# Patient Record
Sex: Male | Born: 1960 | Race: White | Hispanic: No | Marital: Married | Smoking: Never smoker
Health system: Southern US, Community
[De-identification: ages and names within clinical notes are randomized; demographics above are authoritative.]

## PROBLEM LIST (undated history)

## (undated) DIAGNOSIS — Z5189 Encounter for other specified aftercare: Secondary | ICD-10-CM

## (undated) DIAGNOSIS — Q231 Congenital insufficiency of aortic valve: Secondary | ICD-10-CM

## (undated) DIAGNOSIS — I10 Essential (primary) hypertension: Secondary | ICD-10-CM

## (undated) DIAGNOSIS — G4730 Sleep apnea, unspecified: Secondary | ICD-10-CM

## (undated) DIAGNOSIS — Z87442 Personal history of urinary calculi: Secondary | ICD-10-CM

## (undated) DIAGNOSIS — N183 Chronic kidney disease, stage 3 unspecified: Secondary | ICD-10-CM

## (undated) DIAGNOSIS — I480 Paroxysmal atrial fibrillation: Secondary | ICD-10-CM

## (undated) DIAGNOSIS — I491 Atrial premature depolarization: Secondary | ICD-10-CM

## (undated) DIAGNOSIS — R519 Headache, unspecified: Secondary | ICD-10-CM

## (undated) DIAGNOSIS — D649 Anemia, unspecified: Secondary | ICD-10-CM

## (undated) DIAGNOSIS — I499 Cardiac arrhythmia, unspecified: Secondary | ICD-10-CM

## (undated) DIAGNOSIS — I7789 Other specified disorders of arteries and arterioles: Secondary | ICD-10-CM

## (undated) DIAGNOSIS — K219 Gastro-esophageal reflux disease without esophagitis: Secondary | ICD-10-CM

## (undated) DIAGNOSIS — Z992 Dependence on renal dialysis: Secondary | ICD-10-CM

## (undated) DIAGNOSIS — E7800 Pure hypercholesterolemia, unspecified: Secondary | ICD-10-CM

## (undated) DIAGNOSIS — I350 Nonrheumatic aortic (valve) stenosis: Secondary | ICD-10-CM

## (undated) DIAGNOSIS — B019 Varicella without complication: Secondary | ICD-10-CM

## (undated) DIAGNOSIS — N186 End stage renal disease: Secondary | ICD-10-CM

## (undated) DIAGNOSIS — Q2381 Bicuspid aortic valve: Secondary | ICD-10-CM

## (undated) DIAGNOSIS — N200 Calculus of kidney: Secondary | ICD-10-CM

## (undated) DIAGNOSIS — I2510 Atherosclerotic heart disease of native coronary artery without angina pectoris: Secondary | ICD-10-CM

## (undated) HISTORY — DX: Nonrheumatic aortic (valve) stenosis: I35.0

## (undated) HISTORY — DX: Congenital insufficiency of aortic valve: Q23.1

## (undated) HISTORY — DX: Atrial premature depolarization: I49.1

## (undated) HISTORY — DX: Bicuspid aortic valve: Q23.81

## (undated) HISTORY — DX: Chronic kidney disease, stage 3 unspecified: N18.30

## (undated) HISTORY — DX: End stage renal disease: N18.6

## (undated) HISTORY — DX: Calculus of kidney: N20.0

## (undated) HISTORY — DX: Other specified disorders of arteries and arterioles: I77.89

## (undated) HISTORY — DX: Chronic kidney disease, stage 3 (moderate): N18.3

## (undated) HISTORY — DX: Anemia, unspecified: D64.9

## (undated) HISTORY — DX: Varicella without complication: B01.9

## (undated) HISTORY — DX: Encounter for other specified aftercare: Z51.89

## (undated) HISTORY — DX: Paroxysmal atrial fibrillation: I48.0

## (undated) HISTORY — DX: Dependence on renal dialysis: Z99.2

## (undated) HISTORY — DX: Atherosclerotic heart disease of native coronary artery without angina pectoris: I25.10

## (undated) HISTORY — DX: Pure hypercholesterolemia, unspecified: E78.00

## (undated) HISTORY — DX: Sleep apnea, unspecified: G47.30

---

## 2010-02-28 HISTORY — PX: CORONARY STENT PLACEMENT: SHX1402

## 2010-11-15 ENCOUNTER — Inpatient Hospital Stay (HOSPITAL_COMMUNITY)
Admission: EM | Admit: 2010-11-15 | Discharge: 2010-11-18 | DRG: 247 | Disposition: A | Discharge status: Home / Self Care | Payer: Managed Care, Other (non HMO) | Source: Ambulatory Visit | Attending: Cardiology | Admitting: Cardiology

## 2010-11-15 DIAGNOSIS — I2510 Atherosclerotic heart disease of native coronary artery without angina pectoris: Principal | ICD-10-CM | POA: Diagnosis present

## 2010-11-15 DIAGNOSIS — G4733 Obstructive sleep apnea (adult) (pediatric): Secondary | ICD-10-CM | POA: Diagnosis present

## 2010-11-15 DIAGNOSIS — E7800 Pure hypercholesterolemia, unspecified: Secondary | ICD-10-CM | POA: Diagnosis present

## 2010-11-15 DIAGNOSIS — I10 Essential (primary) hypertension: Secondary | ICD-10-CM | POA: Diagnosis present

## 2010-11-15 DIAGNOSIS — H409 Unspecified glaucoma: Secondary | ICD-10-CM | POA: Diagnosis present

## 2010-11-15 DIAGNOSIS — E876 Hypokalemia: Secondary | ICD-10-CM | POA: Diagnosis present

## 2010-11-15 DIAGNOSIS — M109 Gout, unspecified: Secondary | ICD-10-CM | POA: Diagnosis present

## 2010-11-15 HISTORY — DX: Essential (primary) hypertension: I10

## 2010-11-15 LAB — CBC
HCT: 45.7 % (ref 39.0–52.0)
Hemoglobin: 16.1 g/dL (ref 13.0–17.0)
MCHC: 35.2 g/dL (ref 30.0–36.0)
MCV: 93.1 fL (ref 78.0–100.0)
Platelets: 157 10*3/uL (ref 150–400)

## 2010-11-15 LAB — COMPREHENSIVE METABOLIC PANEL
Albumin: 3.8 g/dL (ref 3.5–5.2)
Alkaline Phosphatase: 82 U/L (ref 39–117)
BUN: 20 mg/dL (ref 6–23)
Calcium: 9.4 mg/dL (ref 8.4–10.5)
GFR calc Af Amer: >60 mL/min (ref >60)
Glucose, Bld: 119 mg/dL — ABNORMAL HIGH (ref 70–99)
Potassium: 3.9 mEq/L (ref 3.5–5.1)
Total Protein: 7.2 g/dL (ref 6.0–8.3)

## 2010-11-15 LAB — POCT I-STAT TROPONIN I: Troponin i, poc: 0.03 ng/mL (ref 0.00–0.08)

## 2010-11-16 ENCOUNTER — Observation Stay (HOSPITAL_COMMUNITY): Payer: Managed Care, Other (non HMO)

## 2010-11-16 ENCOUNTER — Encounter (HOSPITAL_COMMUNITY): Payer: Self-pay | Admitting: Radiology

## 2010-11-16 DIAGNOSIS — R079 Chest pain, unspecified: Secondary | ICD-10-CM

## 2010-11-16 LAB — TROPONIN I: Troponin I: <0.3 ng/mL (ref <0.30)

## 2010-11-16 LAB — POCT I-STAT TROPONIN I
Troponin i, poc: 0.02 ng/mL (ref 0.00–0.08)
Troponin i, poc: 0.03 ng/mL (ref 0.00–0.08)

## 2010-11-16 LAB — TSH: TSH: 2.277 u[IU]/mL (ref 0.350–4.500)

## 2010-11-16 LAB — PROTIME-INR: Prothrombin Time: 12.7 seconds (ref 11.6–15.2)

## 2010-11-16 MED ORDER — IOHEXOL 350 MG/ML SOLN
80.0000 mL | Freq: Once | INTRAVENOUS | Status: AC | PRN
  Administered 2010-11-16: 80 mL via INTRAVENOUS

## 2010-11-17 ENCOUNTER — Other Ambulatory Visit (HOSPITAL_COMMUNITY): Payer: Managed Care, Other (non HMO)

## 2010-11-17 DIAGNOSIS — I2510 Atherosclerotic heart disease of native coronary artery without angina pectoris: Secondary | ICD-10-CM

## 2010-11-17 DIAGNOSIS — I059 Rheumatic mitral valve disease, unspecified: Secondary | ICD-10-CM

## 2010-11-17 LAB — COMPREHENSIVE METABOLIC PANEL
AST: 22 U/L (ref 0–37)
Albumin: 3.5 g/dL (ref 3.5–5.2)
BUN: 21 mg/dL (ref 6–23)
Calcium: 8.9 mg/dL (ref 8.4–10.5)
Chloride: 104 mEq/L (ref 96–112)
Creatinine, Ser: 1.41 mg/dL — ABNORMAL HIGH (ref 0.50–1.35)
GFR calc non Af Amer: 53 mL/min — ABNORMAL LOW (ref >60)
Total Bilirubin: 0.3 mg/dL (ref 0.3–1.2)

## 2010-11-17 LAB — CARDIAC PANEL(CRET KIN+CKTOT+MB+TROPI)
Relative Index: INVALID (ref 0.0–2.5)
Total CK: 64 U/L (ref 7–232)
Troponin I: <0.3 ng/mL (ref <0.30)

## 2010-11-17 LAB — CBC
MCV: 93 fL (ref 78.0–100.0)
Platelets: 168 10*3/uL (ref 150–400)
RBC: 4.87 MIL/uL (ref 4.22–5.81)
RDW: 12.9 % (ref 11.5–15.5)
WBC: 6.4 10*3/uL (ref 4.0–10.5)

## 2010-11-17 LAB — LIPID PANEL
Cholesterol: 180 mg/dL (ref 0–200)
HDL: 30 mg/dL — ABNORMAL LOW (ref >39)
Triglycerides: 661 mg/dL — ABNORMAL HIGH (ref <150)
VLDL: UNDETERMINED mg/dL (ref 0–40)

## 2010-11-17 LAB — HEMOGLOBIN A1C
Hgb A1c MFr Bld: 5.7 % — ABNORMAL HIGH (ref <5.7)
Mean Plasma Glucose: 117 mg/dL — ABNORMAL HIGH (ref <117)

## 2010-11-18 DIAGNOSIS — R0989 Other specified symptoms and signs involving the circulatory and respiratory systems: Secondary | ICD-10-CM

## 2010-11-18 DIAGNOSIS — I200 Unstable angina: Secondary | ICD-10-CM

## 2010-11-18 LAB — BASIC METABOLIC PANEL
BUN: 17 mg/dL (ref 6–23)
CO2: 25 mEq/L (ref 19–32)
Calcium: 9.2 mg/dL (ref 8.4–10.5)
Chloride: 105 mEq/L (ref 96–112)
Creatinine, Ser: 1.23 mg/dL (ref 0.50–1.35)

## 2010-11-18 LAB — CBC
HCT: 46.3 % (ref 39.0–52.0)
MCH: 31.9 pg (ref 26.0–34.0)
MCV: 91.1 fL (ref 78.0–100.0)
RBC: 5.08 MIL/uL (ref 4.22–5.81)
WBC: 8.3 10*3/uL (ref 4.0–10.5)

## 2010-11-25 ENCOUNTER — Ambulatory Visit (INDEPENDENT_AMBULATORY_CARE_PROVIDER_SITE_OTHER): Payer: Managed Care, Other (non HMO) | Admitting: *Deleted

## 2010-11-25 DIAGNOSIS — I10 Essential (primary) hypertension: Secondary | ICD-10-CM

## 2010-11-26 ENCOUNTER — Telehealth: Payer: Self-pay | Admitting: *Deleted

## 2010-11-26 DIAGNOSIS — I10 Essential (primary) hypertension: Secondary | ICD-10-CM

## 2010-11-26 DIAGNOSIS — I2510 Atherosclerotic heart disease of native coronary artery without angina pectoris: Secondary | ICD-10-CM

## 2010-11-26 LAB — BASIC METABOLIC PANEL
BUN: 27 mg/dL — ABNORMAL HIGH (ref 6–23)
CO2: 25 mEq/L (ref 19–32)
Calcium: 8.8 mg/dL (ref 8.4–10.5)
Creatinine, Ser: 1.6 mg/dL — ABNORMAL HIGH (ref 0.4–1.5)
GFR: 49.64 mL/min — ABNORMAL LOW (ref >60.00)
Glucose, Bld: 92 mg/dL (ref 70–99)

## 2010-11-26 NOTE — Telephone Encounter (Signed)
Med change. See lab note.

## 2010-12-03 ENCOUNTER — Encounter: Payer: Self-pay | Admitting: Physician Assistant

## 2010-12-06 ENCOUNTER — Ambulatory Visit (INDEPENDENT_AMBULATORY_CARE_PROVIDER_SITE_OTHER): Payer: Managed Care, Other (non HMO) | Admitting: Pulmonary Disease

## 2010-12-06 ENCOUNTER — Encounter: Payer: Managed Care, Other (non HMO) | Admitting: Physician Assistant

## 2010-12-06 ENCOUNTER — Encounter: Payer: Self-pay | Admitting: Physician Assistant

## 2010-12-06 ENCOUNTER — Ambulatory Visit (INDEPENDENT_AMBULATORY_CARE_PROVIDER_SITE_OTHER): Payer: Managed Care, Other (non HMO) | Admitting: Physician Assistant

## 2010-12-06 ENCOUNTER — Encounter: Payer: Self-pay | Admitting: Pulmonary Disease

## 2010-12-06 VITALS — BP 128/78 | HR 46 | Temp 98.0°F | Ht 68.0 in | Wt 249.0 lb

## 2010-12-06 DIAGNOSIS — H409 Unspecified glaucoma: Secondary | ICD-10-CM | POA: Insufficient documentation

## 2010-12-06 DIAGNOSIS — I498 Other specified cardiac arrhythmias: Secondary | ICD-10-CM

## 2010-12-06 DIAGNOSIS — M109 Gout, unspecified: Secondary | ICD-10-CM | POA: Insufficient documentation

## 2010-12-06 DIAGNOSIS — R001 Bradycardia, unspecified: Secondary | ICD-10-CM | POA: Insufficient documentation

## 2010-12-06 DIAGNOSIS — N289 Disorder of kidney and ureter, unspecified: Secondary | ICD-10-CM

## 2010-12-06 DIAGNOSIS — E782 Mixed hyperlipidemia: Secondary | ICD-10-CM | POA: Insufficient documentation

## 2010-12-06 DIAGNOSIS — I10 Essential (primary) hypertension: Secondary | ICD-10-CM

## 2010-12-06 DIAGNOSIS — I2510 Atherosclerotic heart disease of native coronary artery without angina pectoris: Secondary | ICD-10-CM | POA: Insufficient documentation

## 2010-12-06 DIAGNOSIS — G4733 Obstructive sleep apnea (adult) (pediatric): Secondary | ICD-10-CM

## 2010-12-06 HISTORY — DX: Essential (primary) hypertension: I10

## 2010-12-06 NOTE — Progress Notes (Signed)
  Subjective:    Patient ID: Daniel Solomon, male    DOB: 11/25/1960, 50 y.o.   MRN: [id number]  HPI The patient is a 50 year old male who I've been asked to see for possible sleep apnea.  He was recently in the hospital for coronary disease, and is status post stenting.  While there, he had issues with witnessed apnea, and tells me that he "set off the alarms".  The patient has been noted to have loud snoring by his bed partner, and has had rare gasping arousals as well.  He has frequent awakenings at night, and does not feel rested in the mornings upon arising.  He has some sleep pressure with intactivity during the day, and feels that his overall alertness at work is not normal.  He has no issues with sleepiness in the evenings while watching television, but does have sleepiness issues with driving the [id number] hours to work each way.  The patient states that his weight is neutral the last 2 years, and his Epworth sleepiness score today is 11  Sleep Questionnaire: What time do you typically go to bed?( Between what hours) 9-11pm How long does it take you to fall asleep? 15 minutes How many times during the night do you wake up? 4 What time do you get out of bed to start your day? 0630 Do you drive or operate heavy machinery in your occupation? No How much has your weight changed (up or down) over the past two years? (In pounds) 0 oz (0 kg) Have you ever had a sleep study before? No Do you currently use CPAP? No Do you wear oxygen at any time? No    Review of Systems  Constitutional: Negative for fever and unexpected weight change.  HENT: Positive for sneezing. Negative for ear pain, nosebleeds, congestion, sore throat, rhinorrhea, trouble swallowing, dental problem, postnasal drip and sinus pressure.   Eyes: Negative for redness and itching.  Respiratory: Positive for cough. Negative for chest tightness, shortness of breath and wheezing.   Cardiovascular: Negative for palpitations and leg swelling.    Gastrointestinal: Negative for nausea and vomiting.  Genitourinary: Negative for dysuria.  Musculoskeletal: Negative for joint swelling.  Skin: Negative for rash.  Neurological: Negative for headaches.  Hematological: Does not bruise/bleed easily.  Psychiatric/Behavioral: Negative for dysphoric mood. The patient is not nervous/anxious.        Objective:   Physical Exam Constitutional:  Overweight male, no acute distress  HENT:  Nares patent without discharge  Oropharynx without exudate, palate and uvula are thick and elongated.  Small posterior   pharyngeal space  Eyes:  Perrla, eomi, no scleral icterus  Neck:  No JVD, no TMG  Cardiovascular:  Normal rate, regular rhythm, no rubs or gallops.  No murmurs        Intact distal pulses  Pulmonary :  Normal breath sounds, no stridor or respiratory distress   No rales, rhonchi, or wheezing  Abdominal:  Soft, nondistended, bowel sounds present.  No tenderness noted.   Musculoskeletal:  No lower extremity edema noted.  Lymph Nodes:  No cervical lymphadenopathy noted  Skin:  No cyanosis noted  Neurologic:  Awake, appears sleepy but appropriate, moves all 4 extremities without obvious deficit.         Assessment & Plan:

## 2010-12-06 NOTE — Assessment & Plan Note (Signed)
Repeat basic metabolic panel today.  He may need Lisinopril discontinued if his creatinine continues to rise.

## 2010-12-06 NOTE — Assessment & Plan Note (Signed)
He was evaluated by pulmonology today.

## 2010-12-06 NOTE — Assessment & Plan Note (Signed)
Controlled.  

## 2010-12-06 NOTE — Assessment & Plan Note (Signed)
Currently asymptomatic.  Decrease bisoprolol to 2.5 mg daily.

## 2010-12-06 NOTE — Progress Notes (Addendum)
History of Present Illness: Primary Cardiologist:  Dr. Jenell Milliner   Daniel Solomon is a 50 y.o. male who presents for post hospital follow up.  He has a history of hypertension and hyperlipidemia.  He was admitted 9/17-9/20.  He presented with chest and arm pain consistent with unstable angina.  Myocardial infarction was ruled out.  Cardiac CT angiogram in the emergency room demonstrated a very high calcium score with possible significant LAD and RCA stenoses.  He was referred for cardiac catheterization.  This was performed 9/19 and demonstrated proximal LAD 50%, mid LAD 50-60%, mid to distal LAD 99%; mid circumflex 20%; proximal RCA 20%, distal RCA 50%; PDA 60-70%, proximal posterior AV groove 50%, proximal posterior lateral 50%.  There was a question of possible LVOT gradient.  Decision was made to proceed with PCI of the mid to distal LAD with a Resolute DES.  Followup echocardiogram 11/17/10: EF 55-60%, mild LVH, grade 1 diastolic dysfunction, normal aortic valve, mild MR, PASP 32.  He was noted to have nocturnal bradycardia and was set up for outpatient sleep medicine evaluation for possible sleep apnea.  His triglycerides were over 600.  Lipitor was added to his medications which included Lopid.  He had some renal insufficiency with creatinine of 1.43 upon presentation.  Repeat basic metabolic panel 0000000 post discharge demonstrated a potassium of 4.9 and a creatinine of 1.6.  Of note, hemoglobin A1c was 5.7 in the hospital and TSH was 2.277 and hemoglobin was 16.2.  The patient denies chest pain, shortness of breath, syncope, orthopnea, PND or significant pedal edema.  He saw Dr. Gwenette Greet today and has a sleep test scheduled soon.  He starts cardiac rehab next week.   Past Medical History  Diagnosis Date  . Hypertension   . Hypercholesterolemia     With hypertriglyceridemia  . Glaucoma   . Kidney stones   . Gout   . CAD (coronary artery disease)     proximal LAD 50%, mid LAD 50-60%, mid to distal  LAD 99%; mid circumflex 20%; proximal RCA 20%, distal RCA 50%; PDA 60-70%, proximal posterior AV groove 50%, proximal posterior lateral 50%.  There was a question of possible LVOT gradient;  s/p Resolute DES to mid-distal LAD 10/2010;  echocardiogram 11/17/10: EF 55-60%, mild LVH, grade 1 diastolic dysfunction, normal aortic valve, mild MR, PASP 32   . Renal insufficiency     Current Outpatient Prescriptions  Medication Sig Dispense Refill  . allopurinol (ZYLOPRIM) 300 MG tablet Take 300 mg by mouth daily.        [name] aspirin 81 MG tablet Take 81 mg by mouth daily.        [name] atorvastatin (LIPITOR) 40 MG tablet Take 40 mg by mouth daily.        . bimatoprost (LUMIGAN) 0.03 % ophthalmic solution Place 1 drop into both eyes at bedtime.        . bisoprolol (ZEBETA) 5 MG tablet Take 1/2 tablet daily      . gemfibrozil (LOPID) 600 MG tablet Take 600 mg by mouth 2 (two) times daily before a meal.        . lisinopril (PRINIVIL,ZESTRIL) 10 MG tablet Take 1/2 tablet by mouth once daily.      . prasugrel (EFFIENT) 10 MG TABS Take 10 mg by mouth daily.        . timolol (TIMOPTIC-XR) 0.25 % ophthalmic gel-forming 1 drop daily.        [name] DISCONTD: bisoprolol (ZEBETA) 5 MG tablet Take  5 mg by mouth daily.          Allergies: No Known Allergies  Social history:  Nonsmoker  Vital Signs: BP 122/80  Pulse 48  Ht 5\' 8"  (1.727 m)  Wt 248 lb 8 oz (112.719 kg)  BMI 37.78 kg/m2  PHYSICAL EXAM: Well nourished, well developed, in no acute distress HEENT: normal Neck: no JVD Vascular: No carotid bruits Cardiac:  normal S1, S2; RRR; 1/6 systolic murmur Along left sternal border Lungs:  clear to auscultation bilaterally, no wheezing, rhonchi or rales Abd: soft, nontender, no hepatomegaly Ext: no edema; Right radial site without hematoma or bruit Skin: warm and dry Neuro:  CNs 2-12 intact, no focal abnormalities noted Psych: Normal affect  EKG:  Sinus bradycardia, rate 44, normal axis, no ischemic  changes  ASSESSMENT AND PLAN:

## 2010-12-06 NOTE — Patient Instructions (Signed)
Decrease bisoprolol to 2.5mg  daily--this will be one-half of a 5mg  tablet daily.  Your physician recommends that you have lab work today--BMP 414.01  593.9  401.1  272.4  Your physician recommends that you return for a FASTING lipid profile/liver profile in 6 weeks---414.01  593.9  401.1  272.4  Your physician wants you to follow-up in: 3 months with Dr Verl Blalock. (January 2013). You will receive a reminder letter in the mail two months in advance. If you don't receive a letter, please call our office to schedule the follow-up appointment.

## 2010-12-06 NOTE — Assessment & Plan Note (Signed)
The patient's history is very suggestive of obstructive sleep apnea.  He has loud snoring at night, witnessed abnormal breathing pattern during sleep, frequent awakenings at night with nonrestorative sleep, and also daytime sleep pressure with periods of inactivity.  I have had a long discussion with the pt about sleep apnea, including its impact on QOL and CV health.  I have recommended proceeding with a sleep study, and the patient is agreeable.

## 2010-12-06 NOTE — Patient Instructions (Signed)
Will schedule for sleep study Will arrange followup to discuss results once available.  Work on weight reduction.

## 2010-12-06 NOTE — Assessment & Plan Note (Signed)
Triglycerides extremely high in the hospital.  He is now on Lipitor and gemfibrozil.  Followup lipids and LFTs in 6 weeks.

## 2010-12-06 NOTE — Assessment & Plan Note (Signed)
Overall stable post PCI.  Continue aspirin and Effient and statin.  He starts cardiac rehabilitation next week.  Followup with Dr. Verl Blalock in 3 months.

## 2010-12-07 LAB — BASIC METABOLIC PANEL
BUN: 24 mg/dL — ABNORMAL HIGH (ref 6–23)
Chloride: 110 mEq/L (ref 96–112)
Creatinine, Ser: 1.5 mg/dL (ref 0.4–1.5)

## 2010-12-13 ENCOUNTER — Encounter (HOSPITAL_COMMUNITY): Payer: Managed Care, Other (non HMO) | Attending: Cardiology

## 2010-12-13 ENCOUNTER — Encounter (HOSPITAL_COMMUNITY): Payer: Managed Care, Other (non HMO)

## 2010-12-13 DIAGNOSIS — Z5189 Encounter for other specified aftercare: Secondary | ICD-10-CM | POA: Insufficient documentation

## 2010-12-13 DIAGNOSIS — M109 Gout, unspecified: Secondary | ICD-10-CM | POA: Insufficient documentation

## 2010-12-13 DIAGNOSIS — I2510 Atherosclerotic heart disease of native coronary artery without angina pectoris: Secondary | ICD-10-CM | POA: Insufficient documentation

## 2010-12-13 DIAGNOSIS — Z9861 Coronary angioplasty status: Secondary | ICD-10-CM | POA: Insufficient documentation

## 2010-12-13 DIAGNOSIS — G4733 Obstructive sleep apnea (adult) (pediatric): Secondary | ICD-10-CM | POA: Insufficient documentation

## 2010-12-13 DIAGNOSIS — E7800 Pure hypercholesterolemia, unspecified: Secondary | ICD-10-CM | POA: Insufficient documentation

## 2010-12-13 DIAGNOSIS — H409 Unspecified glaucoma: Secondary | ICD-10-CM | POA: Insufficient documentation

## 2010-12-13 DIAGNOSIS — I10 Essential (primary) hypertension: Secondary | ICD-10-CM | POA: Insufficient documentation

## 2010-12-14 NOTE — Cardiovascular Report (Signed)
NAME:  Daniel Solomon, Daniel Solomon NO.:  [id number]  MEDICAL RECORD NO.:  [id number]  LOCATION:  2507                         FACILITY:  [facility]  PHYSICIAN:  Kathlyn Sacramento, MD     DATE OF BIRTH:  1960-11-25  DATE OF PROCEDURE: DATE OF DISCHARGE:                           CARDIAC CATHETERIZATION   REFERRING PHYSICIAN:  Marcello Moores C. Wall, MD, [facility]  PROCEDURES PERFORMED: 1. Left heart catheterization 2. Coronary angiography. 3. Left anterior descending artery angioplasty and drug-eluting stent     placement to a 99% stenosis in the mid to distal location resulting     in 0% residual stenosis.  INDICATIONS AND CLINICAL HISTORY:  This is a 50 year old gentleman with past medical history of hyperlipidemia and family history of premature coronary artery disease, who presented with substernal chest tightness with activities.  He had a CTA of the chest which showed evidence of LAD and RCA disease, and thus was referred for cardiac catheterization. Risks, benefits and alternatives were discussed with the patient.  ACCESS:  Right radial artery with 5-French sheath.  STUDY DETAILS:  A standard informed consent was obtained.  The right radial area was prepped in a sterile fashion.  It was anesthetized with 1% lidocaine.  A 5-French sheath was placed in the right radial artery after anterior puncture.  Verapamil 3 mg was given through the sheath. Unfractionated heparin 5000 units was given intravenously.  Coronary angiography was performed with a Tig catheter which could not engage the right coronary artery.  Right Judkins catheter was used for that purpose.  The catheter also was used to cross the aortic valve and record pressure.  No left ventricular angiography was performed.  INTERVENTIONAL PROCEDURE NOTE:  The patient was noted to have significant spasm in the radial artery resulting in difficulty in torquing the catheters.  Thus, he was given more verapamil and nitroglycerin  through the sheath as well as more sedation.  Even with that, there was still significant difficulty manipulating the guiding catheter.  I decided to stay with a 5-French sheath system.  I used an EBU 3.5 guiding catheter.  The lesion in the left anterior descending artery was crossed with an intuition wire.  Before that, the patient was given 60 mg of Effient and was started an IV bivalirudin with therapeutic ACT.  The lesion was predilated with a 2.0 x 15 mm Sprinter balloon to 8 atmospheres and then 10 atmospheres proximally.  I then placed a 2.25 x 18 mm resolute integrity stent which was deployed to 14 atmospheres.  This was postdilated with an [place] sprinter 2.5 x 12 mm to 12 atmospheres x2 inflations.  Angiography showed excellent results.  The wire was removed.  The guiding catheter was removed over the wire.  The sheath was removed and a TR band was applied.  There was no immediate complications.  STUDY FINDINGS:  Hemodynamic findings:  Left ventricular pressure is 151/9 with a left ventricular end-diastolic pressure of 12 mmHg.  Aortic pressure of 142/80 with a mean pressure of 106 mmHg.  There was a mild gradient noted across the aortic valve which could not be determined whether it was due to an LVOT gradient.  Left ventricle angiography was not performed due to slightly elevated creatinine.  Coronary angiography: 1. Left main coronary artery:  The vessel is normal in size and free     of significant disease. 2. Left anterior descending artery:  The vessel is normal in size with     disease noted throughout its course.  Proximally, just before the     first septal branch, there is 50% stenosis.  In the mid segment     right after second diagonal, there is another 50%-60% eccentric     lesion.  In the mid-to-distal LAD before the third diagonal, there     is a 99% tubular stenosis.  First diagonal is overall small in size     and free of significant disease.  Second diagonal  is normal in size     and free of significant disease.  Third diagonal is very small in     size. 3. Left circumflex artery:  The vessel is normal in size but     nondominant.  It has minor irregularities without obstructive     disease.  There is a 20% lesion in the mid segment right after OM-     1. 4. Right coronary artery:  The vessel is very large in size and     dominant.  There is a 20% proximal stenosis.  There is minor     irregularities in the mid segment.  Distally, right at PDA and the     AV groove bifurcation, there is a 50% stenosis.  The PDA itself is     medium in size and diffusely diseased throughout its course with     60%-70% disease noted with plaque formation.  The distal area is     too small.  The posterior AV groove has a 50% proximal lesion and     it gives a large posterolateral branch which has a 50% proximal     stenosis.  The second posterolateral branch is small in size and     free of significant disease.  STUDY CONCLUSION: 1. Significant two-vessel coronary artery disease. 2. Normal LV end-diastolic pressure with mild gradient noted across     the aortic valve. 3. Successful angioplasty and drug-eluting stent placement to the mid-     to-distal LAD with placement of 2.25 x 18 mm resolute integrity     drug-eluting stent.  RECOMMENDATIONS: 1. I recommend an echocardiogram to evaluate LV systolic function and     to see if there is true gradient across the aortic valve or the     LVOT. 2. I recommend dual antiplatelet therapy with aspirin and Effient for     at least 1 year. 3. Aggressive treatment of risk factors with high-dose statin is     recommended to treat his residual coronary artery disease.  No     further revascularization is required at this time, but obviously     the patient has diffuse coronary artery disease.     Kathlyn Sacramento, MD     MA/MEDQ  D:  11/17/2010  T:  11/17/2010  Job:  [id number]  Electronically Signed by Kathlyn Sacramento MD on 12/14/2010 12:25:51 PM

## 2010-12-15 ENCOUNTER — Encounter (HOSPITAL_COMMUNITY): Payer: Managed Care, Other (non HMO)

## 2010-12-16 ENCOUNTER — Ambulatory Visit (INDEPENDENT_AMBULATORY_CARE_PROVIDER_SITE_OTHER): Payer: Managed Care, Other (non HMO) | Admitting: Pulmonary Disease

## 2010-12-16 DIAGNOSIS — G4733 Obstructive sleep apnea (adult) (pediatric): Secondary | ICD-10-CM

## 2010-12-17 ENCOUNTER — Encounter (HOSPITAL_COMMUNITY): Payer: Managed Care, Other (non HMO)

## 2010-12-19 NOTE — H&P (Signed)
NAME:  Daniel Solomon, Daniel Solomon NO.:  [id number]  MEDICAL RECORD NO.:  [id number]  LOCATION:  2507                         FACILITY:  [facility]  PHYSICIAN:  Marijo Conception. Sahvannah Rieser, MD, FACCDATE OF BIRTH:  12/08/1960  DATE OF ADMISSION:  11/15/2010 DATE OF DISCHARGE:                             HISTORY & PHYSICAL   PRIMARY CARE PHYSICIAN:  Domenick Gong, MD  PRIMARY CARDIOLOGIST:  None.  CHIEF COMPLAINT:  Chest pain.  HISTORY OF PRESENT ILLNESS:  Daniel Solomon is a 50 year old male with no previous history of coronary artery disease.  He has multiple cardiac risk factors.  On the day of admission, he had 3 episodes of substernal chest pain.  Each one started with exertion.  The pain radiated down both arms.  It was described as a pressure.  He denies shortness of breath, nausea, vomiting or diaphoresis.  His symptoms were relieved by rest each time in less than 15 minutes.  He came to the emergency room because of the multiple episodes.  His cardiac enzymes were negative for MI.  Initially, a stress echocardiogram was planned but his blood pressure was significantly elevated and he had been given a beta- blocker, so it was not felt the most appropriate test.  The cardiac CT was performed which showed possible significant coronary artery disease, so Cardiology was asked to evaluate him.  Daniel Solomon has had no episodes of chest pain that started at rest.  Currently, he is resting comfortably.  PAST MEDICAL HISTORY: 1. Hypertension. 2. Hyperlipidemia. 3. History of glaucoma. 4. History of nephrolithiasis. 5. Gout.  SURGICAL HISTORY:  None known.  ALLERGIES:  No known drug allergies.  CURRENT MEDICATIONS: 1. Allopurinol 300 mg daily. 2. Gemfibrozil 600 mg b.i.d. 3. Zocor 40 mg a day. 4. Timolol eye drops daily. 5. Ziac 5/6.25 daily.  SOCIAL HISTORY:  He lives in [place] with his wife.  He works as a Scientist, research (physical sciences) at [employer].  He has no history of alcohol, tobacco or  drug abuse.  He walks for exercise at least  for about 15-20 minutes and eats a reasonably heart-healthy diet.  FAMILY HISTORY:  His mother is alive at 53 with a history of valvular disease.  His father is alive at 92 with a history of bypass surgery and no siblings have cardiac issues.  REVIEW OF SYSTEMS:  He has not had any recent illnesses, fevers, or chills.  The chest pain is described above.  He has had a nonproductive cough for a couple of weeks that is not completely gone but denies wheezing.  He denies orthopnea, PND, or edema.  He has not had palpitations.  He has occasional arthralgias.  He denies reflux symptoms or melena.  Full 14-point review of systems is otherwise negative except as stated in the HPI.  PHYSICAL EXAMINATION:  VITAL SIGNS:  Temperature is 98.4, initial blood pressure 197/103, now 147/75, heart rate 44, respiratory rate 20, O2 saturation 95% on room air. GENERAL:  He is a well-developed, well-nourished white male in no acute distress. HEENT:  Normal. NECK:  There is no lymphadenopathy, thyromegaly or JVD noted, but he has a right carotid bruit noted. CV:  His heart  is regular in rate and rhythm with an S1-S2 and no significant murmur, rub or gallop is noted.  Distal pulses are intact in all four extremities. LUNGS:  Clear to auscultation bilaterally. SKIN:  No rashes or lesions are noted. ABDOMEN:  Soft and nontender with active bowel sounds. EXTREMITIES:  There is no cyanosis, clubbing or edema noted. MUSCULOSKELETAL:  There is no joint deformity or effusions and no spine or CVA tenderness. NEURO:  He is alert and oriented.  Cranial nerves II-XII are grossly intact.  IMAGING:  Cardiac CT shows potentially obstructive coronary artery disease with a calcium score of 142 and potentially hemodynamically significant stenosis within the LAD and distal RCA.  EKG is sinus bradycardia rate 44 with no acute ischemic changes.  LABORATORY VALUES:   Hemoglobin 16.1, hematocrit 45.7, WBCs 5.9, platelets 157.  Sodium 143, potassium 3.9, chloride 105, CO2 30, BUN 20, creatinine 1.43, glucose 119.  Point-of-care troponin is negative x3.  IMPRESSION:  Daniel Solomon was seen today by Dr. Verl Blalock, the patient evaluated and the data reviewed.  Daniel Solomon has exertional chest pain consistent with angina.  Cardiac CT showed significant coronary artery disease in the left anterior descending and distal right coronary artery.  PLAN:  Hydrate with normal saline overnight and recheck a BUN and creatinine in a.m. because of the contrast load from the cardiac CT.  If his BUN and creatinine are stable, we will do a heart catheterization with cors only and check a 2-D echocardiogram for left ventricular function.  Dr. Verl Blalock discussed the plan with the patient and his wife including the risks and benefits of the procedure and they agreed to proceed.     Rosaria Ferries, PA-C   ______________________________ Marijo Conception Verl Blalock, MD, [facility]    RB/MEDQ  D:  11/16/2010  T:  11/17/2010  Job:  [id number]  Electronically Signed by Rosaria Ferries PA-C on 12/09/2010 06:38:09 AM Electronically Signed by Jenell Milliner MD [facility] on 12/19/2010 01:46:39 PM

## 2010-12-19 NOTE — Discharge Summary (Signed)
NAME:  Daniel Solomon, Daniel Solomon NO.:  [id number]  MEDICAL RECORD NO.:  [id number]  LOCATION:  2507                         FACILITY:  [facility]  PHYSICIAN:  Marijo Conception. Hamed Debella, MD, FACCDATE OF BIRTH:  1960/12/16  DATE OF ADMISSION:  11/15/2010 DATE OF DISCHARGE:  11/18/2010                              DISCHARGE SUMMARY   PRIMARY CARDIOLOGIST:  Marcello Moores C. Stephenson Cichy, MD, [facility].  PRIMARY CARE PROVIDER:  Franky Macho  DISCHARGE DIAGNOSIS:  Unstable angina.  SECONDARY DIAGNOSES: 1. Coronary artery disease status post drug-eluting stent placement of     the mid-to-distal left anterior descending artery this admission. 2. Hypertension. 3. Hyperlipidemia. 4. Hypertriglyceridemia. 5. Glaucoma. 6. Gout. 7. History of nephrolithiasis. 8. Hypokalemia on presentation. 9. Probable sleep apnea with outpatient workup pending.  ALLERGIES:  No known drug allergies.  PROCEDURES: 1. Left heart cardiac catheterization performed on November 16, 2010,     revealing a 99% stenosis in the mid-to-distal LAD with otherwise     nonobstructive disease.  The LAD was successfully stented using a     2.25 x 18-mm Resolute Integrity drug-eluting stent. 2. A 2-D echocardiogram on November 17, 2010; EF 55%-60% with mild     LVH, normal Meli Faley motion, and grade 1 diastolic dysfunction.  Mild     mitral regurgitation.  PASP was 32 mmHg.  HISTORY OF PRESENT ILLNESS:  This is a 50 year old male with the above problem list, who was in his usual state of health until the day of admission when he had 3 episodes of exertional chest discomfort with bilateral arm radiation with each episode lasting approximately 15 minutes resolved with rest.  He presented to the [facility] ED where his cardiac enzymes were negative.  He was felt to be a low risk by ER provider standpoint and was enrolled in a chest pain protocol, which called for cardiac CT angio of his chest showing significant, potentially obstructive coronary  artery disease involving the LAD and distal right coronary artery.  Total calcium score is 142.  Over this finding, [facility] was called for evaluation and admission.  HOSPITAL COURSE:  The patient ruled out for MI.  No further chest discomfort.  Decision was made to pursue diagnostic catheterization which took place on November 17, 2010, revealing a 99% stenosis in the distal LAD, otherwise moderate nonobstructive CAD including multiple 50%- 60% stenoses throughout the LAD and distal right coronary artery as well as diffuse 60%-70% stenosis in the right PDA.  The LAD was felt to be the culprit vessel.  This was successfully stented with a 2.25 x 18-mm Resolute Integrity drug-eluting stent.  The patient tolerated this procedure well.  Postprocedure, she was noted to be hypertensive, requiring p.r.n. IV hydralazine.  With lowering of blood pressure to the 130 range (from 170s-180s), the patient did experience some nausea and diaphoresis, which subsequently resolved with IV Zofran.  We have added moderate-dose lisinopril to his home regimen of bisoprolol, and we will plan to continue to titrate his medications as needed for hypertension in the outpatient setting.  It was noted during this admission that the patient exhibited nocturnal bradycardia.  Upon questioning, his wife reported a history of  snoring and the patient also noted frequent spells of waking up suddenly feeling as though he cannot catch his breath.  We felt that based on these symptoms along with the patient's body habitus, it was likely that the patient has sleep apnea.  We have arranged for followup with Dr. Gwenette Greet in Pulmonology/Sleep Clinic on December 06, 2010.  DISCHARGE LABORATORY DATA:  Hemoglobin 16.2, hematocrit 46.3, WBC 8.3, platelets 158.  Sodium 140, potassium 3.4 (replaced prior to discharge), chloride 105, CO2 of 25, BUN 17, creatinine 1.23, glucose 109.  Total bilirubin 0.3, alkaline phosphatase  82, AST 22, ALT 30, total protein 6.5, albumin 3.5, calcium 9.2.  Hemoglobin A1c 5.7.  CK 64, MB 2.2, troponin I less than 0.30, total cholesterol 180, triglycerides 661, HDL 30, LDL not calculated.  TSH 2.27.  DISPOSITION:  The patient will be discharged home today in good condition.  FOLLOWUP PLANS AND APPOINTMENTS:  The patient will have a basic metabolic panel checked on November 25, 2010, at [facility] secondary to initiation of ACE inhibitor therapy.  He has a followup with Richardson Dopp, PA on December 06, 2010, at 11 a.m. and then followup with Dr. Gwenette Greet on the same day at 1:30 p.m.  He will follow up with his primary care provider as previously scheduled.  DISCHARGE MEDICATIONS: 1. Aspirin 81 mg daily. 2. Bisoprolol 5 mg daily. 3. Lipitor 40 mg at bedtime. 4. Lisinopril 10 mg daily. 5. Nitroglycerin 0.4 mg subcu p.r.n. chest pain. 6. Prilosec 10 mg daily. 7. Allopurinol 300 mg q.p.m. 8. Gemfibrozil 600 mg b.i.d. 9. Lumigan 1 drop both eyes at bedtime. 10.Timolol 0.5% 1 drop both eyes daily.  OUTSTANDING LABS AND STUDIES:  A followup BMET in 1 week and lipids and LFTs in 6-8 weeks.  Followup sleep study at the discretion of Dr. Gwenette Greet.  DURATION OF DISCHARGE ENCOUNTER:  Sixty minutes including physician time.     Murray Hodgkins, ANP   ______________________________ Marijo Conception. Verl Blalock, MD, [facility]    CB/MEDQ  D:  11/18/2010  T:  11/18/2010  Job:  [id number]  cc:   Linward Headland, MD,FCCP  Electronically Signed by Murray Hodgkins ANP on 11/23/2010 04:20:20 PM Electronically Signed by Jenell Milliner MD [facility] on 12/19/2010 01:46:37 PM

## 2010-12-20 ENCOUNTER — Encounter (HOSPITAL_COMMUNITY): Payer: Managed Care, Other (non HMO)

## 2010-12-22 ENCOUNTER — Encounter (HOSPITAL_COMMUNITY): Payer: Managed Care, Other (non HMO)

## 2010-12-24 ENCOUNTER — Telehealth: Payer: Self-pay | Admitting: *Deleted

## 2010-12-24 ENCOUNTER — Encounter (HOSPITAL_COMMUNITY): Payer: Managed Care, Other (non HMO)

## 2010-12-24 NOTE — Telephone Encounter (Signed)
Pt needs ov with KC to discuss sleep study results LMOM for pt TCB

## 2010-12-27 ENCOUNTER — Encounter (HOSPITAL_COMMUNITY): Payer: Managed Care, Other (non HMO)

## 2010-12-27 NOTE — Telephone Encounter (Signed)
ATC pt x 2.  Phone rang several times and then hung up.  WCB

## 2010-12-28 NOTE — Telephone Encounter (Signed)
LMOMTCB

## 2010-12-28 NOTE — Telephone Encounter (Signed)
Pt appt w/ [facility] all ready set for 11/19.  Pt happy with date.  Satira Anis

## 2010-12-29 ENCOUNTER — Encounter (HOSPITAL_COMMUNITY): Payer: Managed Care, Other (non HMO)

## 2010-12-31 ENCOUNTER — Encounter (HOSPITAL_COMMUNITY): Payer: Managed Care, Other (non HMO)

## 2010-12-31 DIAGNOSIS — G4733 Obstructive sleep apnea (adult) (pediatric): Secondary | ICD-10-CM | POA: Insufficient documentation

## 2010-12-31 DIAGNOSIS — Z5189 Encounter for other specified aftercare: Secondary | ICD-10-CM | POA: Insufficient documentation

## 2010-12-31 DIAGNOSIS — I2510 Atherosclerotic heart disease of native coronary artery without angina pectoris: Secondary | ICD-10-CM | POA: Insufficient documentation

## 2010-12-31 DIAGNOSIS — E7800 Pure hypercholesterolemia, unspecified: Secondary | ICD-10-CM | POA: Insufficient documentation

## 2010-12-31 DIAGNOSIS — H409 Unspecified glaucoma: Secondary | ICD-10-CM | POA: Insufficient documentation

## 2010-12-31 DIAGNOSIS — Z9861 Coronary angioplasty status: Secondary | ICD-10-CM | POA: Insufficient documentation

## 2010-12-31 DIAGNOSIS — I10 Essential (primary) hypertension: Secondary | ICD-10-CM | POA: Insufficient documentation

## 2010-12-31 DIAGNOSIS — M109 Gout, unspecified: Secondary | ICD-10-CM | POA: Insufficient documentation

## 2011-01-03 ENCOUNTER — Encounter (HOSPITAL_COMMUNITY): Payer: Managed Care, Other (non HMO)

## 2011-01-05 ENCOUNTER — Encounter (HOSPITAL_COMMUNITY): Payer: Managed Care, Other (non HMO)

## 2011-01-07 ENCOUNTER — Encounter (HOSPITAL_COMMUNITY): Payer: Managed Care, Other (non HMO)

## 2011-01-10 ENCOUNTER — Encounter (HOSPITAL_COMMUNITY): Payer: Managed Care, Other (non HMO)

## 2011-01-12 ENCOUNTER — Encounter (HOSPITAL_COMMUNITY): Payer: Managed Care, Other (non HMO)

## 2011-01-14 ENCOUNTER — Encounter (HOSPITAL_COMMUNITY)
Admission: RE | Admit: 2011-01-14 | Discharge: 2011-01-14 | Disposition: A | Discharge status: Home / Self Care | Payer: Managed Care, Other (non HMO) | Source: Ambulatory Visit | Attending: Cardiology | Admitting: Cardiology

## 2011-01-17 ENCOUNTER — Encounter: Payer: Self-pay | Admitting: Pulmonary Disease

## 2011-01-17 ENCOUNTER — Ambulatory Visit (INDEPENDENT_AMBULATORY_CARE_PROVIDER_SITE_OTHER): Payer: Managed Care, Other (non HMO) | Admitting: Pulmonary Disease

## 2011-01-17 ENCOUNTER — Encounter (HOSPITAL_COMMUNITY)
Admission: RE | Admit: 2011-01-17 | Discharge: 2011-01-17 | Disposition: A | Discharge status: Home / Self Care | Payer: Managed Care, Other (non HMO) | Source: Ambulatory Visit | Attending: Cardiology | Admitting: Cardiology

## 2011-01-17 ENCOUNTER — Other Ambulatory Visit (INDEPENDENT_AMBULATORY_CARE_PROVIDER_SITE_OTHER): Payer: Managed Care, Other (non HMO) | Admitting: *Deleted

## 2011-01-17 VITALS — BP 132/82 | HR 51 | Temp 98.7°F | Ht 68.0 in | Wt 244.2 lb

## 2011-01-17 DIAGNOSIS — I2510 Atherosclerotic heart disease of native coronary artery without angina pectoris: Secondary | ICD-10-CM

## 2011-01-17 DIAGNOSIS — N289 Disorder of kidney and ureter, unspecified: Secondary | ICD-10-CM

## 2011-01-17 DIAGNOSIS — G4733 Obstructive sleep apnea (adult) (pediatric): Secondary | ICD-10-CM

## 2011-01-17 DIAGNOSIS — E782 Mixed hyperlipidemia: Secondary | ICD-10-CM

## 2011-01-17 DIAGNOSIS — I498 Other specified cardiac arrhythmias: Secondary | ICD-10-CM

## 2011-01-17 DIAGNOSIS — I10 Essential (primary) hypertension: Secondary | ICD-10-CM

## 2011-01-17 DIAGNOSIS — R001 Bradycardia, unspecified: Secondary | ICD-10-CM

## 2011-01-17 LAB — HEPATIC FUNCTION PANEL
ALT: 27 U/L (ref 0–53)
Albumin: 3.5 g/dL (ref 3.5–5.2)
Alkaline Phosphatase: 80 U/L (ref 39–117)
Bilirubin, Direct: 0.1 mg/dL (ref 0.0–0.3)

## 2011-01-17 LAB — LIPID PANEL
Cholesterol: 123 mg/dL (ref 0–200)
LDL Cholesterol: 63 mg/dL (ref 0–99)
Total CHOL/HDL Ratio: 3

## 2011-01-17 NOTE — Patient Instructions (Signed)
Will start on cpap.  Please call if having tolerance issues. Work on weight loss followup with me in 5 weeks.  

## 2011-01-17 NOTE — Assessment & Plan Note (Signed)
The patient has severe obstructive sleep apnea by his home sleep test, with definite symptoms which are impacting his quality of life.  Given the severity of his sleep apnea, I would recommend treatment with CPAP while he is working aggressively on weight loss. He is agreeable to this approach. I will set the patient up on cpap at a moderate pressure level to allow for desensitization, and will troubleshoot the device over the next 4-6weeks if needed.  The pt is to call me if having issues with tolerance.  Will then optimize the pressure once patient is able to wear cpap on a consistent basis.

## 2011-01-17 NOTE — Progress Notes (Signed)
  Subjective:    Patient ID: Daniel Solomon, male    DOB: 05-12-1960, 50 y.o.   MRN: [id number]  HPI The patient comes in today for followup after his recent home sleep testing.  He was found to have severe obstructive sleep apnea, with an AHI of 45 events per hour and O2 desaturation below 80%.  I have reviewed the study with him in detail, and answered all of his questions.   Review of Systems  Constitutional: Negative for fever and unexpected weight change.  HENT: Negative for ear pain, nosebleeds, congestion, sore throat, rhinorrhea, sneezing, trouble swallowing, dental problem, postnasal drip and sinus pressure.   Eyes: Negative for redness and itching.  Respiratory: Negative for cough, chest tightness, shortness of breath and wheezing.   Cardiovascular: Negative for palpitations and leg swelling.  Gastrointestinal: Negative for nausea and vomiting.  Genitourinary: Negative for dysuria.  Musculoskeletal: Negative for joint swelling.  Skin: Negative for rash.  Neurological: Negative for headaches.  Hematological: Bruises/bleeds easily.  Psychiatric/Behavioral: Negative for dysphoric mood. The patient is not nervous/anxious.        Objective:   Physical Exam Overweight male in no acute distress Nose without purulence or discharge noted Lower extremities without edema, no cyanosis Alert and oriented, moves all 4 extremities.        Assessment & Plan:

## 2011-01-19 ENCOUNTER — Encounter (HOSPITAL_COMMUNITY)
Admission: RE | Admit: 2011-01-19 | Discharge: 2011-01-19 | Disposition: A | Discharge status: Home / Self Care | Payer: Managed Care, Other (non HMO) | Source: Ambulatory Visit | Attending: Cardiology | Admitting: Cardiology

## 2011-01-19 ENCOUNTER — Encounter: Payer: Self-pay | Admitting: *Deleted

## 2011-01-24 ENCOUNTER — Encounter (HOSPITAL_COMMUNITY)
Admission: RE | Admit: 2011-01-24 | Discharge: 2011-01-24 | Disposition: A | Discharge status: Home / Self Care | Payer: Managed Care, Other (non HMO) | Source: Ambulatory Visit | Attending: Cardiology | Admitting: Cardiology

## 2011-01-26 ENCOUNTER — Encounter (HOSPITAL_COMMUNITY)
Admission: RE | Admit: 2011-01-26 | Discharge: 2011-01-26 | Disposition: A | Discharge status: Home / Self Care | Payer: Managed Care, Other (non HMO) | Source: Ambulatory Visit | Attending: Cardiology | Admitting: Cardiology

## 2011-01-28 ENCOUNTER — Encounter (HOSPITAL_COMMUNITY)
Admission: RE | Admit: 2011-01-28 | Discharge: 2011-01-28 | Disposition: A | Discharge status: Home / Self Care | Payer: Managed Care, Other (non HMO) | Source: Ambulatory Visit | Attending: Cardiology | Admitting: Cardiology

## 2011-01-31 ENCOUNTER — Encounter (HOSPITAL_COMMUNITY)
Admission: RE | Admit: 2011-01-31 | Discharge: 2011-01-31 | Disposition: A | Discharge status: Home / Self Care | Payer: Managed Care, Other (non HMO) | Source: Ambulatory Visit | Attending: Cardiology | Admitting: Cardiology

## 2011-01-31 DIAGNOSIS — I2510 Atherosclerotic heart disease of native coronary artery without angina pectoris: Secondary | ICD-10-CM | POA: Insufficient documentation

## 2011-01-31 DIAGNOSIS — M109 Gout, unspecified: Secondary | ICD-10-CM | POA: Insufficient documentation

## 2011-01-31 DIAGNOSIS — H409 Unspecified glaucoma: Secondary | ICD-10-CM | POA: Insufficient documentation

## 2011-01-31 DIAGNOSIS — I10 Essential (primary) hypertension: Secondary | ICD-10-CM | POA: Insufficient documentation

## 2011-01-31 DIAGNOSIS — G4733 Obstructive sleep apnea (adult) (pediatric): Secondary | ICD-10-CM | POA: Insufficient documentation

## 2011-01-31 DIAGNOSIS — Z9861 Coronary angioplasty status: Secondary | ICD-10-CM | POA: Insufficient documentation

## 2011-01-31 DIAGNOSIS — E7800 Pure hypercholesterolemia, unspecified: Secondary | ICD-10-CM | POA: Insufficient documentation

## 2011-01-31 DIAGNOSIS — Z5189 Encounter for other specified aftercare: Secondary | ICD-10-CM | POA: Insufficient documentation

## 2011-02-02 ENCOUNTER — Encounter (HOSPITAL_COMMUNITY)
Admission: RE | Admit: 2011-02-02 | Discharge: 2011-02-02 | Disposition: A | Discharge status: Home / Self Care | Payer: Managed Care, Other (non HMO) | Source: Ambulatory Visit | Attending: Cardiology | Admitting: Cardiology

## 2011-02-04 ENCOUNTER — Encounter (HOSPITAL_COMMUNITY)
Admission: RE | Admit: 2011-02-04 | Discharge: 2011-02-04 | Disposition: A | Discharge status: Home / Self Care | Payer: Managed Care, Other (non HMO) | Source: Ambulatory Visit | Attending: Cardiology | Admitting: Cardiology

## 2011-02-07 ENCOUNTER — Encounter (HOSPITAL_COMMUNITY)
Admission: RE | Admit: 2011-02-07 | Discharge: 2011-02-07 | Disposition: A | Discharge status: Home / Self Care | Payer: Managed Care, Other (non HMO) | Source: Ambulatory Visit | Attending: Cardiology | Admitting: Cardiology

## 2011-02-09 ENCOUNTER — Encounter (HOSPITAL_COMMUNITY)
Admission: RE | Admit: 2011-02-09 | Discharge: 2011-02-09 | Disposition: A | Discharge status: Home / Self Care | Payer: Managed Care, Other (non HMO) | Source: Ambulatory Visit | Attending: Cardiology | Admitting: Cardiology

## 2011-02-11 ENCOUNTER — Encounter (HOSPITAL_COMMUNITY)
Admission: RE | Admit: 2011-02-11 | Discharge: 2011-02-11 | Disposition: A | Discharge status: Home / Self Care | Payer: Managed Care, Other (non HMO) | Source: Ambulatory Visit | Attending: Cardiology | Admitting: Cardiology

## 2011-02-14 ENCOUNTER — Encounter (HOSPITAL_COMMUNITY)
Admission: RE | Admit: 2011-02-14 | Discharge: 2011-02-14 | Disposition: A | Discharge status: Home / Self Care | Payer: Managed Care, Other (non HMO) | Source: Ambulatory Visit | Attending: Cardiology | Admitting: Cardiology

## 2011-02-16 ENCOUNTER — Encounter (HOSPITAL_COMMUNITY)
Admission: RE | Admit: 2011-02-16 | Discharge: 2011-02-16 | Disposition: A | Discharge status: Home / Self Care | Payer: Managed Care, Other (non HMO) | Source: Ambulatory Visit | Attending: Cardiology | Admitting: Cardiology

## 2011-02-18 ENCOUNTER — Encounter (HOSPITAL_COMMUNITY)
Admission: RE | Admit: 2011-02-18 | Discharge: 2011-02-18 | Disposition: A | Discharge status: Home / Self Care | Payer: Managed Care, Other (non HMO) | Source: Ambulatory Visit | Attending: Cardiology | Admitting: Cardiology

## 2011-02-21 ENCOUNTER — Encounter: Payer: Self-pay | Admitting: Pulmonary Disease

## 2011-02-21 ENCOUNTER — Ambulatory Visit (INDEPENDENT_AMBULATORY_CARE_PROVIDER_SITE_OTHER): Payer: Managed Care, Other (non HMO) | Admitting: Pulmonary Disease

## 2011-02-21 VITALS — BP 120/84 | HR 49 | Temp 98.1°F | Ht 68.0 in | Wt 246.4 lb

## 2011-02-21 DIAGNOSIS — G4733 Obstructive sleep apnea (adult) (pediatric): Secondary | ICD-10-CM

## 2011-02-21 NOTE — Progress Notes (Signed)
  Subjective:    Patient ID: Daniel Solomon, male    DOB: January 03, 1961, 50 y.o.   MRN: [id number]  HPI The patient comes in today for followup after starting on CPAP for his obstructive sleep apnea.  He's been wearing the device compliantly, and reports no significant issues with mask fit or pressure.  He is using nasal pillows, but denies any issues with mouth opening.  He has seen some improvement in his sleep, but has not seen any difference in how he feels next day or daytime alertness.  I have reminded him that we have yet to optimize his pressure.   Review of Systems  Constitutional: Negative for fever and unexpected weight change.  HENT: Negative for ear pain, nosebleeds, congestion, sore throat, rhinorrhea, sneezing, trouble swallowing, dental problem, postnasal drip and sinus pressure.   Eyes: Negative for redness and itching.  Respiratory: Negative for cough, chest tightness, shortness of breath and wheezing.   Cardiovascular: Negative for palpitations and leg swelling.  Gastrointestinal: Negative for nausea and vomiting.  Genitourinary: Negative for dysuria.  Musculoskeletal: Negative for joint swelling.  Skin: Negative for rash.  Neurological: Negative for headaches.  Hematological: Does not bruise/bleed easily.  Psychiatric/Behavioral: Negative for dysphoric mood. The patient is not nervous/anxious.        Objective:   Physical Exam Overweight male in no acute distress No skin breakdown or pressure necrosis from the CPAP mask Lower extremities without edema, no cyanosis noted Alert and oriented, moves all 4 extremities.       Assessment & Plan:

## 2011-02-21 NOTE — Assessment & Plan Note (Signed)
The patient has been wearing CPAP compliantly, and denies any significant issues with the mask or machine.  Although he feels that he is sleeping little better, he has not seen a big difference yet in his daytime alertness.  I have reminded him that we have to optimize his pressure.  I have also encouraged him to work aggressively on weight loss while wearing the CPAP device. Care Plan:  At this point, will arrange for the patient's machine to be changed over to auto mode for 2 weeks to optimize their pressure.  I will review the downloaded data once sent by dme, and also evaluate for compliance, leaks, and residual osa.  I will call the patient and dme to discuss the results, and have the patient's machine set appropriately.  This will serve as the pt's cpap pressure titration.

## 2011-02-21 NOTE — Patient Instructions (Signed)
Will optimize your cpap for you over the next 2-3 weeks.  Will call with optimal pressure level Work on weight loss Have wife keep an eye out for possible mouth opening. followup with me in 6 mos if doing well, but call if you are not seeing improvement in your symptoms.

## 2011-02-23 ENCOUNTER — Encounter (HOSPITAL_COMMUNITY): Payer: Managed Care, Other (non HMO)

## 2011-02-25 ENCOUNTER — Encounter (HOSPITAL_COMMUNITY): Payer: Managed Care, Other (non HMO)

## 2011-02-28 ENCOUNTER — Encounter (HOSPITAL_COMMUNITY): Payer: Managed Care, Other (non HMO)

## 2011-03-02 ENCOUNTER — Encounter (HOSPITAL_COMMUNITY): Payer: 59

## 2011-03-02 DIAGNOSIS — Z9861 Coronary angioplasty status: Secondary | ICD-10-CM | POA: Insufficient documentation

## 2011-03-02 DIAGNOSIS — M109 Gout, unspecified: Secondary | ICD-10-CM | POA: Insufficient documentation

## 2011-03-02 DIAGNOSIS — E7800 Pure hypercholesterolemia, unspecified: Secondary | ICD-10-CM | POA: Insufficient documentation

## 2011-03-02 DIAGNOSIS — Z5189 Encounter for other specified aftercare: Secondary | ICD-10-CM | POA: Insufficient documentation

## 2011-03-02 DIAGNOSIS — I2510 Atherosclerotic heart disease of native coronary artery without angina pectoris: Secondary | ICD-10-CM | POA: Insufficient documentation

## 2011-03-02 DIAGNOSIS — H409 Unspecified glaucoma: Secondary | ICD-10-CM | POA: Insufficient documentation

## 2011-03-02 DIAGNOSIS — G4733 Obstructive sleep apnea (adult) (pediatric): Secondary | ICD-10-CM | POA: Insufficient documentation

## 2011-03-02 DIAGNOSIS — I10 Essential (primary) hypertension: Secondary | ICD-10-CM | POA: Insufficient documentation

## 2011-03-04 ENCOUNTER — Encounter (HOSPITAL_COMMUNITY)
Admission: RE | Admit: 2011-03-04 | Discharge: 2011-03-04 | Disposition: A | Discharge status: Home / Self Care | Payer: 59 | Source: Ambulatory Visit | Attending: Cardiology | Admitting: Cardiology

## 2011-03-07 ENCOUNTER — Encounter (HOSPITAL_COMMUNITY)
Admission: RE | Admit: 2011-03-07 | Discharge: 2011-03-07 | Disposition: A | Discharge status: Home / Self Care | Payer: 59 | Source: Ambulatory Visit | Attending: Cardiology | Admitting: Cardiology

## 2011-03-09 ENCOUNTER — Encounter (HOSPITAL_COMMUNITY)
Admission: RE | Admit: 2011-03-09 | Discharge: 2011-03-09 | Disposition: A | Discharge status: Home / Self Care | Payer: 59 | Source: Ambulatory Visit | Attending: Cardiology | Admitting: Cardiology

## 2011-03-11 ENCOUNTER — Encounter (HOSPITAL_COMMUNITY)
Admission: RE | Admit: 2011-03-11 | Discharge: 2011-03-11 | Disposition: A | Discharge status: Home / Self Care | Payer: 59 | Source: Ambulatory Visit | Attending: Cardiology | Admitting: Cardiology

## 2011-03-14 ENCOUNTER — Encounter (HOSPITAL_COMMUNITY)
Admission: RE | Admit: 2011-03-14 | Discharge: 2011-03-14 | Disposition: A | Discharge status: Home / Self Care | Payer: 59 | Source: Ambulatory Visit | Attending: Cardiology | Admitting: Cardiology

## 2011-03-16 ENCOUNTER — Encounter (HOSPITAL_COMMUNITY)
Admission: RE | Admit: 2011-03-16 | Discharge: 2011-03-16 | Disposition: A | Discharge status: Home / Self Care | Payer: 59 | Source: Ambulatory Visit | Attending: Cardiology | Admitting: Cardiology

## 2011-03-18 ENCOUNTER — Encounter (HOSPITAL_COMMUNITY): Payer: 59

## 2011-03-21 ENCOUNTER — Encounter: Payer: Self-pay | Admitting: Cardiology

## 2011-03-23 ENCOUNTER — Encounter (HOSPITAL_COMMUNITY)
Admission: RE | Admit: 2011-03-23 | Discharge: 2011-03-23 | Disposition: A | Discharge status: Home / Self Care | Payer: 59 | Source: Ambulatory Visit | Attending: Cardiology | Admitting: Cardiology

## 2011-03-24 NOTE — Progress Notes (Addendum)
Cardiac Rehabilitation Program Progress Report   Orientation:  12/09/2010 Graduate Date:  03/23/2011 Discharge Date:   # of sessions completed: 36  Cardiologist: Verl Blalock Family MD:  Domenica Fail Time:  0645  A.  Exercise Program:  Tolerates exercise @ 4.6 METS for 39 minutes, Improved functional capacity  34.69 %, Improved  muscular strength  25.76 %, No Change  flexibility 0 % and Discharged to home exercise program.  Anticipated compliance:  fair Error: Tolerates exercise at 4.6 METS for 30 minutes, JC 1/23  B.  Mental Health:    C.  Education/Instruction/Skills  Accurately checks own pulse, Knows THR for exercise, Uses Perceived Exertion Scale and Attended 10 education classes   D.  Nutrition/Weight Control/Body Composition:  Pt following a step 2 TLC diet, % Body Fat 38.7%, Pt has gained 1.3 kg, BMI 37.0  *This section completed by Derek Mound, Reed Pandy, RD, LDN, CDE  E.  Blood Lipids Lab Results  Component Value Date   CHOL 123 01/17/2011   HDL 35.40* 01/17/2011   LDLCALC 63 01/17/2011   TRIG 124.0 01/17/2011   CHOLHDL 3 01/17/2011  *This section completed by Derek Mound, Reed Pandy, RD, LDN, CDE  F.  Lifestyle Changes:    G.  Symptoms noted with exercise:  Resting and Exertional Hypertension  Report Completed By:  Dorann Ou   Comments:  Signed by Thea Silversmith MS on Thursday January 24th 2013 at 0902

## 2011-03-27 ENCOUNTER — Other Ambulatory Visit: Payer: Self-pay | Admitting: Pulmonary Disease

## 2011-03-27 DIAGNOSIS — G4733 Obstructive sleep apnea (adult) (pediatric): Secondary | ICD-10-CM

## 2011-04-14 ENCOUNTER — Encounter: Payer: Self-pay | Admitting: Cardiology

## 2011-04-14 ENCOUNTER — Ambulatory Visit (INDEPENDENT_AMBULATORY_CARE_PROVIDER_SITE_OTHER): Payer: 59 | Admitting: Cardiology

## 2011-04-14 VITALS — BP 148/90 | HR 52 | Ht 68.0 in | Wt 247.0 lb

## 2011-04-14 DIAGNOSIS — I498 Other specified cardiac arrhythmias: Secondary | ICD-10-CM

## 2011-04-14 DIAGNOSIS — I2510 Atherosclerotic heart disease of native coronary artery without angina pectoris: Secondary | ICD-10-CM

## 2011-04-14 DIAGNOSIS — N289 Disorder of kidney and ureter, unspecified: Secondary | ICD-10-CM

## 2011-04-14 DIAGNOSIS — R001 Bradycardia, unspecified: Secondary | ICD-10-CM

## 2011-04-14 DIAGNOSIS — E782 Mixed hyperlipidemia: Secondary | ICD-10-CM

## 2011-04-14 DIAGNOSIS — G4733 Obstructive sleep apnea (adult) (pediatric): Secondary | ICD-10-CM

## 2011-04-14 DIAGNOSIS — I10 Essential (primary) hypertension: Secondary | ICD-10-CM

## 2011-04-14 MED ORDER — ATORVASTATIN CALCIUM 40 MG PO TABS: 40.0000 mg | ORAL_TABLET | Freq: Every day | ORAL | Status: DC

## 2011-04-14 MED ORDER — PRASUGREL HCL 10 MG PO TABS: 10.0000 mg | ORAL_TABLET | Freq: Every day | ORAL | Status: DC

## 2011-04-14 MED ORDER — GEMFIBROZIL 600 MG PO TABS: 600.0000 mg | ORAL_TABLET | Freq: Two times a day (BID) | ORAL | Status: DC

## 2011-04-14 MED ORDER — LISINOPRIL 10 MG PO TABS: 10.0000 mg | ORAL_TABLET | Freq: Every day | ORAL | Status: DC

## 2011-04-14 NOTE — Assessment & Plan Note (Signed)
Stable without symptoms of angina or ischemia. He is still at significant risk of having another acute coronary syndrome. I've increased as a separate old for better blood pressure control to 10 mg per day, encouraged 10 pounds or more weight loss, signed a form so he can exercise at work at least 3 hours a week, and encouraged dietary compliance particular salt and fat and carbohydrates. I will

## 2011-04-14 NOTE — Progress Notes (Signed)
HPI [name] comes in today for a six-month followup of having a drug-eluting stent to his LAD. His has diffuse coronary disease otherwise.  He lost 10 pounds in cardiac rehabilitation. He has gained it back since rehabilitation. He spends a lot of time in the car driving to and from work daily. He said is sedentary. He brings papers fot me to sign so he can work out at the fitness center there at work.  Denies any angina or ischemic symptoms. He is compliant with his medications. His lipids were goal except for a low HDL. His blood pressure is borderline today.  Past Medical History  Diagnosis Date  . Hypertension   . Hypercholesterolemia     With hypertriglyceridemia  . Glaucoma   . Kidney stones   . Gout   . CAD (coronary artery disease)     proximal LAD 50%, mid LAD 50-60%, mid to distal LAD 99%; mid circumflex 20%; proximal RCA 20%, distal RCA 50%; PDA 60-70%, proximal posterior AV groove 50%, proximal posterior lateral 50%.  There was a question of possible LVOT gradient;  s/p Resolute DES to mid-distal LAD 10/2010;  echocardiogram 11/17/10: EF 55-60%, mild LVH, grade 1 diastolic dysfunction, normal aortic valve, mild MR, PASP 32   . Renal insufficiency     Current Outpatient Prescriptions  Medication Sig Dispense Refill  . allopurinol (ZYLOPRIM) 300 MG tablet Take 300 mg by mouth daily.        [name] aspirin 81 MG tablet Take 81 mg by mouth daily.        [name] atorvastatin (LIPITOR) 40 MG tablet Take 40 mg by mouth daily.        . bimatoprost (LUMIGAN) 0.03 % ophthalmic solution Place 1 drop into both eyes at bedtime.        . bisoprolol (ZEBETA) 5 MG tablet Take 1/2 tablet daily      . gemfibrozil (LOPID) 600 MG tablet Take 600 mg by mouth 2 (two) times daily before a meal.        . lisinopril (PRINIVIL,ZESTRIL) 10 MG tablet Take 1/2 tablet by mouth once daily.      . prasugrel (EFFIENT) 10 MG TABS Take 10 mg by mouth daily.        . timolol (TIMOPTIC-XR) 0.25 % ophthalmic gel-forming  Place 1 drop into both eyes daily.         No Known Allergies  Family History  Problem Relation Age of Onset  . Hypertension Father   . Heart disease Father   . Valvular heart disease Mother     History   Social History  . Marital Status: Married    Spouse Name: N/A    Number of Children: 4  . Years of Education: N/A   Occupational History  . Financial planner    Social History Main Topics  . Smoking status: Never Smoker   . Smokeless tobacco: Never Used  . Alcohol Use: No  . Drug Use: No  . Sexually Active: Not on file   Other Topics Concern  . Not on file   Social History Narrative  . No narrative on file    ROS ALL NEGATIVE EXCEPT THOSE NOTED IN HPI  PE  General Appearance: well developed, well nourished in no acute distressObese HEENT: symmetrical face, PERRLA, good dentition  Neck: no JVD, thyromegaly, or adenopathy, trachea midline Chest: symmetric without deformity Cardiac: PMI non-displaced, RRR, normal S1, S2, no gallop or murmur Lung: clear to ausculation and percussion Vascular: all  pulses full without bruits  Abdominal: nondistended, nontender, good bowel sounds, no HSM, no bruits Extremities: no cyanosis, clubbing or edema, no sign of DVT, no varicosities  Skin: normal color, no rashes Neuro: alert and oriented x 3, non-focal Pysch: normal affect  EKG About 80 today. BMET    Component Value Date/Time   NA 143 12/06/2010 1618   K 4.3 12/06/2010 1618   CL 110 12/06/2010 1618   CO2 26 12/06/2010 1618   GLUCOSE 84 12/06/2010 1618   BUN 24* 12/06/2010 1618   CREATININE 1.5 12/06/2010 1618   CALCIUM 8.9 12/06/2010 1618   GFRNONAA >60 11/18/2010 0505   GFRAA >60 11/18/2010 0505    Lipid Panel     Component Value Date/Time   CHOL 123 01/17/2011 0852   TRIG 124.0 01/17/2011 0852   HDL 35.40* 01/17/2011 0852   CHOLHDL 3 01/17/2011 0852   VLDL 24.8 01/17/2011 0852   LDLCALC 63 01/17/2011 0852    CBC    Component Value Date/Time   WBC 8.3  11/18/2010 0505   RBC 5.08 11/18/2010 0505   HGB 16.2 11/18/2010 0505   HCT 46.3 11/18/2010 0505   PLT 158 11/18/2010 0505   MCV 91.1 11/18/2010 0505   MCH 31.9 11/18/2010 0505   MCHC 35.0 11/18/2010 0505   RDW 12.8 11/18/2010 0505

## 2011-04-14 NOTE — Patient Instructions (Signed)
Increase Lisinopril to 10mg  daily.  Cut out/down carbohydrate intake.  Walk 3 hours per week.  Lose 10 pounds.  Enter fitness program at work.  Your physician wants you to follow-up in: 6 months.  You will receive a reminder letter in the mail two months in advance. If you don't receive a letter, please call our office to schedule the follow-up appointment.

## 2011-04-14 NOTE — Progress Notes (Signed)
Cardiac Rehab program progress Report continued.  Pt completed 36 exercise session from 12/13/10 to 03/23/11.  Pt with excellent attendance with limited absences except for department closure due to renovation and observed holidays.  Pt  made excellent progress with his short and long term goals met.  Pt had zero hospitalizations.  I anticipate he will continue to make heart healthy lifestyle changes and will adhere to his home exercise program.

## 2011-04-14 NOTE — Assessment & Plan Note (Signed)
Not well controlled. Increase lisinopril, weight loss, exercise, dietary restriction in size.

## 2011-08-22 ENCOUNTER — Encounter: Payer: Self-pay | Admitting: Pulmonary Disease

## 2011-08-22 ENCOUNTER — Ambulatory Visit (INDEPENDENT_AMBULATORY_CARE_PROVIDER_SITE_OTHER): Payer: 59 | Admitting: Pulmonary Disease

## 2011-08-22 VITALS — BP 122/78 | HR 52 | Temp 97.9°F | Ht 68.0 in | Wt 245.2 lb

## 2011-08-22 DIAGNOSIS — G4733 Obstructive sleep apnea (adult) (pediatric): Secondary | ICD-10-CM

## 2011-08-22 NOTE — Patient Instructions (Addendum)
Continue with cpap, and keep up with supplies and mask changes. Work on weight loss followup with me in one year if doing well.

## 2011-08-22 NOTE — Assessment & Plan Note (Signed)
The pt reports that he is wearing cpap compliantly, and feels it is helping his sleep and daytime alertness.  I have asked him to keep up with the mask changes and supplies, and to work aggressively on weight loss.  If he is doing well, I will see him back in one year.

## 2011-08-22 NOTE — Progress Notes (Signed)
  Subjective:    Patient ID: Daniel Solomon, male    DOB: 1961/01/26, 51 y.o.   MRN: [id number]  HPI Patient comes in today for followup of his known severe struck of sleep apnea.  He is wearing CPAP compliantly, and denies any issues with his mask fit.  He feels that he is sleeping better, with improved daytime alertness.  He is satisfied with his response to CPAP.   Review of Systems  Constitutional: Negative for fever and unexpected weight change.  HENT: Negative for ear pain, nosebleeds, congestion, sore throat, rhinorrhea, sneezing, trouble swallowing, dental problem, postnasal drip and sinus pressure.   Eyes: Negative for redness and itching.  Respiratory: Negative for cough, chest tightness, shortness of breath and wheezing.   Cardiovascular: Negative for palpitations and leg swelling.  Gastrointestinal: Negative for nausea and vomiting.  Genitourinary: Negative for dysuria.  Musculoskeletal: Negative for joint swelling.  Skin: Negative for rash.  Neurological: Negative for headaches.  Hematological: Does not bruise/bleed easily.  Psychiatric/Behavioral: Negative for dysphoric mood. The patient is not nervous/anxious.   All other systems reviewed and are negative.       Objective:   Physical Exam Overweight male in no acute distress Nose without purulence or discharge noted No skin breakdown or pressure necrosis from the CPAP mask Lower extremities without edema, no cyanosis Alert and oriented, does not appear to be sleepy, moves all 4 extremities.       Assessment & Plan:

## 2011-12-01 ENCOUNTER — Other Ambulatory Visit: Payer: Self-pay | Admitting: Cardiology

## 2011-12-01 MED ORDER — BISOPROLOL FUMARATE 5 MG PO TABS: ORAL_TABLET | ORAL | Status: DC

## 2011-12-01 NOTE — Telephone Encounter (Signed)
Pt needs refill I have confirmed Pharm

## 2012-02-09 ENCOUNTER — Encounter: Payer: Self-pay | Admitting: Physician Assistant

## 2012-02-09 ENCOUNTER — Ambulatory Visit: Payer: 59 | Admitting: Cardiology

## 2012-02-09 ENCOUNTER — Ambulatory Visit (INDEPENDENT_AMBULATORY_CARE_PROVIDER_SITE_OTHER): Payer: 59 | Admitting: Physician Assistant

## 2012-02-09 VITALS — BP 152/82 | HR 47 | Ht 68.0 in | Wt 248.6 lb

## 2012-02-09 DIAGNOSIS — I2510 Atherosclerotic heart disease of native coronary artery without angina pectoris: Secondary | ICD-10-CM

## 2012-02-09 DIAGNOSIS — I25810 Atherosclerosis of coronary artery bypass graft(s) without angina pectoris: Secondary | ICD-10-CM

## 2012-02-09 DIAGNOSIS — R0989 Other specified symptoms and signs involving the circulatory and respiratory systems: Secondary | ICD-10-CM | POA: Insufficient documentation

## 2012-02-09 DIAGNOSIS — E785 Hyperlipidemia, unspecified: Secondary | ICD-10-CM

## 2012-02-09 DIAGNOSIS — E782 Mixed hyperlipidemia: Secondary | ICD-10-CM

## 2012-02-09 DIAGNOSIS — I10 Essential (primary) hypertension: Secondary | ICD-10-CM

## 2012-02-09 MED ORDER — CLOPIDOGREL BISULFATE 75 MG PO TABS: 75.0000 mg | ORAL_TABLET | Freq: Every day | ORAL | Status: AC

## 2012-02-09 MED ORDER — BISOPROLOL FUMARATE 5 MG PO TABS: ORAL_TABLET | ORAL | Status: DC

## 2012-02-09 MED ORDER — GEMFIBROZIL 600 MG PO TABS: 600.0000 mg | ORAL_TABLET | Freq: Two times a day (BID) | ORAL | Status: DC

## 2012-02-09 MED ORDER — ATORVASTATIN CALCIUM 40 MG PO TABS: 40.0000 mg | ORAL_TABLET | Freq: Every day | ORAL | Status: DC

## 2012-02-09 MED ORDER — LISINOPRIL 10 MG PO TABS: 10.0000 mg | ORAL_TABLET | Freq: Every day | ORAL | Status: DC

## 2012-02-09 NOTE — Assessment & Plan Note (Signed)
Check fasting lipid panel and LFTs 

## 2012-02-09 NOTE — Patient Instructions (Addendum)
Your physician wants you to follow-up in: 6 months with Dr Verl Blalock. You will receive a reminder letter in the mail two months in advance. If you don't receive a letter, please call our office to schedule the follow-up appointment.  Your physician recommends that you return for lab work drawn tomorrow (lipid and liver profile)  Your physician has requested that you have a carotid duplex. This test is an ultrasound of the carotid arteries in your neck. It looks at blood flow through these arteries that supply the brain with blood. Allow one hour for this exam. There are no restrictions or special instructions.  Your physician has recommended you make the following change in your medication: STOP Effient when you finish your supply and then START Plavix 75 mg daily

## 2012-02-09 NOTE — Progress Notes (Signed)
HPI:   This is a 51 year old white male patient of Dr. Gershon Mussel wall who has a history of a drug-eluting stent to his LAD in September 2012.He has residual 50% proximal and mid LAD, 20% circumflex, 20% RCA, 50% distal RCA, 60-70% PDA, 50% proximal posterior AV groove, and 50% proximal posterolateral. 2-D echo at that time ejection fraction was 55-60% with mild LVH and grade 1 diastolic dysfunction with normal aortic valve, mild MR.  The patient comes in today for a six-month followup. He denies any chest pain, palpitations, dyspnea, dyspnea exertion, dizziness, or presyncope. He is hoping to come off the Effient. He walks 30 minutes a day on the treadmill at work and goes about 1-1/2 miles.  No Known Allergies  Current Outpatient Prescriptions on File Prior to Visit: allopurinol (ZYLOPRIM) 300 MG tablet, Take 300 mg by mouth daily.  , Disp: , Rfl:  aspirin 81 MG tablet, Take 81 mg by mouth daily.  , Disp: , Rfl:  atorvastatin (LIPITOR) 40 MG tablet, Take 1 tablet (40 mg total) by mouth daily., Disp: 90 tablet, Rfl: 3 bisoprolol (ZEBETA) 5 MG tablet, Take 1/2 tablet daily, Disp: 45 tablet, Rfl: 3 gemfibrozil (LOPID) 600 MG tablet, Take 1 tablet (600 mg total) by mouth 2 (two) times daily before a meal., Disp: 120 tablet, Rfl: 3 lisinopril (PRINIVIL,ZESTRIL) 10 MG tablet, Take 1 tablet (10 mg total) by mouth daily., Disp: 90 tablet, Rfl: 3 prasugrel (EFFIENT) 10 MG TABS, Take 1 tablet (10 mg total) by mouth daily., Disp: 90 tablet, Rfl: 3 timolol (TIMOPTIC-XR) 0.25 % ophthalmic gel-forming, Place 1 drop into both eyes daily. , Disp: , Rfl:     Past Medical History:   Hypertension                                                 Hypercholesterolemia                                           Comment:With hypertriglyceridemia   Glaucoma(365)                                                Kidney stones                                                Gout                                                          CAD (coronary artery disease)                                  Comment:proximal LAD 50%, mid LAD 50-60%, mid to distal  LAD 99%; mid circumflex 20%; proximal RCA 20%,               distal RCA 50%; PDA 60-70%, proximal posterior               AV groove 50%, proximal posterior lateral 50%.               There was a question of possible LVOT gradient;              s/p Resolute DES to mid-distal LAD 10/2010;                echocardiogram 11/17/10: EF 55-60%, mild LVH,               grade 1 diastolic dysfunction, normal aortic               valve, mild MR, PASP 32    Renal insufficiency                                         Past Surgical History:   CORONARY STENT PLACEMENT                        2012        Review of patient's family history indicates:   Hypertension                   Father                   Heart disease                  Father                   Valvular heart disease         Mother                   Social History   Marital Status: Married             Spouse Name:                      Years of Education:                 Number of children: 4           Occupational History Occupation          Education officer, community                         Social History Main Topics   Smoking Status: Never Smoker                     Smokeless Status: Never Used                       Alcohol Use: No             Drug Use: No             Sexual Activity: Not on file        Other Topics  Concern   None on file  Social History Narrative   None on file    ROS:see history of present illness otherwise negative   PHYSICAL EXAM: Well-nournished, in no acute distress. Neck:bilateral carotid bruits, No JVD, HJR,  or thyroid enlargement  Lungs: No tachypnea, clear without wheezing, rales, or rhonchi  Cardiovascular: RRR, PMI not displaced, positive S4 and 2/6 systolic murmur at the left sternal border, no bruit,  thrill, or heave.  Abdomen: BS normal. Soft without organomegaly, masses, lesions or tenderness.  Extremities: without cyanosis, clubbing or edema. Good distal pulses bilateral  SKin: Warm, no lesions or rashes   Musculoskeletal: No deformities  Neuro: no focal signs  BP 152/82  Pulse 47  Ht 5\' 8"  (1.727 m)  Wt 248 lb 9.6 oz (112.764 kg)  BMI 37.80 kg/m2   [id number] bradycardia at 46 beats per minute no acute change.

## 2012-02-09 NOTE — Assessment & Plan Note (Signed)
Stable without cardiac complaints. Dr. Verl Blalock who agrees that the patient can stop the Effient  after his prescription runs out in 3 months and then will start Plavix 75mg  daily along with Aspirin.

## 2012-02-10 ENCOUNTER — Other Ambulatory Visit (INDEPENDENT_AMBULATORY_CARE_PROVIDER_SITE_OTHER): Payer: 59

## 2012-02-10 ENCOUNTER — Encounter (INDEPENDENT_AMBULATORY_CARE_PROVIDER_SITE_OTHER): Payer: 59

## 2012-02-10 ENCOUNTER — Other Ambulatory Visit: Payer: 59

## 2012-02-10 DIAGNOSIS — E785 Hyperlipidemia, unspecified: Secondary | ICD-10-CM

## 2012-02-10 DIAGNOSIS — I6529 Occlusion and stenosis of unspecified carotid artery: Secondary | ICD-10-CM

## 2012-02-10 DIAGNOSIS — R0989 Other specified symptoms and signs involving the circulatory and respiratory systems: Secondary | ICD-10-CM

## 2012-02-10 LAB — HEPATIC FUNCTION PANEL
AST: 18 U/L (ref 0–37)
Albumin: 3.8 g/dL (ref 3.5–5.2)
Alkaline Phosphatase: 79 U/L (ref 39–117)
Bilirubin, Direct: 0.1 mg/dL (ref 0.0–0.3)
Total Protein: 6.9 g/dL (ref 6.0–8.3)

## 2012-02-10 LAB — LIPID PANEL: Cholesterol: 126 mg/dL (ref 0–200)

## 2012-03-01 ENCOUNTER — Ambulatory Visit: Payer: Self-pay | Admitting: Internal Medicine

## 2012-03-05 ENCOUNTER — Ambulatory Visit: Payer: Self-pay | Admitting: Nurse Practitioner

## 2012-03-13 ENCOUNTER — Other Ambulatory Visit: Payer: Self-pay | Admitting: Urology

## 2012-03-21 ENCOUNTER — Encounter (HOSPITAL_BASED_OUTPATIENT_CLINIC_OR_DEPARTMENT_OTHER): Payer: Self-pay | Admitting: *Deleted

## 2012-03-21 NOTE — Progress Notes (Addendum)
Pt instructed npo p mn 1/26 .  To [facility] 1/28 @ 0945.  Needs istat, kub on arrival.  Pt states he was told not to stop asa . Pt to bring CPAP .

## 2012-03-23 NOTE — H&P (Signed)
eason For Visit     Mr. Coachman is a 52 year old male patient seen for gross hematuria with a history of nephrolithiasis   History of Present Illness        He experienced gross painless hematuria beginning 02/21/12. This was associated with some frequency and he reported some transient dysuria.  Imaging studies: 01/05/12 renal ultrasound - bilateral renal cysts but no mass. Increased echogenicity consistent with medical renal disease 03/01/12 CT scan without contrast - bilateral nephrolithiasis, bilateral renal cysts and calcified left cortical lesion. 03/05/12 renal ultrasound - bilateral renal cysts, mild left renal atrophy and increased echogenicity.  He reports that beginning on 02/20/12 he began experiencing gross, painless hematuria. He said he wasn't feeling well but was not having any flank pain. He has passed multiple stones over the years it never saw a stone pass. His gross hematuria persisted for one week. This prompted his CT scan as well as followup renal ultrasound as above. He does take Effient and aspirin. He has not been a smoker. He also has no other risk factors for urothelial malignancy.  He has bilateral nephrolithiasis and reports that he has passed 5-6 stones over the years. He began passing stones in 1998. In addition he has been found to have a mildly elevated creatinine of 1.5.   Past Medical History Problems  1. History of  Adult Sleep Apnea 780.57 2. History of  Coronary Artery Disease V12.59 3. History of  Glaucoma 365.9 4. History of  Gout 274.9 5. History of  Hypercholesterolemia 272.0 6. History of  Hypertension 401.9 7. History of  Renal Insufficiency 593.9  Surgical History Problems  1. History of  Cath Stent Placement  Current Meds 1. Allopurinol 300 MG Oral Tablet; Therapy: [id number] to 2. AmLODIPine Besylate 5 MG Oral Tablet; Therapy: 20Dec2013 to 3. Aspirin Low Dose 81 MG Oral Tablet; Therapy: (Recorded:14Jan2014) to 4. Atorvastatin Calcium 40 MG Oral  Tablet; Therapy: [id number] to 5. Bisoprolol Fumarate 5 MG Oral Tablet; Therapy: 03Oct2013 to 6. Clopidogrel Bisulfate 75 MG Oral Tablet; Therapy: 12Dec2013 to 7. Effient 10 MG Oral Tablet; Therapy: [id number] to 8. Gemfibrozil 600 MG Oral Tablet; Therapy: [id number] to 9. Lisinopril 10 MG Oral Tablet; Therapy: [id number] to 10. Lumigan 0.01 % Ophthalmic Solution; Therapy: [id number] to 11. Timolol Maleate 0.5 % Ophthalmic Solution; Therapy: (Recorded:14Jan2014) to 12. Zioptan 0.0015 % Ophthalmic Solution; Therapy: [id number] to  Allergies Medication  1. No Known Drug Allergies  Family History Problems  1. Family history of  No Significant Family History  Social History Problems    Marital History - Currently Married   Never A Smoker  Review of Systems Genitourinary, constitutional, skin, eye, otolaryngeal, hematologic/lymphatic, cardiovascular, pulmonary, endocrine, musculoskeletal, gastrointestinal, neurological and psychiatric system(s) were reviewed and pertinent findings if present are noted.  Genitourinary: urinary stream starts and stops and hematuria.  Gastrointestinal: nausea and heartburn.  Integumentary: pruritus.  Respiratory: cough.    Vitals Vital Signs  BMI Calculated: 37.89 BSA Calculated: 2.25 Height: 5 ft 8 in Weight: 250 lb  Blood Pressure: 134 / 71 Heart Rate: 46  Physical Exam Constitutional: Well nourished and well developed . No acute distress.  ENT:. The ears and nose are normal in appearance.  Neck: The appearance of the neck is normal and no neck mass is present.  Pulmonary: No respiratory distress and normal respiratory rhythm and effort.  Cardiovascular: Heart rate and rhythm are normal . No peripheral edema.  Abdomen: The abdomen is soft and nontender. No masses are palpated. No  CVA tenderness. No hernias are palpable. No hepatosplenomegaly noted.  Lymphatics: The femoral and inguinal nodes are not enlarged or tender.  Skin: Normal skin turgor,  no visible rash and no visible skin lesions.  Neuro/Psych:. Mood and affect are appropriate.    Results/Data Urine  COLOR YELLOW   APPEARANCE CLEAR   SPECIFIC GRAVITY 1.025   pH 6.0   GLUCOSE NEG mg/dL  BILIRUBIN NEG   KETONE NEG mg/dL  BLOOD TRACE   PROTEIN 100 mg/dL  UROBILINOGEN 0.2 mg/dL  NITRITE NEG   LEUKOCYTE ESTERASE NEG   SQUAMOUS EPITHELIAL/HPF RARE   WBC 0-2 WBC/hpf  RBC 0-2 RBC/hpf  BACTERIA RARE   CRYSTALS NONE SEEN   CASTS NONE SEEN    Old records or history reviewed: Notes from [employer] [facility] office as above.  The following clinical lab reports were reviewed:  UA: Clear.  The following radiology reports were reviewed: Renal ultrasound and CT scan results as above.    Assessment Assessed  1. Nephrolithiasis Of Both Kidneys 592.0 2. Gross Hematuria 599.71 3. Multiple Renal Cysts In Both Kidneys 593.2      I have discussed with him the fact that he most likely passed a stone. Because he's taking Effient this increases the risk of gross hematuria associated with a stone however he had very little pain to suggest a stone and I told him that there was a very small possibility of urothelial malignancy as a cause of his gross hematuria that has not been evaluated thoroughly enough with a noncontrasted CT scan. This would also require cystoscopy and therefore will I have recommended he undergo cystoscopy and bilateral retrograde pyelograms under anesthesia. He is on Effient and aspirin do to a coated cardiac stent and therefore cannot come off the Effient so he is at an increased risk of bleeding after the procedure. He understands this is the case but it is my feeling that the risk of stopping his Effient is greater than the risk of bleeding from the procedure. I have discussed the procedure with him in detail including its risks and complications and the alternatives. He understands and has elected to proceed. I  will   Plan    He will be scheduled for cystoscopy to  evaluate his bladder and bilateral retrograde pyelograms with ureteroscopy as indicated. I have discussed with him the fact that it is very possible that he may have passed a stone and with him on Effient

## 2012-03-26 ENCOUNTER — Encounter (HOSPITAL_BASED_OUTPATIENT_CLINIC_OR_DEPARTMENT_OTHER): Payer: Self-pay | Admitting: Anesthesiology

## 2012-03-26 ENCOUNTER — Ambulatory Visit (HOSPITAL_BASED_OUTPATIENT_CLINIC_OR_DEPARTMENT_OTHER): Payer: 59 | Admitting: Anesthesiology

## 2012-03-26 ENCOUNTER — Ambulatory Visit (HOSPITAL_BASED_OUTPATIENT_CLINIC_OR_DEPARTMENT_OTHER)
Admission: RE | Admit: 2012-03-26 | Discharge: 2012-03-26 | Disposition: A | Discharge status: Home / Self Care | Payer: 59 | Source: Ambulatory Visit | Attending: Urology | Admitting: Urology

## 2012-03-26 ENCOUNTER — Encounter (HOSPITAL_BASED_OUTPATIENT_CLINIC_OR_DEPARTMENT_OTHER)
Admission: RE | Disposition: A | Discharge status: Home / Self Care | Payer: Self-pay | Source: Ambulatory Visit | Attending: Urology

## 2012-03-26 ENCOUNTER — Encounter (HOSPITAL_BASED_OUTPATIENT_CLINIC_OR_DEPARTMENT_OTHER): Payer: Self-pay | Admitting: *Deleted

## 2012-03-26 ENCOUNTER — Ambulatory Visit (HOSPITAL_COMMUNITY): Payer: 59

## 2012-03-26 DIAGNOSIS — N281 Cyst of kidney, acquired: Secondary | ICD-10-CM | POA: Insufficient documentation

## 2012-03-26 DIAGNOSIS — G4730 Sleep apnea, unspecified: Secondary | ICD-10-CM | POA: Insufficient documentation

## 2012-03-26 DIAGNOSIS — R310 Gross hematuria: Secondary | ICD-10-CM

## 2012-03-26 DIAGNOSIS — E7800 Pure hypercholesterolemia, unspecified: Secondary | ICD-10-CM | POA: Insufficient documentation

## 2012-03-26 DIAGNOSIS — Z87442 Personal history of urinary calculi: Secondary | ICD-10-CM | POA: Insufficient documentation

## 2012-03-26 DIAGNOSIS — Z7901 Long term (current) use of anticoagulants: Secondary | ICD-10-CM | POA: Insufficient documentation

## 2012-03-26 DIAGNOSIS — I10 Essential (primary) hypertension: Secondary | ICD-10-CM | POA: Insufficient documentation

## 2012-03-26 DIAGNOSIS — Z79899 Other long term (current) drug therapy: Secondary | ICD-10-CM | POA: Insufficient documentation

## 2012-03-26 HISTORY — PX: CYSTOSCOPY/RETROGRADE/URETEROSCOPY: SHX5316

## 2012-03-26 LAB — POCT I-STAT 4, (NA,K, GLUC, HGB,HCT)
Glucose, Bld: 98 mg/dL (ref 70–99)
HCT: 49 % (ref 39.0–52.0)
Hemoglobin: 16.7 g/dL (ref 13.0–17.0)
Potassium: 4 mEq/L (ref 3.5–5.1)
Sodium: 145 mEq/L (ref 135–145)

## 2012-03-26 SURGERY — CYSTOSCOPY/RETROGRADE/URETEROSCOPY
Anesthesia: General | Site: Bladder | Laterality: Bilateral | Wound class: Clean Contaminated

## 2012-03-26 MED ORDER — LIDOCAINE HCL (CARDIAC) 20 MG/ML IV SOLN
INTRAVENOUS | Status: DC | PRN
  Administered 2012-03-26: 80 mg via INTRAVENOUS

## 2012-03-26 MED ORDER — DEXAMETHASONE SODIUM PHOSPHATE 4 MG/ML IJ SOLN
INTRAMUSCULAR | Status: DC | PRN
  Administered 2012-03-26: 10 mg via INTRAVENOUS

## 2012-03-26 MED ORDER — ONDANSETRON HCL 4 MG/2ML IJ SOLN
INTRAMUSCULAR | Status: DC | PRN
  Administered 2012-03-26: 4 mg via INTRAVENOUS

## 2012-03-26 MED ORDER — IOHEXOL 350 MG/ML SOLN
INTRAVENOUS | Status: DC | PRN
  Administered 2012-03-26: 7 mL

## 2012-03-26 MED ORDER — MIDAZOLAM HCL 5 MG/5ML IJ SOLN
INTRAMUSCULAR | Status: DC | PRN
  Administered 2012-03-26: 2 mg via INTRAVENOUS

## 2012-03-26 MED ORDER — PHENAZOPYRIDINE HCL 200 MG PO TABS: 200.0000 mg | ORAL_TABLET | Freq: Three times a day (TID) | ORAL | Status: DC | PRN

## 2012-03-26 MED ORDER — PROPOFOL 10 MG/ML IV BOLUS
INTRAVENOUS | Status: DC | PRN
  Administered 2012-03-26: 280 mg via INTRAVENOUS
  Administered 2012-03-26: 70 mg via INTRAVENOUS
  Administered 2012-03-26: 50 mg via INTRAVENOUS

## 2012-03-26 MED ORDER — LIDOCAINE HCL 2 % EX GEL
CUTANEOUS | Status: DC | PRN
  Administered 2012-03-26: 1 via URETHRAL

## 2012-03-26 MED ORDER — LACTATED RINGERS IV SOLN
INTRAVENOUS | Status: DC
  Administered 2012-03-26: 11:00:00 via INTRAVENOUS
  Filled 2012-03-26: qty 1000

## 2012-03-26 MED ORDER — FENTANYL CITRATE 0.05 MG/ML IJ SOLN
INTRAMUSCULAR | Status: DC | PRN
  Administered 2012-03-26 (×2): 50 ug via INTRAVENOUS

## 2012-03-26 MED ORDER — ACETAMINOPHEN 10 MG/ML IV SOLN
INTRAVENOUS | Status: DC | PRN
  Administered 2012-03-26: 1000 mg via INTRAVENOUS

## 2012-03-26 MED ORDER — CIPROFLOXACIN IN D5W 200 MG/100ML IV SOLN
200.0000 mg | INTRAVENOUS | Status: AC
  Administered 2012-03-26: 200 mg via INTRAVENOUS
  Filled 2012-03-26: qty 100

## 2012-03-26 MED ORDER — SODIUM CHLORIDE 0.9 % IR SOLN
Status: DC | PRN
  Administered 2012-03-26: 3000 mL via INTRAVESICAL

## 2012-03-26 MED ORDER — PHENAZOPYRIDINE HCL 200 MG PO TABS
200.0000 mg | ORAL_TABLET | Freq: Once | ORAL | Status: AC
  Administered 2012-03-26: 200 mg via ORAL
  Filled 2012-03-26: qty 1

## 2012-03-26 SURGICAL SUPPLY — 35 items
ADAPTER CATH URET PLST 4-6FR (CATHETERS) IMPLANT
BAG DRAIN URO-CYSTO SKYTR STRL (DRAIN) ×2 IMPLANT
BASKET LASER NITINOL 1.9FR (BASKET) IMPLANT
BASKET SEGURA 3FR (UROLOGICAL SUPPLIES) IMPLANT
BASKET STNLS GEMINI 4WIRE 3FR (BASKET) IMPLANT
BASKET ZERO TIP NITINOL 2.4FR (BASKET) IMPLANT
BRUSH URET BIOPSY 3F (UROLOGICAL SUPPLIES) IMPLANT
CANISTER SUCT LVC 12 LTR MEDI- (MISCELLANEOUS) ×2 IMPLANT
CATH INTERMIT  6FR 70CM (CATHETERS) ×2 IMPLANT
CATH URET 5FR 28IN CONE TIP (BALLOONS)
CATH URET 5FR 70CM CONE TIP (BALLOONS) IMPLANT
CLOTH BEACON ORANGE TIMEOUT ST (SAFETY) ×2 IMPLANT
DRAPE CAMERA CLOSED 9X96 (DRAPES) ×2 IMPLANT
ELECT REM PT RETURN 9FT ADLT (ELECTROSURGICAL) ×2
ELECTRODE REM PT RTRN 9FT ADLT (ELECTROSURGICAL) ×1 IMPLANT
GLOVE BIO SURGEON STRL SZ8 (GLOVE) ×2 IMPLANT
GLOVE INDICATOR 7.0 STRL GRN (GLOVE) ×4 IMPLANT
GOWN STRL REIN XL XLG (GOWN DISPOSABLE) ×2 IMPLANT
GOWN SURGICAL LARGE (GOWNS) ×2 IMPLANT
GOWN XL W/COTTON TOWEL STD (GOWNS) ×2 IMPLANT
GUIDEWIRE 0.038 PTFE COATED (WIRE) ×2 IMPLANT
GUIDEWIRE ANG ZIPWIRE 038X150 (WIRE) IMPLANT
GUIDEWIRE STR DUAL SENSOR (WIRE) ×2 IMPLANT
IV NS IRRIG 3000ML ARTHROMATIC (IV SOLUTION) ×4 IMPLANT
KIT BALLIN UROMAX 15FX10 (LABEL) IMPLANT
KIT BALLN UROMAX 15FX4 (MISCELLANEOUS) IMPLANT
KIT BALLN UROMAX 26 75X4 (MISCELLANEOUS)
LASER FIBER DISP (UROLOGICAL SUPPLIES) IMPLANT
LASER FIBER DISP 1000U (UROLOGICAL SUPPLIES) IMPLANT
PACK CYSTOSCOPY (CUSTOM PROCEDURE TRAY) ×2 IMPLANT
SET HIGH PRES BAL DIL (LABEL)
SHEATH ACCESS URETERAL 38CM (SHEATH) IMPLANT
SHEATH ACCESS URETERAL 54CM (SHEATH) IMPLANT
SYRINGE IRR TOOMEY STRL 70CC (SYRINGE) IMPLANT
WATER STERILE IRR 3000ML UROMA (IV SOLUTION) IMPLANT

## 2012-03-26 NOTE — Anesthesia Preprocedure Evaluation (Addendum)
Anesthesia Evaluation  Patient identified by MRN, date of birth, ID band Patient awake    Reviewed: Allergy & Precautions, H&P , NPO status , Patient's Chart, lab work & pertinent test results  Airway Mallampati: II TM Distance: >3 FB Neck ROM: Full    Dental No notable dental hx.    Pulmonary sleep apnea ,  breath sounds clear to auscultation  Pulmonary exam normal       Cardiovascular Exercise Tolerance: Good hypertension, Pt. on medications and Pt. on home beta blockers + CAD and + Cardiac Stents Rhythm:Regular Rate:Normal  One stent. No recent chest pain.   Neuro/Psych negative neurological ROS  negative psych ROS   GI/Hepatic negative GI ROS, Neg liver ROS,   Endo/Other  negative endocrine ROS  Renal/GU Renal InsufficiencyRenal disease  negative genitourinary   Musculoskeletal negative musculoskeletal ROS (+)   Abdominal (+) + obese,   Peds negative pediatric ROS (+)  Hematology negative hematology ROS (+)   Anesthesia Other Findings   Reproductive/Obstetrics negative OB ROS                          Anesthesia Physical Anesthesia Plan  ASA: III  Anesthesia Plan: General   Post-op Pain Management:    Induction: Intravenous  Airway Management Planned: LMA  Additional Equipment:   Intra-op Plan:   Post-operative Plan: Extubation in OR  Informed Consent: I have reviewed the patients History and Physical, chart, labs and discussed the procedure including the risks, benefits and alternatives for the proposed anesthesia with the patient or authorized representative who has indicated his/her understanding and acceptance.   Dental advisory given  Plan Discussed with: CRNA  Anesthesia Plan Comments:         Anesthesia Quick Evaluation

## 2012-03-26 NOTE — Op Note (Signed)
PATIENT:  Daniel Solomon  PRE-OPERATIVE DIAGNOSIS: 1. Gross hematuria 2. History of nephrolithiasis  POST-OPERATIVE DIAGNOSIS: Same  PROCEDURE:  1. Cystoscopy with bilateral retrograde pyelogram including interpretation  SURGEON: [name], MD  INDICATION: [name] is a 52 year old male patient on anticoagulant therapy who developed gross, painless hematuria. He has a history of calculus disease but did not see any stones past nor did he have significant flank pain. Since I saw him last he has not seen any further gross hematuria and he remains on his full anticoagulation. A preoperative KUB reveals a density overlying the midportion of the left kidney approximately 4 mm in size and has the appearance of a possible left renal calculus.  ANESTHESIA:  General  EBL:  Minimal  DRAINS: None  SPECIMEN:  None  DISPOSITION OF SPECIMEN:  N/A  DESCRIPTION OF PROCEDURE: The patient was taken to the major OR and placed on the table. General anesthesia was administered and then the patient was moved to the dorsal lithotomy position. The genitalia was sterilely prepped and draped. An official timeout was performed.  Initially the 28 French cystoscope with 12 lens was passed under direct vision down the urethra which is noted be entirely normal. The prostatic urethra and no lesions. He had a relatively high bladder neck. The bladder was then entered and fully inspected with both the 12 and 70 lenses. It was noted be free of any tumors stones or inflammatory lesions. Ureteral orifices were of normal configuration and position. A 6 French open-ended ureteral catheter was then passed through the cystoscope into the ureteral orifice in order to perform a right retrograde pyelogram.  A retrograde pyelogram was performed by injecting full-strength contrast up the right ureter under direct fluoroscopic control. It revealed a normal ureter without mass effect, filling defect or dilatation throughout the  length. The intrarenal collecting system including the renal pelvis and a all of the calyces appeared entirely normal as well. I then directed my attention to the left ureteral orifice and a left retrograde pyelogram was performed in an identical fashion. The ureter on the left side was noted be entirely normal throughout its length as well. He had no mass effect or filling defects. On the left-hand side the intrarenal collecting system was also noted be entirely normal. I could not identify the calcification seen on the preoperative KUB as a filling defect on the study. I watched the contrast passed down both of the ureters under direct fluoroscopy and noted no abnormality throughout the length of each ureter.   The bladder was drained and the cystoscope was then removed. 2% lidocaine jelly was then instilled in the urethra and a penile clamp was applied. The patient tolerated the procedure well no intraoperative complications.  PLAN OF CARE: Discharge to home after PACU  PATIENT DISPOSITION:  PACU - hemodynamically stable.

## 2012-03-26 NOTE — Transfer of Care (Signed)
Immediate Anesthesia Transfer of Care Note  Patient: Daniel Solomon  Procedure(s) Performed: Procedure(s) (LRB) with comments: CYSTOSCOPY/RETROGRADE/URETEROSCOPY (Bilateral) - CYSTOSCOPY BILATERAL RETROGRADE PYELOGRAM AND POSSIBLE URETEROSCOPY  Patient Location: PACU  Anesthesia Type:General  Level of Consciousness: sedated and responds to stimulation  Airway & Oxygen Therapy: Patient Spontanous Breathing and Patient connected to nasal cannula oxygen  Post-op Assessment: Report given to PACU RN  Post vital signs: Reviewed and stable  Complications: No apparent anesthesia complications

## 2012-03-26 NOTE — Anesthesia Postprocedure Evaluation (Signed)
  Anesthesia Post-op Note  Patient: Daniel Solomon  Procedure(s) Performed: Procedure(s) (LRB): CYSTOSCOPY/RETROGRADE/URETEROSCOPY (Bilateral)  Patient Location: PACU  Anesthesia Type: General  Level of Consciousness: awake and alert   Airway and Oxygen Therapy: Patient Spontanous Breathing  Post-op Pain: mild  Post-op Assessment: Post-op Vital signs reviewed, Patient's Cardiovascular Status Stable, Respiratory Function Stable, Patent Airway and No signs of Nausea or vomiting  Last Vitals:  Filed Vitals:   03/26/12 1235  BP:   Pulse: 62  Temp: 36.1 C  Resp: 13    Post-op Vital Signs: stable   Complications: No apparent anesthesia complications

## 2012-03-26 NOTE — Anesthesia Procedure Notes (Signed)
Procedure Name: LMA Insertion Date/Time: 03/26/2012 12:08 PM Performed by: Bethena Roys T Pre-anesthesia Checklist: Patient identified, Emergency Drugs available, Suction available and Patient being monitored Patient Re-evaluated:Patient Re-evaluated prior to inductionOxygen Delivery Method: Circle System Utilized Preoxygenation: Pre-oxygenation with 100% oxygen Intubation Type: IV induction Ventilation: Mask ventilation without difficulty LMA: LMA inserted LMA Size: 5.0 Number of attempts: 1 Airway Equipment and Method: bite block Placement Confirmation: positive ETCO2 Dental Injury: Teeth and Oropharynx as per pre-operative assessment

## 2012-03-26 NOTE — Progress Notes (Signed)
Attempt to pass foley catheter unsuccessful - pt had a lot of pain, and no ur ine return.  Balloon not inflated.  Dr Karsten Ro informed. Will come to see pt.

## 2012-03-26 NOTE — Interval H&P Note (Signed)
History and Physical Interval Note:  03/26/2012 10:43 AM  Daniel Solomon  has presented today for surgery, with the diagnosis of GROSS HEMATURIA  The various methods of treatment have been discussed with the patient and family. After consideration of risks, benefits and other options for treatment, the patient has consented to  Procedure(s) (LRB) with comments: CYSTOSCOPY/RETROGRADE/URETEROSCOPY (Bilateral) - CYSTOSCOPY BILATERAL RETROGRADE PYELOGRAM AND POSSIBLE URETEROSCOPY as a surgical intervention .  The patient's history has been reviewed, patient examined, no change in status, stable for surgery.  I have reviewed the patient's chart and labs.  Questions were answered to the patient's satisfaction.     [name]

## 2012-03-27 ENCOUNTER — Encounter (HOSPITAL_BASED_OUTPATIENT_CLINIC_OR_DEPARTMENT_OTHER): Payer: Self-pay | Admitting: Urology

## 2012-09-06 ENCOUNTER — Ambulatory Visit (INDEPENDENT_AMBULATORY_CARE_PROVIDER_SITE_OTHER): Payer: 59 | Admitting: Cardiology

## 2012-09-06 ENCOUNTER — Encounter: Payer: Self-pay | Admitting: Cardiology

## 2012-09-06 VITALS — BP 118/70 | HR 47 | Ht 68.0 in | Wt 231.0 lb

## 2012-09-06 DIAGNOSIS — I498 Other specified cardiac arrhythmias: Secondary | ICD-10-CM

## 2012-09-06 DIAGNOSIS — G4733 Obstructive sleep apnea (adult) (pediatric): Secondary | ICD-10-CM

## 2012-09-06 DIAGNOSIS — R0989 Other specified symptoms and signs involving the circulatory and respiratory systems: Secondary | ICD-10-CM

## 2012-09-06 DIAGNOSIS — N289 Disorder of kidney and ureter, unspecified: Secondary | ICD-10-CM

## 2012-09-06 DIAGNOSIS — I10 Essential (primary) hypertension: Secondary | ICD-10-CM

## 2012-09-06 DIAGNOSIS — R001 Bradycardia, unspecified: Secondary | ICD-10-CM

## 2012-09-06 DIAGNOSIS — I2510 Atherosclerotic heart disease of native coronary artery without angina pectoris: Secondary | ICD-10-CM

## 2012-09-06 NOTE — Progress Notes (Signed)
HPI Daniel Solomon comes in today for further evaluation and management of his history of coronary artery disease. He is doing well with no angina or ischemic symptoms. He is on aggressive secondary preventative program. Labs are followed Dr. Gaylan Gerold of  primary care.  Past Medical History  Diagnosis Date  . Hypertension   . Hypercholesterolemia     With hypertriglyceridemia  . Glaucoma   . Kidney stones   . Gout   . CAD (coronary artery disease)     proximal LAD 50%, mid LAD 50-60%, mid to distal LAD 99%; mid circumflex 20%; proximal RCA 20%, distal RCA 50%; PDA 60-70%, proximal posterior AV groove 50%, proximal posterior lateral 50%.  There was a question of possible LVOT gradient;  s/p Resolute DES to mid-distal LAD 10/2010;  echocardiogram 11/17/10: EF 55-60%, mild LVH, grade 1 diastolic dysfunction, normal aortic valve, mild MR, PASP 32   . Renal insufficiency   . Hx of hematuria     Current Outpatient Prescriptions  Medication Sig Dispense Refill  . allopurinol (ZYLOPRIM) 300 MG tablet Take 300 mg by mouth daily.        [name] amLODipine (NORVASC) 5 MG tablet Take 5 mg by mouth daily.      [name] aspirin 81 MG tablet Take 81 mg by mouth daily.        [name] atorvastatin (LIPITOR) 40 MG tablet Take 1 tablet (40 mg total) by mouth daily.  90 tablet  3  . bisoprolol (ZEBETA) 5 MG tablet Take 1/2 tablet daily  45 tablet  3  . clopidogrel (PLAVIX) 75 MG tablet Take 1 tablet (75 mg total) by mouth daily.  90 tablet  3  . gemfibrozil (LOPID) 600 MG tablet Take 1 tablet (600 mg total) by mouth 2 (two) times daily before a meal.  180 tablet  3  . lisinopril (PRINIVIL,ZESTRIL) 10 MG tablet Take 1 tablet (10 mg total) by mouth daily.  90 tablet  3  . Tafluprost (ZIOPTAN) 0.0015 % SOLN Apply to eye. One drop in both eyes at night      . TIMOLOL MALEATE OP Apply 6.8 mg/mL to eye every morning.       No current facility-administered medications for this visit.    No Known Allergies  Family History  Problem  Relation Age of Onset  . Hypertension Father   . Heart disease Father   . Valvular heart disease Mother     History   Social History  . Marital Status: Married    Spouse Name: N/A    Number of Children: 4  . Years of Education: N/A   Occupational History  . Financial planner    Social History Main Topics  . Smoking status: Never Smoker   . Smokeless tobacco: Never Used  . Alcohol Use: No  . Drug Use: No  . Sexually Active: Not on file   Other Topics Concern  . Not on file   Social History Narrative  . No narrative on file    ROS ALL NEGATIVE EXCEPT THOSE NOTED IN HPI  PE  General Appearance: well developed, well nourished in no acute distress, obese HEENT: symmetrical face, PERRLA, good dentition  Neck: no JVD, thyromegaly, or adenopathy, trachea midline Chest: symmetric without deformity Cardiac: PMI non-displaced, RRR, normal S1, S2, no gallop or murmur Lung: clear to ausculation and percussion Vascular: all pulses full without bruits  Abdominal: nondistended, nontender, good bowel sounds, no HSM, no bruits Extremities: no cyanosis, clubbing or edema, no sign  of DVT, no varicosities  Skin: normal color, no rashes Neuro: alert and oriented x 3, non-focal Pysch: normal affect  EKG BMET    Component Value Date/Time   NA 145 03/26/2012 1054   K 4.0 03/26/2012 1054   CL 110 12/06/2010 1618   CO2 26 12/06/2010 1618   GLUCOSE 98 03/26/2012 1054   BUN 24* 12/06/2010 1618   CREATININE 1.5 12/06/2010 1618   CALCIUM 8.9 12/06/2010 1618   GFRNONAA >60 11/18/2010 0505   GFRAA >60 11/18/2010 0505    Lipid Panel     Component Value Date/Time   CHOL 126 02/10/2012 0737   TRIG 250.0* 02/10/2012 0737   HDL 32.20* 02/10/2012 0737   CHOLHDL 4 02/10/2012 0737   VLDL 50.0* 02/10/2012 0737   LDLCALC 63 01/17/2011 0852    CBC    Component Value Date/Time   WBC 8.3 11/18/2010 0505   RBC 5.08 11/18/2010 0505   HGB 16.7 03/26/2012 1054   HCT 49.0 03/26/2012 1054   PLT 158  11/18/2010 0505   MCV 91.1 11/18/2010 0505   MCH 31.9 11/18/2010 0505   MCHC 35.0 11/18/2010 0505   RDW 12.8 11/18/2010 0505

## 2012-09-06 NOTE — Assessment & Plan Note (Signed)
Stable. Continue secondary preventative therapy. Followup with Dr. Marius Ditch in one year

## 2012-09-06 NOTE — Assessment & Plan Note (Signed)
Nonobstructive plaque by carotid Dopplers. Continue secondary preventative therapy. Repeat carotid Dopplers in one year.

## 2012-09-06 NOTE — Assessment & Plan Note (Signed)
Sinus bradycardia is asymptomatic. Patient wishes not to stop bisoprolol because of his blood pressure. He will let us know if he becomes symptomatic.

## 2012-09-06 NOTE — Patient Instructions (Addendum)
Your physician wants you to follow-up in:  1 year with Dr. Darlina Guys.  You will receive a reminder letter in the mail two months in advance. If you don't receive a letter, please call our office to schedule the follow-up appointment.  Your physician recommends that you continue on your current medications as directed. Please refer to the Current Medication list given to you today.

## 2013-02-19 ENCOUNTER — Telehealth: Payer: Self-pay | Admitting: *Deleted

## 2013-02-19 NOTE — Telephone Encounter (Signed)
Pt. wants Clopidogrel refill, not in med list anymore. They saw Dr. Verl Blalock in July with no changes, sending to you because med expired this month and I can't refill this. Thanks

## 2013-02-20 ENCOUNTER — Other Ambulatory Visit: Payer: Self-pay

## 2013-02-20 MED ORDER — CLOPIDOGREL BISULFATE 75 MG PO TABS: 75.0000 mg | ORAL_TABLET | Freq: Every day | ORAL | Status: DC

## 2013-02-22 ENCOUNTER — Other Ambulatory Visit: Payer: Self-pay

## 2013-02-22 DIAGNOSIS — I10 Essential (primary) hypertension: Secondary | ICD-10-CM

## 2013-02-22 DIAGNOSIS — I2510 Atherosclerotic heart disease of native coronary artery without angina pectoris: Secondary | ICD-10-CM

## 2013-02-22 MED ORDER — LISINOPRIL 10 MG PO TABS: 10.0000 mg | ORAL_TABLET | Freq: Every day | ORAL | Status: DC

## 2013-02-22 MED ORDER — BISOPROLOL FUMARATE 5 MG PO TABS: ORAL_TABLET | ORAL | Status: DC

## 2013-09-05 NOTE — Progress Notes (Signed)
History of Present Illness: 53 yo male with history of CAD, HTN, HLD here today for cardiac follow up. He has been followed by Dr. Verl Blalock. He was admitted with unstable angina in September 2012 and was found to have severe stenosis in the mid to distal LAD treated with a 2.25 x 18 mm Resolute DES. He was also found to have moderate mid LAD and distal RCA disease.   He has been feeling well. No chest pain, SOB, palpitations, dizziness, LE edema.   Primary Care Physician: Domenick Gong  Last Lipid Profile: Followed in primary care.   Past Medical History  Diagnosis Date  . Hypertension   . Hypercholesterolemia     With hypertriglyceridemia  . Glaucoma   . Kidney stones   . Gout   . CAD (coronary artery disease)     proximal LAD 50%, mid LAD 50-60%, mid to distal LAD 99%; mid circumflex 20%; proximal RCA 20%, distal RCA 50%; PDA 60-70%, proximal posterior AV groove 50%, proximal posterior lateral 50%.  There was a question of possible LVOT gradient;  s/p Resolute DES to mid-distal LAD 10/2010;  echocardiogram 11/17/10: EF 55-60%, mild LVH, grade 1 diastolic dysfunction, normal aortic valve, mild MR, PASP 32   . Renal insufficiency   . Hx of hematuria     Past Surgical History  Procedure Laterality Date  . Coronary stent placement  2012  . Coronary angioplasty    . Cystoscopy/retrograde/ureteroscopy  03/26/2012    Procedure: CYSTOSCOPY/RETROGRADE/URETEROSCOPY;  Surgeon: [name], MD;  Location: [facility];  Service: Urology;  Laterality: Bilateral;  CYSTOSCOPY BILATERAL RETROGRADE PYELOGRAM AND POSSIBLE URETEROSCOPY    Current Outpatient Prescriptions  Medication Sig Dispense Refill  . allopurinol (ZYLOPRIM) 300 MG tablet Take 300 mg by mouth daily.        [name] amLODipine (NORVASC) 5 MG tablet Take 5 mg by mouth daily.      [name] aspirin 81 MG tablet Take 81 mg by mouth daily.        [name] atorvastatin (LIPITOR) 40 MG tablet Take 1 tablet (40 mg total) by mouth daily.  90  tablet  3  . bisoprolol (ZEBETA) 5 MG tablet Take 1/2 tablet daily  45 tablet  1  . clopidogrel (PLAVIX) 75 MG tablet Take 1 tablet (75 mg total) by mouth daily.  90 tablet  3  . gemfibrozil (LOPID) 600 MG tablet Take 1 tablet (600 mg total) by mouth 2 (two) times daily before a meal.  180 tablet  3  . lisinopril (PRINIVIL,ZESTRIL) 10 MG tablet Take 1 tablet (10 mg total) by mouth daily.  90 tablet  2  . Tafluprost (ZIOPTAN) 0.0015 % SOLN Apply to eye. One drop in both eyes at night      . TIMOLOL MALEATE OP Apply 6.8 mg/mL to eye every morning.       No current facility-administered medications for this visit.    No Known Allergies  History   Social History  . Marital Status: Married    Spouse Name: N/A    Number of Children: 4  . Years of Education: N/A   Occupational History  . Financial planner    Social History Main Topics  . Smoking status: Never Smoker   . Smokeless tobacco: Never Used  . Alcohol Use: No  . Drug Use: No  . Sexual Activity: Not on file   Other Topics Concern  . Not on file   Social History Narrative  .  No narrative on file    Family History  Problem Relation Age of Onset  . Hypertension Father   . CAD Father     CABG  . Valvular heart disease Mother     AVR July 2015    Review of Systems:  As stated in the HPI and otherwise negative.   BP 120/70  Pulse 44  Ht 5' 8.75" (1.746 m)  Wt 207 lb (93.895 kg)  BMI 30.80 kg/m2  Physical Examination: General: Well developed, well nourished, NAD HEENT: OP clear, mucus membranes moist SKIN: warm, dry. No rashes. Neuro: No focal deficits Musculoskeletal: Muscle strength 5/5 all ext Psychiatric: Mood and affect normal Neck: No JVD, no carotid bruits, no thyromegaly, no lymphadenopathy. Lungs:Clear bilaterally, no wheezes, rhonci, crackles Cardiovascular: Regular rate and rhythm. Systolic  Murmur. No gallops or rubs. Abdomen:Soft. Bowel sounds present. Non-tender.  Extremities: No lower  extremity edema. Pulses are 2 + in the bilateral DP/PT.  Cardiac cath 11/17/10: 1. Left main coronary artery: The vessel is normal in size and free  of significant disease.  2. Left anterior descending artery: The vessel is normal in size with  disease noted throughout its course. Proximally, just before the  first septal branch, there is 50% stenosis. In the mid segment  right after second diagonal, there is another 50%-60% eccentric  lesion. In the mid-to-distal LAD before the third diagonal, there  is a 99% tubular stenosis. First diagonal is overall small in size  and free of significant disease. Second diagonal is normal in size  and free of significant disease. Third diagonal is very small in  size.  3. Left circumflex artery: The vessel is normal in size but  nondominant. It has minor irregularities without obstructive  disease. There is a 20% lesion in the mid segment right after OM- 1.  4. Right coronary artery: The vessel is very large in size and  dominant. There is a 20% proximal stenosis. There is minor  irregularities in the mid segment. Distally, right at PDA and the  AV groove bifurcation, there is a 50% stenosis. The PDA itself is  medium in size and diffusely diseased throughout its course with  60%-70% disease noted with plaque formation. The distal area is  too small. The posterior AV groove has a 50% proximal lesion and  it gives a large posterolateral branch which has a 50% proximal  stenosis. The second posterolateral branch is small in size and  free of significant disease.  EKG: sinus brady, rate 44 bpm.   Assessment and Plan:   1. CAD: Stable. He is on good medical therapy. Continue ASA, Plavix, beta blocker, statin.   2. HTN: BP controlled. NO changes.   3. HLD: Continue statin. Lipids followed in primary care.   4. Cardiac murmur: Mild MR by echo 2012. Will repeat echo now to assess.

## 2013-09-06 ENCOUNTER — Ambulatory Visit (INDEPENDENT_AMBULATORY_CARE_PROVIDER_SITE_OTHER): Payer: 59 | Admitting: Cardiovascular Disease

## 2013-09-06 ENCOUNTER — Encounter: Payer: Self-pay | Admitting: Cardiovascular Disease

## 2013-09-06 VITALS — BP 120/70 | HR 44 | Ht 68.75 in | Wt 207.0 lb

## 2013-09-06 DIAGNOSIS — E785 Hyperlipidemia, unspecified: Secondary | ICD-10-CM

## 2013-09-06 DIAGNOSIS — I10 Essential (primary) hypertension: Secondary | ICD-10-CM

## 2013-09-06 DIAGNOSIS — I2510 Atherosclerotic heart disease of native coronary artery without angina pectoris: Secondary | ICD-10-CM

## 2013-09-06 DIAGNOSIS — R011 Cardiac murmur, unspecified: Secondary | ICD-10-CM

## 2013-09-06 NOTE — Patient Instructions (Signed)

## 2013-10-15 ENCOUNTER — Ambulatory Visit (HOSPITAL_COMMUNITY): Payer: 59 | Attending: Cardiology | Admitting: Cardiology

## 2013-10-15 ENCOUNTER — Other Ambulatory Visit (HOSPITAL_COMMUNITY): Payer: 59

## 2013-10-15 DIAGNOSIS — I2510 Atherosclerotic heart disease of native coronary artery without angina pectoris: Secondary | ICD-10-CM | POA: Diagnosis present

## 2013-10-15 DIAGNOSIS — I359 Nonrheumatic aortic valve disorder, unspecified: Secondary | ICD-10-CM | POA: Diagnosis not present

## 2013-10-15 DIAGNOSIS — R011 Cardiac murmur, unspecified: Secondary | ICD-10-CM

## 2013-10-15 NOTE — Progress Notes (Signed)
Echo performed. 

## 2014-03-17 DIAGNOSIS — E7800 Pure hypercholesterolemia, unspecified: Secondary | ICD-10-CM | POA: Insufficient documentation

## 2014-03-17 LAB — BASIC METABOLIC PANEL
BUN: 21 mg/dL (ref 4–21)
CREATININE: 1.6 mg/dL — AB (ref 0.6–1.3)
Glucose: 85 mg/dL
POTASSIUM: 4.8 mmol/L (ref 3.4–5.3)
SODIUM: 141 mmol/L (ref 137–147)

## 2014-03-17 LAB — LIPID PANEL
CHOLESTEROL: 246 mg/dL — AB (ref 0–200)
HDL: 45 mg/dL (ref 35–70)
LDL Cholesterol: 167 mg/dL
TRIGLYCERIDES: 147 mg/dL (ref 40–160)

## 2014-03-17 LAB — CBC AND DIFFERENTIAL
HCT: 51 % (ref 41–53)
Hemoglobin: 17 g/dL (ref 13.5–17.5)
PLATELETS: 156 10*3/uL (ref 150–399)
WBC: 5.3 10*3/mL

## 2014-08-11 ENCOUNTER — Ambulatory Visit (INDEPENDENT_AMBULATORY_CARE_PROVIDER_SITE_OTHER): Payer: 59 | Admitting: Family Medicine

## 2014-08-11 ENCOUNTER — Encounter: Payer: Self-pay | Admitting: Family Medicine

## 2014-08-11 VITALS — BP 110/74 | HR 50 | Temp 98.5°F | Ht 68.0 in | Wt 220.0 lb

## 2014-08-11 DIAGNOSIS — M109 Gout, unspecified: Secondary | ICD-10-CM | POA: Insufficient documentation

## 2014-08-11 DIAGNOSIS — I2510 Atherosclerotic heart disease of native coronary artery without angina pectoris: Secondary | ICD-10-CM

## 2014-08-11 DIAGNOSIS — E782 Mixed hyperlipidemia: Secondary | ICD-10-CM | POA: Diagnosis not present

## 2014-08-11 DIAGNOSIS — Z1211 Encounter for screening for malignant neoplasm of colon: Secondary | ICD-10-CM | POA: Diagnosis not present

## 2014-08-11 DIAGNOSIS — I350 Nonrheumatic aortic (valve) stenosis: Secondary | ICD-10-CM | POA: Insufficient documentation

## 2014-08-11 DIAGNOSIS — I10 Essential (primary) hypertension: Secondary | ICD-10-CM

## 2014-08-11 DIAGNOSIS — N289 Disorder of kidney and ureter, unspecified: Secondary | ICD-10-CM

## 2014-08-11 MED ORDER — BISOPROLOL FUMARATE 5 MG PO TABS: ORAL_TABLET | ORAL | Status: DC

## 2014-08-11 MED ORDER — CLOPIDOGREL BISULFATE 75 MG PO TABS: 75.0000 mg | ORAL_TABLET | Freq: Every day | ORAL | Status: DC

## 2014-08-11 MED ORDER — AMLODIPINE BESYLATE 5 MG PO TABS: 5.0000 mg | ORAL_TABLET | Freq: Every day | ORAL | Status: DC

## 2014-08-11 MED ORDER — ALLOPURINOL 300 MG PO TABS: 300.0000 mg | ORAL_TABLET | Freq: Every day | ORAL | Status: DC

## 2014-08-11 NOTE — Patient Instructions (Addendum)
[facility] will call to schedule colonoscopy  See handout about potential benefits of statins. I would love for you to restart statin.   See if bisoprolol can be stopped- ask cardiology  Sign release of information at the front desk for records for labs and office notes for 3 years, any immunizations. Also sign release of information for Dr. Justin Mend for office notes for last 3 years.   Follow up around 7 months for full physical. Labs week before. Labs cannot be before 03/18/15

## 2014-08-11 NOTE — Assessment & Plan Note (Signed)
S: controlled on Lisinopril 10mg , bisoporolol 5mg  tablet (takes 1/4), amlodipine 5mg  A/P: bradycardic to 50 on 1.25 mg bisoprolol. Would strongly consider stopping. Cardiology started medication though and patient wants to check with them to make sure ok to stop.

## 2014-08-11 NOTE — Assessment & Plan Note (Signed)
S: Stent 2012. Followed by Dr. Verl Blalock, now Dr. Angelena Form. Compliant with plavis, asa, statin. Also on bisoprolol and taking 1/4 of a pill at this point.  A/P: I refilled his plavix. He is not impressed with secondary prevention data on statins- I gave him the uptodate medical side info on secondary prevention. He declines statin for now despite extensive counseling.  He fortunately is asymptomatic. Exercising 3x a week- encouraged to continue.

## 2014-08-11 NOTE — Progress Notes (Signed)
Daniel Reddish, MD Phone: [phone]  Subjective:  Patient presents today to establish care. Formerly patient of Dr. Ilene Qua of [facility] who retired. Chief complaint-noted.   See problem oriented charting pertinent negative ROS- no chest pain, shortness of breath, changes in urination pattern, hot, swollen, red joints.   The following were reviewed and entered/updated in epic: Past Medical History  Diagnosis Date  . Hypertension   . Hypercholesterolemia     With hypertriglyceridemia  . Glaucoma   . Kidney stones     hx hematuria as result. cystosopy normal  . Gout     allopurinol 300mg , colchicine prn in past, has not had flares  . CAD (coronary artery disease)     proximal LAD 50%, mid LAD 50-60%, mid to distal LAD 99%; mid circumflex 20%; proximal RCA 20%, distal RCA 50%; PDA 60-70%, proximal posterior AV groove 50%, proximal posterior lateral 50%.  There was a question of possible LVOT gradient;  s/p Resolute DES to mid-distal LAD 10/2010;  echocardiogram 11/17/10: EF 55-60%, mild LVH, grade 1 diastolic dysfunction, normal aortic valve, mild MR, PASP 32.   . Renal insufficiency   . Chicken pox    Patient Active Problem List   Diagnosis Date Noted  . CAD (coronary artery disease) 12/06/2010    Priority: High  . Gout     Priority: Medium  . OSA (obstructive sleep apnea) 12/06/2010    Priority: Medium  . Renal insufficiency 12/06/2010    Priority: Medium  . Mixed hyperlipidemia 12/06/2010    Priority: Medium  . HTN (hypertension) 12/06/2010    Priority: Medium  . Carotid bruit 02/09/2012    Priority: Low  . Glaucoma 12/06/2010    Priority: Low  . Mild aortic stenosis 08/11/2014  . Bradycardia 12/06/2010   Past Surgical History  Procedure Laterality Date  . Coronary stent placement  2012  . Cystoscopy/retrograde/ureteroscopy  03/26/2012    Procedure: CYSTOSCOPY/RETROGRADE/URETEROSCOPY;  Surgeon: [name], MD;  Location: [facility];   Service: Urology;  Laterality: Bilateral;  CYSTOSCOPY BILATERAL RETROGRADE PYELOGRAM AND POSSIBLE URETEROSCOPY    Family History  Problem Relation Age of Onset  . Hypertension Father   . CAD Father     CABG 31s  . Valvular heart disease Mother     AVR July 2015  . Kidney disease Father     Stage IV  . Diabetes Paternal Grandmother     Medications- reviewed and updated Current Outpatient Prescriptions  Medication Sig Dispense Refill  . allopurinol (ZYLOPRIM) 300 MG tablet Take 300 mg by mouth daily.      [name] amLODipine (NORVASC) 5 MG tablet Take 5 mg by mouth daily.    [name] aspirin 81 MG tablet Take 81 mg by mouth daily.      . bisoprolol (ZEBETA) 5 MG tablet Take 1/2 tablet daily 45 tablet 1  . clopidogrel (PLAVIX) 75 MG tablet Take 1 tablet (75 mg total) by mouth daily. 90 tablet 3  . gemfibrozil (LOPID) 600 MG tablet Take 1 tablet (600 mg total) by mouth 2 (two) times daily before a meal. 180 tablet 3  . Tafluprost (ZIOPTAN) 0.0015 % SOLN Apply to eye. One drop in both eyes at night    . TIMOLOL MALEATE OP Apply 6.8 mg/mL to eye every morning.     No current facility-administered medications for this visit.    Allergies-reviewed and updated No Known Allergies  History   Social History  . Marital Status: Married    Spouse Name:  N/A  . Number of Children: 4  . Years of Education: N/A   Occupational History  . Financial planner    Social History Main Topics  . Smoking status: Never Smoker   . Smokeless tobacco: Never Used  . Alcohol Use: No  . Drug Use: No  . Sexual Activity: Not on file   Other Topics Concern  . 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 Oropharynx normal. TM normal. Eyes: sclera and lids normal, PERRLA Neck: no thyromegaly, no cervical lymphadenopathy CV: 3/6 SEM no murmurs rubs or gallops Lungs: CTAB no crackles, wheeze, rhonchi Abdomen: soft/nontender/nondistended/normal bowel sounds. No rebound or guarding.  Ext: no edema Skin: warm, dry Neuro: 5/5 strength in upper and lower extremities, normal gait, normal reflexes  Assessment/Plan:  CAD (coronary artery disease) S: Stent 2012. Followed by Dr. Verl Blalock, now Dr. Angelena Form. Compliant with plavis, asa, statin. Also on bisoprolol and taking 1/4 of a pill at this point.  A/P: I refilled his plavix. He is not impressed with secondary prevention data on statins- I gave him the uptodate medical side info on secondary prevention. He declines statin for now despite extensive counseling.  He fortunately is asymptomatic. Exercising 3x a week- encouraged to continue.    Renal insufficiency S: states had creatinine elevation and went to see Dr. Justin Mend of nephrology. His hctz was stopped and #s improved overall. No recent labs available. Compliant with BP meds but not statin A/P: we will obtain records from Dr. Justin Mend. Suspicious this is likely CKD stage III based on prior labs. This is yet another reason he needs a statin. BP at goal  Mixed hyperlipidemia S:Off statin- does not believe in benefit.  A/P:Handout given. Advised to take- patient states he will continue to remain off for now, but is willing to discuss with cardiology at next visit. I am hopeful they have better luck at convincing him than I did of statin benefit for secondary prevention given already has a stent.    HTN (hypertension) S: controlled on Lisinopril 10mg , bisoporolol 5mg  tablet (takes 1/4), amlodipine 5mg  A/P: bradycardic to 50 on 1.25 mg bisoprolol.  Would strongly consider stopping. Cardiology started medication though and patient wants to check with them to make sure ok to stop.    Gout S:allopurinol 300mg , colchicine prn in past, has not had flare in years A/P: obtain records to see if recent uric acid, if not check in follow up. Fortunately controlled- refilled allopurinol   6 months. See avs records request.   Needs 1st colonscopy Orders Placed This Encounter  Procedures  . Ambulatory referral to Gastroenterology    Referral Priority:  Routine    Referral Type:  Consultation    Referral Reason:  Specialty Services Required    Number of Visits Requested:  1    Meds ordered this encounter  Medications  . allopurinol (ZYLOPRIM) 300 MG tablet    Sig: Take 1 tablet (  300 mg total) by mouth daily.    Dispense:  90 tablet    Refill:  3  . bisoprolol (ZEBETA) 5 MG tablet    Sig: Take 1/4 tablet daily    Dispense:  30 tablet    Refill:  3  . clopidogrel (PLAVIX) 75 MG tablet    Sig: Take 1 tablet (75 mg total) by mouth daily.    Dispense:  90 tablet    Refill:  3  . amLODipine (NORVASC) 5 MG tablet    Sig: Take 1 tablet (5 mg total) by mouth daily.    Dispense:  90 tablet    Refill:  3

## 2014-08-11 NOTE — Assessment & Plan Note (Signed)
S:allopurinol 300mg , colchicine prn in past, has not had flare in years A/P: obtain records to see if recent uric acid, if not check in follow up. Fortunately controlled- refilled allopurinol

## 2014-08-11 NOTE — Assessment & Plan Note (Signed)
S: states had creatinine elevation and went to see Dr. Justin Mend of nephrology. His hctz was stopped and #s improved overall. No recent labs available. Compliant with BP meds but not statin A/P: we will obtain records from Dr. Justin Mend. Suspicious this is likely CKD stage III based on prior labs. This is yet another reason he needs a statin. BP at goal

## 2014-08-11 NOTE — Assessment & Plan Note (Signed)
S:Off statin- does not believe in benefit.  A/P:Handout given. Advised to take- patient states he will continue to remain off for now, but is willing to discuss with cardiology at next visit. I am hopeful they have better luck at convincing him than I did of statin benefit for secondary prevention given already has a stent.

## 2014-08-12 ENCOUNTER — Encounter: Payer: Self-pay | Admitting: Internal Medicine

## 2014-09-02 ENCOUNTER — Telehealth: Payer: Self-pay | Admitting: Family Medicine

## 2014-09-02 MED ORDER — LISINOPRIL 10 MG PO TABS: 10.0000 mg | ORAL_TABLET | Freq: Every day | ORAL | Status: DC

## 2014-09-02 NOTE — Telephone Encounter (Signed)
Pt call to say that he takes LISINOPRIL and this was not called in to the pharmacy when he was here to see Dr Yong Channel last month   Daniel Solomon

## 2014-09-02 NOTE — Telephone Encounter (Signed)
Medication called in 

## 2014-09-03 ENCOUNTER — Encounter: Payer: Self-pay | Admitting: Family Medicine

## 2014-10-07 ENCOUNTER — Ambulatory Visit (INDEPENDENT_AMBULATORY_CARE_PROVIDER_SITE_OTHER): Payer: 59 | Admitting: Nurse Practitioner

## 2014-10-07 ENCOUNTER — Encounter: Payer: Self-pay | Admitting: Nurse Practitioner

## 2014-10-07 VITALS — BP 160/100 | HR 46 | Ht 68.0 in | Wt 222.4 lb

## 2014-10-07 DIAGNOSIS — Q231 Congenital insufficiency of aortic valve: Secondary | ICD-10-CM

## 2014-10-07 DIAGNOSIS — I10 Essential (primary) hypertension: Secondary | ICD-10-CM

## 2014-10-07 DIAGNOSIS — I2510 Atherosclerotic heart disease of native coronary artery without angina pectoris: Secondary | ICD-10-CM

## 2014-10-07 NOTE — Progress Notes (Signed)
CARDIOLOGY OFFICE NOTE  Date:  10/07/2014    Daniel Solomon Date of Birth: 01/17/1961 Medical Record [id number]  PCP:  Garret Reddish, MD  Cardiologist:  [facility]    Chief Complaint  Patient presents with  . Coronary Artery Disease    1 year check - seen for Dr. Angelena Form    History of Present Illness: Daniel Solomon is a 54 y.o. male who presents today for a one year check. Seen for Dr. Angelena Form. He has a history of CAD, HTN, & HLD. Other issues as noted below.  He has been followed in the past by Dr. Verl Blalock.   He was admitted with unstable angina in September 2012 and was found to have severe stenosis in the mid to distal LAD treated with a 2.25 x 18 mm Resolute DES. He was also found to have moderate mid LAD and distal RCA disease.   Last seen in July of 2015 - murmur noted - echo was obtained. Has bicuspid aortic valve with mild AS. Also with dilated aorta (41 mm)  Comes in today. Here alone. Says he is doing well. No chest pain. Not short of breath. BP running high - he has attributed to having some dental work. Takes all of his medicines at night. Does not check BP at home. He is on just 1/4 of a tablet of Zebeta. Very hard for him to cut. He has stopped his statin and fenofibrate -asking "is it really necessary". Due for follow up labs today but he is not fasting.    Past Medical History  Diagnosis Date  . Hypertension   . Hypercholesterolemia     With hypertriglyceridemia  . Glaucoma   . Kidney stones     hx hematuria as result. cystosopy normal  . Gout     allopurinol 300mg , colchicine prn in past, has not had flares  . CAD (coronary artery disease)     proximal LAD 50%, mid LAD 50-60%, mid to distal LAD 99%; mid circumflex 20%; proximal RCA 20%, distal RCA 50%; PDA 60-70%, proximal posterior AV groove 50%, proximal posterior lateral 50%.  There was a question of possible LVOT gradient;  s/p Resolute DES to mid-distal LAD 10/2010;  echocardiogram 11/17/10: EF 55-60%, mild  LVH, grade 1 diastolic dysfunction, normal aortic valve, mild MR, PASP 32.   . Renal insufficiency   . Chicken pox     Past Surgical History  Procedure Laterality Date  . Coronary stent placement  2012  . Cystoscopy/retrograde/ureteroscopy  03/26/2012    Procedure: CYSTOSCOPY/RETROGRADE/URETEROSCOPY;  Surgeon: [name], MD;  Location: [facility];  Service: Urology;  Laterality: Bilateral;  CYSTOSCOPY BILATERAL RETROGRADE PYELOGRAM AND POSSIBLE URETEROSCOPY     Medications: Current Outpatient Prescriptions  Medication Sig Dispense Refill  . allopurinol (ZYLOPRIM) 300 MG tablet Take 1 tablet (300 mg total) by mouth daily. 90 tablet 3  . amLODipine (NORVASC) 5 MG tablet Take 1 tablet (5 mg total) by mouth daily. 90 tablet 3  . aspirin 81 MG tablet Take 81 mg by mouth daily.      . clopidogrel (PLAVIX) 75 MG tablet Take 1 tablet (75 mg total) by mouth daily. 90 tablet 3  . HYDROcodone-acetaminophen (NORCO) 7.5-325 MG per tablet Take 1 tablet by mouth every 6 (six) hours as needed for severe pain (for tooth extraction).   0  . lisinopril (PRINIVIL,ZESTRIL) 10 MG tablet Take 1 tablet (10 mg total) by mouth daily. 30 tablet 6  . Tafluprost (ZIOPTAN) 0.0015 %  SOLN Apply to eye. One drop in both eyes at night    . TIMOLOL MALEATE OP Apply 6.8 mg/mL to eye every morning.     No current facility-administered medications for this visit.    Allergies: No Known Allergies  Social History: The patient  reports that he has never smoked. He has never used smokeless tobacco. He reports that he does not drink alcohol or use illicit drugs.   Family History: The patient's family history includes CAD in his father; Diabetes in his paternal grandmother; Hypertension in his father; Kidney disease in his father; Valvular heart disease in his mother.   Review of Systems: Please see the history of present illness.   Otherwise, the review of systems is positive for none.   All other  systems are reviewed and negative.   Physical Exam: VS:  BP 160/100 mmHg  Pulse 46  Ht 5\' 8"  (1.727 m)  Wt 222 lb 6.4 oz (100.88 kg)  BMI 33.82 kg/m2 .  BMI Body mass index is 33.82 kg/(m^2).  Wt Readings from Last 3 Encounters:  10/07/14 222 lb 6.4 oz (100.88 kg)  08/11/14 220 lb (99.791 kg)  09/06/13 207 lb (93.895 kg)    General: Pleasant. Well developed, well nourished and in no acute distress. He has gained 15 pounds of weight. Short and stocky. HEENT: Normal. Neck: Supple, no JVD, carotid bruits, or masses noted.  Cardiac: Regular rate and rhythm. Outflow murmur noted.  No edema.  Respiratory:  Lungs are clear to auscultation bilaterally with normal work of breathing.  GI: Soft and nontender.  MS: No deformity or atrophy. Gait and ROM intact. Skin: Warm and dry. Color is normal.  Neuro:  Strength and sensation are intact and no gross focal deficits noted.  Psych: Alert, appropriate and with normal affect.   LABORATORY DATA:  EKG:  EKG is ordered today. This demonstrates marked sinus bradycardia - rate of 46.  Lab Results  Component Value Date   WBC 5.3 03/17/2014   HGB 17.0 03/17/2014   HCT 51 03/17/2014   PLT 156 03/17/2014   GLUCOSE 98 03/26/2012   CHOL 246* 03/17/2014   TRIG 147 03/17/2014   HDL 45 03/17/2014   LDLDIRECT 70.9 02/10/2012   LDLCALC 167 03/17/2014   ALT 24 02/10/2012   AST 18 02/10/2012   NA 141 03/17/2014   K 4.8 03/17/2014   CL 110 12/06/2010   CREATININE 1.6* 03/17/2014   BUN 21 03/17/2014   CO2 26 12/06/2010   TSH 2.277 11/16/2010   INR 0.93 11/16/2010   HGBA1C 5.7* 11/16/2010    BNP (last 3 results) No results for input(s): BNP in the last 8760 hours.  ProBNP (last 3 results) No results for input(s): PROBNP in the last 8760 hours.   Other Studies Reviewed Today:  Echo Study Conclusions from 09/2013  - Left ventricle: The cavity size was normal. Wall thickness was increased in a pattern of mild LVH. Systolic function was  normal. The estimated ejection fraction was in the range of 55% to 60%. Wall motion was normal; there were no regional wall motion abnormalities. Left ventricular diastolic function parameters were normal. - Aortic valve: Bicuspid; mildly calcified leaflets. There was mild stenosis. There was trivial regurgitation. Mean gradient (S): 12 mm Hg. Valve area (VTI): 1.7 cm^2. - Aorta: Ascending aorta was mildly dilated. Aortic root dimension: 31 mm (ED). Ascending aortic diameter: 41 mm (S). - Mitral valve: There was trivial regurgitation. - Left atrium: The atrium was mildly dilated. -  Right ventricle: The cavity size was normal. Systolic function was normal. - Tricuspid valve: Peak RV-RA gradient (S): 30 mm Hg. - Pulmonary arteries: PA peak pressure: 33 mm Hg (S). - Inferior vena cava: The vessel was normal in size. The respirophasic diameter changes were in the normal range (>= 50%), consistent with normal central venous pressure.  Impressions:  - Normal LV size with mild LV hypertrophy. EF 55-60%. Normal diastolic function. Normal RV size and systolic function. Bicuspid aortic valve with mild AS and trivial AI. Mildly dilated ascending aorta (41 mm).   Cardiac cath 11/17/10: 1. Left main coronary artery: The vessel is normal in size and free  of significant disease.  2. Left anterior descending artery: The vessel is normal in size with  disease noted throughout its course. Proximally, just before the  first septal branch, there is 50% stenosis. In the mid segment  right after second diagonal, there is another 50%-60% eccentric  lesion. In the mid-to-distal LAD before the third diagonal, there  is a 99% tubular stenosis. First diagonal is overall small in size  and free of significant disease. Second diagonal is normal in size  and free of significant disease. Third diagonal is very small in  size.  3. Left circumflex artery: The vessel is  normal in size but  nondominant. It has minor irregularities without obstructive  disease. There is a 20% lesion in the mid segment right after OM- 1.  4. Right coronary artery: The vessel is very large in size and  dominant. There is a 20% proximal stenosis. There is minor  irregularities in the mid segment. Distally, right at PDA and the  AV groove bifurcation, there is a 50% stenosis. The PDA itself is  medium in size and diffusely diseased throughout its course with  60%-70% disease noted with plaque formation. The distal area is  too small. The posterior AV groove has a 50% proximal lesion and  it gives a large posterolateral branch which has a 50% proximal  stenosis. The second posterolateral branch is small in size and  free of significant disease.    Assessment and Plan:   1. CAD: Stable. He is on good medical therapy. Continue ASA, Plavix, beta blocker, statin.   2. HTN: BP not controlled. I have increased his Lisinopril to 20 mg a day. Nurse visit for BP check in 4 weeks  3. HLD: Needs labs but not fasting. He has stopped his lipid lowering therapy - not really clear why. He wants to see what his labs look like on no therapy and then decide. I would favor him restarting his statin therapy.   4. Bicuspid aortic valve with mild AS noted by echo from 2015 - no cardinal symptoms. Will get updated.   5. Dilated ascending aorta - get echo updated. Explained how we need good BP control. If no significant change on echo then consider checking about every 2 years.   Current medicines are reviewed with the patient today.  The patient does not have concerns regarding medicines other than what has been noted above.  The following changes have been made:  See above.  Labs/ tests ordered today include:    Orders Placed This Encounter  Procedures  . Basic metabolic panel  . Hepatic function panel  . Lipid panel  . EKG 12-Lead  . ECHOCARDIOGRAM COMPLETE      Disposition:   FU with Dr. Angelena Form in one year.    Patient is agreeable to this plan and  will call if any problems develop in the interim.   Signed: [name], RN, ANP-C 10/07/2014 11:31 AM  [facility] [address] [place], [place]  [address] Phone: [phone] Fax: [phone]

## 2014-10-07 NOTE — Patient Instructions (Addendum)
We will be checking the following labs today - NONE  Fasting labs on day of echo   Medication Instructions:    Continue with your current medicines but  I am stopping the Zebeta  I am increasing the Lisinopril to 20 mg a day - you may take 2 of your 10 mg tablets to use up - the 20 mg RX is at the drug store.     Testing/Procedures To Be Arranged:  Echocardiogram  Follow-Up:   See Dr. Angelena Form in one year.  Nurse visit for BP check in one month    Other Special Instructions:   Try to monitor your blood pressure at home  Exercise daily - goal is 45 to 60 minutes a day  Get back on track with losing weight  Call the [place] office at [phone] if you have any questions, problems or concerns.

## 2014-10-14 ENCOUNTER — Ambulatory Visit (HOSPITAL_COMMUNITY): Payer: 59 | Attending: Cardiovascular Disease

## 2014-10-14 ENCOUNTER — Other Ambulatory Visit (INDEPENDENT_AMBULATORY_CARE_PROVIDER_SITE_OTHER): Payer: 59 | Admitting: *Deleted

## 2014-10-14 ENCOUNTER — Other Ambulatory Visit: Payer: Self-pay

## 2014-10-14 DIAGNOSIS — I10 Essential (primary) hypertension: Secondary | ICD-10-CM | POA: Insufficient documentation

## 2014-10-14 DIAGNOSIS — I350 Nonrheumatic aortic (valve) stenosis: Secondary | ICD-10-CM | POA: Insufficient documentation

## 2014-10-14 DIAGNOSIS — E785 Hyperlipidemia, unspecified: Secondary | ICD-10-CM | POA: Diagnosis not present

## 2014-10-14 DIAGNOSIS — Q231 Congenital insufficiency of aortic valve: Secondary | ICD-10-CM

## 2014-10-14 DIAGNOSIS — I2510 Atherosclerotic heart disease of native coronary artery without angina pectoris: Secondary | ICD-10-CM | POA: Diagnosis present

## 2014-10-14 DIAGNOSIS — I517 Cardiomegaly: Secondary | ICD-10-CM | POA: Insufficient documentation

## 2014-10-14 DIAGNOSIS — I340 Nonrheumatic mitral (valve) insufficiency: Secondary | ICD-10-CM | POA: Insufficient documentation

## 2014-10-14 DIAGNOSIS — I77810 Thoracic aortic ectasia: Secondary | ICD-10-CM | POA: Insufficient documentation

## 2014-10-14 DIAGNOSIS — Z8249 Family history of ischemic heart disease and other diseases of the circulatory system: Secondary | ICD-10-CM | POA: Insufficient documentation

## 2014-10-14 LAB — LIPID PANEL
Cholesterol: 216 mg/dL — ABNORMAL HIGH (ref 0–200)
HDL: 34.7 mg/dL — ABNORMAL LOW (ref >39.00)
LDL Cholesterol: 148 mg/dL — ABNORMAL HIGH (ref 0–99)
NonHDL: 181.27
Total CHOL/HDL Ratio: 6
Triglycerides: 167 mg/dL — ABNORMAL HIGH (ref 0.0–149.0)
VLDL: 33.4 mg/dL (ref 0.0–40.0)

## 2014-10-14 LAB — HEPATIC FUNCTION PANEL
ALT: 28 U/L (ref 0–53)
AST: 20 U/L (ref 0–37)
Albumin: 3.8 g/dL (ref 3.5–5.2)
Alkaline Phosphatase: 71 U/L (ref 39–117)
Bilirubin, Direct: 0.1 mg/dL (ref 0.0–0.3)
Total Bilirubin: 0.6 mg/dL (ref 0.2–1.2)
Total Protein: 6.6 g/dL (ref 6.0–8.3)

## 2014-10-14 LAB — BASIC METABOLIC PANEL
BUN: 23 mg/dL (ref 6–23)
CO2: 30 mEq/L (ref 19–32)
Calcium: 8.7 mg/dL (ref 8.4–10.5)
Chloride: 106 mEq/L (ref 96–112)
Creatinine, Ser: 1.47 mg/dL (ref 0.40–1.50)
GFR: 53.13 mL/min — ABNORMAL LOW (ref >60.00)
Glucose, Bld: 99 mg/dL (ref 70–99)
Potassium: 3.9 mEq/L (ref 3.5–5.1)
Sodium: 140 mEq/L (ref 135–145)

## 2014-10-15 ENCOUNTER — Telehealth: Payer: Self-pay | Admitting: *Deleted

## 2014-10-15 ENCOUNTER — Encounter: Payer: Self-pay | Admitting: Internal Medicine

## 2014-10-15 ENCOUNTER — Ambulatory Visit (INDEPENDENT_AMBULATORY_CARE_PROVIDER_SITE_OTHER): Payer: 59 | Admitting: Internal Medicine

## 2014-10-15 VITALS — BP 130/82 | HR 60 | Ht 68.75 in | Wt 220.1 lb

## 2014-10-15 DIAGNOSIS — Z7902 Long term (current) use of antithrombotics/antiplatelets: Secondary | ICD-10-CM

## 2014-10-15 DIAGNOSIS — Z1211 Encounter for screening for malignant neoplasm of colon: Secondary | ICD-10-CM | POA: Diagnosis not present

## 2014-10-15 MED ORDER — NA SULFATE-K SULFATE-MG SULF 17.5-3.13-1.6 GM/177ML PO SOLN: ORAL | Status: DC

## 2014-10-15 NOTE — Telephone Encounter (Signed)
No recent coronary stent placements. OK to hold Plavix for 5-7 days before his planned colonoscopy.   Darlina Guys, MD

## 2014-10-15 NOTE — Telephone Encounter (Signed)
10/15/2014  RE: Ripken Monterrosa DOB: [birth year] MRN: [id number]  Dear Dr Angelena Form,   We have scheduled the above patient for a colonoscopy procedure. Our records show that he is on anticoagulation therapy.  Please advise as to whether the patient may come off his therapy of Plavix 5 days prior to the procedure, which is scheduled for 12/09/14. Please route your response to Dixon Boos, CMA.  Sincerely,  Dixon Boos

## 2014-10-15 NOTE — Telephone Encounter (Signed)
-----   Message from [name], NP sent at 10/15/2014  7:57 AM EDT ----- I did not send this echo result to you to be called -   His echo shows normal pumping function.  Some mild blockage on the aortic valve with mild aortic stenosis  Some mild dilatation of his aorta.  He will need follow up studies going forward - nothing to do differently now - just good BP control.  Echo in one year.   Cecille Rubin

## 2014-10-15 NOTE — Telephone Encounter (Signed)
I have spoken to patient to advise that per Dr Angelena Form, he may hold his Plavix 5 days prior to his procedure. Patient verbalizes understanding.

## 2014-10-15 NOTE — Patient Instructions (Signed)
You have been scheduled for a colonoscopy. Please follow written instructions given to you at your visit today.  Please pick up your prep supplies at the pharmacy within the next 1-3 days. If you use inhalers (even only as needed), please bring them with you on the day of your procedure. Your physician has requested that you go to www.startemmi.com and enter the access code given to you at your visit today. This web site gives a general overview about your procedure. However, you should still follow specific instructions given to you by our office regarding your preparation for the procedure.  You will be contacted by our office prior to your procedure for directions on holding your Plavix.  If you do not hear from our office 1 week prior to your scheduled procedure, please call [phone] to discuss.  

## 2014-10-15 NOTE — Telephone Encounter (Signed)
Pt aware of echo results, recall in system for echo in one year

## 2014-10-15 NOTE — Progress Notes (Signed)
Patient ID: Daniel Solomon, male   DOB: 1960/06/20, 54 y.o.   MRN: [id number] HPI: Daniel Solomon is a 54 yo male with PMH of CAD on plavix, HTN, HL, gout who seen in consultation at the request of Dr. Yong Channel to discuss screening colonoscopy. He has never had colonoscopy for screening before. He denies specific GI complaint today including no abdominal pain. No change in bowel habit. No diarrhea or constipation. No rectal bleeding or melena. Good appetite. No nausea or vomiting. Heartburn. No trouble swallowing. No family history of colorectal cancer. He does take Plavix 75 mg daily for the last 4 years. He had PCI performed by Dr. Verl Blalock for angina. No subsequent angina. He denies dyspnea. He works in [employer], [employer].  Past Medical History  Diagnosis Date  . Hypertension   . Hypercholesterolemia     With hypertriglyceridemia  . Glaucoma   . Kidney stones     hx hematuria as result. cystosopy normal  . Gout     allopurinol 300mg , colchicine prn in past, has not had flares  . CAD (coronary artery disease)     proximal LAD 50%, mid LAD 50-60%, mid to distal LAD 99%; mid circumflex 20%; proximal RCA 20%, distal RCA 50%; PDA 60-70%, proximal posterior AV groove 50%, proximal posterior lateral 50%.  There was a question of possible LVOT gradient;  s/p Resolute DES to mid-distal LAD 10/2010;  echocardiogram 11/17/10: EF 55-60%, mild LVH, grade 1 diastolic dysfunction, normal aortic valve, mild MR, PASP 32.   . Renal insufficiency   . Chicken pox     Past Surgical History  Procedure Laterality Date  . Coronary stent placement  2012  . Cystoscopy/retrograde/ureteroscopy  03/26/2012    Procedure: CYSTOSCOPY/RETROGRADE/URETEROSCOPY;  Surgeon: [name], MD;  [facility];  Service: Urology;  Laterality: Bilateral;  CYSTOSCOPY BILATERAL RETROGRADE PYELOGRAM AND POSSIBLE URETEROSCOPY    Outpatient Prescriptions Prior to Visit  Medication Sig Dispense Refill  . allopurinol  (ZYLOPRIM) 300 MG tablet Take 1 tablet (300 mg total) by mouth daily. 90 tablet 3  . amLODipine (NORVASC) 5 MG tablet Take 1 tablet (5 mg total) by mouth daily. 90 tablet 3  . aspirin 81 MG tablet Take 81 mg by mouth daily.      . clopidogrel (PLAVIX) 75 MG tablet Take 1 tablet (75 mg total) by mouth daily. 90 tablet 3  . HYDROcodone-acetaminophen (NORCO) 7.5-325 MG per tablet Take 1 tablet by mouth every 6 (six) hours as needed for severe pain (for tooth extraction).   0  . lisinopril (PRINIVIL,ZESTRIL) 10 MG tablet Take 1 tablet (10 mg total) by mouth daily. 30 tablet 6  . Tafluprost (ZIOPTAN) 0.0015 % SOLN Apply to eye. One drop in both eyes at night    . TIMOLOL MALEATE OP Apply 6.8 mg/mL to eye every morning.     No facility-administered medications prior to visit.    No Known Allergies  Family History  Problem Relation Age of Onset  . Hypertension Father   . CAD Father     CABG 5s  . Valvular heart disease Mother     AVR July 2015  . Kidney disease Father     Stage IV  . Diabetes Paternal Grandmother     Social History  Substance Use Topics  . Smoking status: Never Smoker   . Smokeless tobacco: Never Used  . Alcohol Use: No    ROS: As per history of present illness, otherwise negative  BP 130/82 mmHg  Pulse 60  Ht 5' 8.75" (1.746 m)  Wt 220 lb 2 oz (99.848 kg)  BMI 32.75 kg/m2 Constitutional: Well-developed and well-nourished. No distress. HEENT: Normocephalic and atraumatic. Oropharynx is clear and moist. No oropharyngeal exudate. Conjunctivae are normal.  No scleral icterus. Neck: Neck supple. Trachea midline. Cardiovascular: Normal rate, regular rhythm and intact distal pulses. No M/R/G Pulmonary/chest: Effort normal and breath sounds normal. No wheezing, rales or rhonchi. Abdominal: Soft, nontender, nondistended. Bowel sounds active throughout. There are no masses palpable. No hepatosplenomegaly. Extremities: no clubbing, cyanosis, or edema Lymphadenopathy:  No cervical adenopathy noted. Neurological: Alert and oriented to person place and time. Skin: Skin is warm and dry. No rashes noted. Psychiatric: Normal mood and affect. Behavior is normal.  RELEVANT LABS AND IMAGING: CBC    Component Value Date/Time   WBC 5.3 03/17/2014   WBC 8.3 11/18/2010 0505   RBC 5.08 11/18/2010 0505   HGB 17.0 03/17/2014   HCT 51 03/17/2014   PLT 156 03/17/2014   MCV 91.1 11/18/2010 0505   MCH 31.9 11/18/2010 0505   MCHC 35.0 11/18/2010 0505   RDW 12.8 11/18/2010 0505    CMP     Component Value Date/Time   NA 140 10/14/2014 0823   NA 141 03/17/2014   K 3.9 10/14/2014 0823   CL 106 10/14/2014 0823   CO2 30 10/14/2014 0823   GLUCOSE 99 10/14/2014 0823   BUN 23 10/14/2014 0823   BUN 21 03/17/2014   CREATININE 1.47 10/14/2014 0823   CREATININE 1.6* 03/17/2014   CALCIUM 8.7 10/14/2014 0823   PROT 6.6 10/14/2014 0823   ALBUMIN 3.8 10/14/2014 0823   AST 20 10/14/2014 0823   ALT 28 10/14/2014 0823   ALKPHOS 71 10/14/2014 0823   BILITOT 0.6 10/14/2014 0823   GFRNONAA >60 11/18/2010 0505   GFRAA >60 11/18/2010 0505    ASSESSMENT/PLAN: 54 yo male with PMH of CAD on plavix, HTN, HL, gout who seen in consultation at the request of Dr. Yong Channel to discuss screening colonoscopy.   1. CRC screening/chronic anti-plt use -- patient is appropriate for average risk or rectal cancer screening based on age. We discussed the test including the risk benefits and alternatives and he is agreeable to proceed.  Hold Plavix 5 days before procedure - will instruct when and how to resume after procedure. Risks and benefits of procedure including bleeding, perforation, infection, missed lesions, medication reactions and possible hospitalization or surgery if complications occur explained. Additional rare but real risk of cardiovascular event such as heart attack or ischemia/infarct of other organs off Plavix explained and need to seek urgent help if this occurs. Will  communicate by phone or EMR with patient's prescribing provider that to confirm holding Plavix is reasonable in this case.      [id number], [place], [place] [address]

## 2014-10-24 ENCOUNTER — Telehealth: Payer: Self-pay | Admitting: *Deleted

## 2014-10-24 NOTE — Telephone Encounter (Signed)
S/w pt aware of echo results.  Recall in system for 1 year f/u on echo

## 2014-11-13 ENCOUNTER — Ambulatory Visit (INDEPENDENT_AMBULATORY_CARE_PROVIDER_SITE_OTHER): Payer: 59 | Admitting: Pharmacist

## 2014-11-13 VITALS — BP 138/84 | HR 60

## 2014-11-13 DIAGNOSIS — E782 Mixed hyperlipidemia: Secondary | ICD-10-CM

## 2014-11-13 DIAGNOSIS — I10 Essential (primary) hypertension: Secondary | ICD-10-CM | POA: Diagnosis not present

## 2014-11-13 MED ORDER — ATORVASTATIN CALCIUM 40 MG PO TABS: 40.0000 mg | ORAL_TABLET | Freq: Every day | ORAL | Status: DC

## 2014-11-13 MED ORDER — LISINOPRIL 20 MG PO TABS: 20.0000 mg | ORAL_TABLET | Freq: Every day | ORAL | Status: DC

## 2014-11-13 NOTE — Patient Instructions (Signed)
A new prescription has been sent over for your lisinopril 20mg  tablets to pick up when needed. Continue taking this and your amlodipine for your blood pressure Please start taking Lipitor (atorvastatin) 40mg  once daily for your cholesterol. You do not need to restart your fenofibrate We will follow up with a fasting lipid panel in 3 months (Tuesday, December 13). Lab opens at 7:30, come in any time after

## 2014-11-13 NOTE — Progress Notes (Signed)
Patient ID: Daniel Solomon                DOB: 06/21/2060, 54 yo                       MRN: [id number]     HPI: Daniel Solomon is a 54 y.o. male patient of Dr. Angelena Form referred to lipid clinic by Dr. Servando Snare for BP follow up. PMH is significant for CAD s/p PCI in 2012, HTN, HLD, and gout. He was admitted with unstable angina in September 2012 and was found to have severe stenosis in the mid to distal LAD treated with a 2.25 x 18 mm Resolute DES. He was also found to have moderate mid LAD and distal RCA disease. He saw Dr. Servando Snare a month ago and his lisinopril was increased from 10mg  to 20mg  daily d/t elevated BP of 160/100 at that time. His bisoprolol was also discontinued at that visit (pulse 46).   He reports no issues with the higher dose of lisinopril and denies dizziness. He checks his BP at home and has readings on his phone today. 1 week average = 134/84. Patient also stopped taking his atorvastatin and gemfibrozil because he did not think they were benefiting him at all. He states that he has been off lipid-lowering therapy for about 6 months.  Current BP medications: amlodipine 5mg  daily, lisinopril 20mg  daily Current lipid medications: none. Previously took atorvastatin 40mg  and gemfibrozil Cardiac risk factors: CAD s/p PCI, sex, family history LDL goal: 70mg /dL BP goal: < 140/37mmHg  Diet: protein shake for breakfast. Lunch/dinner vary - black bean soup, chicken tortillas, sweet potatoes and salad.  Exercise: Walks 30 minutes a day  Family History: Father with HTN and CAD s/p CABG, mother with valvular heart disease  Social History: No alcohol, tobacco, or drug use.  Labs: 09/2014: TC 216, TG 167, HDL 34.7, LDL 148 (on no therapy) 01/2012: TC 126, TG 250, HDL 32.3, LDL 70 (atorvastatin 40)   Past Medical History  Diagnosis Date  . Hypertension   . Hypercholesterolemia     With hypertriglyceridemia  . Glaucoma   . Kidney stones     hx hematuria as result. cystosopy normal    . Gout     allopurinol 300mg , colchicine prn in past, has not had flares  . CAD (coronary artery disease)     proximal LAD 50%, mid LAD 50-60%, mid to distal LAD 99%; mid circumflex 20%; proximal RCA 20%, distal RCA 50%; PDA 60-70%, proximal posterior AV groove 50%, proximal posterior lateral 50%.  There was a question of possible LVOT gradient;  s/p Resolute DES to mid-distal LAD 10/2010;  echocardiogram 11/17/10: EF 55-60%, mild LVH, grade 1 diastolic dysfunction, normal aortic valve, mild MR, PASP 32.   . Renal insufficiency   . Chicken pox     Current Outpatient Prescriptions on File Prior to Visit  Medication Sig Dispense Refill  . allopurinol (ZYLOPRIM) 300 MG tablet Take 1 tablet (300 mg total) by mouth daily. 90 tablet 3  . amLODipine (NORVASC) 5 MG tablet Take 1 tablet (5 mg total) by mouth daily. 90 tablet 3  . aspirin 81 MG tablet Take 81 mg by mouth daily.      . clopidogrel (PLAVIX) 75 MG tablet Take 1 tablet (75 mg total) by mouth daily. 90 tablet 3  . HYDROcodone-acetaminophen (NORCO) 7.5-325 MG per tablet Take 1 tablet by mouth every 6 (six) hours as needed for severe pain (for tooth extraction).  0  . Na Sulfate-K Sulfate-Mg Sulf SOLN Use as directed 354 mL 0  . Tafluprost (ZIOPTAN) 0.0015 % SOLN Apply to eye. One drop in both eyes at night    . TIMOLOL MALEATE OP Apply 6.8 mg/mL to eye every morning.     No current facility-administered medications on file prior to visit.    Wt Readings from Last 3 Encounters:  10/15/14 220 lb 2 oz (99.848 kg)  10/07/14 222 lb 6.4 oz (100.88 kg)  08/11/14 220 lb (99.791 kg)   BP Readings from Last 3 Encounters:  11/13/14 138/84  10/15/14 130/82  10/07/14 160/100   Pulse Readings from Last 3 Encounters:  11/13/14 60  10/15/14 60  10/07/14 46    No known allergies  Assessment/Plan:  1. Hypertension - Patient's BP currently at goal <140/31mmHg on lisinopril 20mg  and amlodipine 5mg . Sent over new rx for lisinopril per patient  request. Discussed lifestyle modifications with patient. No change in therapy at this time. If BP starts trending up in the future, would max out amlodipine before adding a new agent.  2. Hyperlipidemia - Patient has history of CAD s/p PCI in 2012. He came off statin therapy 6 months ago because he doesn't think that statins help him. Printed out trial data showing mortality benefit with statin therapy as well as reduction in MI and stroke. Patient has an LDL goal <70, he was at goal when he took his atorvastatin. Since discontinuing, his LDL has doubled as expected. Had a lengthy discussion with patient about the benefits of statin therapy and his increased risk for future events. He is agreeable to restarting atorvastatin 40mg  daily, rx sent. Did not restart TG lowering therapy (pt with hx of TG 300-600) since TG have trended down to 160 and patient is not on therapy. Will follow up with lipid panel in 3 months.   Megan E. Supple, PharmD [facility] [id number] [place], [place] [address] Phone: [phone]; Fax: [phone] 11/13/2014 9:45 AM

## 2014-12-09 ENCOUNTER — Encounter: Payer: Self-pay | Admitting: Internal Medicine

## 2014-12-09 ENCOUNTER — Ambulatory Visit (AMBULATORY_SURGERY_CENTER): Payer: 59 | Admitting: Internal Medicine

## 2014-12-09 VITALS — BP 141/78 | HR 56 | Temp 97.9°F | Resp 13 | Ht 68.0 in | Wt 220.0 lb

## 2014-12-09 DIAGNOSIS — Z1211 Encounter for screening for malignant neoplasm of colon: Secondary | ICD-10-CM | POA: Diagnosis present

## 2014-12-09 DIAGNOSIS — D120 Benign neoplasm of cecum: Secondary | ICD-10-CM

## 2014-12-09 DIAGNOSIS — K635 Polyp of colon: Secondary | ICD-10-CM

## 2014-12-09 MED ORDER — SODIUM CHLORIDE 0.9 % IV SOLN: 500.0000 mL | INTRAVENOUS | Status: DC

## 2014-12-09 NOTE — Progress Notes (Signed)
Called to room to assist during endoscopic procedure.  Patient ID and intended procedure confirmed with present staff. Received instructions for my participation in the procedure from the performing physician.  

## 2014-12-09 NOTE — Patient Instructions (Signed)
Discharge instructions given. Handout on polyps. Resume Plavix tomorrow. Resume previous medications. YOU HAD AN ENDOSCOPIC PROCEDURE TODAY AT [facility]:   Refer to the procedure report that was given to you for any specific questions about what was found during the examination.  If the procedure report does not answer your questions, please call your gastroenterologist to clarify.  If you requested that your care partner not be given the details of your procedure findings, then the procedure report has been included in a sealed envelope for you to review at your convenience later.  YOU SHOULD EXPECT: Some feelings of bloating in the abdomen. Passage of more gas than usual.  Walking can help get rid of the air that was put into your GI tract during the procedure and reduce the bloating. If you had a lower endoscopy (such as a colonoscopy or flexible sigmoidoscopy) you may notice spotting of blood in your stool or on the toilet paper. If you underwent a bowel prep for your procedure, you may not have a normal bowel movement for a few days.  Please Note:  You might notice some irritation and congestion in your nose or some drainage.  This is from the oxygen used during your procedure.  There is no need for concern and it should clear up in a day or so.  SYMPTOMS TO REPORT IMMEDIATELY:   Following lower endoscopy (colonoscopy or flexible sigmoidoscopy):  Excessive amounts of blood in the stool  Significant tenderness or worsening of abdominal pains  Swelling of the abdomen that is new, acute  Fever of 100F or higher  For urgent or emergent issues, a gastroenterologist can be reached at any hour by calling [phone].   DIET: Your first meal following the procedure should be a small meal and then it is ok to progress to your normal diet. Heavy or fried foods are harder to digest and may make you feel nauseous or bloated.  Likewise, meals heavy in dairy and vegetables can  increase bloating.  Drink plenty of fluids but you should avoid alcoholic beverages for 24 hours.  ACTIVITY:  You should plan to take it easy for the rest of today and you should NOT DRIVE or use heavy machinery until tomorrow (because of the sedation medicines used during the test).    FOLLOW UP: Our staff will call the number listed on your records the next business day following your procedure to check on you and address any questions or concerns that you may have regarding the information given to you following your procedure. If we do not reach you, we will leave a message.  However, if you are feeling well and you are not experiencing any problems, there is no need to return our call.  We will assume that you have returned to your regular daily activities without incident.  If any biopsies were taken you will be contacted by phone or by letter within the next 1-3 weeks.  Please call us at [phone] if you have not heard about the biopsies in 3 weeks.    SIGNATURES/CONFIDENTIALITY: You and/or your care partner have signed paperwork which will be entered into your electronic medical record.  These signatures attest to the fact that that the information above on your After Visit Summary has been reviewed and is understood.  Full responsibility of the confidentiality of this discharge information lies with you and/or your care-partner.

## 2014-12-09 NOTE — Progress Notes (Signed)
A/ox3 pleased with MAC, report to RN 

## 2014-12-09 NOTE — Op Note (Signed)
[facility]  [address] [place], [id number]   COLONOSCOPY PROCEDURE REPORT  PATIENT: Daniel Solomon, Daniel Solomon  MR#: [id number] BIRTHDATE: 1960/07/15 , 68  yrs. old GENDER: male ENDOSCOPIST: Jerene Bears, MD REFERRED [id number] Kristian Covey, M.D. PROCEDURE DATE:  12/09/2014 PROCEDURE:   Colonoscopy, screening and Colonoscopy with cold biopsy polypectomy First Screening Colonoscopy - Avg.  risk and is 50 yrs.  old or older Yes.  Prior Negative Screening - Now for repeat screening. N/A  History of Adenoma - Now for follow-up colonoscopy & has been > or = to 3 yrs.  N/A  Polyps removed today? Yes Polyps Removed Today ASA CLASS:   Class II INDICATIONS:Screening for colonic neoplasia and Colorectal Neoplasm Risk Assessment for this procedure is average risk. MEDICATIONS: Monitored anesthesia care and Propofol 200 mg IV  DESCRIPTION OF PROCEDURE:   After the risks benefits and alternatives of the procedure were thoroughly explained, informed consent was obtained.  The digital rectal exam revealed no rectal mass and revealed several skin tags.   The [id number] endoscope was introduced through the anus and advanced to the cecum, which was identified by both the appendix and ileocecal valve. No adverse events experienced.   The quality of the prep was good.  (Suprep was used)  The instrument was then slowly withdrawn as the colon was fully examined. Estimated blood loss is zero unless otherwise noted in this procedure report.  COLON FINDINGS: A sessile polyp measuring 2 mm in size was found at the cecum.  A polypectomy was performed with cold forceps.  The resection was complete, the polyp tissue was completely retrieved and sent to histology.   The examination was otherwise normal. Retroflexed views revealed internal hemorrhoids. The time to cecum = 2.0 Withdrawal time = 8.3   The scope was withdrawn and the procedure completed. COMPLICATIONS: There were no immediate  complications.  ENDOSCOPIC IMPRESSION: 1.   Sessile polyp was found at the cecum; polypectomy was performed with cold forceps 2.   The examination was otherwise normal  RECOMMENDATIONS: 1.  Await pathology results 2.  If the polyp removed today is proven to be an adenomatous (pre-cancerous) polyp, you will need a repeat colonoscopy in 5 years.  Otherwise you should continue to follow colorectal cancer screening guidelines for "routine risk" patients with colonoscopy in 10 years.  You will receive a letter within 1-2 weeks with the results of your biopsy as well as final recommendations.  Please call my office if you have not received a letter after 3 weeks. 3.  Resume Plavix tomorrow at usual dose  eSigned:  Jerene Bears, MD 12/09/2014 3:43 PM   cc: Marin Olp and The Patient

## 2014-12-10 ENCOUNTER — Telehealth: Payer: Self-pay | Admitting: *Deleted

## 2014-12-10 NOTE — Telephone Encounter (Signed)
Left message that we called for f/u 

## 2014-12-16 ENCOUNTER — Encounter: Payer: Self-pay | Admitting: Internal Medicine

## 2015-02-10 ENCOUNTER — Other Ambulatory Visit (INDEPENDENT_AMBULATORY_CARE_PROVIDER_SITE_OTHER): Payer: 59 | Admitting: *Deleted

## 2015-02-10 DIAGNOSIS — E782 Mixed hyperlipidemia: Secondary | ICD-10-CM

## 2015-02-10 LAB — HEPATIC FUNCTION PANEL
ALT: 29 U/L (ref 9–46)
AST: 22 U/L (ref 10–35)
Albumin: 3.5 g/dL — ABNORMAL LOW (ref 3.6–5.1)
Alkaline Phosphatase: 82 U/L (ref 40–115)
BILIRUBIN DIRECT: 0.2 mg/dL (ref <=0.2)
BILIRUBIN INDIRECT: 0.4 mg/dL (ref 0.2–1.2)
Total Bilirubin: 0.6 mg/dL (ref 0.2–1.2)
Total Protein: 6.4 g/dL (ref 6.1–8.1)

## 2015-02-10 LAB — LIPID PANEL
CHOL/HDL RATIO: 2.8 ratio (ref <=5.0)
CHOLESTEROL: 106 mg/dL — AB (ref 125–200)
HDL: 38 mg/dL — ABNORMAL LOW (ref >=40)
LDL Cholesterol: 45 mg/dL (ref <130)
Triglycerides: 115 mg/dL (ref <150)
VLDL: 23 mg/dL (ref <30)

## 2015-03-19 ENCOUNTER — Other Ambulatory Visit (INDEPENDENT_AMBULATORY_CARE_PROVIDER_SITE_OTHER): Payer: BLUE CROSS/BLUE SHIELD

## 2015-03-19 DIAGNOSIS — Z0000 Encounter for general adult medical examination without abnormal findings: Secondary | ICD-10-CM

## 2015-03-19 LAB — CBC WITH DIFFERENTIAL/PLATELET
BASOS ABS: 0 10*3/uL (ref 0.0–0.1)
Basophils Relative: 0.4 % (ref 0.0–3.0)
Eosinophils Absolute: 0.2 10*3/uL (ref 0.0–0.7)
Eosinophils Relative: 3.2 % (ref 0.0–5.0)
HCT: 47.4 % (ref 39.0–52.0)
HEMOGLOBIN: 15.4 g/dL (ref 13.0–17.0)
LYMPHS ABS: 1.6 10*3/uL (ref 0.7–4.0)
Lymphocytes Relative: 29.6 % (ref 12.0–46.0)
MCHC: 32.4 g/dL (ref 30.0–36.0)
MCV: 97.6 fl (ref 78.0–100.0)
MONOS PCT: 8.6 % (ref 3.0–12.0)
Monocytes Absolute: 0.5 10*3/uL (ref 0.1–1.0)
NEUTROS PCT: 58.2 % (ref 43.0–77.0)
Neutro Abs: 3.1 10*3/uL (ref 1.4–7.7)
Platelets: 145 10*3/uL — ABNORMAL LOW (ref 150.0–400.0)
RBC: 4.86 Mil/uL (ref 4.22–5.81)
RDW: 13.7 % (ref 11.5–15.5)
WBC: 5.4 10*3/uL (ref 4.0–10.5)

## 2015-03-19 LAB — HEPATIC FUNCTION PANEL
ALBUMIN: 3.8 g/dL (ref 3.5–5.2)
ALK PHOS: 93 U/L (ref 39–117)
ALT: 44 U/L (ref 0–53)
AST: 23 U/L (ref 0–37)
BILIRUBIN TOTAL: 0.9 mg/dL (ref 0.2–1.2)
Bilirubin, Direct: 0.2 mg/dL (ref 0.0–0.3)
Total Protein: 6.4 g/dL (ref 6.0–8.3)

## 2015-03-19 LAB — POCT URINALYSIS DIPSTICK
BILIRUBIN UA: NEGATIVE
Glucose, UA: NEGATIVE
KETONES UA: NEGATIVE
Leukocytes, UA: NEGATIVE
Nitrite, UA: NEGATIVE
PH UA: 6.5
RBC UA: NEGATIVE
Spec Grav, UA: 1.02
Urobilinogen, UA: 0.2

## 2015-03-19 LAB — LIPID PANEL
Cholesterol: 126 mg/dL (ref 0–200)
HDL: 34.6 mg/dL — ABNORMAL LOW (ref >39.00)
LDL CALC: 55 mg/dL (ref 0–99)
NONHDL: 91.13
Total CHOL/HDL Ratio: 4
Triglycerides: 181 mg/dL — ABNORMAL HIGH (ref 0.0–149.0)
VLDL: 36.2 mg/dL (ref 0.0–40.0)

## 2015-03-19 LAB — BASIC METABOLIC PANEL
BUN: 21 mg/dL (ref 6–23)
CALCIUM: 8.8 mg/dL (ref 8.4–10.5)
CO2: 29 mEq/L (ref 19–32)
Chloride: 106 mEq/L (ref 96–112)
Creatinine, Ser: 1.55 mg/dL — ABNORMAL HIGH (ref 0.40–1.50)
GFR: 49.9 mL/min — AB (ref >60.00)
GLUCOSE: 94 mg/dL (ref 70–99)
Potassium: 4.1 mEq/L (ref 3.5–5.1)
SODIUM: 142 meq/L (ref 135–145)

## 2015-03-19 LAB — PSA: PSA: 0.97 ng/mL (ref 0.10–4.00)

## 2015-03-19 LAB — TSH: TSH: 1.69 u[IU]/mL (ref 0.35–4.50)

## 2015-03-24 ENCOUNTER — Ambulatory Visit (INDEPENDENT_AMBULATORY_CARE_PROVIDER_SITE_OTHER): Payer: BLUE CROSS/BLUE SHIELD | Admitting: Family Medicine

## 2015-03-24 ENCOUNTER — Encounter: Payer: Self-pay | Admitting: Family Medicine

## 2015-03-24 VITALS — BP 112/80 | HR 64 | Temp 97.9°F | Ht 68.0 in | Wt 228.0 lb

## 2015-03-24 DIAGNOSIS — Z0000 Encounter for general adult medical examination without abnormal findings: Secondary | ICD-10-CM

## 2015-03-24 NOTE — Progress Notes (Signed)
Daniel Reddish, MD Phone: [phone]  Subjective:  Patient presents today for their annual physical. Chief complaint-noted.   See problem oriented charting- ROS- full  review of systems was completed and negative except for: No chest pain or shortness of breath. No headache or blurry vision.    The following were reviewed and entered/updated in epic: Past Medical History  Diagnosis Date  . Hypertension   . Hypercholesterolemia     With hypertriglyceridemia  . Glaucoma   . Kidney stones     hx hematuria as result. cystosopy normal  . Gout     allopurinol 300mg , colchicine prn in past, has not had flares  . CAD (coronary artery disease)     proximal LAD 50%, mid LAD 50-60%, mid to distal LAD 99%; mid circumflex 20%; proximal RCA 20%, distal RCA 50%; PDA 60-70%, proximal posterior AV groove 50%, proximal posterior lateral 50%.  There was a question of possible LVOT gradient;  s/p Resolute DES to mid-distal LAD 10/2010;  echocardiogram 11/17/10: EF 55-60%, mild LVH, grade 1 diastolic dysfunction, normal aortic valve, mild MR, PASP 32.   . Renal insufficiency   . Chicken pox   . Sleep apnea     cpap nightly  . Anemia    Patient Active Problem List   Diagnosis Date Noted  . CAD (coronary artery disease) 12/06/2010    Priority: High  . Mild aortic stenosis 08/11/2014    Priority: Medium  . Gout     Priority: Medium  . OSA (obstructive sleep apnea) 12/06/2010    Priority: Medium  . Renal insufficiency 12/06/2010    Priority: Medium  . Mixed hyperlipidemia 12/06/2010    Priority: Medium  . HTN (hypertension) 12/06/2010    Priority: Medium  . Carotid bruit 02/09/2012    Priority: Low  . Glaucoma 12/06/2010    Priority: Low  . Bradycardia 12/06/2010   Past Surgical History  Procedure Laterality Date  . Coronary stent placement  2012  . Cystoscopy/retrograde/ureteroscopy  03/26/2012    Procedure: CYSTOSCOPY/RETROGRADE/URETEROSCOPY;  Surgeon: [name], MD;  Location[facility]  [facility];  Service: Urology;  Laterality: Bilateral;  CYSTOSCOPY BILATERAL RETROGRADE PYELOGRAM AND POSSIBLE URETEROSCOPY    Family History  Problem Relation Age of Onset  . Hypertension Father   . CAD Father     CABG 72s  . Kidney disease Father     Stage IV  . Valvular heart disease Mother     AVR July 2015  . Diabetes Paternal Grandmother   . Colon cancer Neg Hx     Medications- reviewed and updated Current Outpatient Prescriptions  Medication Sig Dispense Refill  . allopurinol (ZYLOPRIM) 300 MG tablet Take 1 tablet (300 mg total) by mouth daily. 90 tablet 3  . amLODipine (NORVASC) 5 MG tablet Take 1 tablet (5 mg total) by mouth daily. 90 tablet 3  . aspirin 81 MG tablet Take 81 mg by mouth daily.      [name] atorvastatin (LIPITOR) 40 MG tablet Take 1 tablet (40 mg total) by mouth daily. 90 tablet 3  . clopidogrel (PLAVIX) 75 MG tablet Take 1 tablet (75 mg total) by mouth daily. 90 tablet 3  . lisinopril (PRINIVIL,ZESTRIL) 20 MG tablet Take 1 tablet (20 mg total) by mouth daily. 90 tablet 3  . Tafluprost (ZIOPTAN) 0.0015 % SOLN Apply to eye. One drop in both eyes at night    . TIMOLOL MALEATE OP Apply 6.8 mg/mL to eye every morning.     No current  facility-administered medications for this visit.    Allergies-reviewed and updated No Known Allergies  Social History   Social History  . Marital Status: Married    Spouse Name: N/A  . Number of Children: 4  . Years of Education: N/A   Occupational History  . Financial planner    Social History Main Topics  . Smoking status: Never Smoker   . Smokeless tobacco: Never Used  . Alcohol Use: No  . Drug Use: No  . Sexual Activity: Not Asked   Other Topics Concern  . 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 Oropharynx normal Neck: no thyromegaly CV: RRR 3/6 SEM unchanged, no rubs or gallops Lungs: CTAB no crackles, wheeze, rhonchi Abdomen: soft/nontender/nondistended/normal bowel sounds. No rebound or guarding.  Rectal: normal tone, normal size prostate, no masses or tenderness Ext: no edema Skin: warm, dry Neuro: grossly normal, moves all extremities, PERRLA  Assessment/Plan:  55 y.o. male presenting for annual physical.  Health Maintenance counseling: 1. Anticipatory guidance: Patient counseled regarding regular dental exams, eye exams, wearing seatbelts.  2. Risk factor reduction:  Advised patient of need for regular exercise (walking 30 minutes 4x a week) and diet rich and fruits and vegetables to reduce risk of heart attack and stroke. Long term goal 175 but over next 6-12 months to get under 200.  3. Immunizations/screenings/ancillary studies Health Maintenance Due  Topic Date Due  . Hepatitis C Screening - next labs 03-Jul-1960  . HIV Screening - declined 02/23/1976  . INFLUENZA VACCINE - offered, declined 09/29/2014   4. Prostate cancer screening- low risk based off PSA and rectal  Lab Results  Component Value Date   PSA 0.97 03/19/2015   5. Colon cancer screening - 12/09/2014 with 10 year follow up 6. Skin cancer screening- low risk above the waist exam today.   Coronary Artery Disease- compliant with statin and aspirin. Last saw card s8/9/16 with planned follow up 1 year with Dr. Angelena Form. Previously noncompliant with statin  Renal insufficiency- followed by Dr. Justin Mend CKD stage III with proteinuria on ace-i  OSA- wearing CPAP  Hypertension- controlled on lisinopril, amlodipine  Gout- allopurinol withouot flares  Return in about 6 months (around 09/21/2015)  for follow up- or sooner if needed. 1 year for physical. Return precautions advised.

## 2015-03-24 NOTE — Patient Instructions (Addendum)
Recommendations: 1. Over next year let's work to get your weight back down to around 200 with long range goal of 175.  2. Consider Hepatitis C with next physical labs- mention to lab at time of physical labs  Great job taking cholesterol medicine again- numbers look great

## 2015-08-06 DIAGNOSIS — H04123 Dry eye syndrome of bilateral lacrimal glands: Secondary | ICD-10-CM | POA: Diagnosis not present

## 2015-08-06 DIAGNOSIS — H401131 Primary open-angle glaucoma, bilateral, mild stage: Secondary | ICD-10-CM | POA: Diagnosis not present

## 2015-08-06 DIAGNOSIS — H524 Presbyopia: Secondary | ICD-10-CM | POA: Diagnosis not present

## 2015-10-01 ENCOUNTER — Other Ambulatory Visit: Payer: Self-pay

## 2015-10-01 ENCOUNTER — Telehealth: Payer: Self-pay | Admitting: Family Medicine

## 2015-10-01 MED ORDER — AMLODIPINE BESYLATE 5 MG PO TABS: 5.0000 mg | ORAL_TABLET | Freq: Every day | ORAL | 3 refills | Status: DC

## 2015-10-01 NOTE — Telephone Encounter (Signed)
`  Pt needs refill on amlodipine 5 mg #30 w/refills  [facility]. Pt last appt in jan 2017 next appt 2018

## 2015-10-01 NOTE — Telephone Encounter (Signed)
Prescription refill sent as requested.

## 2015-10-22 ENCOUNTER — Other Ambulatory Visit: Payer: Self-pay

## 2015-10-22 ENCOUNTER — Telehealth: Payer: Self-pay | Admitting: Family Medicine

## 2015-10-22 MED ORDER — ALLOPURINOL 300 MG PO TABS: 300.0000 mg | ORAL_TABLET | Freq: Every day | ORAL | 3 refills | Status: DC

## 2015-10-22 MED ORDER — CLOPIDOGREL BISULFATE 75 MG PO TABS: 75.0000 mg | ORAL_TABLET | Freq: Every day | ORAL | 3 refills | Status: DC

## 2015-10-22 MED ORDER — LISINOPRIL 20 MG PO TABS: 20.0000 mg | ORAL_TABLET | Freq: Every day | ORAL | 3 refills | Status: DC

## 2015-10-22 MED ORDER — ATORVASTATIN CALCIUM 40 MG PO TABS: 40.0000 mg | ORAL_TABLET | Freq: Every day | ORAL | 3 refills | Status: DC

## 2015-10-22 NOTE — Telephone Encounter (Signed)
Pt request refill   atorvastatin (LIPITOR) 40 MG tablet clopidogrel (PLAVIX) 75 MG tablet lisinopril (PRINIVIL,ZESTRIL) 20 MG tablet allopurinol (ZYLOPRIM) 300 MG tablet  All 90 day  [facility]

## 2015-10-22 NOTE — Telephone Encounter (Signed)
Prescription refill requests sent to pharmacy as requested.

## 2015-12-03 DIAGNOSIS — H524 Presbyopia: Secondary | ICD-10-CM | POA: Diagnosis not present

## 2015-12-03 DIAGNOSIS — H401131 Primary open-angle glaucoma, bilateral, mild stage: Secondary | ICD-10-CM | POA: Diagnosis not present

## 2015-12-03 DIAGNOSIS — H04123 Dry eye syndrome of bilateral lacrimal glands: Secondary | ICD-10-CM | POA: Diagnosis not present

## 2015-12-03 DIAGNOSIS — H5213 Myopia, bilateral: Secondary | ICD-10-CM | POA: Diagnosis not present

## 2015-12-07 DIAGNOSIS — H04123 Dry eye syndrome of bilateral lacrimal glands: Secondary | ICD-10-CM | POA: Diagnosis not present

## 2015-12-07 DIAGNOSIS — H401131 Primary open-angle glaucoma, bilateral, mild stage: Secondary | ICD-10-CM | POA: Diagnosis not present

## 2015-12-07 DIAGNOSIS — H34231 Retinal artery branch occlusion, right eye: Secondary | ICD-10-CM | POA: Diagnosis not present

## 2015-12-07 DIAGNOSIS — H30041 Focal chorioretinal inflammation, macular or paramacular, right eye: Secondary | ICD-10-CM | POA: Diagnosis not present

## 2015-12-08 DIAGNOSIS — N189 Chronic kidney disease, unspecified: Secondary | ICD-10-CM | POA: Diagnosis not present

## 2015-12-08 DIAGNOSIS — E669 Obesity, unspecified: Secondary | ICD-10-CM | POA: Diagnosis not present

## 2015-12-08 DIAGNOSIS — N183 Chronic kidney disease, stage 3 (moderate): Secondary | ICD-10-CM | POA: Diagnosis not present

## 2015-12-08 DIAGNOSIS — D631 Anemia in chronic kidney disease: Secondary | ICD-10-CM | POA: Diagnosis not present

## 2015-12-08 DIAGNOSIS — N2581 Secondary hyperparathyroidism of renal origin: Secondary | ICD-10-CM | POA: Diagnosis not present

## 2015-12-29 ENCOUNTER — Encounter: Payer: Self-pay | Admitting: Physician Assistant

## 2015-12-31 DIAGNOSIS — H34231 Retinal artery branch occlusion, right eye: Secondary | ICD-10-CM | POA: Diagnosis not present

## 2015-12-31 DIAGNOSIS — H401131 Primary open-angle glaucoma, bilateral, mild stage: Secondary | ICD-10-CM | POA: Diagnosis not present

## 2015-12-31 DIAGNOSIS — H524 Presbyopia: Secondary | ICD-10-CM | POA: Diagnosis not present

## 2015-12-31 DIAGNOSIS — H04123 Dry eye syndrome of bilateral lacrimal glands: Secondary | ICD-10-CM | POA: Diagnosis not present

## 2016-01-02 DIAGNOSIS — H6123 Impacted cerumen, bilateral: Secondary | ICD-10-CM | POA: Diagnosis not present

## 2016-01-02 DIAGNOSIS — H6091 Unspecified otitis externa, right ear: Secondary | ICD-10-CM | POA: Diagnosis not present

## 2016-01-04 DIAGNOSIS — H34231 Retinal artery branch occlusion, right eye: Secondary | ICD-10-CM | POA: Diagnosis not present

## 2016-01-11 ENCOUNTER — Encounter: Payer: Self-pay | Admitting: Physician Assistant

## 2016-01-11 NOTE — Progress Notes (Signed)
Cardiology Office Note    Date:  01/12/2016  ID:  Daniel Solomon, DOB Jul 16, 1960, MRN [id number] PCP:  Garret Reddish, MD  Cardiologist:  Angelena Form (last saw 2015)   Chief Complaint: overdue f/u CAD, also to evaluate retinal artery occlusion (referred by retinal specialist Dr. Lytle Butte with [facility] in [place])  History of Present Illness:  Daniel Solomon is a 55 y.o. male with history of CAD ([place] 10/2010 s/p DES to mid-distal LAD with residual nonobstructive disease), bicuspid AV, mild aortic stenosis, dilated aorta, HTN, OSA, hyperlipidemia, kidney stones, CKD stage III who presents for overdue followup. At last Ov 09/2014 he had self-discontinued his statin and fenofibrate, asking if it was necessary. 2D echo 09/2014: mild LVH, EF 55-60%, grade 1 DD, mild aortic stenosis, mildly dilated ascending aorta (value not given but aortic root 45m), mild LAE, mild MR. Last labs 03/2015 showed normal TSH, Cr 1.55, LDL 55, trig 181, normal LFTs. [facility] after he noticed a persistent visual field defect in his right eye that developed about 3 weeks ago. He was found to have a branch retinal artery occlusion in his right eye which remained stable over the last month. Opthalmology recommended he follow-up here to arrange carotid doppler and 2D echocardiogram as well as routine labs.   He presents back for follow-up today. Walks 30 minutes a day without any chest pain or SOB. He has not noticed any palpitations. EKG shows NSR with PACs. Right visual field defect persists. He does report a history of transient vision loss in the past as well - the upper half of his visual field. 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 No jaw claudication reported.     Cardiac cath 11/17/10: 1. Left main coronary artery: The vessel is normal in size and free  of significant disease.  2. Left anterior descending artery: The vessel is normal in size with  disease noted  throughout its course. Proximally, just before the  first septal branch, there is 50% stenosis. In the mid segment  right after second diagonal, there is another 50%-60% eccentric  lesion. In the mid-to-distal LAD before the third diagonal, there  is a 99% tubular stenosis. First diagonal is overall small in size  and free of significant disease. Second diagonal is normal in size  and free of significant disease. Third diagonal is very small in  size.  3. Left circumflex artery: The vessel is normal in size but  nondominant. It has minor irregularities without obstructive  disease. There is a 20% lesion in the mid segment right after OM- 1.  4. Right coronary artery: The vessel is very large in size and  dominant. There is a 20% proximal stenosis. There is minor  irregularities in the mid segment. Distally, right at PDA and the  AV groove bifurcation, there is a 50% stenosis. The PDA itself is  medium in size and diffusely diseased throughout its course with  60%-70% disease noted with plaque formation. The distal area is  too small. The posterior AV groove has a 50% proximal lesion and  it gives a large posterolateral branch which has a 50% proximal  stenosis. The second posterolateral branch is small in size and  free of significant disease.   Past Medical History:  Diagnosis Date  . Anemia   . Bicuspid aortic valve   . CAD (coronary artery disease)    proximal LAD 50%, mid LAD 50-60%, mid to  distal LAD 99%; mid circumflex 20%; proximal RCA 20%, distal RCA 50%; PDA 60-70%, proximal posterior AV groove 50%, proximal posterior lateral 50%.  There was a question of possible LVOT gradient;  s/p Resolute DES to mid-distal LAD 10/2010;  echocardiogram 11/17/10: EF 55-60%, mild LVH, grade 1 diastolic dysfunction, normal aortic valve, mild MR, PASP 32.   . Chicken pox   . CKD (chronic kidney disease), stage III   . Enlarged aorta ([place])   . Glaucoma   . Gout    allopurinol 327m, colchicine  prn in past, has not had flares  . Hypercholesterolemia    With hypertriglyceridemia  . Hypertension   . Kidney stones    hx hematuria as result. cystosopy normal  . Mild aortic stenosis   . Sleep apnea    cpap nightly    Past Surgical History:  Procedure Laterality Date  . CORONARY STENT PLACEMENT  2012  . CYSTOSCOPY/RETROGRADE/URETEROSCOPY  03/26/2012   Procedure: CYSTOSCOPY/RETROGRADE/URETEROSCOPY;  Surgeon: MClaybon Jabs MD;  [facility]  Service: Urology;  Laterality: Bilateral;  CYSTOSCOPY BILATERAL RETROGRADE PYELOGRAM AND POSSIBLE URETEROSCOPY    Current Medications: Current Outpatient Prescriptions  Medication Sig Dispense Refill  . allopurinol (ZYLOPRIM) 300 MG tablet Take 1 tablet (300 mg total) by mouth daily. 90 tablet 3  . amLODipine (NORVASC) 5 MG tablet Take 1 tablet (5 mg total) by mouth daily. 90 tablet 3  . aspirin 81 MG tablet Take 81 mg by mouth daily.      .Marland Kitchenatorvastatin (LIPITOR) 40 MG tablet Take 1 tablet (40 mg total) by mouth daily. 90 tablet 3  . clopidogrel (PLAVIX) 75 MG tablet Take 1 tablet (75 mg total) by mouth daily. 90 tablet 3  . lisinopril (PRINIVIL,ZESTRIL) 20 MG tablet Take 1 tablet (20 mg total) by mouth daily. 90 tablet 3  . Tafluprost (ZIOPTAN) 0.0015 % SOLN Apply to eye. One drop in both eyes at night    . TIMOLOL MALEATE OP Apply 6.8 mg/mL to eye every morning.     No current facility-administered medications for this visit.      Allergies:   Patient has no known allergies.   Social History   Social History  . Marital status: Married    Spouse name: N/A  . Number of children: 4  . Years of education: N/A   Occupational History  . [employer]   Social History Main Topics  . Smoking status: Never Smoker  . Smokeless tobacco: Never Used  . Alcohol use No  . Drug use: No  . Sexual activity: Not Asked   Other Topics Concern  . None   Social History Narrative   Family: Married, 4  children, 2 grandkids- both with red hair (124year old in 2016)      [place]ancial [place] [employer] [place], church activities- [employer]     Family History:  The patient's family history includes CAD in his father; Diabetes in his paternal grandmother; Hypertension in his father; Kidney disease in his father; Valvular heart disease in his mother.   ROS:   Please see the history of present illness.  All other systems are reviewed and otherwise negative.    PHYSICAL EXAM:   VS:  BP 122/74 (BP Location: Left Arm, Patient Position: Sitting)   Pulse (!) 59   Ht _0  (1.727 m)   Wt 232 lb 12.8 oz (105.6 kg)  BMI 35.40 kg/m   BMI: Body mass index is 35.4 kg/m. GEN: Well nourished, well developed WM in no acute distress  HEENT: normocephalic, atraumatic Neck: no JVD or masses. Faint bilateral carotid bruits. Cardiac: RRR;  Soft SEM RUSB. No rubs or gallops, no edema  Respiratory:  clear to auscultation bilaterally, normal work of breathing GI: soft, nontender, nondistended, + BS MS: no deformity or atrophy  Skin: warm and dry, no rash Neuro:  Alert and Oriented x 3, Strength and sensation are intact, follows commands Psych: euthymic mood, full affect  Wt Readings from Last 3 Encounters:  01/12/16 232 lb 12.8 oz (105.6 kg)  03/24/15 228 lb (103.4 kg)  12/09/14 220 lb (99.8 kg)      Studies/Labs Reviewed:   EKG:  EKG was ordered today and personally reviewed by me and demonstrates sinus bradycardia 59bpm with PACs, nonspecific changes.  Recent Labs: 03/19/2015: ALT 44; BUN 21; Creatinine, Ser 1.55; Hemoglobin 15.4; Platelets 145.0; Potassium 4.1; Sodium 142; TSH 1.69   Lipid Panel    Component Value Date/Time   CHOL 126 03/19/2015 0833   TRIG 181.0 (H) 03/19/2015 0833   HDL 34.60 (L) 03/19/2015 0833   CHOLHDL 4 03/19/2015 0833   VLDL 36.2 03/19/2015 0833   LDLCALC 55 03/19/2015 0833   LDLDIRECT 70.9 02/10/2012 0737     Additional studies/ records that were reviewed today include: Summarized above.    ASSESSMENT & PLAN:   1. Branch retinal artery occlusion of right eye - etiology not clear, question atherosclerotic versus cardioembolic. He reports compliance with aspirin and Plavix. Will proceed with screening labs as requested by retina specialist as well as 2D echocardiogram and carotid duplex. Will also check ESR/sed rate to evaluate for GCA as well as 30-day event monitor to evaluate for atrial fib.  2. CAD - stable, no recent angina. Continue ASA, statin, CCB. Presumably not on BB given his baseline HR 50s. 3. HTN - controlled. 4. HLD - continue statin and f/u lipids this AM. He did have a protein shake but no high sugar intake this AM. 5. Bicupid aortic valve with mild aortic stenosis - f/u 2D echocardiogram to assess stability. He does have bilateral carotid bruits. Unclear if this is related to AS or atherosclerotic disease. Carotid duplex as above. 6. Dilated aorta - f/u echocardiogram. 7. CKD stage III - rechecking baseline labs today.  Disposition: F/u with me or Dr. Angelena Form in 6 weeks to regroup on the above testing.   Medication Adjustments/Labs and Tests Ordered: Current medicines are reviewed at length with the patient today.  Concerns regarding medicines are outlined above. Medication changes, Labs and Tests ordered today are summarized above and listed in the Patient Instructions accessible in Encounters.   Raechel Ache PA-C  01/12/2016 8:11 AM    [facility] [address], [place], [place]  [address] Phone: [phone]; Fax: [phone]

## 2016-01-12 ENCOUNTER — Encounter: Payer: Self-pay | Admitting: Physician Assistant

## 2016-01-12 ENCOUNTER — Encounter (INDEPENDENT_AMBULATORY_CARE_PROVIDER_SITE_OTHER): Payer: Self-pay

## 2016-01-12 ENCOUNTER — Ambulatory Visit (INDEPENDENT_AMBULATORY_CARE_PROVIDER_SITE_OTHER): Payer: BLUE CROSS/BLUE SHIELD | Admitting: Physician Assistant

## 2016-01-12 ENCOUNTER — Other Ambulatory Visit: Payer: Self-pay | Admitting: Physician Assistant

## 2016-01-12 VITALS — BP 122/74 | HR 59 | Ht 68.0 in | Wt 232.8 lb

## 2016-01-12 DIAGNOSIS — N183 Chronic kidney disease, stage 3 unspecified: Secondary | ICD-10-CM

## 2016-01-12 DIAGNOSIS — I10 Essential (primary) hypertension: Secondary | ICD-10-CM | POA: Diagnosis not present

## 2016-01-12 DIAGNOSIS — H34231 Retinal artery branch occlusion, right eye: Secondary | ICD-10-CM | POA: Diagnosis not present

## 2016-01-12 DIAGNOSIS — I7789 Other specified disorders of arteries and arterioles: Secondary | ICD-10-CM

## 2016-01-12 DIAGNOSIS — I2510 Atherosclerotic heart disease of native coronary artery without angina pectoris: Secondary | ICD-10-CM

## 2016-01-12 DIAGNOSIS — I6523 Occlusion and stenosis of bilateral carotid arteries: Secondary | ICD-10-CM

## 2016-01-12 DIAGNOSIS — E785 Hyperlipidemia, unspecified: Secondary | ICD-10-CM | POA: Diagnosis not present

## 2016-01-12 DIAGNOSIS — I350 Nonrheumatic aortic (valve) stenosis: Secondary | ICD-10-CM

## 2016-01-12 LAB — CBC WITH DIFFERENTIAL/PLATELET
BASOS PCT: 0 %
Basophils Absolute: 0 cells/uL (ref 0–200)
EOS ABS: 200 {cells}/uL (ref 15–500)
Eosinophils Relative: 4 %
HEMATOCRIT: 44.9 % (ref 38.5–50.0)
Hemoglobin: 15.2 g/dL (ref 13.2–17.1)
LYMPHS ABS: 1500 {cells}/uL (ref 850–3900)
Lymphocytes Relative: 30 %
MCH: 32.2 pg (ref 27.0–33.0)
MCHC: 33.9 g/dL (ref 32.0–36.0)
MCV: 95.1 fL (ref 80.0–100.0)
MONO ABS: 400 {cells}/uL (ref 200–950)
MPV: 9.9 fL (ref 7.5–12.5)
Monocytes Relative: 8 %
Neutro Abs: 2900 cells/uL (ref 1500–7800)
Neutrophils Relative %: 58 %
Platelets: 150 10*3/uL (ref 140–400)
RBC: 4.72 MIL/uL (ref 4.20–5.80)
RDW: 14 % (ref 11.0–15.0)
WBC: 5 10*3/uL (ref 3.8–10.8)

## 2016-01-12 LAB — TSH: TSH: 1.42 mIU/L (ref 0.40–4.50)

## 2016-01-12 LAB — LIPID PANEL
CHOL/HDL RATIO: 3.3 ratio (ref <5.0)
Cholesterol: 122 mg/dL (ref <200)
HDL: 37 mg/dL — AB (ref >40)
LDL Cholesterol: 57 mg/dL (ref <100)
Triglycerides: 140 mg/dL (ref <150)
VLDL: 28 mg/dL (ref <30)

## 2016-01-12 LAB — COMPREHENSIVE METABOLIC PANEL
ALBUMIN: 3.9 g/dL (ref 3.6–5.1)
ALT: 38 U/L (ref 9–46)
AST: 25 U/L (ref 10–35)
Alkaline Phosphatase: 95 U/L (ref 40–115)
BUN: 20 mg/dL (ref 7–25)
CALCIUM: 9.1 mg/dL (ref 8.6–10.3)
CO2: 26 mmol/L (ref 20–31)
CREATININE: 1.81 mg/dL — AB (ref 0.70–1.33)
Chloride: 108 mmol/L (ref 98–110)
GLUCOSE: 93 mg/dL (ref 65–99)
Potassium: 4.3 mmol/L (ref 3.5–5.3)
SODIUM: 144 mmol/L (ref 135–146)
Total Bilirubin: 0.5 mg/dL (ref 0.2–1.2)
Total Protein: 6.6 g/dL (ref 6.1–8.1)

## 2016-01-12 LAB — HEMOGLOBIN A1C
Hgb A1c MFr Bld: 5.1 % (ref <5.7)
MEAN PLASMA GLUCOSE: 100 mg/dL

## 2016-01-12 NOTE — Patient Instructions (Addendum)
Medication Instructions:  Your physician recommends that you continue on your current medications as directed. Please refer to the Current Medication list given to you today.  Labwork: Your physician recommends that you have lab work today- CBC, TSH, lipid, CMET, Sed Rate, CRP and Hgb A1c.   Testing/Procedures: Your physician has requested that you have an echocardiogram. Echocardiography is a painless test that uses sound waves to create images of your heart. It provides your doctor with information about the size and shape of your heart and how well your heart's chambers and valves are working. This procedure takes approximately one hour. There are no restrictions for this procedure.  Your physician has requested that you have a carotid duplex. This test is an ultrasound of the carotid arteries in your neck. It looks at blood flow through these arteries that supply the brain with blood. Allow one hour for this exam. There are no restrictions or special instructions.  Your physician has recommended that you wear an event monitor- 30 days. Event monitors are medical devices that record the heart's electrical activity. Doctors most often [place] these monitors to diagnose arrhythmias. Arrhythmias are problems with the speed or rhythm of the heartbeat. The monitor is a small, portable device. You can wear one while you do your normal daily activities. This is usually used to diagnose what is causing palpitations/syncope (passing out).  Follow-Up: Your physician wants you to follow-up in: 6 weeks with Dr. Angelena Form or Melina Copa PA  If you need a refill on your cardiac medications before your next appointment, please call your pharmacy.

## 2016-01-13 ENCOUNTER — Encounter: Payer: Self-pay | Admitting: Physician Assistant

## 2016-01-13 ENCOUNTER — Telehealth: Payer: Self-pay | Admitting: *Deleted

## 2016-01-13 DIAGNOSIS — R7989 Other specified abnormal findings of blood chemistry: Secondary | ICD-10-CM

## 2016-01-13 LAB — C-REACTIVE PROTEIN: CRP: 1.1 mg/L (ref <8.0)

## 2016-01-13 LAB — SEDIMENTATION RATE: Sed Rate: 4 mm/hr (ref 0–20)

## 2016-01-13 NOTE — Telephone Encounter (Signed)
Pt aware of his lab results. He has been advised to make sure he is drinking plenty of noncaffeinated fluid, up to 64 oz daily and will have repeat bmet on11-30-17 when he comes in for his heart monitor Pt verbalized understanding. Order in [place].

## 2016-01-13 NOTE — Telephone Encounter (Signed)
-----   Message from Daniel Solomon, [place] sent at 01/13/2016  9:16 AM EST ----- Pls let patient know labs all look good except kidney function is slightly worse from his baseline (appears to be 1.5-1.6, is 1.8 by these labs). Please make sure he's drinking adequate noncaffeinated fluid up to 64oz per day, recheck BMET when he returns to have his monitor placed. Dayna Dunn PA-C

## 2016-01-14 ENCOUNTER — Other Ambulatory Visit: Payer: Self-pay

## 2016-01-14 ENCOUNTER — Ambulatory Visit (HOSPITAL_COMMUNITY): Payer: BLUE CROSS/BLUE SHIELD | Attending: Cardiology

## 2016-01-14 DIAGNOSIS — N183 Chronic kidney disease, stage 3 unspecified: Secondary | ICD-10-CM

## 2016-01-14 DIAGNOSIS — I10 Essential (primary) hypertension: Secondary | ICD-10-CM | POA: Diagnosis not present

## 2016-01-14 DIAGNOSIS — I7789 Other specified disorders of arteries and arterioles: Secondary | ICD-10-CM

## 2016-01-14 DIAGNOSIS — H34231 Retinal artery branch occlusion, right eye: Secondary | ICD-10-CM | POA: Diagnosis not present

## 2016-01-14 DIAGNOSIS — E785 Hyperlipidemia, unspecified: Secondary | ICD-10-CM

## 2016-01-14 DIAGNOSIS — I2510 Atherosclerotic heart disease of native coronary artery without angina pectoris: Secondary | ICD-10-CM | POA: Diagnosis not present

## 2016-01-14 DIAGNOSIS — I350 Nonrheumatic aortic (valve) stenosis: Secondary | ICD-10-CM

## 2016-01-27 ENCOUNTER — Other Ambulatory Visit: Payer: Self-pay | Admitting: Physician Assistant

## 2016-01-27 DIAGNOSIS — I4891 Unspecified atrial fibrillation: Secondary | ICD-10-CM

## 2016-01-27 DIAGNOSIS — I491 Atrial premature depolarization: Secondary | ICD-10-CM

## 2016-01-27 DIAGNOSIS — R001 Bradycardia, unspecified: Secondary | ICD-10-CM

## 2016-01-27 DIAGNOSIS — H34231 Retinal artery branch occlusion, right eye: Secondary | ICD-10-CM

## 2016-01-28 ENCOUNTER — Ambulatory Visit (INDEPENDENT_AMBULATORY_CARE_PROVIDER_SITE_OTHER): Payer: BLUE CROSS/BLUE SHIELD

## 2016-01-28 ENCOUNTER — Ambulatory Visit (HOSPITAL_COMMUNITY)
Admission: RE | Admit: 2016-01-28 | Discharge: 2016-01-28 | Disposition: A | Discharge status: Home / Self Care | Payer: BLUE CROSS/BLUE SHIELD | Source: Ambulatory Visit | Attending: Cardiology | Admitting: Cardiology

## 2016-01-28 ENCOUNTER — Other Ambulatory Visit: Payer: BLUE CROSS/BLUE SHIELD | Admitting: *Deleted

## 2016-01-28 DIAGNOSIS — I6523 Occlusion and stenosis of bilateral carotid arteries: Secondary | ICD-10-CM

## 2016-01-28 DIAGNOSIS — H34231 Retinal artery branch occlusion, right eye: Secondary | ICD-10-CM

## 2016-01-28 DIAGNOSIS — I4891 Unspecified atrial fibrillation: Secondary | ICD-10-CM

## 2016-01-28 DIAGNOSIS — R7989 Other specified abnormal findings of blood chemistry: Secondary | ICD-10-CM

## 2016-01-28 DIAGNOSIS — I491 Atrial premature depolarization: Secondary | ICD-10-CM | POA: Diagnosis not present

## 2016-01-28 DIAGNOSIS — R001 Bradycardia, unspecified: Secondary | ICD-10-CM | POA: Diagnosis not present

## 2016-01-28 LAB — BASIC METABOLIC PANEL
BUN: 27 mg/dL — ABNORMAL HIGH (ref 7–25)
CO2: 24 mmol/L (ref 20–31)
Calcium: 8.6 mg/dL (ref 8.6–10.3)
Chloride: 109 mmol/L (ref 98–110)
Creat: 1.81 mg/dL — ABNORMAL HIGH (ref 0.70–1.33)
Glucose, Bld: 98 mg/dL (ref 65–99)
Potassium: 4.2 mmol/L (ref 3.5–5.3)
Sodium: 142 mmol/L (ref 135–146)

## 2016-02-01 DIAGNOSIS — G4733 Obstructive sleep apnea (adult) (pediatric): Secondary | ICD-10-CM | POA: Diagnosis not present

## 2016-02-11 ENCOUNTER — Encounter: Payer: Self-pay | Admitting: Physician Assistant

## 2016-02-29 HISTORY — PX: COLONOSCOPY: SHX174

## 2016-03-01 DIAGNOSIS — H34239 Retinal artery branch occlusion, unspecified eye: Secondary | ICD-10-CM | POA: Insufficient documentation

## 2016-03-01 NOTE — Progress Notes (Signed)
Cardiology Office Note    Date:  03/02/2016  ID:  Daniel Solomon, DOB 1960-08-04, MRN [id number] PCP:  Garret Reddish, MD  Cardiologist:  Angelena Form (last saw 2015)   Chief Complaint: f/u testing for retinal artery occlusion --> atrial fib identified on event monitor  History of Present Illness:  Daniel Solomon is a 56 y.o. male with history of CAD ([place] 10/2010 s/p DES to mid-distal LAD with residual nonobstructive disease), baseline sinus bradycardia, bicuspid AV, mild aortic stenosis, dilated aorta, HTN, OSA, hyperlipidemia, kidney stones, CKD stage III (followed by Dr. Justin Mend) who presents to f/u testing. He was seen by Dr. Richardson Landry with [facility] recently after he noticed a persistent visual field defect in his right eye that developed about 3 weeks ago. He was found to have a branch retinal artery occlusion in his right eye which remained stable over the last month. He also has a prior history of transient vision loss in the past as well - the upper half of his visual field -- which was diagnosed as a retinal migraine at the time. Opthalmology recommended he follow-up here to arrange carotid doppler and 2D echocardiogram as well as routine labs. He had not had any jaw claudication or cardiac sx. 2D echo 01/14/16: mild LVH, EF 39-76%, normal diastolic function, mild aortic stenosis, mildly dilated aorta and aortic root. Carotid duplex: 1-39% ICA stenosis, abnormal waveforms in right vertebral artery. Labs showed K 4.2, Cr 1.81, CBC wnl, ESR/CRP/TSH wnl, LDL 57.   He presents back to clinic to review above testing. Preliminary review of cardiac event monitor demonstrated NSR/SB with PACs as well as episodes of atrial fib with controlled rate. (Our clinic monitor coordinator called [employer] to find out why we did not receive phone notification of the episodes of atrial fib.) The patient is actually in atrial fib today with a HR of 68. He feels well without complaint. He denies any chest pain,  SOB, palpitations or awareness of irregular heartbeat. No LEE, orthopnea, syncope. Denies any h/o bleeding on ASA/Plavix.    Past Medical History:  Diagnosis Date  . Anemia   . Bicuspid aortic valve   . CAD (coronary artery disease)    proximal LAD 50%, mid LAD 50-60%, mid to distal LAD 99%; mid circumflex 20%; proximal RCA 20%, distal RCA 50%; PDA 60-70%, proximal posterior AV groove 50%, proximal posterior lateral 50%.  There was a question of possible LVOT gradient;  s/p Resolute DES to mid-distal LAD 10/2010;  echocardiogram 11/17/10: EF 55-60%, mild LVH, grade 1 diastolic dysfunction, normal aortic valve, mild MR, PASP 32.   . Chicken pox   . CKD (chronic kidney disease), stage III   . Enlarged aorta ([place])   . Glaucoma   . Gout    allopurinol 329m, colchicine prn in past, has not had flares  . Hypercholesterolemia    With hypertriglyceridemia  . Hypertension   . Kidney stones    hx hematuria as result. cystosopy normal  . Mild aortic stenosis   . PAF (paroxysmal atrial fibrillation) ([place]    a. Documented on event monitor 01/2016.  .Marland KitchenPremature atrial contractions   . Sleep apnea    cpap nightly    Past Surgical History:  Procedure Laterality Date  . CORONARY STENT PLACEMENT  2012  . CYSTOSCOPY/RETROGRADE/URETEROSCOPY  03/26/2012   Procedure: CYSTOSCOPY/RETROGRADE/URETEROSCOPY;  Surgeon: MClaybon Jabs MD;  [facility]  Service: Urology;  Laterality: Bilateral;  CYSTOSCOPY BILATERAL RETROGRADE PYELOGRAM AND POSSIBLE  URETEROSCOPY    Current Medications: Current Outpatient Prescriptions  Medication Sig Dispense Refill  . allopurinol (ZYLOPRIM) 300 MG tablet Take 1 tablet (300 mg total) by mouth daily. 90 tablet 3  . amLODipine (NORVASC) 5 MG tablet Take 1 tablet (5 mg total) by mouth daily. 90 tablet 3  . aspirin 81 MG tablet Take 81 mg by mouth daily.      [name] atorvastatin (LIPITOR) 40 MG tablet Take 1 tablet (40 mg total) by mouth daily. 90 tablet  3  . clopidogrel (PLAVIX) 75 MG tablet Take 1 tablet (75 mg total) by mouth daily. 90 tablet 3  . lisinopril (PRINIVIL,ZESTRIL) 20 MG tablet Take 1 tablet (20 mg total) by mouth daily. 90 tablet 3  . Tafluprost (ZIOPTAN) 0.0015 % SOLN Apply to eye. One drop in both eyes at night    . TIMOLOL MALEATE OP Apply 6.8 mg/mL to eye every morning.     No current facility-administered medications for this visit.      Allergies:   Patient has no known allergies.   Social History   Social History  . Marital status: Married    Spouse name: N/A  . Number of children: 4  . Years of education: N/A   Occupational History  . [employer]   Social History Main Topics  . Smoking status: Never Smoker  . Smokeless tobacco: Never Used  . Alcohol use No  . Drug use: No  . Sexual activity: Not Asked   Other Topics Concern  . 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   ROS:   Please see the history of present illness. All other systems are reviewed and otherwise negative.    PHYSICAL EXAM:   VS:  BP 126/88   Pulse 72   Ht 5' 8" (1.727 m)   Wt 232 lb 9.6 oz (105.5 kg)   SpO2 99%   BMI 35.37 kg/m   BMI: Body mass index is 35.37 kg/m. GEN: Well nourished, well developed WM, in no acute distress  HEENT: normocephalic, atraumatic Neck: no JVD, carotid bruits, or masses Cardiac: Irregularly irregular, soft SEM RUSB. No rubs or gallops, no edema  Respiratory:  clear to auscultation bilaterally, normal work of breathing GI: soft, nontender, nondistended, + BS MS: no deformity or  atrophy  Skin: warm and dry, no rash Neuro:  Alert and Oriented x 3, Strength and sensation are intact, follows commands Psych: euthymic mood, full affect  Wt Readings from Last 3 Encounters:  03/02/16 232 lb 9.6 oz (105.5 kg)  01/12/16 232 lb 12.8 oz (105.6 kg)  03/24/15 228 lb (103.4 kg)      Studies/Labs Reviewed:   EKG:  EKG was ordered today and personally reviewed by me and demonstrates atrial fib 68bpm, no acute ST-T changes.  Recent Labs: 01/12/2016: ALT 38; Hemoglobin 15.2; Platelets 150; TSH 1.42 01/28/2016: BUN 27; Creat 1.81; Potassium 4.2; Sodium 142   Lipid Panel    Component Value Date/Time   CHOL 122 01/12/2016 0849   TRIG 140 01/12/2016  0849   HDL 37 (L) 01/12/2016 0849   CHOLHDL 3.3 01/12/2016 0849   VLDL 28 01/12/2016 0849   LDLCALC 57 01/12/2016 0849   LDLDIRECT 70.9 02/10/2012 0737    Additional studies/ records that were reviewed today include: Summarized above, plus: Cardiac cath 11/17/10: 1. Left main coronary artery: The vessel is normal in size and free  of significant disease.  2. Left anterior descending artery: The vessel is normal in size with  disease noted throughout its course. Proximally, just before the  first septal branch, there is 50% stenosis. In the mid segment  right after second diagonal, there is another 50%-60% eccentric  lesion. In the mid-to-distal LAD before the third diagonal, there  is a 99% tubular stenosis. First diagonal is overall small in size  and free of significant disease. Second diagonal is normal in size  and free of significant disease. Third diagonal is very small in  size.  3. Left circumflex artery: The vessel is normal in size but  nondominant. It has minor irregularities without obstructive  disease. There is a 20% lesion in the mid segment right after OM- 1.  4. Right coronary artery: The vessel is very large in size and  dominant. There is a 20% proximal stenosis. There is minor  irregularities in  the mid segment. Distally, right at PDA and the  AV groove bifurcation, there is a 50% stenosis. The PDA itself is  medium in size and diffusely diseased throughout its course with  60%-70% disease noted with plaque formation. The distal area is  too small. The posterior AV groove has a 50% proximal lesion and  it gives a large posterolateral branch which has a 50% proximal  stenosis. The second posterolateral branch is small in size and  free of significant disease.    ASSESSMENT & PLAN:   1. Branch retinal artery occlusion of right eye - good pickup by opthalmology. Atrial fib was identified on event monitoring. See below. 2. Newly recognized atrial fib - asymptomatic, paroxysmal in nature. Rate controlled when in atrial fib. CHADSVASC = 4 (HTN, embolic event, vascular disease). He would benefit from anticoagulation. Reviewed different options with patient - Coumadin versus NOACs. Will d/c ASA/Plavix and start Xarelto 43m qsupper. This was chosen for patient preference of once-daily dosing.  Creatinine clearance was calculated at 629mmin. His renal function will have to be periodically monitored as this would affect dosing if clearance falls below 50. Will arrange pharmD visit for education and surveillance BMET/CBC in 1 month. Reviewed options of rate control versus rhythm control. It appears the patient's atrial fib is paroxysmal in nature but he is completely asymptomatic. LV function is preserved. Antiarrhythmic therapy would be limited by his known CAD. Would continue rate control strategy and surveillance for symptoms at this time. 3. CAD - asymptomatic. Not on BB due to baseline bradycardia. 4. HTN - controlled. 5. HLD - continue statin therapy. 6. Bicupid aortic valve with mild aortic stenosis & dilated aorta - stable by recent echo. Further surveillance per primary cardiologist.  Disposition: F/u with Dr. McAngelena Form Medication Adjustments/Labs and Tests Ordered: Current medicines  are reviewed at length with the patient today.  Concerns regarding medicines are outlined above. Medication changes, Labs and Tests ordered today are summarized above and listed in the Patient Instructions accessible in Encounters.   Signed, DaMelina CopaA-C  03/02/2016 8:22 AM    [facility] [address]  [id number]: [phone]  [phone]; Fax: [phone]

## 2016-03-02 ENCOUNTER — Ambulatory Visit (INDEPENDENT_AMBULATORY_CARE_PROVIDER_SITE_OTHER): Payer: BLUE CROSS/BLUE SHIELD | Admitting: Physician Assistant

## 2016-03-02 ENCOUNTER — Encounter: Payer: Self-pay | Admitting: Physician Assistant

## 2016-03-02 ENCOUNTER — Encounter: Payer: Self-pay | Admitting: *Deleted

## 2016-03-02 VITALS — BP 126/88 | HR 72 | Ht 68.0 in | Wt 232.6 lb

## 2016-03-02 DIAGNOSIS — E785 Hyperlipidemia, unspecified: Secondary | ICD-10-CM

## 2016-03-02 DIAGNOSIS — I480 Paroxysmal atrial fibrillation: Secondary | ICD-10-CM

## 2016-03-02 DIAGNOSIS — I77810 Thoracic aortic ectasia: Secondary | ICD-10-CM

## 2016-03-02 DIAGNOSIS — I2510 Atherosclerotic heart disease of native coronary artery without angina pectoris: Secondary | ICD-10-CM | POA: Diagnosis not present

## 2016-03-02 DIAGNOSIS — H34231 Retinal artery branch occlusion, right eye: Secondary | ICD-10-CM

## 2016-03-02 DIAGNOSIS — I10 Essential (primary) hypertension: Secondary | ICD-10-CM | POA: Diagnosis not present

## 2016-03-02 DIAGNOSIS — I350 Nonrheumatic aortic (valve) stenosis: Secondary | ICD-10-CM

## 2016-03-02 MED ORDER — RIVAROXABAN 20 MG PO TABS: 20.0000 mg | ORAL_TABLET | Freq: Every day | ORAL | 0 refills | Status: DC

## 2016-03-02 MED ORDER — RIVAROXABAN 20 MG PO TABS: 20.0000 mg | ORAL_TABLET | Freq: Every day | ORAL | 11 refills | Status: DC

## 2016-03-02 NOTE — Progress Notes (Signed)
Our office was not notified the patient had episodes of atrial fibrillation on his event monitor.  His baseline recording was sinus bradycardia.  Evelena Asa from [place] was called to inquire why we were not notified.  According to [facility], notification was not made because all occurrences met stable parameters of heart rate.  Our physicians did not set up account for notification of first occurrence of atrial fibrillation as they did with [employer] monitoring service.  A request has been made for this to be changed and [facility] to be notified of first occurrence of atrial fibrillation.

## 2016-03-02 NOTE — Patient Instructions (Signed)
Medication Instructions:  1) STOP ASPIRIN 2) STOP PLAVIX 3) START XARELTO 20 mg daily with supper  Labwork: None  Testing/Procedures: None  Follow-Up: Your physician recommends that you schedule a follow-up appointment in [place] with our Pharmacist to see how you are doing on your Xarelto.  Your physician recommends that you schedule a follow-up appointment in: 3 MONTHS with Dr. Angelena Form.  Any Other Special Instructions Will Be Listed Below (If Applicable).     If you need a refill on your cardiac medications before your next appointment, please call your pharmacy.

## 2016-03-21 ENCOUNTER — Other Ambulatory Visit (INDEPENDENT_AMBULATORY_CARE_PROVIDER_SITE_OTHER): Payer: BLUE CROSS/BLUE SHIELD

## 2016-03-21 DIAGNOSIS — Z0000 Encounter for general adult medical examination without abnormal findings: Secondary | ICD-10-CM | POA: Diagnosis not present

## 2016-03-21 LAB — HEPATIC FUNCTION PANEL
ALK PHOS: 98 U/L (ref 39–117)
ALT: 37 U/L (ref 0–53)
AST: 19 U/L (ref 0–37)
Albumin: 3.8 g/dL (ref 3.5–5.2)
BILIRUBIN DIRECT: 0.2 mg/dL (ref 0.0–0.3)
BILIRUBIN TOTAL: 0.8 mg/dL (ref 0.2–1.2)
TOTAL PROTEIN: 6.4 g/dL (ref 6.0–8.3)

## 2016-03-21 LAB — LIPID PANEL
CHOLESTEROL: 109 mg/dL (ref 0–200)
HDL: 36.1 mg/dL — AB (ref >39.00)
LDL Cholesterol: 46 mg/dL (ref 0–99)
NonHDL: 73
TRIGLYCERIDES: 134 mg/dL (ref 0.0–149.0)
Total CHOL/HDL Ratio: 3
VLDL: 26.8 mg/dL (ref 0.0–40.0)

## 2016-03-21 LAB — CBC WITH DIFFERENTIAL/PLATELET
BASOS PCT: 0.4 % (ref 0.0–3.0)
Basophils Absolute: 0 10*3/uL (ref 0.0–0.1)
EOS ABS: 0.3 10*3/uL (ref 0.0–0.7)
EOS PCT: 5.1 % — AB (ref 0.0–5.0)
HCT: 44.1 % (ref 39.0–52.0)
Hemoglobin: 15.1 g/dL (ref 13.0–17.0)
LYMPHS ABS: 1.5 10*3/uL (ref 0.7–4.0)
Lymphocytes Relative: 30.7 % (ref 12.0–46.0)
MCHC: 34.1 g/dL (ref 30.0–36.0)
MCV: 95.5 fl (ref 78.0–100.0)
MONO ABS: 0.5 10*3/uL (ref 0.1–1.0)
Monocytes Relative: 9.3 % (ref 3.0–12.0)
NEUTROS ABS: 2.7 10*3/uL (ref 1.4–7.7)
Neutrophils Relative %: 54.5 % (ref 43.0–77.0)
PLATELETS: 133 10*3/uL — AB (ref 150.0–400.0)
RBC: 4.62 Mil/uL (ref 4.22–5.81)
RDW: 14 % (ref 11.5–15.5)
WBC: 4.9 10*3/uL (ref 4.0–10.5)

## 2016-03-21 LAB — TSH: TSH: 1.89 u[IU]/mL (ref 0.35–4.50)

## 2016-03-21 LAB — POC URINALSYSI DIPSTICK (AUTOMATED)
Bilirubin, UA: NEGATIVE
Glucose, UA: NEGATIVE
Ketones, UA: NEGATIVE
LEUKOCYTES UA: NEGATIVE
NITRITE UA: NEGATIVE
PH UA: 7
Spec Grav, UA: 1.025
UROBILINOGEN UA: 0.2

## 2016-03-21 LAB — BASIC METABOLIC PANEL
BUN: 23 mg/dL (ref 6–23)
CHLORIDE: 107 meq/L (ref 96–112)
CO2: 28 meq/L (ref 19–32)
CREATININE: 1.86 mg/dL — AB (ref 0.40–1.50)
Calcium: 8.7 mg/dL (ref 8.4–10.5)
GFR: 40.28 mL/min — ABNORMAL LOW (ref >60.00)
GLUCOSE: 91 mg/dL (ref 70–99)
POTASSIUM: 3.8 meq/L (ref 3.5–5.1)
Sodium: 143 mEq/L (ref 135–145)

## 2016-03-21 LAB — PSA: PSA: 0.88 ng/mL (ref 0.10–4.00)

## 2016-03-29 ENCOUNTER — Encounter: Payer: BLUE CROSS/BLUE SHIELD | Admitting: Family Medicine

## 2016-04-04 DIAGNOSIS — H34231 Retinal artery branch occlusion, right eye: Secondary | ICD-10-CM | POA: Diagnosis not present

## 2016-04-05 ENCOUNTER — Ambulatory Visit: Payer: Self-pay

## 2016-04-05 ENCOUNTER — Ambulatory Visit (INDEPENDENT_AMBULATORY_CARE_PROVIDER_SITE_OTHER): Payer: BLUE CROSS/BLUE SHIELD

## 2016-04-05 ENCOUNTER — Encounter: Payer: Self-pay | Admitting: Family Medicine

## 2016-04-05 ENCOUNTER — Ambulatory Visit (INDEPENDENT_AMBULATORY_CARE_PROVIDER_SITE_OTHER): Payer: BLUE CROSS/BLUE SHIELD | Admitting: Family Medicine

## 2016-04-05 VITALS — BP 130/82 | HR 77 | Temp 98.3°F | Ht 68.0 in | Wt 238.2 lb

## 2016-04-05 DIAGNOSIS — I4891 Unspecified atrial fibrillation: Secondary | ICD-10-CM

## 2016-04-05 DIAGNOSIS — Z0000 Encounter for general adult medical examination without abnormal findings: Secondary | ICD-10-CM | POA: Diagnosis not present

## 2016-04-05 DIAGNOSIS — Z5181 Encounter for therapeutic drug level monitoring: Secondary | ICD-10-CM | POA: Diagnosis not present

## 2016-04-05 LAB — BASIC METABOLIC PANEL
BUN / CREAT RATIO: 15 (ref 9–20)
BUN: 28 mg/dL — AB (ref 6–24)
CALCIUM: 8.9 mg/dL (ref 8.7–10.2)
CO2: 23 mmol/L (ref 18–29)
CREATININE: 1.89 mg/dL — AB (ref 0.76–1.27)
Chloride: 107 mmol/L — ABNORMAL HIGH (ref 96–106)
GFR calc non Af Amer: 39 mL/min/{1.73_m2} — ABNORMAL LOW (ref >59)
GFR, EST AFRICAN AMERICAN: 45 mL/min/{1.73_m2} — AB (ref >59)
Glucose: 95 mg/dL (ref 65–99)
Potassium: 4.1 mmol/L (ref 3.5–5.2)
Sodium: 146 mmol/L — ABNORMAL HIGH (ref 134–144)

## 2016-04-05 LAB — CBC
HEMATOCRIT: 44.1 % (ref 37.5–51.0)
HEMOGLOBIN: 15.5 g/dL (ref 13.0–17.7)
MCH: 32.9 pg (ref 26.6–33.0)
MCHC: 35.1 g/dL (ref 31.5–35.7)
MCV: 94 fL (ref 79–97)
Platelets: 161 10*3/uL (ref 150–379)
RBC: 4.71 x10E6/uL (ref 4.14–5.80)
RDW: 13.9 % (ref 12.3–15.4)
WBC: 5.3 10*3/uL (ref 3.4–10.8)

## 2016-04-05 NOTE — Patient Instructions (Signed)
No changes today in medicaitons  Glad you are following up with cardiology.   Weight loss is probably the most important change needed for you. Recommend regular exercise 150 minutes a week (if ok with cardiology) and healthy eating. Water is your best friend beverage wise and veggies are the most healthy food you can eat. Weight watchers is a good program and has an online version.

## 2016-04-05 NOTE — Patient Instructions (Signed)

## 2016-04-05 NOTE — Progress Notes (Signed)
Phone: [phone]  Subjective:  Patient presents today for their annual physical. Chief complaint-noted.   See problem oriented charting- ROS- full  review of systems was completed and negative including No chest pain or shortness of breath. No headache or blurry vision.   The following were reviewed and entered/updated in epic: Past Medical History:  Diagnosis Date  . Anemia   . Bicuspid aortic valve   . CAD (coronary artery disease)    proximal LAD 50%, mid LAD 50-60%, mid to distal LAD 99%; mid circumflex 20%; proximal RCA 20%, distal RCA 50%; PDA 60-70%, proximal posterior AV groove 50%, proximal posterior lateral 50%.  There was a question of possible LVOT gradient;  s/p Resolute DES to mid-distal LAD 10/2010;  echocardiogram 11/17/10: EF 55-60%, mild LVH, grade 1 diastolic dysfunction, normal aortic valve, mild MR, PASP 32.   . Chicken pox   . CKD (chronic kidney disease), stage III   . 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 Glaucoma   . Gout    allopurinol 300mg , colchicine prn in past, has not had flares  . Hypercholesterolemia    With hypertriglyceridemia  . Hypertension   . Kidney stones    hx hematuria as result. cystosopy normal  . Mild aortic stenosis   . PAF (paroxysmal atrial fibrillation) ([place])    a. Documented on event monitor 01/2016.  [name] Premature atrial contractions   . Sleep apnea    cpap nightly   Patient Active Problem List   Diagnosis Date Noted  . CAD (coronary artery disease) 12/06/2010    Priority: High  . Mild aortic stenosis 08/11/2014    Priority: Medium  . Gout     Priority: Medium  . OSA (obstructive sleep apnea) 12/06/2010    Priority: Medium  . Renal insufficiency 12/06/2010    Priority: Medium  . Mixed hyperlipidemia 12/06/2010    Priority: Medium  . HTN (hypertension) 12/06/2010    Priority: Medium  . Carotid bruit 02/09/2012    Priority: Low  . Glaucoma 12/06/2010    Priority: Low  . Paroxysmal atrial fibrillation ([place]) 03/02/2016  .  Branch retinal artery occlusion 03/01/2016  . Bradycardia 12/06/2010   Past Surgical History:  Procedure Laterality Date  . CORONARY STENT PLACEMENT  2012  . CYSTOSCOPY/RETROGRADE/URETEROSCOPY  03/26/2012   Procedure: CYSTOSCOPY/RETROGRADE/URETEROSCOPY;  Surgeon: [name], MD;  Location: [facility];  Service: Urology;  Laterality: Bilateral;  CYSTOSCOPY BILATERAL RETROGRADE PYELOGRAM AND POSSIBLE URETEROSCOPY    Family History  Problem Relation Age of Onset  . Hypertension Father   . CAD Father     CABG 70s  . Kidney disease Father     Stage IV  . Valvular heart disease Mother     AVR July 2015  . Diabetes Paternal Grandmother   . Colon cancer Neg Hx     Medications- reviewed and updated Current Outpatient Prescriptions  Medication Sig Dispense Refill  . allopurinol (ZYLOPRIM) 300 MG tablet Take 1 tablet (300 mg total) by mouth daily. 90 tablet 3  . amLODipine (NORVASC) 5 MG tablet Take 1 tablet (5 mg total) by mouth daily. 90 tablet 3  . atorvastatin (LIPITOR) 40 MG tablet Take 1 tablet (40 mg total) by mouth daily. 90 tablet 3  . dorzolamide-timolol (COSOPT) 22.3-6.8 MG/ML ophthalmic solution Place 1 drop into both eyes 2 (two) times daily.    [name] lisinopril (PRINIVIL,ZESTRIL) 20 MG tablet Take 1 tablet (20 mg total) by mouth daily. 90 tablet 3  . rivaroxaban (XARELTO) 20  MG TABS tablet Take 1 tablet (20 mg total) by mouth daily with supper. 30 tablet 11  . Tafluprost (ZIOPTAN) 0.0015 % SOLN Apply to eye. One drop in both eyes at night     No current facility-administered medications for this visit.     Allergies-reviewed and updated No Known Allergies  Social History   Social History  . Marital status: Married    Spouse name: N/A  . Number of children: 4  . Years of education: N/A   Occupational History  . [employer]ator [employer]   Social History Main Topics  . Smoking status: Never Smoker  . Smokeless tobacco: Never Used  . Alcohol use No   . Drug use: No  . Sexual activity: Not on file   Other Topics Concern  . Not on file   Social History Narrative   Family: Married, 4 children, 2 grandkids- both with red hair (61 year old in 2016)      Work: Financial planner in [place]   BS at [employer]: reading, church activities- [employer]    Objective: BP 130/82 (BP Location: Left Arm, Patient Position: Sitting, Cuff Size: Large)   Pulse 77   Temp 98.3 F (36.8 C) (Oral)   Ht 5\' 8"  (1.727 m)   Wt 238 lb 3.2 oz (108 kg)   SpO2 96%   BMI 36.22 kg/m  Gen: NAD, resting comfortably HEENT: Mucous membranes are moist. Oropharynx normal CV: RRR 3/6 SEM.  Lungs: CTAB no crackles, wheeze, rhonchi Abdomen: soft/nontender/nondistended/normal bowel sounds. No rebound or guarding. obese  Ext: no edema Skin: warm, dry Neuro: grossly normal, moves all extremities, PERRLA Rectal: normal tone, normal sized prostate, no masses or tenderness  Assessment/Plan:  56 y.o. male presenting for annual physical.  Health Maintenance counseling: 1. Anticipatory guidance: Patient counseled regarding regular dental exams q6 months, eye exams - q 3 months, wearing seatbelts.  2. Risk factor reduction:  Advised patient of need for regular exercise and diet rich and fruits and vegetables to reduce risk of heart attack and stroke. Exercise- 3 days a week. Diet-this is his big area to improve. Usually writing down what he is eating helps- he plans to do this. Goal over next year about 30 lbs off but honestly motivation seems very low Wt Readings from Last 3 Encounters:  03/02/16 232 lb 9.6 oz (105.5 kg)  01/12/16 232 lb 12.8 oz (105.6 kg)  03/24/15 228 lb (103.4 kg)   3. Immunizations/screenings/ancillary studies- declines flu shot Immunization History  Administered Date(s) Administered  . Td 03/01/2008   Health Maintenance Due  Topic Date Due  . Hepatitis C Screening - hopeful next years labs 08-21-60   4. Prostate cancer  screening- low risk exam and psa trend  Lab Results  Component Value Date   PSA 0.88 03/21/2016   PSA 0.97 03/19/2015   5. Colon cancer screening - 12/09/2014 with 10 year repeat 6. Skin cancer prevention- regular sunscreen used  Status of chronic or acute concerns  Atrial fibrillation- picked up as a result of retinal artery occlusion right eye. chadsvasc 4 and started on xarelto (d/c aspirin and plavix). No meds rate control needed as was bradycardic in past. xarelto dosed per cardiology- I would ask for their input again on this dosing. GFR near 40- wonder if he should be on 15mg  xarelto.   CAD- asymptomatic- bradycardic in pastso cannot be on beta blocker  biscuspid aortic valve with mild aortic stenosis  and dilated aorta- stable per recent echo  HTN- controlled on lisinopril and amlodipine  HLD- LDL less than 70 on atorvastatin  CKD III sees yearly for renal insufficiency. GFR right around 40. Down some from around 96 in last year.   OSA on cpap  Gout- doing well on allopurinol 300mg . No recent colchicine  Trace lysed blood in UA- sees Dr. Justin Mend in June and will repeat UA at that time. Microscopic was not added by lab as intended  Hair low platelets- will monitor yearly. Has had mildly low #s in the past- not drastically worsening.    No problem-specific Assessment & Plan notes found for this encounter.   No Follow-up on file.  No orders of the defined types were placed in this encounter.   Meds ordered this encounter  Medications  . dorzolamide-timolol (COSOPT) 22.3-6.8 MG/ML ophthalmic solution    Sig: Place 1 drop into both eyes 2 (two) times daily.    Return precautions advised.   Garret Reddish, MD

## 2016-04-05 NOTE — Progress Notes (Signed)
Pt was started on Xarelto 20mg  for afib on 03/02/16 by Melina Copa, NP.    Reviewed patients medication list.  Pt is not currently on any combined P-gp and strong CYP3A4 inhibitors/inducers (ketoconazole, traconazole, ritonavir, carbamazepine, phenytoin, rifampin, St. John's wort).  Reviewed labs.  SCr 1.89, Weight 108, CrCl- 67, ideal body weight adjusted CrCl-53.  Dose appropriate based on CrCl.   Hgb and HCT Within Normal Limits 15.5/44.1.  Pt saw primary MD today, given pt is stage III kidney disease, question wether pt's Xarelto dosage should be 15mg  vs 20mg  secondary to GFR near 40 per Dr Ansel Bong note.  Last Creat 1.86 on 03/21/16, weight today 108kg, CrCl 68.55.  With adjusted body weight CrCl is 53. Based on this pt is currently on appropriate dosage of Xarelto 20mg  QD. Will await CBC and BMP from today's visit.   Based on pt's Creat 1.89 on 04/06/15, wt 108kg, CrCl-67, ideal body weight adjusted CrCl 53, therefore pt is on appropriate dosage of Xarelto 20mg  QD.  Discussed with Fuller Canada, PharmD pt is to continue on Xarelto 20mg  QD follow-up in 6 months. Called spoke with pt advised to continue on same dosage of Xarelto 20mg  QD, made follow-up appt for 6 months on 10/04/16 at 11am.

## 2016-04-05 NOTE — Progress Notes (Signed)
Pre visit review using our clinic review tool, if applicable. No additional management support is needed unless otherwise documented below in the visit note. 

## 2016-04-07 DIAGNOSIS — H401131 Primary open-angle glaucoma, bilateral, mild stage: Secondary | ICD-10-CM | POA: Diagnosis not present

## 2016-04-07 DIAGNOSIS — H34231 Retinal artery branch occlusion, right eye: Secondary | ICD-10-CM | POA: Diagnosis not present

## 2016-04-07 DIAGNOSIS — H5213 Myopia, bilateral: Secondary | ICD-10-CM | POA: Diagnosis not present

## 2016-04-07 DIAGNOSIS — H30041 Focal chorioretinal inflammation, macular or paramacular, right eye: Secondary | ICD-10-CM | POA: Diagnosis not present

## 2016-06-10 ENCOUNTER — Encounter: Payer: Self-pay | Admitting: Cardiovascular Disease

## 2016-06-10 ENCOUNTER — Ambulatory Visit (INDEPENDENT_AMBULATORY_CARE_PROVIDER_SITE_OTHER): Payer: BLUE CROSS/BLUE SHIELD | Admitting: Cardiovascular Disease

## 2016-06-10 VITALS — BP 126/80 | HR 74 | Ht 68.0 in | Wt 238.4 lb

## 2016-06-10 DIAGNOSIS — I481 Persistent atrial fibrillation: Secondary | ICD-10-CM

## 2016-06-10 DIAGNOSIS — I6523 Occlusion and stenosis of bilateral carotid arteries: Secondary | ICD-10-CM

## 2016-06-10 DIAGNOSIS — I350 Nonrheumatic aortic (valve) stenosis: Secondary | ICD-10-CM

## 2016-06-10 DIAGNOSIS — I10 Essential (primary) hypertension: Secondary | ICD-10-CM | POA: Diagnosis not present

## 2016-06-10 DIAGNOSIS — E785 Hyperlipidemia, unspecified: Secondary | ICD-10-CM

## 2016-06-10 DIAGNOSIS — I4819 Other persistent atrial fibrillation: Secondary | ICD-10-CM

## 2016-06-10 DIAGNOSIS — I2510 Atherosclerotic heart disease of native coronary artery without angina pectoris: Secondary | ICD-10-CM | POA: Diagnosis not present

## 2016-06-10 MED ORDER — RIVAROXABAN 20 MG PO TABS: 20.0000 mg | ORAL_TABLET | Freq: Every day | ORAL | 2 refills | Status: DC

## 2016-06-10 NOTE — Patient Instructions (Signed)

## 2016-06-10 NOTE — Progress Notes (Signed)
Chief Complaint  Patient presents with  . Follow-up      History of Present Illness: 56 yo male with history of CAD, HTN, HLD, sinus bradycardia, bicuspid aortic valve with mild AS, OSA, thoracic aortic aneurysm, CKD stage III here today for cardiac follow up. He was admitted with unstable angina in September 2012 and was found to have severe stenosis in the mid to distal LAD treated with a 2.25 x 18 mm Resolute DES. He was also found to have moderate mid LAD and distal RCA disease. In December 2017 he was found to have a branch retinal artery occlusion in his right eye. Echo November 2017 with LVEF=55-60%, mild AS, mildly dilated aortic root. Carotid dopplers with mild disease. Event monitor showed atrial fibrillation. He was in atrial fibrillation at time of office visit here in January 2018. He was started on Xarelto January 2018.   He is here today for follow up. The patient denies any chest pain, dyspnea, palpitations, lower extremity edema, orthopnea, PND, dizziness, near syncope or syncope. His energy level is normal. He has no awareness of irregularity of his heart rhythm.   Primary Care Physician: Garret Reddish, MD   Past Medical History:  Diagnosis Date  . Anemia   . Bicuspid aortic valve   . CAD (coronary artery disease)    proximal LAD 50%, mid LAD 50-60%, mid to distal LAD 99%; mid circumflex 20%; proximal RCA 20%, distal RCA 50%; PDA 60-70%, proximal posterior AV groove 50%, proximal posterior lateral 50%.  There was a question of possible LVOT gradient;  s/p Resolute DES to mid-distal LAD 10/2010;  echocardiogram 11/17/10: EF 55-60%, mild LVH, grade 1 diastolic dysfunction, normal aortic valve, mild MR, PASP 32.   . Chicken pox   . CKD (chronic kidney disease), stage III   . 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 Glaucoma   . Gout    allopurinol 300mg , colchicine prn in past, has not had flares  . Hypercholesterolemia    With hypertriglyceridemia  . Hypertension   . Kidney stones      hx hematuria as result. cystosopy normal  . Mild aortic stenosis   . PAF (paroxysmal atrial fibrillation) ([place])    a. Documented on event monitor 01/2016.  [name] Premature atrial contractions   . Sleep apnea    cpap nightly    Past Surgical History:  Procedure Laterality Date  . CORONARY STENT PLACEMENT  2012  . CYSTOSCOPY/RETROGRADE/URETEROSCOPY  03/26/2012   Procedure: CYSTOSCOPY/RETROGRADE/URETEROSCOPY;  Surgeon: [name], MD;  Location[facility];  Service: Urology;  Laterality: Bilateral;  CYSTOSCOPY BILATERAL RETROGRADE PYELOGRAM AND POSSIBLE URETEROSCOPY    Current Outpatient Prescriptions  Medication Sig Dispense Refill  . allopurinol (ZYLOPRIM) 300 MG tablet Take 1 tablet (300 mg total) by mouth daily. 90 tablet 3  . amLODipine (NORVASC) 5 MG tablet Take 1 tablet (5 mg total) by mouth daily. 90 tablet 3  . atorvastatin (LIPITOR) 40 MG tablet Take 1 tablet (40 mg total) by mouth daily. 90 tablet 3  . dorzolamide-timolol (COSOPT) 22.3-6.8 MG/ML ophthalmic solution Place 1 drop into both eyes 2 (two) times daily.    [name] lisinopril (PRINIVIL,ZESTRIL) 20 MG tablet Take 1 tablet (20 mg total) by mouth daily. 90 tablet 3  . rivaroxaban (XARELTO) 20 MG TABS tablet Take 1 tablet (20 mg total) by mouth daily with supper. 90 tablet 2  . Tafluprost (ZIOPTAN) 0.0015 % SOLN Apply to eye. One drop in both eyes at night  No current facility-administered medications for this visit.     No Known Allergies  Social History   Social History  . Marital status: Married    Spouse name: N/A  . Number of children: 4  . Years of education: N/A   Occupational History  . [employer]ator [employer]   Social History Main Topics  . Smoking status: Never Smoker  . Smokeless tobacco: Never Used  . Alcohol use No  . Drug use: No  . Sexual activity: Not on file   Other Topics Concern  . 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 Hypertension Father   . CAD Father     CABG 27s  . Kidney disease Father     Stage IV  . Valvular heart disease Mother     AVR July 2015  . Diabetes Paternal Grandmother   . Colon cancer Neg Hx     Review of Systems:  As stated in the HPI and otherwise negative.   BP 126/80   Pulse 74   Ht 5\' 8"  (1.727 m)   Wt 238 lb 6.4 oz (108.1 kg)   BMI 36.25 kg/m   Physical Examination: General: Well developed, well nourished, NAD  HEENT: OP clear, mucus membranes moist  SKIN: warm, dry. No rashes. Neuro: No focal deficits  Musculoskeletal: Muscle strength 5/5 all ext  Psychiatric: Mood and affect normal  Neck: No JVD, no carotid bruits, no thyromegaly, no lymphadenopathy.  Lungs:Clear bilaterally, no wheezes, rhonci, crackles Cardiovascular: Regular rate and rhythm. No murmurs, gallops or rubs. Abdomen:Soft. Bowel sounds present. Non-tender.  Extremities: No lower extremity edema. Pulses are 2 + in the bilateral DP/PT.  Cardiac cath 11/17/10: 1. Left main coronary artery: The vessel is normal in size and free  of significant disease.  2. Left anterior descending artery: The vessel is normal in size with  disease noted throughout its course. Proximally, just before the  first septal branch, there is 50% stenosis. In the mid segment  right after second diagonal, there is another 50%-60% eccentric  lesion. In the mid-to-distal LAD before the third diagonal, there  is a 99% tubular stenosis. First diagonal is overall small in size  and free of significant disease. Second diagonal is normal in size  and free of significant disease. Third diagonal is very small in  size.  3. Left circumflex artery: The vessel is normal in size but  nondominant. It has minor irregularities  without obstructive  disease. There is a 20% lesion in the mid segment right after OM- 1.  4. Right coronary artery: The vessel is very large in size and  dominant. There is a 20% proximal stenosis. There is minor  irregularities in the mid segment. Distally, right at PDA and the  AV groove bifurcation, there is a 50% stenosis. The PDA itself is  medium in size and diffusely diseased throughout its course with  60%-70% disease noted with plaque formation. The distal area is  too small. The posterior AV groove has a 50% proximal lesion and  it gives a large posterolateral branch which has a 50% proximal  stenosis. The second posterolateral branch is small in  size and  free of significant disease.  Echo 01/13/17: Left ventricle: The cavity size was normal. Wall thickness was   increased in a pattern of mild LVH. Systolic function was normal.   The estimated ejection fraction was in the range of 55% to 60%.   Wall motion was normal; there were no regional wall motion   abnormalities. Left ventricular diastolic function parameters   were normal. - Aortic valve: Trileaflet; moderately calcified leaflets. There   was mild stenosis. Mean gradient (S): 12 mm Hg. Peak gradient   (S): 25 mm Hg. Valve area (VTI): 1.75 cm^2. - Aorta: Mildly dilated aortic root and ascending aorta. Aortic   root dimension: 37 mm (ED). Ascending aortic diameter: 39 mm (S). - Mitral valve: There was trivial regurgitation. - Right ventricle: The cavity size was normal. Systolic function   was normal. - Pulmonary arteries: PA peak pressure: 30 mm Hg (S). - Inferior vena cava: The vessel was normal in size. The   respirophasic diameter changes were in the normal range (>= 50%),   consistent with normal central venous pressure.  Impressions:  - Normal LV size with mild LV hypertrophy. EF 55-60%. Normal   diastolic function. Normal RV size and systolic function. Mild   aortic stenosis.  EKG:  EKG is not ordered  today. The ekg ordered today demonstrates   Recent Labs: 03/21/2016: ALT 37; Hemoglobin 15.1; TSH 1.89 04/05/2016: BUN 28; Creatinine, Ser 1.89; Platelets 161; Potassium 4.1; Sodium 146   Lipid Panel    Component Value Date/Time   CHOL 109 03/21/2016 0759   TRIG 134.0 03/21/2016 0759   HDL 36.10 (L) 03/21/2016 0759   CHOLHDL 3 03/21/2016 0759   VLDL 26.8 03/21/2016 0759   LDLCALC 46 03/21/2016 0759   LDLDIRECT 70.9 02/10/2012 0737     Wt Readings from Last 3 Encounters:  06/10/16 238 lb 6.4 oz (108.1 kg)  04/05/16 238 lb 3.2 oz (108 kg)  03/02/16 232 lb 9.6 oz (105.5 kg)     Other studies Reviewed: Additional studies/ records that were reviewed today include: . Review of the above records demonstrates:   Assessment and Plan:   1. CAD without angina: He is having no chest pain suggestive of angina.  He is on good medical therapy. Continue statin. No beta blocker with bradycardia.   2. HTN: BP is controlled. No changes.   3. HLD: LDL at goal. Continue statin.    4. Atrial fibrillation, persistent: He is in atrial fib today. He is rate controlled. Will continue Xarelto for anticoagulation. No bleeding issues.   5. Aortic stenosis: He likely as a bicuspid AV. Mild AS. Follow  6. Carotid artery disease: He is known to have mild bilateral carotid disease by dopplers 2017. Repeat in 2020.   Current medicines are reviewed at length with the patient today.  The patient does not have concerns regarding medicines.  The following changes have been made:  no change  Labs/ tests ordered today include:  No orders of the defined types were placed in this encounter.    Disposition:   FU with me in 12 months   Signed, Lauree Chandler, MD 06/10/2016 9:29 AM    [facility] [address], [place], [place]  [address] Phone: [phone]; Fax: [phone]

## 2016-08-01 DIAGNOSIS — H34231 Retinal artery branch occlusion, right eye: Secondary | ICD-10-CM | POA: Diagnosis not present

## 2016-08-04 DIAGNOSIS — H401131 Primary open-angle glaucoma, bilateral, mild stage: Secondary | ICD-10-CM | POA: Diagnosis not present

## 2016-08-04 DIAGNOSIS — H34231 Retinal artery branch occlusion, right eye: Secondary | ICD-10-CM | POA: Diagnosis not present

## 2016-08-04 DIAGNOSIS — H30041 Focal chorioretinal inflammation, macular or paramacular, right eye: Secondary | ICD-10-CM | POA: Diagnosis not present

## 2016-08-04 DIAGNOSIS — H04123 Dry eye syndrome of bilateral lacrimal glands: Secondary | ICD-10-CM | POA: Diagnosis not present

## 2016-09-23 ENCOUNTER — Other Ambulatory Visit: Payer: Self-pay

## 2016-09-23 MED ORDER — AMLODIPINE BESYLATE 5 MG PO TABS: 5.0000 mg | ORAL_TABLET | Freq: Every day | ORAL | 3 refills | Status: DC

## 2016-10-04 ENCOUNTER — Encounter: Payer: Self-pay | Admitting: Family Medicine

## 2016-10-04 ENCOUNTER — Ambulatory Visit (INDEPENDENT_AMBULATORY_CARE_PROVIDER_SITE_OTHER): Payer: BLUE CROSS/BLUE SHIELD | Admitting: Family Medicine

## 2016-10-04 ENCOUNTER — Ambulatory Visit (INDEPENDENT_AMBULATORY_CARE_PROVIDER_SITE_OTHER): Payer: BLUE CROSS/BLUE SHIELD

## 2016-10-04 VITALS — BP 120/84 | HR 87 | Temp 98.1°F | Ht 68.0 in | Wt 242.0 lb

## 2016-10-04 DIAGNOSIS — R319 Hematuria, unspecified: Secondary | ICD-10-CM | POA: Diagnosis not present

## 2016-10-04 DIAGNOSIS — I4891 Unspecified atrial fibrillation: Secondary | ICD-10-CM | POA: Diagnosis not present

## 2016-10-04 DIAGNOSIS — I480 Paroxysmal atrial fibrillation: Secondary | ICD-10-CM | POA: Diagnosis not present

## 2016-10-04 DIAGNOSIS — I10 Essential (primary) hypertension: Secondary | ICD-10-CM | POA: Diagnosis not present

## 2016-10-04 DIAGNOSIS — N289 Disorder of kidney and ureter, unspecified: Secondary | ICD-10-CM

## 2016-10-04 LAB — BASIC METABOLIC PANEL
BUN: 20 mg/dL (ref 6–23)
CALCIUM: 8.8 mg/dL (ref 8.4–10.5)
CO2: 29 mEq/L (ref 19–32)
Chloride: 107 mEq/L (ref 96–112)
Creatinine, Ser: 1.95 mg/dL — ABNORMAL HIGH (ref 0.40–1.50)
GFR: 38.07 mL/min — AB (ref >60.00)
Glucose, Bld: 146 mg/dL — ABNORMAL HIGH (ref 70–99)
Potassium: 3.7 mEq/L (ref 3.5–5.1)
SODIUM: 142 meq/L (ref 135–145)

## 2016-10-04 LAB — CBC WITH DIFFERENTIAL/PLATELET
BASOS PCT: 0.7 % (ref 0.0–3.0)
Basophils Absolute: 0 10*3/uL (ref 0.0–0.1)
EOS ABS: 0.1 10*3/uL (ref 0.0–0.7)
Eosinophils Relative: 3.3 % (ref 0.0–5.0)
HCT: 46.6 % (ref 39.0–52.0)
Hemoglobin: 15.5 g/dL (ref 13.0–17.0)
Lymphocytes Relative: 30.5 % (ref 12.0–46.0)
Lymphs Abs: 1.3 10*3/uL (ref 0.7–4.0)
MCHC: 33.3 g/dL (ref 30.0–36.0)
MCV: 99.8 fl (ref 78.0–100.0)
MONO ABS: 0.2 10*3/uL (ref 0.1–1.0)
Monocytes Relative: 4.8 % (ref 3.0–12.0)
NEUTROS ABS: 2.7 10*3/uL (ref 1.4–7.7)
Neutrophils Relative %: 60.7 % (ref 43.0–77.0)
PLATELETS: 143 10*3/uL — AB (ref 150.0–400.0)
RBC: 4.67 Mil/uL (ref 4.22–5.81)
RDW: 13.8 % (ref 11.5–15.5)
WBC: 4.4 10*3/uL (ref 4.0–10.5)

## 2016-10-04 LAB — URINALYSIS, MICROSCOPIC ONLY

## 2016-10-04 NOTE — Patient Instructions (Addendum)
Please speak with cardiology about whether you should be on 15mg  or 20mg  of xarelto. Last GFR was around 40 so my thought is consider 15mg  dose.   Lets set a goal of 20-25 lbs over next 6 months. I will reach out to you in 1 month to see how you are doing with efforts and recording in myfitness pal. So important for your long term health.

## 2016-10-04 NOTE — Assessment & Plan Note (Signed)
S: BMI 35-40 plus hypertension and hyperlipidemia. Low motivation. Has lost 50 lbs in past with using myfitness pal and tracking meals A/P: encouraged him to start recording again and discussed importance of weight loss. 6 month goal of 27-61 lbs certainly possible. Will message him 1 month to check in through [facility]

## 2016-10-04 NOTE — Progress Notes (Signed)
Pt was started on Xarelto 20mg  for afib on 03/02/16 by Dr Angelena Form.    Reviewed patients medication list.  Pt is not currently on any combined P-gp and strong CYP3A4 inhibitors/inducers (ketoconazole, traconazole, ritonavir, carbamazepine, phenytoin, rifampin, St. John's wort).  Reviewed labs.  SCr 1.95 on 10/04/16, Weight 109.8kg, CrCl- 66.47.  Dose appropriate based on CrCl.   Hgb and HCT Within Normal Limits 15.5/46.6 on 10/04/16.  Called pt to advise based on CrCl of 66.47 pt is on appropriate dosage of Xarelto 20mg  QD. LMOM TCB.    10/07/16 @ 1145-Pt returned call & he was advised of his lab results & instructed pt to continue taking Xarelto 20mg  daily & he verbalized understanding.  Pt due to follow-up with Cardiologist in March 2019.

## 2016-10-04 NOTE — Assessment & Plan Note (Signed)
S: Reports seeing Dr. Justin Mend in June and repeat UA with no blood- I do not have records of this at this time.  A/P: update BMET- with GFR in 40s- will have him ask cardiology about reducing xarelto dose. Get urine microscopic today.

## 2016-10-04 NOTE — Progress Notes (Signed)
Subjective:  Daniel Solomon is a 56 y.o. year old very pleasant male patient who presents for/with See problem oriented charting ROS- stable edema. No chest pain or shortness of breath. No blurry vision.    Past Medical History-  Patient Active Problem List   Diagnosis Date Noted  . Paroxysmal atrial fibrillation ([place]) 03/02/2016    Priority: High  . Branch retinal artery occlusion 03/01/2016    Priority: High  . CAD (coronary artery disease) 12/06/2010    Priority: High  . Mild aortic stenosis 08/11/2014    Priority: Medium  . Gout     Priority: Medium  . OSA (obstructive sleep apnea) 12/06/2010    Priority: Medium  . Renal insufficiency 12/06/2010    Priority: Medium  . Mixed hyperlipidemia 12/06/2010    Priority: Medium  . HTN (hypertension) 12/06/2010    Priority: Medium  . Bradycardia 12/06/2010    Priority: Medium  . Carotid bruit 02/09/2012    Priority: Low  . Glaucoma 12/06/2010    Priority: Low  . Morbid obesity ([place]) 10/04/2016    Medications- reviewed and updated Current Outpatient Prescriptions  Medication Sig Dispense Refill  . allopurinol (ZYLOPRIM) 300 MG tablet Take 1 tablet (300 mg total) by mouth daily. 90 tablet 3  . amLODipine (NORVASC) 5 MG tablet Take 1 tablet (5 mg total) by mouth daily. 90 tablet 3  . atorvastatin (LIPITOR) 40 MG tablet Take 1 tablet (40 mg total) by mouth daily. 90 tablet 3  . dorzolamide-timolol (COSOPT) 22.3-6.8 MG/ML ophthalmic solution Place 1 drop into both eyes 2 (two) times daily.    [name] lisinopril (PRINIVIL,ZESTRIL) 20 MG tablet Take 1 tablet (20 mg total) by mouth daily. 90 tablet 3  . rivaroxaban (XARELTO) 20 MG TABS tablet Take 1 tablet (20 mg total) by mouth daily with supper. 90 tablet 2  . Tafluprost (ZIOPTAN) 0.0015 % SOLN Apply to eye. One drop in both eyes at night     No current facility-administered medications for this visit.     Objective: BP 120/84 (BP Location: Left Arm, Patient Position: Sitting, Cuff  Size: Large)   Pulse 87   Temp 98.1 F (36.7 C) (Oral)   Ht 5\' 8"  (1.727 m)   Wt 242 lb (109.8 kg)   SpO2 95%   BMI 36.80 kg/m  Gen: NAD, resting comfortably CV: irregularly irregular. Faint systolic murmur.  Lungs: CTAB no crackles, wheeze, rhonchi Abdomen: soft/nontender/nondistended/normal bowel sounds. obese Ext: stable 1+ edema Skin: warm, dry Neuro: grossly normal, moves all extremities  Assessment/Plan:  Renal insufficiency S: Reports seeing Dr. Justin Mend in June and repeat UA with no blood- I do not have records of this at this time.  A/P: update BMET- with GFR in 40s- will have him ask cardiology about reducing xarelto dose. Get urine microscopic today.    HTN (hypertension) S: controlled on Lisinopril 10mg ,  amlodipine 5mg .  BP Readings from Last 3 Encounters:  10/04/16 120/84  06/10/16 126/80  04/05/16 130/82  A/P: We discussed blood pressure goal of <140/90. Continue current meds   Paroxysmal atrial fibrillation ([name]) S: appears to be in a fib. Has had brady issues in past so no beta blocker. On xarelto with elevated chadsvasc of 4.  A/P: dose of xarelto may need to be adjusted to 15mg  with GFR at 40 and creatinine at near 1.9- he will discuss with cardiology who he sees later today   Morbid obesity ([place]) S: BMI 35-40 plus hypertension and hyperlipidemia. Low motivation. Has lost  50 lbs in past with using myfitness pal and tracking meals A/P: encouraged him to start recording again and discussed importance of weight loss. 6 month goal of 63-78 lbs certainly possible. Will message him 1 month to check in through mychart   6 months CPE  Orders Placed This Encounter  Procedures  . CBC with Differential/Platelet  . Basic metabolic panel    [place]  . Microalbumin / creatinine urine ratio    [place]   Return precautions advised.  Garret Reddish, MD

## 2016-10-04 NOTE — Addendum Note (Signed)
Addended by: Marin Olp on: 10/04/2016 09:56 AM   Modules accepted: Orders

## 2016-10-04 NOTE — Patient Instructions (Signed)
A full discussion of the nature of anticoagulants has been carried out.  A benefit/risk analysis has been presented to the patient, so that they understand the justification for choosing anticoagulation with Xarelto at this time.  The need for compliance is stressed.  Pt is aware to take the medication once daily with the largest meal of the day.  Side effects of potential bleeding are discussed, including unusual colored urine or stools, coughing up blood or coffee ground emesis, nose bleeds or serious fall or head trauma.  Discussed signs and symptoms of stroke. The patient should avoid any OTC items containing aspirin or ibuprofen.  Avoid alcohol consumption.   Call if any signs of abnormal bleeding.  Discussed financial obligations and resolved any difficulty in obtaining medication.  Next lab test in 6 months.

## 2016-10-04 NOTE — Assessment & Plan Note (Signed)
S: appears to be in a fib. Has had brady issues in past so no beta blocker. On xarelto with elevated chadsvasc of 4.  A/P: dose of xarelto may need to be adjusted to 15mg  with GFR at 40 and creatinine at near 1.9- he will discuss with cardiology who he sees later today

## 2016-10-04 NOTE — Assessment & Plan Note (Signed)
S: controlled on Lisinopril 10mg ,  amlodipine 5mg .  BP Readings from Last 3 Encounters:  10/04/16 120/84  06/10/16 126/80  04/05/16 130/82  A/P: We discussed blood pressure goal of <140/90. Continue current meds

## 2016-10-05 ENCOUNTER — Telehealth: Payer: Self-pay | Admitting: *Deleted

## 2016-10-05 NOTE — Telephone Encounter (Signed)
Received fax from [facility] stating pt has been prescribed Clopidogrel and Xarelto. Chart reviewed and Clopidogrel was stopped at office visit on 03/02/16.  I placed call to pt to review medication list and left message to call back

## 2016-10-07 NOTE — Telephone Encounter (Signed)
I spoke with pt who confirms he is not taking Clopidogrel.

## 2016-10-10 ENCOUNTER — Other Ambulatory Visit: Payer: Self-pay | Admitting: Family Medicine

## 2016-10-24 DIAGNOSIS — G4733 Obstructive sleep apnea (adult) (pediatric): Secondary | ICD-10-CM | POA: Diagnosis not present

## 2016-11-02 ENCOUNTER — Encounter: Payer: Self-pay | Admitting: Family Medicine

## 2016-12-01 DIAGNOSIS — H5213 Myopia, bilateral: Secondary | ICD-10-CM | POA: Diagnosis not present

## 2016-12-01 DIAGNOSIS — H04123 Dry eye syndrome of bilateral lacrimal glands: Secondary | ICD-10-CM | POA: Diagnosis not present

## 2016-12-01 DIAGNOSIS — H34231 Retinal artery branch occlusion, right eye: Secondary | ICD-10-CM | POA: Diagnosis not present

## 2016-12-01 DIAGNOSIS — H401131 Primary open-angle glaucoma, bilateral, mild stage: Secondary | ICD-10-CM | POA: Diagnosis not present

## 2016-12-13 ENCOUNTER — Other Ambulatory Visit: Payer: Self-pay

## 2016-12-13 MED ORDER — LISINOPRIL 20 MG PO TABS: 20.0000 mg | ORAL_TABLET | Freq: Every day | ORAL | 3 refills | Status: DC

## 2017-01-24 DIAGNOSIS — N183 Chronic kidney disease, stage 3 (moderate): Secondary | ICD-10-CM | POA: Diagnosis not present

## 2017-01-24 DIAGNOSIS — N2581 Secondary hyperparathyroidism of renal origin: Secondary | ICD-10-CM | POA: Diagnosis not present

## 2017-01-24 DIAGNOSIS — E669 Obesity, unspecified: Secondary | ICD-10-CM | POA: Diagnosis not present

## 2017-01-24 DIAGNOSIS — D631 Anemia in chronic kidney disease: Secondary | ICD-10-CM | POA: Diagnosis not present

## 2017-01-24 DIAGNOSIS — N189 Chronic kidney disease, unspecified: Secondary | ICD-10-CM | POA: Diagnosis not present

## 2017-03-13 ENCOUNTER — Other Ambulatory Visit: Payer: Self-pay | Admitting: *Deleted

## 2017-03-13 MED ORDER — RIVAROXABAN 20 MG PO TABS: 20.0000 mg | ORAL_TABLET | Freq: Every day | ORAL | 0 refills | Status: DC

## 2017-03-30 DIAGNOSIS — H30041 Focal chorioretinal inflammation, macular or paramacular, right eye: Secondary | ICD-10-CM | POA: Diagnosis not present

## 2017-03-30 DIAGNOSIS — H524 Presbyopia: Secondary | ICD-10-CM | POA: Diagnosis not present

## 2017-03-30 DIAGNOSIS — H5213 Myopia, bilateral: Secondary | ICD-10-CM | POA: Diagnosis not present

## 2017-03-30 DIAGNOSIS — H04123 Dry eye syndrome of bilateral lacrimal glands: Secondary | ICD-10-CM | POA: Diagnosis not present

## 2017-03-30 DIAGNOSIS — H401131 Primary open-angle glaucoma, bilateral, mild stage: Secondary | ICD-10-CM | POA: Diagnosis not present

## 2017-03-30 DIAGNOSIS — H34231 Retinal artery branch occlusion, right eye: Secondary | ICD-10-CM | POA: Diagnosis not present

## 2017-05-30 ENCOUNTER — Encounter: Payer: Self-pay | Admitting: Physician Assistant

## 2017-05-30 ENCOUNTER — Ambulatory Visit (INDEPENDENT_AMBULATORY_CARE_PROVIDER_SITE_OTHER): Payer: BLUE CROSS/BLUE SHIELD | Admitting: Physician Assistant

## 2017-05-30 VITALS — BP 118/80 | HR 74 | Ht 68.0 in | Wt 252.8 lb

## 2017-05-30 DIAGNOSIS — I10 Essential (primary) hypertension: Secondary | ICD-10-CM

## 2017-05-30 DIAGNOSIS — E782 Mixed hyperlipidemia: Secondary | ICD-10-CM | POA: Diagnosis not present

## 2017-05-30 DIAGNOSIS — I350 Nonrheumatic aortic (valve) stenosis: Secondary | ICD-10-CM

## 2017-05-30 DIAGNOSIS — I2510 Atherosclerotic heart disease of native coronary artery without angina pectoris: Secondary | ICD-10-CM

## 2017-05-30 DIAGNOSIS — H34239 Retinal artery branch occlusion, unspecified eye: Secondary | ICD-10-CM

## 2017-05-30 DIAGNOSIS — N289 Disorder of kidney and ureter, unspecified: Secondary | ICD-10-CM

## 2017-05-30 DIAGNOSIS — I480 Paroxysmal atrial fibrillation: Secondary | ICD-10-CM

## 2017-05-30 NOTE — Patient Instructions (Signed)
Medication Instructions: Your physician recommends that you continue on your current medications as directed. Please refer to the Current Medication list given to you today.  Labwork: Your physician recommends that you return for lab work on Tuesday 06/06/17: BMET, CBC, Fasting Lipid Panel  Procedures/Testing: None Ordered  Follow-Up: Your physician wants you to follow-up in: 1 YEAR with Dr. Angelena Form. You will receive a reminder letter in the mail two months in advance. If you don't receive a letter, please call our office to schedule the follow-up appointment.   If you need a refill on your cardiac medications before your next appointment, please call your pharmacy.

## 2017-05-30 NOTE — Progress Notes (Signed)
Cardiology Office Note    Date:  05/30/2017   ID:  [name], DOB 1960-11-28, MRN [id number]  PCP:  Marin Olp, MD  Cardiologist: Lauree Chandler, MD  No chief complaint on file.   History of Present Illness:  Daniel Solomon is a 57 y.o. male with history of CAD status post DES to the mid to distal LAD 2012 also found to have moderate mid LAD and distal RCA disease.  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  Also has hypertension, HLD, bradycardia, bicuspid aortic valve with mild AS, thoracic aortic aneurysms, CKD stage III.  Last saw Dr. Angelena Form 05/2016 and was doing well.  Walks 30 min several days/week.  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  Denies any chest pain, palpitations, dyspnea, dyspnea on exertion, dizziness or presyncope.  Had kidneys checked by renal  09/2016 and creatinine was 1.95.  Past Medical History:  Diagnosis Date  . Anemia   . Bicuspid aortic valve   . CAD (coronary artery disease)    proximal LAD 50%, mid LAD 50-60%, mid to distal LAD 99%; mid circumflex 20%; proximal RCA 20%, distal RCA 50%; PDA 60-70%, proximal posterior AV groove 50%, proximal posterior lateral 50%.  There was a question of possible LVOT gradient;  s/p Resolute DES to mid-distal LAD 10/2010;  echocardiogram 11/17/10: EF 55-60%, mild LVH, grade 1 diastolic dysfunction, normal aortic valve, mild MR, PASP 32.   . Chicken pox   . CKD (chronic kidney disease), stage III ([place])   . Enlarged aorta ([place])   . Glaucoma   . Gout    allopurinol 300mg , colchicine prn in past, has not had flares  . Hypercholesterolemia    With hypertriglyceridemia  . Hypertension   . Kidney stones    hx hematuria as result. cystosopy normal  . Mild aortic stenosis   . PAF (paroxysmal atrial  fibrillation) ([place])    a. Documented on event monitor 01/2016.  [name] Premature atrial contractions   . Sleep apnea    cpap nightly    Past Surgical History:  Procedure Laterality Date  . CORONARY STENT PLACEMENT  2012  . CYSTOSCOPY/RETROGRADE/URETEROSCOPY  03/26/2012   Procedure: CYSTOSCOPY/RETROGRADE/URETEROSCOPY;  Surgeon: [name], MD;  Location: [facility];  Service: Urology;  Laterality: Bilateral;  CYSTOSCOPY BILATERAL RETROGRADE PYELOGRAM AND POSSIBLE URETEROSCOPY    Current Medications: Current Meds  Medication Sig  . allopurinol (ZYLOPRIM) 300 MG tablet TAKE 1 TABLET (300 MG TOTAL) BY MOUTH DAILY.  [name] amLODipine (NORVASC) 5 MG tablet Take 1 tablet (5 mg total) by mouth daily.  [name] atorvastatin (LIPITOR) 40 MG tablet TAKE 1 TABLET (40 MG TOTAL) BY MOUTH DAILY.  [name] dorzolamide-timolol (COSOPT) 22.3-6.8 MG/ML ophthalmic solution Place 1 drop into both eyes 2 (two) times daily.  [name] lisinopril (PRINIVIL,ZESTRIL) 20 MG tablet Take 1 tablet (20 mg total) by mouth daily.  . rivaroxaban (XARELTO) 20 MG TABS tablet Take 1 tablet (20 mg total) by mouth daily with supper.  . Tafluprost (ZIOPTAN) 0.0015 % SOLN Apply to eye. One drop in both eyes at night     Allergies:   Patient has no known allergies.   Social History   Socioeconomic History  . Marital status: Married    Spouse name: Not on file  . Number of children:  4  . Years of education: Not on file  . Highest education level: Not on file  Occupational History  . Occupation: Lawyer: [employer]  . Financial resource strain: Not on file  . Food insecurity:    Worry: Not on file    Inability: Not on file  . Transportation needs:    Medical: Not on file    Non-medical: Not on file  Tobacco Use  . Smoking status: Never Smoker  . Smokeless tobacco: Never Used  Substance and Sexual Activity  . Alcohol use: No  . Drug use: No  . Sexual activity: Not on file  Lifestyle  .  Physical activity:    Days per week: Not on file    Minutes per session: Not on file  . Stress: Not on file  Relationships  . Social connections:    Talks on phone: Not on file    Gets together: Not on file    Attends religious service: Not on file    Active member of club or organization: Not on file    Attends meetings of clubs or organizations: Not on file    Relationship status: Not on file  Other Topics Concern  . 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   ROS:   Please see the history of present illness.    Review of Systems  Constitution: Negative.  HENT: Negative.   Cardiovascular: Negative.   Respiratory: Negative.   Endocrine: Negative.   Hematologic/Lymphatic: Negative.   Musculoskeletal: Negative.   Gastrointestinal: Negative.   Genitourinary: Negative.   Neurological: Negative.    All other systems reviewed and are negative.   PHYSICAL EXAM:   VS:  BP 118/80   Pulse 74   Ht 5\' 8"  (1.727 m)   Wt 252 lb 12.8 oz (114.7 kg)   SpO2 95%   BMI 38.44 kg/m   Physical Exam  GEN: Well nourished, well developed, in no acute distress  Neck: no JVD, carotid bruits, or masses Cardiac:RRR; 1/6 to 2/6 systolic murmur at the left sternal border and right sternal border Respiratory:  clear to auscultation bilaterally, normal work of breathing GI: soft, nontender, nondistended, + BS Ext: Trace of ankle edema without cyanosis, clubbing, Good distal pulses bilaterally Neuro:  Alert and Oriented x 3, Strength and sensation are intact Psych: euthymic mood, full  affect  Wt Readings from Last 3 Encounters:  05/30/17 252 lb 12.8 oz (114.7 kg)  10/04/16 242 lb (109.8 kg)  06/10/16 238 lb 6.4 oz (108.1 kg)      Studies/Labs Reviewed:   EKG:  EKG is ordered today.  The ekg ordered today demonstrates atrial fibrillation at 58 bpm  Recent Labs: 10/04/2016: BUN 20; Creatinine, Ser 1.95; Hemoglobin 15.5; Platelets 143.0; Potassium 3.7; Sodium 142   Lipid Panel    Component Value Date/Time   CHOL 109 03/21/2016 0759   TRIG 134.0 03/21/2016 0759   HDL 36.10 (L) 03/21/2016 0759   CHOLHDL 3 03/21/2016 0759  VLDL 26.8 03/21/2016 0759   LDLCALC 46 03/21/2016 0759   LDLDIRECT 70.9 02/10/2012 0737    Additional studies/ records that were reviewed today include:   Cardiac cath 11/17/10: 1. Left main coronary artery: The vessel is normal in size and free  of significant disease.  2. Left anterior descending artery: The vessel is normal in size with  disease noted throughout its course. Proximally, just before the  first septal branch, there is 50% stenosis. In the mid segment  right after second diagonal, there is another 50%-60% eccentric  lesion. In the mid-to-distal LAD before the third diagonal, there  is a 99% tubular stenosis. First diagonal is overall small in size  and free of significant disease. Second diagonal is normal in size  and free of significant disease. Third diagonal is very small in  size.  3. Left circumflex artery: The vessel is normal in size but  nondominant. It has minor irregularities without obstructive  disease. There is a 20% lesion in the mid segment right after OM- 1.  4. Right coronary artery: The vessel is very large in size and  dominant. There is a 20% proximal stenosis. There is minor  irregularities in the mid segment. Distally, right at PDA and the  AV groove bifurcation, there is a 50% stenosis. The PDA itself is  medium in size and diffusely diseased throughout its course with  60%-70% disease noted with  plaque formation. The distal area is  too small. The posterior AV groove has a 50% proximal lesion and  it gives a large posterolateral branch which has a 50% proximal  stenosis. The second posterolateral branch is small in size and  free of significant disease.   Echo 01/13/17: Left ventricle: The cavity size was normal. Wall thickness was   increased in a pattern of mild LVH. Systolic function was normal.   The estimated ejection fraction was in the range of 55% to 60%.   Wall motion was normal; there were no regional wall motion   abnormalities. Left ventricular diastolic function parameters   were normal. - Aortic valve: Trileaflet; moderately calcified leaflets. There   was mild stenosis. Mean gradient (S): 12 mm Hg. Peak gradient   (S): 25 mm Hg. Valve area (VTI): 1.75 cm^2. - Aorta: Mildly dilated aortic root and ascending aorta. Aortic   root dimension: 37 mm (ED). Ascending aortic diameter: 39 mm (S). - Mitral valve: There was trivial regurgitation. - Right ventricle: The cavity size was normal. Systolic function   was normal. - Pulmonary arteries: PA peak pressure: 30 mm Hg (S). - Inferior vena cava: The vessel was normal in size. The   respirophasic diameter changes were in the normal range (>= 50%),   consistent with normal central venous pressure.   Impressions:   - Normal LV size with mild LV hypertrophy. EF 55-60%. Normal   diastolic function. Normal RV size and systolic function. Mild   aortic stenosis.      ASSESSMENT:    1. Coronary artery disease involving native coronary artery of native heart without angina pectoris   2. Paroxysmal atrial fibrillation ([name])   3. Essential hypertension   4. Mild aortic stenosis   5. Branch retinal artery occlusion, unspecified laterality   6. Mixed hyperlipidemia   7. Renal insufficiency      PLAN:  In order of problems listed above:  CAD status post DES to the mid distal LAD 2012 with residual nonobstructive  disease in the LAD and RCA-doing well  without angina.  Continue Lipitor.  Check fasting labs.  Follow-up with Dr. Angelena Form in 1 year.  Persistent atrial fibrillation rate controlled on Xarelto follow-up renal function.  Not on rate lowering medications because of bradycardia  Essential hypertension blood pressure well controlled on low-dose lisinopril and amlodipine  Mild aortic stenosis mild murmur on exam and no symptoms.  We will hold off on repeat echo.  Branch retinal artery occlusion 2017 on Xarelto  Mixed hyperlipidemia continue Lipitor check fasting labs  Renal insufficiency followed by renal, last creatinine 1.958/2018.  Will recheck.    Medication Adjustments/Labs and Tests Ordered: Current medicines are reviewed at length with the patient today.  Concerns regarding medicines are outlined above.  Medication changes, Labs and Tests ordered today are listed in the Patient Instructions below. There are no Patient Instructions on file for this visit.   Sumner Boast, PA-C  05/30/2017 10:45 AM    [facility] [address], [place], [place]  [address] Phone: [phone]; Fax: [phone]

## 2017-06-06 ENCOUNTER — Other Ambulatory Visit: Payer: BLUE CROSS/BLUE SHIELD

## 2017-06-06 DIAGNOSIS — I10 Essential (primary) hypertension: Secondary | ICD-10-CM

## 2017-06-06 DIAGNOSIS — I350 Nonrheumatic aortic (valve) stenosis: Secondary | ICD-10-CM

## 2017-06-06 DIAGNOSIS — H34239 Retinal artery branch occlusion, unspecified eye: Secondary | ICD-10-CM

## 2017-06-06 DIAGNOSIS — E782 Mixed hyperlipidemia: Secondary | ICD-10-CM | POA: Diagnosis not present

## 2017-06-06 DIAGNOSIS — N289 Disorder of kidney and ureter, unspecified: Secondary | ICD-10-CM

## 2017-06-06 DIAGNOSIS — I480 Paroxysmal atrial fibrillation: Secondary | ICD-10-CM | POA: Diagnosis not present

## 2017-06-06 LAB — CBC WITH DIFFERENTIAL/PLATELET
BASOS: 0 %
Basophils Absolute: 0 10*3/uL (ref 0.0–0.2)
EOS (ABSOLUTE): 0.2 10*3/uL (ref 0.0–0.4)
EOS: 3 %
HEMATOCRIT: 45.4 % (ref 37.5–51.0)
Hemoglobin: 15.6 g/dL (ref 13.0–17.7)
IMMATURE GRANS (ABS): 0 10*3/uL (ref 0.0–0.1)
IMMATURE GRANULOCYTES: 0 %
Lymphocytes Absolute: 1.9 10*3/uL (ref 0.7–3.1)
Lymphs: 35 %
MCH: 32.9 pg (ref 26.6–33.0)
MCHC: 34.4 g/dL (ref 31.5–35.7)
MCV: 96 fL (ref 79–97)
MONOS ABS: 0.4 10*3/uL (ref 0.1–0.9)
Monocytes: 8 %
NEUTROS ABS: 2.9 10*3/uL (ref 1.4–7.0)
Neutrophils: 54 %
PLATELETS: 138 10*3/uL — AB (ref 150–379)
RBC: 4.74 x10E6/uL (ref 4.14–5.80)
RDW: 13.9 % (ref 12.3–15.4)
WBC: 5.5 10*3/uL (ref 3.4–10.8)

## 2017-06-06 LAB — LIPID PANEL
Chol/HDL Ratio: 3.9 ratio (ref 0.0–5.0)
Cholesterol, Total: 133 mg/dL (ref 100–199)
HDL: 34 mg/dL — AB (ref >39)
LDL Calculated: 58 mg/dL (ref 0–99)
Triglycerides: 207 mg/dL — ABNORMAL HIGH (ref 0–149)
VLDL CHOLESTEROL CAL: 41 mg/dL — AB (ref 5–40)

## 2017-06-06 LAB — BASIC METABOLIC PANEL
BUN/Creatinine Ratio: 14 (ref 9–20)
BUN: 28 mg/dL — ABNORMAL HIGH (ref 6–24)
CO2: 19 mmol/L — ABNORMAL LOW (ref 20–29)
CREATININE: 1.97 mg/dL — AB (ref 0.76–1.27)
Calcium: 8.7 mg/dL (ref 8.7–10.2)
Chloride: 107 mmol/L — ABNORMAL HIGH (ref 96–106)
GFR calc Af Amer: 43 mL/min/{1.73_m2} — ABNORMAL LOW (ref >59)
GFR, EST NON AFRICAN AMERICAN: 37 mL/min/{1.73_m2} — AB (ref >59)
Glucose: 99 mg/dL (ref 65–99)
POTASSIUM: 3.9 mmol/L (ref 3.5–5.2)
SODIUM: 145 mmol/L — AB (ref 134–144)

## 2017-06-08 ENCOUNTER — Other Ambulatory Visit: Payer: Self-pay | Admitting: Internal Medicine

## 2017-06-08 NOTE — Telephone Encounter (Signed)
Pt last saw Ermalinda Barrios on 05/30/17, last labs 06/06/17 Creat 1.97, age 57, weight 114.7kg, CrCl 67.93, based on CrCl pt is on appropriate dosage of Xarelto 20mg  QD.  Will refill rx.

## 2017-07-20 DIAGNOSIS — H34231 Retinal artery branch occlusion, right eye: Secondary | ICD-10-CM | POA: Diagnosis not present

## 2017-07-20 DIAGNOSIS — H04123 Dry eye syndrome of bilateral lacrimal glands: Secondary | ICD-10-CM | POA: Diagnosis not present

## 2017-07-20 DIAGNOSIS — H524 Presbyopia: Secondary | ICD-10-CM | POA: Diagnosis not present

## 2017-07-20 DIAGNOSIS — H401131 Primary open-angle glaucoma, bilateral, mild stage: Secondary | ICD-10-CM | POA: Diagnosis not present

## 2017-08-04 DIAGNOSIS — H3581 Retinal edema: Secondary | ICD-10-CM | POA: Diagnosis not present

## 2017-08-04 DIAGNOSIS — H34231 Retinal artery branch occlusion, right eye: Secondary | ICD-10-CM | POA: Diagnosis not present

## 2017-09-11 ENCOUNTER — Other Ambulatory Visit: Payer: Self-pay | Admitting: Family Medicine

## 2017-09-13 ENCOUNTER — Other Ambulatory Visit: Payer: Self-pay | Admitting: Family Medicine

## 2017-10-05 ENCOUNTER — Other Ambulatory Visit: Payer: Self-pay | Admitting: Family Medicine

## 2017-11-01 ENCOUNTER — Other Ambulatory Visit: Payer: Self-pay

## 2017-11-01 MED ORDER — ALLOPURINOL 300 MG PO TABS: 300.0000 mg | ORAL_TABLET | Freq: Every day | ORAL | 0 refills | Status: DC

## 2017-11-13 ENCOUNTER — Other Ambulatory Visit: Payer: Self-pay

## 2017-11-13 MED ORDER — ATORVASTATIN CALCIUM 40 MG PO TABS: 40.0000 mg | ORAL_TABLET | Freq: Every day | ORAL | 0 refills | Status: DC

## 2017-11-16 DIAGNOSIS — H04123 Dry eye syndrome of bilateral lacrimal glands: Secondary | ICD-10-CM | POA: Diagnosis not present

## 2017-11-16 DIAGNOSIS — H30041 Focal chorioretinal inflammation, macular or paramacular, right eye: Secondary | ICD-10-CM | POA: Diagnosis not present

## 2017-11-16 DIAGNOSIS — H401131 Primary open-angle glaucoma, bilateral, mild stage: Secondary | ICD-10-CM | POA: Diagnosis not present

## 2017-11-16 DIAGNOSIS — H34231 Retinal artery branch occlusion, right eye: Secondary | ICD-10-CM | POA: Diagnosis not present

## 2017-11-29 ENCOUNTER — Other Ambulatory Visit: Payer: Self-pay | Admitting: Family Medicine

## 2017-12-01 ENCOUNTER — Other Ambulatory Visit: Payer: Self-pay | Admitting: Family Medicine

## 2017-12-05 ENCOUNTER — Other Ambulatory Visit: Payer: Self-pay | Admitting: Internal Medicine

## 2017-12-05 NOTE — Telephone Encounter (Signed)
Pt is a 74 yr. Old male who saw Lenze, Leonard on 05/30/17. Weight at that time was 114.7Kg. Last SCr was 1.97 on 06/06/17. CrCl is 68 mL/min. Will refill Xarelto 20mg  QD.

## 2017-12-08 ENCOUNTER — Other Ambulatory Visit: Payer: Self-pay | Admitting: Family Medicine

## 2017-12-08 ENCOUNTER — Other Ambulatory Visit: Payer: Self-pay

## 2017-12-08 DIAGNOSIS — H34231 Retinal artery branch occlusion, right eye: Secondary | ICD-10-CM | POA: Diagnosis not present

## 2017-12-08 DIAGNOSIS — H35033 Hypertensive retinopathy, bilateral: Secondary | ICD-10-CM | POA: Diagnosis not present

## 2017-12-09 NOTE — Telephone Encounter (Signed)
He needs a visit- has been over a year

## 2017-12-11 NOTE — Telephone Encounter (Signed)
Called patient's home phone # and left voicemail msg requesting call back.  CRM created.

## 2017-12-13 ENCOUNTER — Other Ambulatory Visit: Payer: Self-pay | Admitting: Family Medicine

## 2017-12-14 ENCOUNTER — Ambulatory Visit (INDEPENDENT_AMBULATORY_CARE_PROVIDER_SITE_OTHER): Payer: BLUE CROSS/BLUE SHIELD | Admitting: Family Medicine

## 2017-12-14 ENCOUNTER — Encounter: Payer: Self-pay | Admitting: Family Medicine

## 2017-12-14 VITALS — BP 128/86 | HR 66 | Temp 98.5°F | Resp 16 | Wt 239.0 lb

## 2017-12-14 DIAGNOSIS — M1A9XX0 Chronic gout, unspecified, without tophus (tophi): Secondary | ICD-10-CM | POA: Diagnosis not present

## 2017-12-14 DIAGNOSIS — E782 Mixed hyperlipidemia: Secondary | ICD-10-CM

## 2017-12-14 DIAGNOSIS — I10 Essential (primary) hypertension: Secondary | ICD-10-CM

## 2017-12-14 DIAGNOSIS — I480 Paroxysmal atrial fibrillation: Secondary | ICD-10-CM | POA: Diagnosis not present

## 2017-12-14 DIAGNOSIS — I77810 Thoracic aortic ectasia: Secondary | ICD-10-CM

## 2017-12-14 DIAGNOSIS — N183 Chronic kidney disease, stage 3 unspecified: Secondary | ICD-10-CM

## 2017-12-14 DIAGNOSIS — Z1159 Encounter for screening for other viral diseases: Secondary | ICD-10-CM

## 2017-12-14 MED ORDER — ATORVASTATIN CALCIUM 40 MG PO TABS: 40.0000 mg | ORAL_TABLET | Freq: Every day | ORAL | 2 refills | Status: DC

## 2017-12-14 MED ORDER — AMLODIPINE BESYLATE 5 MG PO TABS: 5.0000 mg | ORAL_TABLET | Freq: Every day | ORAL | 2 refills | Status: DC

## 2017-12-14 MED ORDER — ALLOPURINOL 300 MG PO TABS: 300.0000 mg | ORAL_TABLET | Freq: Every day | ORAL | 2 refills | Status: DC

## 2017-12-14 MED ORDER — LISINOPRIL 20 MG PO TABS: 20.0000 mg | ORAL_TABLET | Freq: Every day | ORAL | 2 refills | Status: DC

## 2017-12-14 NOTE — Progress Notes (Addendum)
Subjective:  Daniel Solomon is a 57 y.o. year old very pleasant male patient who presents for/with See problem oriented charting ROS- No chest pain or shortness of breath. No headache or blurry vision.    Past Medical History-  Patient Active Problem List   Diagnosis Date Noted  . Paroxysmal atrial fibrillation ([place]) 03/02/2016    Priority: High  . Branch retinal artery occlusion 03/01/2016    Priority: High  . CAD (coronary artery disease) 12/06/2010    Priority: High  . Mild aortic stenosis 08/11/2014    Priority: Medium  . Gout     Priority: Medium  . OSA (obstructive sleep apnea) 12/06/2010    Priority: Medium  . CKD (chronic kidney disease), stage III ([place]) 12/06/2010    Priority: Medium  . Mixed hyperlipidemia 12/06/2010    Priority: Medium  . HTN (hypertension) 12/06/2010    Priority: Medium  . Bradycardia 12/06/2010    Priority: Medium  . Carotid bruit 02/09/2012    Priority: Low  . Glaucoma 12/06/2010    Priority: Low  . Morbid obesity ([place]) 10/04/2016    Medications- reviewed and updated Current Outpatient Medications  Medication Sig Dispense Refill  . allopurinol (ZYLOPRIM) 300 MG tablet Take 1 tablet (300 mg total) by mouth daily. 90 tablet 2  . amLODipine (NORVASC) 5 MG tablet Take 1 tablet (5 mg total) by mouth daily. 90 tablet 2  . atorvastatin (LIPITOR) 40 MG tablet Take 1 tablet (40 mg total) by mouth daily. 90 tablet 2  . dorzolamide-timolol (COSOPT) 22.3-6.8 MG/ML ophthalmic solution Place 1 drop into both eyes 2 (two) times daily.    [name] lisinopril (PRINIVIL,ZESTRIL) 20 MG tablet Take 1 tablet (20 mg total) by mouth daily. 90 tablet 2  . Tafluprost (ZIOPTAN) 0.0015 % SOLN Apply to eye. One drop in both eyes at night    . XARELTO 20 MG TABS tablet TAKE 1 TABLET DAILY WITH SUPPER 90 tablet 1   No current facility-administered medications for this visit.     Objective: BP 128/86   Pulse 66   Temp 98.5 F (36.9 C) (Oral)   Resp 16   Wt 239 lb (108.4  kg)   SpO2 96%   BMI 36.34 kg/m  Gen: NAD, resting comfortably Some cerumen in bilateral ear canals nonobstructive.  Oropharynx normal CV: RRR no murmurs rubs or gallops Lungs: CTAB no crackles, wheeze, rhonchi Abdomen: soft/nontender/nondistended/normal bowel sounds.  Ext: no edema Skin: warm, dry  Assessment/Plan:  Other notes: 1. on 2 different eye drops through optho for glaucoma.  2. Had hepatitis B- asked him to drop off copy of this or send through mychart  HTN (hypertension) S: controlled on amlodipine 5 mg, lisinopril 20mg  BP Readings from Last 3 Encounters:  12/14/17 128/86  05/30/17 118/80  10/04/16 120/84  A/P: We discussed blood pressure goal of <140/90. Continue current meds  Mixed hyperlipidemia S: well controlled on atorvastatin 40mg - checked in April by cardiology  A/P: continue current medicine  CKD (chronic kidney disease), stage III (Daniel Solomon) S:  has seen Dr. Justin Solomon sometime in last year. No changes recommended at that time. Still avoiding nsaids.  A/P: will update kidney test today- encouraged him to make sure to see Dr. Justin Solomon at least yearly or q6 months if he prefers. On ace-I in case proteinuric element  Gout S: 0 flares in last year on allopurinol 300mg  A/P: update uric acid levle, continue current medicine  Paroxysmal atrial fibrillation ([name]) Follows with cardiology. On xarelto as anticoagulant.  Will get bmet- he states he discussed possibly reducing dose of xarelto to 15 mg to cardiology last visit- if crcl remains below 50 I will ask them again about dose adjustment at cardiology.  No rate control due to history of bradycardia  Morbid obesity ([name]) BMI >35 with hypertension and hyperlipidemia. Healthy eating, regular exercise and weight loss are advisable   Dilated aortic root (Daniel Solomon) Noted on echo 2017 by cardiology- they did not suggest repeat at last visit- will continue to follow along for their recommendations.   Future Appointments  Date Time  Provider [place]  06/20/2018  9:20 AM Marin Olp, MD [facility]   Return in about 6 months (around 06/15/2018) for physical.  Lab/Order associations: Encounter for hepatitis C screening test for low risk patient - Plan: Hepatitis C antibody  Essential hypertension - Plan: CBC, Comprehensive metabolic panel  Chronic gout without tophus, unspecified cause, unspecified site - Plan: Uric acid  Mixed hyperlipidemia  CKD (chronic kidney disease), stage III ([name])  Paroxysmal atrial fibrillation (Daniel Solomon)  Meds ordered this encounter  Medications  . lisinopril (PRINIVIL,ZESTRIL) 20 MG tablet    Sig: Take 1 tablet (20 mg total) by mouth daily.    Dispense:  90 tablet    Refill:  2  . atorvastatin (LIPITOR) 40 MG tablet    Sig: Take 1 tablet (40 mg total) by mouth daily.    Dispense:  90 tablet    Refill:  2  . amLODipine (NORVASC) 5 MG tablet    Sig: Take 1 tablet (5 mg total) by mouth daily.    Dispense:  90 tablet    Refill:  2  . allopurinol (ZYLOPRIM) 300 MG tablet    Sig: Take 1 tablet (300 mg total) by mouth daily.    Dispense:  90 tablet    Refill:  2   Return precautions advised.  Daniel Reddish, MD

## 2017-12-14 NOTE — Patient Instructions (Addendum)
Please stop by lab before you go  If you could- send me dates of prior hepatitis B shots- perhaps upload a pdf on mychart with your card  Will be due for tetanus at next physical.   Schedule physical for 6 months before you leave

## 2017-12-14 NOTE — Assessment & Plan Note (Signed)
S: well controlled on atorvastatin 40mg - checked in April by cardiology  A/P: continue current medicine

## 2017-12-14 NOTE — Assessment & Plan Note (Signed)
S: 0 flares in last year on allopurinol 300mg  A/P: update uric acid levle, continue current medicine

## 2017-12-14 NOTE — Assessment & Plan Note (Signed)
S: controlled on amlodipine 5 mg, lisinopril 20mg  BP Readings from Last 3 Encounters:  12/14/17 128/86  05/30/17 118/80  10/04/16 120/84  A/P: We discussed blood pressure goal of <140/90. Continue current meds

## 2017-12-14 NOTE — Assessment & Plan Note (Addendum)
Follows with cardiology. On xarelto as anticoagulant. Will get bmet- he states he discussed possibly reducing dose of xarelto to 15 mg to cardiology last visit- if crcl remains below 50 I will ask them again about dose adjustment at cardiology.  No rate control due to history of bradycardia

## 2017-12-14 NOTE — Assessment & Plan Note (Signed)
S:  has seen Dr. Justin Mend sometime in last year. No changes recommended at that time. Still avoiding nsaids.  A/P: will update kidney test today- encouraged him to make sure to see Dr. Justin Mend at least yearly or q6 months if he prefers. On ace-I in case proteinuric element

## 2017-12-14 NOTE — Assessment & Plan Note (Signed)
Noted on echo 2017 by cardiology- they did not suggest repeat at last visit- will continue to follow along for their recommendations.

## 2017-12-14 NOTE — Assessment & Plan Note (Signed)
BMI >35 with hypertension and hyperlipidemia. Healthy eating, regular exercise and weight loss are advisable

## 2018-02-19 DIAGNOSIS — G4733 Obstructive sleep apnea (adult) (pediatric): Secondary | ICD-10-CM | POA: Diagnosis not present

## 2018-03-13 DIAGNOSIS — H04123 Dry eye syndrome of bilateral lacrimal glands: Secondary | ICD-10-CM | POA: Diagnosis not present

## 2018-03-13 DIAGNOSIS — H401131 Primary open-angle glaucoma, bilateral, mild stage: Secondary | ICD-10-CM | POA: Diagnosis not present

## 2018-03-13 DIAGNOSIS — H34231 Retinal artery branch occlusion, right eye: Secondary | ICD-10-CM | POA: Diagnosis not present

## 2018-04-24 DIAGNOSIS — N189 Chronic kidney disease, unspecified: Secondary | ICD-10-CM | POA: Diagnosis not present

## 2018-04-24 DIAGNOSIS — N183 Chronic kidney disease, stage 3 (moderate): Secondary | ICD-10-CM | POA: Diagnosis not present

## 2018-04-24 DIAGNOSIS — D631 Anemia in chronic kidney disease: Secondary | ICD-10-CM | POA: Diagnosis not present

## 2018-04-24 DIAGNOSIS — N2581 Secondary hyperparathyroidism of renal origin: Secondary | ICD-10-CM | POA: Diagnosis not present

## 2018-04-24 DIAGNOSIS — I129 Hypertensive chronic kidney disease with stage 1 through stage 4 chronic kidney disease, or unspecified chronic kidney disease: Secondary | ICD-10-CM | POA: Diagnosis not present

## 2018-04-24 LAB — BASIC METABOLIC PANEL
BUN: 33 — AB (ref 4–21)
CREATININE: 2.3 — AB (ref 0.6–1.3)
GLUCOSE: 88
Potassium: 3.9 (ref 3.4–5.3)
SODIUM: 142 (ref 137–147)

## 2018-04-24 LAB — CBC AND DIFFERENTIAL: Hemoglobin: 15.9 (ref 13.5–17.5)

## 2018-05-18 ENCOUNTER — Encounter: Payer: Self-pay | Admitting: Family Medicine

## 2018-05-27 ENCOUNTER — Other Ambulatory Visit: Payer: Self-pay | Admitting: Cardiovascular Disease

## 2018-05-28 NOTE — Telephone Encounter (Signed)
Age 58, weight 108kg, SCr 2.3 from 04/24/18, CrCl 73mL/min. Last OV within 1 year.

## 2018-05-29 DIAGNOSIS — N183 Chronic kidney disease, stage 3 (moderate): Secondary | ICD-10-CM | POA: Diagnosis not present

## 2018-05-29 DIAGNOSIS — N2581 Secondary hyperparathyroidism of renal origin: Secondary | ICD-10-CM | POA: Diagnosis not present

## 2018-06-20 ENCOUNTER — Encounter: Payer: BLUE CROSS/BLUE SHIELD | Admitting: Family Medicine

## 2018-06-29 DIAGNOSIS — N183 Chronic kidney disease, stage 3 (moderate): Secondary | ICD-10-CM | POA: Diagnosis not present

## 2018-07-02 ENCOUNTER — Telehealth: Payer: Self-pay | Admitting: Cardiovascular Disease

## 2018-07-02 NOTE — Telephone Encounter (Signed)
° ° °  VIDEO/Doximity visit on 07/05/2018.     Consent sent via MyChart   -  07/02/2018

## 2018-07-04 NOTE — Progress Notes (Signed)
Virtual Visit via Video Note   This visit type was conducted due to national recommendations for restrictions regarding the COVID-19 Pandemic (e.g. social distancing) in an effort to limit this patient's exposure and mitigate transmission in our community.  Due to his co-morbid illnesses, this patient is at least at moderate risk for complications without adequate follow up.  This format is felt to be most appropriate for this patient at this time.  All issues noted in this document were discussed and addressed.  A limited physical exam was performed with this format.  Please refer to the patient's chart for his consent to telehealth for [facility].   Date:  07/06/2018   ID:  [name], DOB Jul 09, 1960, MRN [id number]  Patient Location: Home Provider Location: Home  PCP:  Marin Olp, MD  Cardiologist:  Lauree Chandler, MD  Electrophysiologist:  None   Evaluation Performed:  Follow-Up Visit  Chief Complaint:  Follow up CAD  History of Present Illness:    Daniel Solomon is a 58 y.o. male with history of CAD, HTN, HLD, sinus bradycardia, bicuspid aortic valve with mild AS, OSA, thoracic aortic aneurysm, CKD stage III who is being seen today by virtual e-visit due to the [place] pandemic. He was admitted with unstable angina in September 2012 and was found to have severe stenosis in the mid to distal LAD treated with a drug eluting stent. He was also found to have moderate mid LAD and distal RCA disease. In December 2017 he was found to have a branch retinal artery occlusion in his right eye. Echo November 2017 with LVEF=55-60%, mild AS, mildly dilated aortic root. Carotid dopplers with mild disease. Event monitor in January 2018 showed atrial fibrillation. He was in atrial fibrillation at time of office visit here in January 2018. He was started on Xarelto January 2018.   He tells me today that he feels great No chest pain, dyspnea or palpitations. No LE edema  The  patient does not have symptoms concerning for COVID-19 infection (fever, chills, cough, or new shortness of breath).    Past Medical History:  Diagnosis Date   Anemia    Bicuspid aortic valve    CAD (coronary artery disease)    proximal LAD 50%, mid LAD 50-60%, mid to distal LAD 99%; mid circumflex 20%; proximal RCA 20%, distal RCA 50%; PDA 60-70%, proximal posterior AV groove 50%, proximal posterior lateral 50%.  There was a question of possible LVOT gradient;  s/p Resolute DES to mid-distal LAD 10/2010;  echocardiogram 11/17/10: EF 55-60%, mild LVH, grade 1 diastolic dysfunction, normal aortic valve, mild MR, PASP 32.    Chicken pox    CKD (chronic kidney disease), stage III ([name])    Enlarged aorta ([name])    Glaucoma    Gout    allopurinol 300mg , colchicine prn in past, has not had flares   Hypercholesterolemia    With hypertriglyceridemia   Hypertension    Kidney stones    hx hematuria as result. cystosopy normal   Mild aortic stenosis    PAF (paroxysmal atrial fibrillation) ([place])    a. Documented on event monitor 01/2016.   Premature atrial contractions    Sleep apnea    cpap nightly   Past Surgical History:  Procedure Laterality Date   CORONARY STENT PLACEMENT  2012   CYSTOSCOPY/RETROGRADE/URETEROSCOPY  03/26/2012   Procedure: CYSTOSCOPY/RETROGRADE/URETEROSCOPY;  Surgeon: [name], MD;  [facility];  Service: Urology;  Laterality:  Bilateral;  CYSTOSCOPY BILATERAL RETROGRADE PYELOGRAM AND POSSIBLE URETEROSCOPY     Current Meds  Medication Sig   allopurinol (ZYLOPRIM) 300 MG tablet Take 1 tablet (300 mg total) by mouth daily.   amLODipine (NORVASC) 5 MG tablet Take 1 tablet (5 mg total) by mouth daily.   atorvastatin (LIPITOR) 40 MG tablet Take 1 tablet (40 mg total) by mouth daily.   calcitRIOL (ROCALTROL) 0.25 MCG capsule Take 1 capsule by mouth daily.   dorzolamide-timolol (COSOPT) 22.3-6.8 MG/ML ophthalmic solution Place  1 drop into both eyes 2 (two) times daily.   lisinopril (PRINIVIL,ZESTRIL) 20 MG tablet Take 1 tablet (20 mg total) by mouth daily.   Tafluprost (ZIOPTAN) 0.0015 % SOLN Apply to eye. One drop in both eyes at night   XARELTO 20 MG TABS tablet TAKE 1 TABLET BY MOUTH DAILY WITH SUPPER     Allergies:   Patient has no known allergies.   Social History   Tobacco Use   Smoking status: Never Smoker   Smokeless tobacco: Never Used  Substance Use Topics   Alcohol use: No   Drug use: No     Family Hx: The patient's family history includes CAD in his father; Diabetes in his paternal grandmother; Hypertension in his father; Kidney disease in his father; Valvular heart disease in his mother. There is no history of Colon cancer.  ROS:   Please see the history of present illness.    All other systems reviewed and are negative.   Prior CV studies:   The following studies were reviewed today:  Cardiac cath 11/17/10: 1. Left main coronary artery: The vessel is normal in size and free  of significant disease.  2. Left anterior descending artery: The vessel is normal in size with  disease noted throughout its course. Proximally, just before the  first septal branch, there is 50% stenosis. In the mid segment  right after second diagonal, there is another 50%-60% eccentric  lesion. In the mid-to-distal LAD before the third diagonal, there  is a 99% tubular stenosis. First diagonal is overall small in size  and free of significant disease. Second diagonal is normal in size  and free of significant disease. Third diagonal is very small in  size.  3. Left circumflex artery: The vessel is normal in size but  nondominant. It has minor irregularities without obstructive  disease. There is a 20% lesion in the mid segment right after OM- 1.  4. Right coronary artery: The vessel is very large in size and  dominant. There is a 20% proximal stenosis. There is minor  irregularities in the mid  segment. Distally, right at PDA and the  AV groove bifurcation, there is a 50% stenosis. The PDA itself is  medium in size and diffusely diseased throughout its course with  60%-70% disease noted with plaque formation. The distal area is  too small. The posterior AV groove has a 50% proximal lesion and  it gives a large posterolateral branch which has a 50% proximal  stenosis. The second posterolateral branch is small in size and  free of significant disease.  Echo 01/13/17: Left ventricle: The cavity size was normal. Wall thickness was increased in a pattern of mild LVH. Systolic function was normal. The estimated ejection fraction was in the range of 55% to 60%. Wall motion was normal; there were no regional wall motion abnormalities. Left ventricular diastolic function parameters were normal. - Aortic valve: Trileaflet; moderately calcified leaflets. There was mild stenosis. Mean gradient (S):  12 mm Hg. Peak gradient (S): 25 mm Hg. Valve area (VTI): 1.75 cm^2. - Aorta: Mildly dilated aortic root and ascending aorta. Aortic root dimension: 37 mm (ED). Ascending aortic diameter: 39 mm (S). - Mitral valve: There was trivial regurgitation. - Right ventricle: The cavity size was normal. Systolic function was normal. - Pulmonary arteries: PA peak pressure: 30 mm Hg (S). - Inferior vena cava: The vessel was normal in size. The respirophasic diameter changes were in the normal range (>= 50%), consistent with normal central venous pressure.  Impressions:  - Normal LV size with mild LV hypertrophy. EF 55-60%. Normal diastolic function. Normal RV size and systolic function. Mild aortic stenosis.  Labs/Other Tests and Data Reviewed:    EKG:  No ECG reviewed.  Recent Labs: 04/24/2018: BUN 33; Creatinine 2.3; Hemoglobin 15.9; Potassium 3.9; Sodium 142   Recent Lipid Panel Lab Results  Component Value Date/Time   CHOL 133 06/06/2017 08:04 AM   TRIG 207  (H) 06/06/2017 08:04 AM   HDL 34 (L) 06/06/2017 08:04 AM   CHOLHDL 3.9 06/06/2017 08:04 AM   CHOLHDL 3 03/21/2016 07:59 AM   LDLCALC 58 06/06/2017 08:04 AM   LDLDIRECT 70.9 02/10/2012 07:37 AM    Wt Readings from Last 3 Encounters:  07/05/18 230 lb (104.3 kg)  12/14/17 239 lb (108.4 kg)  05/30/17 252 lb 12.8 oz (114.7 kg)     Objective:    Vital Signs:  Ht 5\' 8"  (1.727 m)    Wt 230 lb (104.3 kg)    BMI 34.97 kg/m    VITAL SIGNS:  reviewed GEN:  no acute distress  ASSESSMENT & PLAN:    1. CAD without angina: No chest pain. Will continue statin. No ASA since he is also on Xarelto. No beta blocker due to bradycardia.    2. HTN: BP is well controlled at home  3. HLD: LDL at goal in April 2019. Continue statin.     4. Atrial fibrillation, persistent: No palpitations. Will continue Xarelto. He is not on any rate controlling medications.    5. Aortic stenosis: Mild AS by echo November 2017. Repeat echo November 2020  6. Carotid artery disease: He is known to have mild bilateral carotid disease by dopplers 2017. Will repeat post Covid19 pandemic.   COVID-19 Education: The signs and symptoms of COVID-19 were discussed with the patient and how to seek care for testing (follow up with PCP or arrange E-visit).  The importance of social distancing was discussed today.  Time:   Today, I have spent 9minutes with the patient with telehealth technology discussing the above problems.     Medication Adjustments/Labs and Tests Ordered: Current medicines are reviewed at length with the patient today.  Concerns regarding medicines are outlined above.   Tests Ordered: No orders of the defined types were placed in this encounter.   Medication Changes: No orders of the defined types were placed in this encounter.   Disposition:  Follow up in 6 month(s)  Signed, Lauree Chandler, MD  07/06/2018 9:17 AM    [place]

## 2018-07-05 ENCOUNTER — Other Ambulatory Visit: Payer: Self-pay

## 2018-07-05 ENCOUNTER — Telehealth (INDEPENDENT_AMBULATORY_CARE_PROVIDER_SITE_OTHER): Payer: BLUE CROSS/BLUE SHIELD | Admitting: Cardiovascular Disease

## 2018-07-05 ENCOUNTER — Encounter: Payer: Self-pay | Admitting: Cardiovascular Disease

## 2018-07-05 VITALS — Ht 68.0 in | Wt 230.0 lb

## 2018-07-05 DIAGNOSIS — I350 Nonrheumatic aortic (valve) stenosis: Secondary | ICD-10-CM | POA: Diagnosis not present

## 2018-07-05 DIAGNOSIS — I480 Paroxysmal atrial fibrillation: Secondary | ICD-10-CM

## 2018-07-05 DIAGNOSIS — I2510 Atherosclerotic heart disease of native coronary artery without angina pectoris: Secondary | ICD-10-CM

## 2018-07-05 DIAGNOSIS — E782 Mixed hyperlipidemia: Secondary | ICD-10-CM

## 2018-07-05 DIAGNOSIS — I10 Essential (primary) hypertension: Secondary | ICD-10-CM | POA: Diagnosis not present

## 2018-07-05 DIAGNOSIS — I6523 Occlusion and stenosis of bilateral carotid arteries: Secondary | ICD-10-CM

## 2018-07-05 NOTE — Patient Instructions (Signed)

## 2018-07-06 ENCOUNTER — Ambulatory Visit: Payer: BLUE CROSS/BLUE SHIELD | Admitting: Cardiovascular Disease

## 2018-07-25 DIAGNOSIS — H35033 Hypertensive retinopathy, bilateral: Secondary | ICD-10-CM | POA: Diagnosis not present

## 2018-07-25 DIAGNOSIS — H34231 Retinal artery branch occlusion, right eye: Secondary | ICD-10-CM | POA: Diagnosis not present

## 2018-08-21 DIAGNOSIS — N183 Chronic kidney disease, stage 3 (moderate): Secondary | ICD-10-CM | POA: Diagnosis not present

## 2018-08-21 DIAGNOSIS — N2581 Secondary hyperparathyroidism of renal origin: Secondary | ICD-10-CM | POA: Diagnosis not present

## 2018-08-21 DIAGNOSIS — D631 Anemia in chronic kidney disease: Secondary | ICD-10-CM | POA: Diagnosis not present

## 2018-08-21 DIAGNOSIS — I129 Hypertensive chronic kidney disease with stage 1 through stage 4 chronic kidney disease, or unspecified chronic kidney disease: Secondary | ICD-10-CM | POA: Diagnosis not present

## 2018-08-21 DIAGNOSIS — N189 Chronic kidney disease, unspecified: Secondary | ICD-10-CM | POA: Diagnosis not present

## 2018-09-03 ENCOUNTER — Other Ambulatory Visit: Payer: Self-pay | Admitting: Family Medicine

## 2018-09-23 ENCOUNTER — Other Ambulatory Visit: Payer: Self-pay | Admitting: Family Medicine

## 2018-10-02 DIAGNOSIS — Z20828 Contact with and (suspected) exposure to other viral communicable diseases: Secondary | ICD-10-CM | POA: Diagnosis not present

## 2018-10-23 NOTE — Patient Instructions (Addendum)
Health Maintenance Due  Topic Date Due  . Hepatitis C Screening- today with labs  1960-08-21  . TETANUS/TDAP Done today 03/01/2018   Please stop by lab before you go If you do not have mychart- we will call you about results within 5 business days of [place] receiving them.  If you have mychart- we will send your results within 3 business days of [place] receiving them.  If abnormal or we want to clarify a result, we will call or mychart you to make sure you receive the message.  If you have questions or concerns or don't hear within 5-7 days, please send [place] a message or call us.   1-2 month blood pressure recheck- please bring home cuff with you and perhaps blood pressures for a week before visit.   Please let us know if worsening swelling in legs or shortness of breath  Sorry again about the net app job

## 2018-10-23 NOTE — Progress Notes (Signed)
Phone: [phone]   Subjective:  Patient presents today for their annual physical. Chief complaint-noted.   See problem oriented charting- ROS- full  review of systems was completed and negative except for: leg swelling  The following were reviewed and entered/updated in epic: Past Medical History:  Diagnosis Date  . Anemia   . Bicuspid aortic valve   . CAD (coronary artery disease)    proximal LAD 50%, mid LAD 50-60%, mid to distal LAD 99%; mid circumflex 20%; proximal RCA 20%, distal RCA 50%; PDA 60-70%, proximal posterior AV groove 50%, proximal posterior lateral 50%.  There was a question of possible LVOT gradient;  s/p Resolute DES to mid-distal LAD 10/2010;  echocardiogram 11/17/10: EF 55-60%, mild LVH, grade 1 diastolic dysfunction, normal aortic valve, mild MR, PASP 32.   . Chicken pox   . CKD (chronic kidney disease), stage III ([place])   . Enlarged aorta ([place])   . Glaucoma   . Gout    allopurinol 300mg , colchicine prn in past, has not had flares  . Hypercholesterolemia    With hypertriglyceridemia  . Hypertension   . Kidney stones    hx hematuria as result. cystosopy normal  . Mild aortic stenosis   . PAF (paroxysmal atrial fibrillation) ([place])    a. Documented on event monitor 01/2016.  [name] Premature atrial contractions   . Sleep apnea    cpap nightly   Patient Active Problem List   Diagnosis Date Noted  . Paroxysmal atrial fibrillation ([place]) 03/02/2016    Priority: High  . Branch retinal artery occlusion 03/01/2016    Priority: High  . CAD (coronary artery disease) 12/06/2010    Priority: High  . Mild aortic stenosis 08/11/2014    Priority: Medium  . Gout     Priority: Medium  . OSA (obstructive sleep apnea) 12/06/2010    Priority: Medium  . CKD (chronic kidney disease), stage III ([place]) 12/06/2010    Priority: Medium  . Mixed hyperlipidemia 12/06/2010    Priority: Medium  . HTN (hypertension) 12/06/2010    Priority: Medium  . Bradycardia 12/06/2010   Priority: Medium  . Carotid bruit 02/09/2012    Priority: Low  . Dilated aortic root ([place]) 12/14/2017  . Morbid obesity ([place]) 10/04/2016   Past Surgical History:  Procedure Laterality Date  . CORONARY STENT PLACEMENT  2012  . CYSTOSCOPY/RETROGRADE/URETEROSCOPY  03/26/2012   Procedure: CYSTOSCOPY/RETROGRADE/URETEROSCOPY;  Surgeon: [name], MD;  Location: [facility];  Service: Urology;  Laterality: Bilateral;  CYSTOSCOPY BILATERAL RETROGRADE PYELOGRAM AND POSSIBLE URETEROSCOPY    Family History  Problem Relation Age of Onset  . Hypertension Father   . CAD Father        CABG 9s  . Kidney disease Father        Stage IV  . Valvular heart disease Mother        AVR July 2015  . Diabetes Paternal Grandmother   . Colon cancer Neg Hx     Medications- reviewed and updated Current Outpatient Medications  Medication Sig Dispense Refill  . allopurinol (ZYLOPRIM) 300 MG tablet TAKE 1 TABLET BY MOUTH EVERY DAY 90 tablet 2  . amLODipine (NORVASC) 5 MG tablet Take 1 tablet (5 mg total) by mouth daily. 90 tablet 2  . atorvastatin (LIPITOR) 40 MG tablet TAKE 1 TABLET BY MOUTH EVERY DAY 90 tablet 2  . calcitRIOL (ROCALTROL) 0.25 MCG capsule Take 1 capsule by mouth daily.    . dorzolamide-timolol (COSOPT) 22.3-6.8 MG/ML ophthalmic solution Place 1  drop into both eyes 2 (two) times daily.    [name] lisinopril (ZESTRIL) 20 MG tablet Take 1 tablet (20 mg total) by mouth daily. 90 tablet 2  . sodium bicarbonate 650 MG tablet Take 650 mg by mouth 4 (four) times daily.    . Tafluprost (ZIOPTAN) 0.0015 % SOLN Apply to eye. One drop in both eyes at night    . XARELTO 20 MG TABS tablet TAKE 1 TABLET BY MOUTH DAILY WITH SUPPER 90 tablet 1   No current facility-administered medications for this visit.     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 Oropharynx normal Neck: no thyromegaly CV: regular rate. 3/6 SEM. No murmurs rubs or gallops Lungs: CTAB no crackles, wheeze, rhonchi Abdomen: soft/nontender/nondistended/normal bowel sounds. No rebound or guarding.  Ext: 2+ edema left leg, 1+ on right. No calf pain today (had pain last week) Skin: warm, dry Neuro: grossly normal, moves all extremities, PERRLA  My bp recheck 132 over 92 and 138/96.    Assessment and Plan  58 y.o. male presenting for annual physical.  Health Maintenance counseling: 1. Anticipatory guidance: Patient counseled regarding regular dental exams -q6 months, eye exams - q3 months for glaucoma,  avoiding smoking and second hand smoke , limiting alcohol to 2 beverages per day .   2. Risk factor reduction:  Advised patient of need for regular exercise and diet rich and fruits and vegetables to reduce risk of heart attack and stroke. Exercise- started walking again recently- had been doing good until pandemic- has been harder with pandemic. Diet-worsened with pandemic but recently improving again and trying to get back on weight loss pattern. Morbid obesity with BMI over 35 with htn, hld. Has lost 10 lbs since 05/2017.  Gained weight with pandemic- 10 lbs Wt Readings from Last 3 Encounters:  10/24/18 242 lb (109.8 kg)  07/05/18 230 lb (104.3 kg)  12/14/17 239 lb (108.4 kg)  3. Immunizations/screenings/ancillary studies- declines flu. Updated tdap today .  Offered hcv screen- opted in Immunization History  Administered Date(s) Administered  . Td 03/01/2008  . Tdap 10/24/2018   4.  Prostate cancer screening- trend psa. Defer rectal given low risk prior psa trend.   Lab Results  Component Value Date   PSA 0.88 03/21/2016   PSA 0.97 03/19/2015   5. Colon cancer screening - 10/16 with 10 year follow up 6. Skin cancer screening- no dermatologist. advised regular sunscreen use. Denies worrisome, changing, or new skin lesions.  7. never smoker  Status of chronic or acute concerns   # laid off from [employer] yesterday. At least has a severance- most folks from his project were laid off fortunately  Gout - Taking Allopurinol 300 mg daily.   Hyperlipidemia/CAD - Taking Atorvastatin 40 mg daily. Also on asprin. Saw cardiology for video visit  07/05/2018 Dr. Angelena Form.   Hypertension - Taking Lisinopril 20 mg daily and Amlodipine 5 mg daily typically- did run out of lisinopril recently and did not take this morning- needs refill.  - BP poor contorl today- will do 1-2 month recheck (he doesn't trust his home cuff as has been high so we will check that t next visit  - some edema starting a few weeks ago (does take amlodipine). Denies increased salt. Was worse last weke- better this week. Left leg does seem to be worse .  -walking does seem to help at least.  -better this week -will let us know if worsens -ultrasound if d dimer positive   CKD - Followed by Dr. Justin Mend. Stable recently. Avoid nsaids.   A Fib - Taking Xarelto 20 mg daily. Followed by cardiology. No rate control medicine.   Aortic stenosis- mild by echo nov 2017- November 2020 planned echocardiogram.   Dilated aortic root- will be followed in November on echo - was only mildly dilated.   Recommended follow up: 6 month follow up in general but 1-2 month bp rechekc   Lab/Order associations:fasting    ICD-10-CM   1. Encounter for general adult medical examination with abnormal findings  Z00.01 CBC    Comprehensive metabolic panel    Lipid panel    PSA    D-Dimer, Quantitative    Uric acid    Hepatitis C antibody   2. Paroxysmal atrial fibrillation ([name])  I48.0   3. CKD (chronic kidney disease), stage III ([name])  N18.3   4. Dilated aortic root ([name])  I77.810   5. Morbid obesity ([place])  E66.01   6. Coronary artery disease involving native coronary artery of native heart without angina pectoris  I25.10   7. Mild aortic stenosis  I35.0   8. Chronic gout without tophus, unspecified cause, unspecified site  M1A.9XX0 Uric acid  9. Mixed hyperlipidemia  E78.2 CBC    Comprehensive metabolic panel    Lipid panel  10. Essential hypertension  I10   11. Pain of left calf  M79.662 D-Dimer, Quantitative  12. Screening for prostate cancer  Z12.5 PSA  13. Encounter for hepatitis C screening test for low risk patient  Z11.59 Hepatitis C antibody  14. Need for Tdap vaccination  Z23 Tdap vaccine greater than or equal to 7yo IM    Meds ordered this encounter  Medications  . lisinopril (ZESTRIL) 20 MG tablet    Sig: Take 1 tablet (20 mg total) by mouth daily.    Dispense:  90 tablet    Refill:  2    Return precautions advised.  Garret Reddish, MD

## 2018-10-24 ENCOUNTER — Encounter: Payer: Self-pay | Admitting: Family Medicine

## 2018-10-24 ENCOUNTER — Other Ambulatory Visit: Payer: Self-pay

## 2018-10-24 ENCOUNTER — Ambulatory Visit (INDEPENDENT_AMBULATORY_CARE_PROVIDER_SITE_OTHER): Payer: BC Managed Care – PPO | Admitting: Family Medicine

## 2018-10-24 VITALS — BP 128/90 | HR 84 | Temp 98.3°F | Ht 68.0 in | Wt 242.0 lb

## 2018-10-24 DIAGNOSIS — I480 Paroxysmal atrial fibrillation: Secondary | ICD-10-CM | POA: Diagnosis not present

## 2018-10-24 DIAGNOSIS — Z125 Encounter for screening for malignant neoplasm of prostate: Secondary | ICD-10-CM | POA: Diagnosis not present

## 2018-10-24 DIAGNOSIS — E782 Mixed hyperlipidemia: Secondary | ICD-10-CM

## 2018-10-24 DIAGNOSIS — M79662 Pain in left lower leg: Secondary | ICD-10-CM

## 2018-10-24 DIAGNOSIS — Z0001 Encounter for general adult medical examination with abnormal findings: Secondary | ICD-10-CM | POA: Diagnosis not present

## 2018-10-24 DIAGNOSIS — I2510 Atherosclerotic heart disease of native coronary artery without angina pectoris: Secondary | ICD-10-CM

## 2018-10-24 DIAGNOSIS — Z23 Encounter for immunization: Secondary | ICD-10-CM

## 2018-10-24 DIAGNOSIS — N183 Chronic kidney disease, stage 3 unspecified: Secondary | ICD-10-CM

## 2018-10-24 DIAGNOSIS — I350 Nonrheumatic aortic (valve) stenosis: Secondary | ICD-10-CM

## 2018-10-24 DIAGNOSIS — I77810 Thoracic aortic ectasia: Secondary | ICD-10-CM | POA: Diagnosis not present

## 2018-10-24 DIAGNOSIS — I10 Essential (primary) hypertension: Secondary | ICD-10-CM

## 2018-10-24 DIAGNOSIS — Z1159 Encounter for screening for other viral diseases: Secondary | ICD-10-CM | POA: Diagnosis not present

## 2018-10-24 DIAGNOSIS — M1A9XX0 Chronic gout, unspecified, without tophus (tophi): Secondary | ICD-10-CM

## 2018-10-24 LAB — COMPREHENSIVE METABOLIC PANEL
ALT: 38 U/L (ref 0–53)
AST: 20 U/L (ref 0–37)
Albumin: 4 g/dL (ref 3.5–5.2)
Alkaline Phosphatase: 107 U/L (ref 39–117)
BUN: 37 mg/dL — ABNORMAL HIGH (ref 6–23)
CO2: 25 mEq/L (ref 19–32)
Calcium: 8.8 mg/dL (ref 8.4–10.5)
Chloride: 108 mEq/L (ref 96–112)
Creatinine, Ser: 2.78 mg/dL — ABNORMAL HIGH (ref 0.40–1.50)
GFR: 23.61 mL/min — ABNORMAL LOW (ref >60.00)
Glucose, Bld: 88 mg/dL (ref 70–99)
Potassium: 3.6 mEq/L (ref 3.5–5.1)
Sodium: 143 mEq/L (ref 135–145)
Total Bilirubin: 1 mg/dL (ref 0.2–1.2)
Total Protein: 6.6 g/dL (ref 6.0–8.3)

## 2018-10-24 LAB — LIPID PANEL
Cholesterol: 124 mg/dL (ref 0–200)
HDL: 37.7 mg/dL — ABNORMAL LOW (ref >39.00)
LDL Cholesterol: 49 mg/dL (ref 0–99)
NonHDL: 86.4
Total CHOL/HDL Ratio: 3
Triglycerides: 187 mg/dL — ABNORMAL HIGH (ref 0.0–149.0)
VLDL: 37.4 mg/dL (ref 0.0–40.0)

## 2018-10-24 LAB — TSH: TSH: 2.18 u[IU]/mL (ref 0.35–4.50)

## 2018-10-24 LAB — CBC
HCT: 45.9 % (ref 39.0–52.0)
Hemoglobin: 15.4 g/dL (ref 13.0–17.0)
MCHC: 33.5 g/dL (ref 30.0–36.0)
MCV: 98.9 fl (ref 78.0–100.0)
Platelets: 153 10*3/uL (ref 150.0–400.0)
RBC: 4.64 Mil/uL (ref 4.22–5.81)
RDW: 14.5 % (ref 11.5–15.5)
WBC: 5.8 10*3/uL (ref 4.0–10.5)

## 2018-10-24 LAB — URIC ACID: Uric Acid, Serum: 5.2 mg/dL (ref 4.0–7.8)

## 2018-10-24 LAB — PSA: PSA: 0.92 ng/mL (ref 0.10–4.00)

## 2018-10-24 MED ORDER — LISINOPRIL 20 MG PO TABS: 20.0000 mg | ORAL_TABLET | Freq: Every day | ORAL | 2 refills | Status: DC

## 2018-10-25 DIAGNOSIS — H401130 Primary open-angle glaucoma, bilateral, stage unspecified: Secondary | ICD-10-CM | POA: Diagnosis not present

## 2018-10-25 LAB — HEPATITIS C ANTIBODY
Hepatitis C Ab: NONREACTIVE
SIGNAL TO CUT-OFF: 0.03 (ref <1.00)

## 2018-10-25 LAB — D-DIMER, QUANTITATIVE: D-Dimer, Quant: 0.31 mcg/mL FEU (ref <0.50)

## 2018-11-13 DIAGNOSIS — G4733 Obstructive sleep apnea (adult) (pediatric): Secondary | ICD-10-CM | POA: Diagnosis not present

## 2018-11-19 ENCOUNTER — Other Ambulatory Visit: Payer: Self-pay | Admitting: Cardiovascular Disease

## 2018-11-19 MED ORDER — RIVAROXABAN 15 MG PO TABS: 15.0000 mg | ORAL_TABLET | Freq: Every day | ORAL | 0 refills | Status: DC

## 2018-11-19 NOTE — Telephone Encounter (Signed)
63m 109.8kg Scr 2.78 10/24/18 Lovw/mcalhany 07/05/18 ccr 45.62ml/min Pt is requesting 20mg  xarelto but only qualifies for 15mg  based on ccr needs a clinician review for a possible dose change

## 2018-11-19 NOTE — Telephone Encounter (Signed)
Approved for 15mg  xarelto via Marcelle Overlie.  Called and spoke w/pt regarding dose change and the pt voiced understanding. Sent rx for the 15mg  to pharmacy and refused the 20mg 

## 2018-11-25 ENCOUNTER — Other Ambulatory Visit: Payer: Self-pay | Admitting: Family Medicine

## 2018-12-28 ENCOUNTER — Ambulatory Visit (INDEPENDENT_AMBULATORY_CARE_PROVIDER_SITE_OTHER): Payer: BC Managed Care – PPO | Admitting: Family Medicine

## 2018-12-28 ENCOUNTER — Encounter: Payer: Self-pay | Admitting: Family Medicine

## 2018-12-28 VITALS — BP 136/82 | HR 91 | Ht 68.0 in | Wt 236.0 lb

## 2018-12-28 DIAGNOSIS — I10 Essential (primary) hypertension: Secondary | ICD-10-CM | POA: Diagnosis not present

## 2018-12-28 NOTE — Patient Instructions (Signed)
There are no preventive care reminders to display for this patient.

## 2018-12-28 NOTE — Progress Notes (Signed)
Phone [phone]  Subjective:  Virtual visit via Video note. Chief complaint: Chief Complaint  Patient presents with  . Hypertension     This visit type was conducted due to national recommendations for restrictions regarding the COVID-19 Pandemic (e.g. social distancing).  This format is felt to be most appropriate for this patient at this time balancing risks to patient and risks to population by having him in for in person visit.  No physical exam was performed (except for noted visual exam or audio findings with Telehealth visits).    Our team/I connected with Daniel Solomon at  9:40 AM EDT by a video enabled telemedicine application (doxy.me or caregility through epic) and verified that I am speaking with the correct person using two identifiers.  Location patient: Home[facility] Location provider: [facility], office Persons participating in the virtual visit:  patient  Our team/I discussed the limitations of evaluation and management by telemedicine and the availability of in person appointments. In light of current covid-19 pandemic, patient also understands that we are trying to protect them by minimizing in office contact if at all possible.  The patient expressed consent for telemedicine visit and agreed to proceed. Patient understands insurance will be billed.   ROS- Review of Systems  Constitutional: Negative for chills and fever.  HENT: Negative.   Eyes: Negative.   Respiratory: Positive for cough.        On going for several weeks in morning only.   Cardiovascular: Negative.   Gastrointestinal: Negative.   Genitourinary: Negative.   Musculoskeletal: Negative.   Skin: Negative for itching and rash.  Neurological: Negative.   Endo/Heme/Allergies: Negative.   Psychiatric/Behavioral: Negative.   cough worse after being out and mowing grass. States over 3 weeks and not worsening. No other covid 19 symptoms-No fever, chills,congestion, runny nose, shortness of breath, fatigue,  body aches, sore throat, headache, nausea, vomiting, diarrhea, or new loss of taste or smell. No known contacts with covid 19 or someone being tested for covid 19.   Past Medical History-  Patient Active Problem List   Diagnosis Date Noted  . Paroxysmal atrial fibrillation ([place]) 03/02/2016    Priority: High  . Branch retinal artery occlusion 03/01/2016    Priority: High  . CAD (coronary artery disease) 12/06/2010    Priority: High  . Mild aortic stenosis 08/11/2014    Priority: Medium  . Gout     Priority: Medium  . OSA (obstructive sleep apnea) 12/06/2010    Priority: Medium  . CKD (chronic kidney disease), stage III ([place]) 12/06/2010    Priority: Medium  . Mixed hyperlipidemia 12/06/2010    Priority: Medium  . HTN (hypertension) 12/06/2010    Priority: Medium  . Bradycardia 12/06/2010    Priority: Medium  . Carotid bruit 02/09/2012    Priority: Low  . Dilated aortic root ([place]) 12/14/2017  . Morbid obesity ([place]) 10/04/2016    Medications- reviewed and updated Current Outpatient Medications  Medication Sig Dispense Refill  . allopurinol (ZYLOPRIM) 300 MG tablet TAKE 1 TABLET BY MOUTH EVERY DAY 90 tablet 2  . amLODipine (NORVASC) 5 MG tablet TAKE 1 TABLET BY MOUTH EVERY DAY 90 tablet 1  . atorvastatin (LIPITOR) 40 MG tablet TAKE 1 TABLET BY MOUTH EVERY DAY 90 tablet 2  . calcitRIOL (ROCALTROL) 0.25 MCG capsule Take 1 capsule by mouth daily.    . dorzolamide-timolol (COSOPT) 22.3-6.8 MG/ML ophthalmic solution Place 1 drop into both eyes 2 (two) times daily.    [name] lisinopril (ZESTRIL) 20  MG tablet Take 1 tablet (20 mg total) by mouth daily. 90 tablet 2  . Rivaroxaban (XARELTO) 15 MG TABS tablet Take 1 tablet (15 mg total) by mouth daily with supper. 90 tablet 0  . sodium bicarbonate 650 MG tablet Take 650 mg by mouth 4 (four) times daily.    . Tafluprost (ZIOPTAN) 0.0015 % SOLN Apply to eye. One drop in both eyes at night     No current facility-administered medications for  this visit.      Objective:  BP 136/82   Ht 5\' 8"  (1.727 m)   Wt 236 lb (107 kg)   BMI 35.88 kg/m  self reported vitals Gen: NAD, resting comfortably Lungs: nonlabored, normal respiratory rate  Skin: appears dry, no obvious rash     Assessment and Plan   #hypertension S: compliant with lisinopril 20mg , amlodipine 5 mg. Last visit diastolic was slightly high and he did not trust his home readings as had been far higher than readings he had at multiple medical offices (here and [place] [facility]) BP Readings from Last 3 Encounters:  12/28/18 136/82. Compared to home readings 150s/100s  10/24/18 128/90  12/14/17 128/86  Review: taking medications as instructed, no medication side effects noted, no TIAs, no chest pain on exertion, no dyspnea on exertion, no swelling of ankles. Smoker: No. A/P: Good control - continue current medicines. Home cuff appears inaccurate.   Recommended follow up: 6 month BP recheck/follow up reasonable though we did not specifically discuss  Lab/Order associations:   ICD-10-CM   1. Essential hypertension  I10    Return precautions advised.  Garret Reddish, MD

## 2019-02-12 ENCOUNTER — Other Ambulatory Visit: Payer: Self-pay | Admitting: Cardiovascular Disease

## 2019-02-12 NOTE — Telephone Encounter (Signed)
63m 107kg Scr 2.78 10/24/18 ccr 44.4 Lovw/mcalhaney 07/05/18

## 2019-03-01 HISTORY — PX: HERNIA REPAIR: SHX51

## 2019-04-11 ENCOUNTER — Other Ambulatory Visit: Payer: Self-pay | Admitting: Cardiovascular Disease

## 2019-04-11 NOTE — Telephone Encounter (Signed)
Pt last saw Dr Angelena Form 07/05/18 telemedicine Covid-19, last labs 10/24/18 Creat 2.78, age 59, weight 107kg, CrCl 43.83, based on CrCl pt is on appropriate dosage of Xarelto 15mg  QD.  Will refill rx.

## 2019-04-23 DIAGNOSIS — N2581 Secondary hyperparathyroidism of renal origin: Secondary | ICD-10-CM | POA: Diagnosis not present

## 2019-04-23 DIAGNOSIS — N189 Chronic kidney disease, unspecified: Secondary | ICD-10-CM | POA: Diagnosis not present

## 2019-04-23 DIAGNOSIS — M109 Gout, unspecified: Secondary | ICD-10-CM | POA: Diagnosis not present

## 2019-04-23 DIAGNOSIS — I129 Hypertensive chronic kidney disease with stage 1 through stage 4 chronic kidney disease, or unspecified chronic kidney disease: Secondary | ICD-10-CM | POA: Diagnosis not present

## 2019-04-23 DIAGNOSIS — D631 Anemia in chronic kidney disease: Secondary | ICD-10-CM | POA: Diagnosis not present

## 2019-04-23 DIAGNOSIS — N183 Chronic kidney disease, stage 3 unspecified: Secondary | ICD-10-CM | POA: Diagnosis not present

## 2019-04-26 ENCOUNTER — Other Ambulatory Visit: Payer: Self-pay | Admitting: Nephrology

## 2019-04-26 DIAGNOSIS — N183 Chronic kidney disease, stage 3 unspecified: Secondary | ICD-10-CM

## 2019-04-30 ENCOUNTER — Ambulatory Visit
Admission: RE | Admit: 2019-04-30 | Discharge: 2019-04-30 | Disposition: A | Discharge status: Home / Self Care | Payer: BC Managed Care – PPO | Source: Ambulatory Visit | Attending: Nephrology | Admitting: Nephrology

## 2019-04-30 DIAGNOSIS — N200 Calculus of kidney: Secondary | ICD-10-CM | POA: Diagnosis not present

## 2019-04-30 DIAGNOSIS — N183 Chronic kidney disease, stage 3 unspecified: Secondary | ICD-10-CM

## 2019-05-07 DIAGNOSIS — N183 Chronic kidney disease, stage 3 unspecified: Secondary | ICD-10-CM | POA: Diagnosis not present

## 2019-05-07 DIAGNOSIS — I129 Hypertensive chronic kidney disease with stage 1 through stage 4 chronic kidney disease, or unspecified chronic kidney disease: Secondary | ICD-10-CM | POA: Diagnosis not present

## 2019-05-21 DIAGNOSIS — I129 Hypertensive chronic kidney disease with stage 1 through stage 4 chronic kidney disease, or unspecified chronic kidney disease: Secondary | ICD-10-CM | POA: Diagnosis not present

## 2019-05-21 DIAGNOSIS — N184 Chronic kidney disease, stage 4 (severe): Secondary | ICD-10-CM | POA: Diagnosis not present

## 2019-05-21 DIAGNOSIS — N2581 Secondary hyperparathyroidism of renal origin: Secondary | ICD-10-CM | POA: Diagnosis not present

## 2019-05-21 DIAGNOSIS — Q613 Polycystic kidney, unspecified: Secondary | ICD-10-CM | POA: Diagnosis not present

## 2019-05-27 ENCOUNTER — Other Ambulatory Visit: Payer: Self-pay | Admitting: Family Medicine

## 2019-06-05 DIAGNOSIS — N281 Cyst of kidney, acquired: Secondary | ICD-10-CM | POA: Diagnosis not present

## 2019-06-05 DIAGNOSIS — N200 Calculus of kidney: Secondary | ICD-10-CM | POA: Diagnosis not present

## 2019-06-05 DIAGNOSIS — D49511 Neoplasm of unspecified behavior of right kidney: Secondary | ICD-10-CM | POA: Diagnosis not present

## 2019-06-10 ENCOUNTER — Ambulatory Visit: Payer: BC Managed Care – PPO | Admitting: Cardiovascular Disease

## 2019-06-13 ENCOUNTER — Other Ambulatory Visit: Payer: Self-pay | Admitting: Urology

## 2019-06-13 DIAGNOSIS — D49511 Neoplasm of unspecified behavior of right kidney: Secondary | ICD-10-CM

## 2019-06-13 DIAGNOSIS — Z77018 Contact with and (suspected) exposure to other hazardous metals: Secondary | ICD-10-CM

## 2019-06-14 ENCOUNTER — Other Ambulatory Visit: Payer: Self-pay

## 2019-06-14 ENCOUNTER — Other Ambulatory Visit: Payer: Self-pay | Admitting: Family Medicine

## 2019-06-14 ENCOUNTER — Encounter: Payer: Self-pay | Admitting: Cardiovascular Disease

## 2019-06-14 ENCOUNTER — Ambulatory Visit: Payer: BC Managed Care – PPO | Admitting: Cardiovascular Disease

## 2019-06-14 VITALS — BP 124/80 | HR 64 | Ht 68.0 in | Wt 251.0 lb

## 2019-06-14 DIAGNOSIS — I10 Essential (primary) hypertension: Secondary | ICD-10-CM | POA: Diagnosis not present

## 2019-06-14 DIAGNOSIS — E782 Mixed hyperlipidemia: Secondary | ICD-10-CM

## 2019-06-14 DIAGNOSIS — I2510 Atherosclerotic heart disease of native coronary artery without angina pectoris: Secondary | ICD-10-CM | POA: Diagnosis not present

## 2019-06-14 DIAGNOSIS — I350 Nonrheumatic aortic (valve) stenosis: Secondary | ICD-10-CM | POA: Diagnosis not present

## 2019-06-14 DIAGNOSIS — I4819 Other persistent atrial fibrillation: Secondary | ICD-10-CM

## 2019-06-14 DIAGNOSIS — I6523 Occlusion and stenosis of bilateral carotid arteries: Secondary | ICD-10-CM

## 2019-06-14 NOTE — Patient Instructions (Signed)
Medication Instructions:  No changes *If you need a refill on your cardiac medications before your next appointment, please call your pharmacy*   Lab Work: none If you have labs (blood work) drawn today and your tests are completely normal, you will receive your results only by: . MyChart Message (if you have MyChart) OR . A paper copy in the mail If you have any lab test that is abnormal or we need to change your treatment, we will call you to review the results.   Testing/Procedures: Your physician has requested that you have an echocardiogram. Echocardiography is a painless test that uses sound waves to create images of your heart. It provides your doctor with information about the size and shape of your heart and how well your heart's chambers and valves are working. This procedure takes approximately one hour. There are no restrictions for this procedure.   Follow-Up: At [facility], you and your health needs are our priority.  As part of our continuing mission to provide you with exceptional heart care, we have created designated Provider Care Teams.  These Care Teams include your primary Cardiologist (physician) and Advanced Practice Providers (APPs -  Physician Assistants and Nurse Practitioners) who all work together to provide you with the care you need, when you need it.   Your next appointment:   12 month(s)  The format for your next appointment:   In Person  Provider:   You may see Christopher McAlhany, MD or one of the following Advanced Practice Providers on your designated Care Team:    Dayna Dunn, PA-C  Michele Lenze, PA-C   Other Instructions   

## 2019-06-14 NOTE — Progress Notes (Signed)
Chief Complaint  Patient presents with  . Follow-up    CAD   History of Present Illness: 59 yo male with history of CAD, HTN, HLD, sinus bradycardia, bicuspid aortic valve with mild AS, OSA, thoracic aortic aneurysm, CKD stage III here today for cardiac follow up. He was admitted with unstable angina in September 2012 and was found to have severe stenosis in the mid to distal LAD treated with a 2.25 x 18 mm Resolute DES. He was also found to have moderate mid LAD and distal RCA disease. In December 2017 he was found to have a branch retinal artery occlusion in his right eye. Echo November 2017 with LVEF=55-60%, mild AS, mildly dilated aortic root. Carotid dopplers with mild disease. Event monitor showed atrial fibrillation. He was in atrial fibrillation at time of office visit here in January 2018. He was started on Xarelto January 2018.   He is here today for follow up. The patient denies any chest pain, dyspnea, palpitations, lower extremity edema, orthopnea, PND, dizziness, near syncope or syncope.   Primary Care Physician: Marin Olp, MD   Past Medical History:  Diagnosis Date  . Anemia   . Bicuspid aortic valve   . CAD (coronary artery disease)    proximal LAD 50%, mid LAD 50-60%, mid to distal LAD 99%; mid circumflex 20%; proximal RCA 20%, distal RCA 50%; PDA 60-70%, proximal posterior AV groove 50%, proximal posterior lateral 50%.  There was a question of possible LVOT gradient;  s/p Resolute DES to mid-distal LAD 10/2010;  echocardiogram 11/17/10: EF 55-60%, mild LVH, grade 1 diastolic dysfunction, normal aortic valve, mild MR, PASP 32.   . Chicken pox   . CKD (chronic kidney disease), stage III   . 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 Glaucoma   . Gout    allopurinol 300mg , colchicine prn in past, has not had flares  . Hypercholesterolemia    With hypertriglyceridemia  . Hypertension   . Kidney stones    hx hematuria as result. cystosopy normal  . Mild aortic stenosis   . PAF  (paroxysmal atrial fibrillation) ([place])    a. Documented on event monitor 01/2016.  [name] Premature atrial contractions   . Sleep apnea    cpap nightly    Past Surgical History:  Procedure Laterality Date  . CORONARY STENT PLACEMENT  2012  . CYSTOSCOPY/RETROGRADE/URETEROSCOPY  03/26/2012   Procedure: CYSTOSCOPY/RETROGRADE/URETEROSCOPY;  Surgeon: [name], MD;  Location: [facility];  Service: Urology;  Laterality: Bilateral;  CYSTOSCOPY BILATERAL RETROGRADE PYELOGRAM AND POSSIBLE URETEROSCOPY    Current Outpatient Medications  Medication Sig Dispense Refill  . allopurinol (ZYLOPRIM) 300 MG tablet TAKE 1 TABLET BY MOUTH EVERY DAY 90 tablet 2  . amLODipine (NORVASC) 5 MG tablet TAKE 1 TABLET BY MOUTH EVERY DAY 90 tablet 1  . atorvastatin (LIPITOR) 40 MG tablet TAKE 1 TABLET BY MOUTH EVERY DAY 90 tablet 2  . calcitRIOL (ROCALTROL) 0.25 MCG capsule Take 1 capsule by mouth daily.    . dorzolamide-timolol (COSOPT) 22.3-6.8 MG/ML ophthalmic solution Place 1 drop into both eyes 2 (two) times daily.    [name] labetalol (NORMODYNE) 200 MG tablet Take 200 mg by mouth 2 (two) times daily.    . sodium bicarbonate 650 MG tablet Take 650 mg by mouth 4 (four) times daily.    . Tafluprost (ZIOPTAN) 0.0015 % SOLN Apply to eye. One drop in both eyes at night    . XARELTO 15 MG TABS tablet TAKE 1  TABLET BY MOUTH EVERY DAY WITH SUPPER 30 tablet 5   No current facility-administered medications for this visit.    No Known Allergies  Social History   Socioeconomic History  . Marital status: Married    Spouse name: Not on file  . Number of children: 4  . Years of education: Not on file  . Highest education level: Not on file  Occupational History  . Occupation: Lawyer: [employer]  . Smoking status: Never Smoker  . Smokeless tobacco: Never Used  Substance and Sexual Activity  . Alcohol use: No  . Drug use: No  . Sexual activity: Not on file  Other  Topics Concern  . 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 Difficulty of Paying Living Expenses:   Food Insecurity:   . Worried About Charity fundraiser in the Last Year:   . Arboriculturist in the Last Year:   Transportation Needs:   . Film/video editor (Medical):   [name] Lack of Transportation (Non-Medical):   Physical Activity:   . Days of Exercise per Week:   . Minutes of Exercise per Session:   Stress:   . Feeling of Stress :   Social Connections:   . Frequency of Communication with Friends and Family:   . Frequency of Social Gatherings with Friends and Family:   . Attends Religious Services:   . Active Member of Clubs or Organizations:   . Attends Archivist Meetings:   [name] Marital Status:   Intimate Partner Violence:   . Fear of Current or Ex-Partner:   . Emotionally Abused:   [name] Physically Abused:   . Sexually Abused:     Family History  Problem Relation Age of Onset  . Hypertension Father   . CAD Father        CABG 76s  . Kidney disease Father        Stage IV  . Valvular heart disease Mother        AVR July 2015  . Diabetes Paternal Grandmother   . Colon cancer Neg Hx     Review of Systems:  As stated in the HPI and otherwise negative.   BP 124/80   Pulse 64   Ht 5\' 8"  (1.727 m)   Wt 251 lb (113.9 kg)   SpO2 98%   BMI 38.16 kg/m   Physical Examination: General: Well developed, well nourished, NAD  HEENT: OP clear, mucus membranes moist  SKIN: warm, dry. No rashes. Neuro: No focal deficits  Musculoskeletal: Muscle strength 5/5 all ext  Psychiatric: Mood and affect normal  Neck: No JVD, no  carotid bruits, no thyromegaly, no lymphadenopathy.  Lungs:Clear bilaterally, no wheezes, rhonci, crackles Cardiovascular: Regular rate and rhythm. Systolic murmur noted.  Abdomen:Soft. Bowel sounds present. Non-tender.  Extremities: No lower extremity edema. Pulses are 2 + in the bilateral DP/PT.  Cardiac cath 11/17/10: 1. Left main coronary artery: The vessel is normal in size and free  of significant disease.  2.  Left anterior descending artery: The vessel is normal in size with  disease noted throughout its course. Proximally, just before the  first septal branch, there is 50% stenosis. In the mid segment  right after second diagonal, there is another 50%-60% eccentric  lesion. In the mid-to-distal LAD before the third diagonal, there  is a 99% tubular stenosis. First diagonal is overall small in size  and free of significant disease. Second diagonal is normal in size  and free of significant disease. Third diagonal is very small in  size.  3. Left circumflex artery: The vessel is normal in size but  nondominant. It has minor irregularities without obstructive  disease. There is a 20% lesion in the mid segment right after OM- 1.  4. Right coronary artery: The vessel is very large in size and  dominant. There is a 20% proximal stenosis. There is minor  irregularities in the mid segment. Distally, right at PDA and the  AV groove bifurcation, there is a 50% stenosis. The PDA itself is  medium in size and diffusely diseased throughout its course with  60%-70% disease noted with plaque formation. The distal area is  too small. The posterior AV groove has a 50% proximal lesion and  it gives a large posterolateral branch which has a 50% proximal  stenosis. The second posterolateral branch is small in size and  free of significant disease.  Echo 01/14/16: Left ventricle: The cavity size was normal. Wall thickness was   increased in a pattern of mild LVH. Systolic function was normal.    The estimated ejection fraction was in the range of 55% to 60%.   Wall motion was normal; there were no regional wall motion   abnormalities. Left ventricular diastolic function parameters   were normal. - Aortic valve: Trileaflet; moderately calcified leaflets. There   was mild stenosis. Mean gradient (S): 12 mm Hg. Peak gradient   (S): 25 mm Hg. Valve area (VTI): 1.75 cm^2. - Aorta: Mildly dilated aortic root and ascending aorta. Aortic   root dimension: 37 mm (ED). Ascending aortic diameter: 39 mm (S). - Mitral valve: There was trivial regurgitation. - Right ventricle: The cavity size was normal. Systolic function   was normal. - Pulmonary arteries: PA peak pressure: 30 mm Hg (S). - Inferior vena cava: The vessel was normal in size. The   respirophasic diameter changes were in the normal range (>= 50%),   consistent with normal central venous pressure.  Impressions:  - Normal LV size with mild LV hypertrophy. EF 55-60%. Normal   diastolic function. Normal RV size and systolic function. Mild   aortic stenosis.  EKG:  EKG is ordered today. The ekg ordered today demonstrates Atrial fib, rate 64 bpm  Recent Labs: 10/24/2018: ALT 38; BUN 37; Creatinine, Ser 2.78; Hemoglobin 15.4; Platelets 153.0; Potassium 3.6; Sodium 143; TSH 2.18   Lipid Panel    Component Value Date/Time   CHOL 124 10/24/2018 1014   CHOL 133 06/06/2017 0804   TRIG 187.0 (H) 10/24/2018 1014   HDL 37.70 (L) 10/24/2018 1014   HDL 34 (L) 06/06/2017 0804   CHOLHDL 3 10/24/2018 1014   VLDL 37.4 10/24/2018 1014   LDLCALC 49 10/24/2018 1014   LDLCALC 58 06/06/2017 0804   LDLDIRECT 70.9 02/10/2012 0737     Wt Readings from Last 3 Encounters:  06/14/19 251 lb (113.9 kg)  12/28/18 236 lb (107 kg)  10/24/18 242 lb (109.8 kg)     Other studies Reviewed: Additional studies/ records  that were reviewed today include: . Review of the above records demonstrates:   Assessment and Plan:   1. CAD without angina:  No chest pain. No beta blocker with bradycardia. Continue statin. No ASA since he is on Xarelto.   2. HTN: BP is controlled. Continue current therapy  3. HLD: LDL 49 in August 2020 in primary care.. Continue statin.     4. Atrial fibrillation, persistent: Atrial fib today. Continue Xarelto (reduced dose due to renal function).   5. Aortic stenosis: He likely as a bicuspid AV. Mild AS by echo in 2017. Repeat echo now.   6. Carotid artery disease: He is known to have mild bilateral carotid disease by dopplers 2017. Repeat at next visit.   Current medicines are reviewed at length with the patient today.  The patient does not have concerns regarding medicines.  The following changes have been made:  no change  Labs/ tests ordered today include:   Orders Placed This Encounter  Procedures  . EKG 12-Lead  . ECHOCARDIOGRAM COMPLETE     Disposition:   FU with me in 12 months   Signed, Lauree Chandler, MD 06/14/2019 4:10 PM    [facility] [address], [place], [place]  [address] Phone: [phone]; Fax: [phone]

## 2019-06-20 DIAGNOSIS — D631 Anemia in chronic kidney disease: Secondary | ICD-10-CM | POA: Diagnosis not present

## 2019-06-20 DIAGNOSIS — N189 Chronic kidney disease, unspecified: Secondary | ICD-10-CM | POA: Diagnosis not present

## 2019-06-20 DIAGNOSIS — N184 Chronic kidney disease, stage 4 (severe): Secondary | ICD-10-CM | POA: Diagnosis not present

## 2019-06-20 DIAGNOSIS — I129 Hypertensive chronic kidney disease with stage 1 through stage 4 chronic kidney disease, or unspecified chronic kidney disease: Secondary | ICD-10-CM | POA: Diagnosis not present

## 2019-06-20 DIAGNOSIS — N2581 Secondary hyperparathyroidism of renal origin: Secondary | ICD-10-CM | POA: Diagnosis not present

## 2019-07-01 ENCOUNTER — Ambulatory Visit (HOSPITAL_COMMUNITY): Payer: BC Managed Care – PPO | Attending: Internal Medicine

## 2019-07-01 ENCOUNTER — Other Ambulatory Visit: Payer: Self-pay

## 2019-07-01 DIAGNOSIS — E782 Mixed hyperlipidemia: Secondary | ICD-10-CM

## 2019-07-01 DIAGNOSIS — I2510 Atherosclerotic heart disease of native coronary artery without angina pectoris: Secondary | ICD-10-CM

## 2019-07-01 DIAGNOSIS — I6523 Occlusion and stenosis of bilateral carotid arteries: Secondary | ICD-10-CM | POA: Diagnosis not present

## 2019-07-01 DIAGNOSIS — I4819 Other persistent atrial fibrillation: Secondary | ICD-10-CM | POA: Diagnosis not present

## 2019-07-01 DIAGNOSIS — I350 Nonrheumatic aortic (valve) stenosis: Secondary | ICD-10-CM

## 2019-07-01 DIAGNOSIS — I10 Essential (primary) hypertension: Secondary | ICD-10-CM | POA: Diagnosis not present

## 2019-07-04 ENCOUNTER — Other Ambulatory Visit: Payer: Self-pay | Admitting: *Deleted

## 2019-07-04 DIAGNOSIS — N186 End stage renal disease: Secondary | ICD-10-CM

## 2019-07-05 DIAGNOSIS — N184 Chronic kidney disease, stage 4 (severe): Secondary | ICD-10-CM | POA: Diagnosis not present

## 2019-07-08 ENCOUNTER — Other Ambulatory Visit (HOSPITAL_COMMUNITY): Payer: BC Managed Care – PPO

## 2019-07-08 ENCOUNTER — Encounter: Payer: BC Managed Care – PPO | Admitting: Surgery

## 2019-07-08 ENCOUNTER — Encounter (HOSPITAL_COMMUNITY): Payer: BC Managed Care – PPO

## 2019-07-09 ENCOUNTER — Other Ambulatory Visit: Payer: Self-pay

## 2019-07-09 ENCOUNTER — Ambulatory Visit
Admission: RE | Admit: 2019-07-09 | Discharge: 2019-07-09 | Disposition: A | Discharge status: Home / Self Care | Payer: BC Managed Care – PPO | Source: Ambulatory Visit | Attending: Urology | Admitting: Urology

## 2019-07-09 DIAGNOSIS — Z01818 Encounter for other preprocedural examination: Secondary | ICD-10-CM | POA: Diagnosis not present

## 2019-07-09 DIAGNOSIS — Z77018 Contact with and (suspected) exposure to other hazardous metals: Secondary | ICD-10-CM

## 2019-07-09 DIAGNOSIS — N281 Cyst of kidney, acquired: Secondary | ICD-10-CM | POA: Diagnosis not present

## 2019-07-09 DIAGNOSIS — D49511 Neoplasm of unspecified behavior of right kidney: Secondary | ICD-10-CM

## 2019-07-09 MED ORDER — GADOBENATE DIMEGLUMINE 529 MG/ML IV SOLN
20.0000 mL | Freq: Once | INTRAVENOUS | Status: AC | PRN
  Administered 2019-07-09: 20 mL via INTRAVENOUS

## 2019-07-16 DIAGNOSIS — D49511 Neoplasm of unspecified behavior of right kidney: Secondary | ICD-10-CM | POA: Diagnosis not present

## 2019-08-01 ENCOUNTER — Encounter: Payer: Self-pay | Admitting: Surgery

## 2019-08-21 DIAGNOSIS — H2513 Age-related nuclear cataract, bilateral: Secondary | ICD-10-CM | POA: Diagnosis not present

## 2019-08-21 DIAGNOSIS — H524 Presbyopia: Secondary | ICD-10-CM | POA: Diagnosis not present

## 2019-08-21 DIAGNOSIS — H40023 Open angle with borderline findings, high risk, bilateral: Secondary | ICD-10-CM | POA: Diagnosis not present

## 2019-08-21 DIAGNOSIS — H349 Unspecified retinal vascular occlusion: Secondary | ICD-10-CM | POA: Diagnosis not present

## 2019-09-20 DIAGNOSIS — I129 Hypertensive chronic kidney disease with stage 1 through stage 4 chronic kidney disease, or unspecified chronic kidney disease: Secondary | ICD-10-CM | POA: Diagnosis not present

## 2019-09-20 DIAGNOSIS — N2581 Secondary hyperparathyroidism of renal origin: Secondary | ICD-10-CM | POA: Diagnosis not present

## 2019-09-20 DIAGNOSIS — I4891 Unspecified atrial fibrillation: Secondary | ICD-10-CM | POA: Diagnosis not present

## 2019-09-20 DIAGNOSIS — N184 Chronic kidney disease, stage 4 (severe): Secondary | ICD-10-CM | POA: Diagnosis not present

## 2019-09-20 DIAGNOSIS — N189 Chronic kidney disease, unspecified: Secondary | ICD-10-CM | POA: Diagnosis not present

## 2019-09-24 DIAGNOSIS — H40013 Open angle with borderline findings, low risk, bilateral: Secondary | ICD-10-CM | POA: Diagnosis not present

## 2019-11-02 ENCOUNTER — Other Ambulatory Visit: Payer: Self-pay | Admitting: Cardiovascular Disease

## 2019-11-02 DIAGNOSIS — I480 Paroxysmal atrial fibrillation: Secondary | ICD-10-CM

## 2019-11-05 NOTE — Addendum Note (Signed)
Addended by: Johny Shock B on: 11/05/2019 10:00 AM   Modules accepted: Orders

## 2019-11-05 NOTE — Telephone Encounter (Signed)
Prescription refill request for Xarelto received.   Last office visit: McAlhany, 06/14/2019 Weight: 113.9 kg  Age: 59 y.o. Scr: 2.78, 10/24/2018 CrCl: 46 ml/min   Pt is overdue for blood work. Pt is to come in on 9/17 for blood work, appointment scheduled. Will go ahead and send in for a 1 month supply so that pt does not run out.

## 2019-11-09 ENCOUNTER — Other Ambulatory Visit: Payer: Self-pay | Admitting: Family Medicine

## 2019-11-11 DIAGNOSIS — N189 Chronic kidney disease, unspecified: Secondary | ICD-10-CM | POA: Diagnosis not present

## 2019-11-11 DIAGNOSIS — I4891 Unspecified atrial fibrillation: Secondary | ICD-10-CM | POA: Diagnosis not present

## 2019-11-11 DIAGNOSIS — N2581 Secondary hyperparathyroidism of renal origin: Secondary | ICD-10-CM | POA: Diagnosis not present

## 2019-11-11 DIAGNOSIS — N184 Chronic kidney disease, stage 4 (severe): Secondary | ICD-10-CM | POA: Diagnosis not present

## 2019-11-11 DIAGNOSIS — I129 Hypertensive chronic kidney disease with stage 1 through stage 4 chronic kidney disease, or unspecified chronic kidney disease: Secondary | ICD-10-CM | POA: Diagnosis not present

## 2019-11-15 ENCOUNTER — Other Ambulatory Visit: Payer: BC Managed Care – PPO | Admitting: *Deleted

## 2019-11-15 ENCOUNTER — Other Ambulatory Visit: Payer: Self-pay

## 2019-11-15 DIAGNOSIS — I480 Paroxysmal atrial fibrillation: Secondary | ICD-10-CM

## 2019-11-15 LAB — BASIC METABOLIC PANEL
BUN/Creatinine Ratio: 9 (ref 9–20)
BUN: 47 mg/dL — ABNORMAL HIGH (ref 6–24)
CO2: 22 mmol/L (ref 20–29)
Calcium: 8.7 mg/dL (ref 8.7–10.2)
Chloride: 106 mmol/L (ref 96–106)
Creatinine, Ser: 5.31 mg/dL — ABNORMAL HIGH (ref 0.76–1.27)
GFR calc Af Amer: 13 mL/min/{1.73_m2} — ABNORMAL LOW (ref >59)
GFR calc non Af Amer: 11 mL/min/{1.73_m2} — ABNORMAL LOW (ref >59)
Glucose: 87 mg/dL (ref 65–99)
Potassium: 3.9 mmol/L (ref 3.5–5.2)
Sodium: 142 mmol/L (ref 134–144)

## 2019-11-15 LAB — CBC
Hematocrit: 36.8 % — ABNORMAL LOW (ref 37.5–51.0)
Hemoglobin: 12.6 g/dL — ABNORMAL LOW (ref 13.0–17.7)
MCH: 33.8 pg — ABNORMAL HIGH (ref 26.6–33.0)
MCHC: 34.2 g/dL (ref 31.5–35.7)
MCV: 99 fL — ABNORMAL HIGH (ref 79–97)
Platelets: 144 10*3/uL — ABNORMAL LOW (ref 150–450)
RBC: 3.73 x10E6/uL — ABNORMAL LOW (ref 4.14–5.80)
RDW: 13.7 % (ref 11.6–15.4)
WBC: 5.8 10*3/uL (ref 3.4–10.8)

## 2019-11-16 ENCOUNTER — Other Ambulatory Visit: Payer: Self-pay | Admitting: Family Medicine

## 2019-11-18 ENCOUNTER — Telehealth: Payer: Self-pay | Admitting: Pharmacist

## 2019-11-18 MED ORDER — APIXABAN 2.5 MG PO TABS: 2.5000 mg | ORAL_TABLET | Freq: Two times a day (BID) | ORAL | 5 refills | Status: DC

## 2019-11-18 NOTE — Telephone Encounter (Signed)
I spoke with patient. He has an apt with his nephrologist in 3 weeks. His Scr back in March was 4.18. appears he is also being evaluated for a kidney transplant. Discussed switching to Eliquis due to its better safety profile in patients with CKD 4-5. He is in agreement to switch. I have called Rx [facility]. Pt aware to stop Xarelto and start Eliquis. I tried to sign pt up for copay card but it said duplicate records found. Pt may already have one.

## 2019-12-02 ENCOUNTER — Other Ambulatory Visit: Payer: Self-pay | Admitting: Cardiovascular Disease

## 2019-12-23 DIAGNOSIS — N2581 Secondary hyperparathyroidism of renal origin: Secondary | ICD-10-CM | POA: Diagnosis not present

## 2019-12-23 DIAGNOSIS — I4891 Unspecified atrial fibrillation: Secondary | ICD-10-CM | POA: Diagnosis not present

## 2019-12-23 DIAGNOSIS — N189 Chronic kidney disease, unspecified: Secondary | ICD-10-CM | POA: Diagnosis not present

## 2019-12-23 DIAGNOSIS — I129 Hypertensive chronic kidney disease with stage 1 through stage 4 chronic kidney disease, or unspecified chronic kidney disease: Secondary | ICD-10-CM | POA: Diagnosis not present

## 2019-12-23 DIAGNOSIS — N184 Chronic kidney disease, stage 4 (severe): Secondary | ICD-10-CM | POA: Diagnosis not present

## 2019-12-23 LAB — BASIC METABOLIC PANEL
BUN: 66 — AB (ref 4–21)
CO2: 21 (ref 13–22)
Chloride: 109 — AB (ref 99–108)
Creatinine: 6 — AB (ref 0.6–1.3)
Glucose: 94
Potassium: 4 (ref 3.4–5.3)
Sodium: 143 (ref 137–147)

## 2019-12-23 LAB — COMPREHENSIVE METABOLIC PANEL
Albumin: 4.1 (ref 3.5–5.0)
Calcium: 8.6 — AB (ref 8.7–10.7)
GFR calc Af Amer: 11
GFR calc non Af Amer: 9

## 2019-12-23 LAB — CBC: RBC: 3.54 — AB (ref 3.87–5.11)

## 2019-12-23 LAB — CBC AND DIFFERENTIAL
HCT: 34 — AB (ref 41–53)
Hemoglobin: 11.6 — AB (ref 13.5–17.5)
Neutrophils Absolute: 3.6
Platelets: 144 — AB (ref 150–399)
WBC: 5.9

## 2019-12-24 ENCOUNTER — Other Ambulatory Visit: Payer: Self-pay | Admitting: Family Medicine

## 2020-01-22 DIAGNOSIS — N184 Chronic kidney disease, stage 4 (severe): Secondary | ICD-10-CM | POA: Diagnosis not present

## 2020-01-29 DIAGNOSIS — M109 Gout, unspecified: Secondary | ICD-10-CM | POA: Diagnosis not present

## 2020-01-29 DIAGNOSIS — N2581 Secondary hyperparathyroidism of renal origin: Secondary | ICD-10-CM | POA: Diagnosis not present

## 2020-01-29 DIAGNOSIS — I129 Hypertensive chronic kidney disease with stage 1 through stage 4 chronic kidney disease, or unspecified chronic kidney disease: Secondary | ICD-10-CM | POA: Diagnosis not present

## 2020-01-29 DIAGNOSIS — N189 Chronic kidney disease, unspecified: Secondary | ICD-10-CM | POA: Diagnosis not present

## 2020-01-29 DIAGNOSIS — N184 Chronic kidney disease, stage 4 (severe): Secondary | ICD-10-CM | POA: Diagnosis not present

## 2020-01-29 LAB — BASIC METABOLIC PANEL
BUN: 75 — AB (ref 4–21)
CO2: 22 (ref 13–22)
Chloride: 110 — AB (ref 99–108)
Creatinine: 8.1 — AB (ref 0.6–1.3)
Glucose: 109
Potassium: 4.7 (ref 3.4–5.3)
Sodium: 143 (ref 137–147)

## 2020-01-29 LAB — CBC AND DIFFERENTIAL
HCT: 32 — AB (ref 41–53)
Hemoglobin: 10.7 — AB (ref 13.5–17.5)
Neutrophils Absolute: 3.4
Platelets: 136 — AB (ref 150–399)
WBC: 5.2

## 2020-01-29 LAB — COMPREHENSIVE METABOLIC PANEL
Albumin: 4.1 (ref 3.5–5.0)
Calcium: 8.5 — AB (ref 8.7–10.7)
GFR calc Af Amer: 8
GFR calc non Af Amer: 7

## 2020-01-29 LAB — CBC: RBC: 3.28 — AB (ref 3.87–5.11)

## 2020-02-13 ENCOUNTER — Telehealth: Payer: Self-pay | Admitting: *Deleted

## 2020-02-13 DIAGNOSIS — I480 Paroxysmal atrial fibrillation: Secondary | ICD-10-CM | POA: Diagnosis not present

## 2020-02-13 DIAGNOSIS — I2510 Atherosclerotic heart disease of native coronary artery without angina pectoris: Secondary | ICD-10-CM | POA: Diagnosis not present

## 2020-02-13 DIAGNOSIS — N189 Chronic kidney disease, unspecified: Secondary | ICD-10-CM | POA: Diagnosis not present

## 2020-02-13 DIAGNOSIS — M109 Gout, unspecified: Secondary | ICD-10-CM | POA: Diagnosis not present

## 2020-02-13 DIAGNOSIS — N184 Chronic kidney disease, stage 4 (severe): Secondary | ICD-10-CM | POA: Diagnosis not present

## 2020-02-13 DIAGNOSIS — N2581 Secondary hyperparathyroidism of renal origin: Secondary | ICD-10-CM | POA: Diagnosis not present

## 2020-02-13 DIAGNOSIS — I129 Hypertensive chronic kidney disease with stage 1 through stage 4 chronic kidney disease, or unspecified chronic kidney disease: Secondary | ICD-10-CM | POA: Diagnosis not present

## 2020-02-13 DIAGNOSIS — Z4902 Encounter for fitting and adjustment of peritoneal dialysis catheter: Secondary | ICD-10-CM | POA: Diagnosis not present

## 2020-02-13 DIAGNOSIS — I10 Essential (primary) hypertension: Secondary | ICD-10-CM | POA: Diagnosis not present

## 2020-02-13 NOTE — Telephone Encounter (Signed)
Patient with diagnosis of afib on Eliquis for anticoagulation.    Procedure: LAPAROSCOPIC PERITONEAL DIALYSIS CATHETER, OMENTOPEXY   Date of procedure: 03/06/20    CHA2DS2-VASc Score = 2  This indicates a 2.2% annual risk of stroke. The patient's score is based upon: CHF History: No HTN History: Yes Diabetes History: No Stroke History: No Vascular Disease History: Yes Age Score: 0 Gender Score: 0      CrCl 12 ml/min  Patient has hx of a retinal artery occlusion of right eye in 2017.  Would classify surgery as low to mod bleed risk.  I will see if Dr. Angelena Form is ok with a 2 day hold.

## 2020-02-13 NOTE — Telephone Encounter (Signed)
   [facility] Pre-operative Risk Assessment    HEARTCARE STAFF: - Please ensure there is not already an duplicate clearance open for this procedure. - Under Visit Info/Reason for Call, type in Other and utilize the format Clearance MM/DD/YY or Clearance TBD. Do not use dashes or single digits. - If request is for dental extraction, please clarify the # of teeth to be extracted.  Request for surgical clearance:  1. What type of surgery is being performed? LAPAROSCOPIC PERITONEAL DIALYSIS CATHETER, OMENTOPEXY    2. When is this surgery scheduled? 03/06/20   3. What type of clearance is required (medical clearance vs. Pharmacy clearance to hold med vs. Both)? BOTH  4. Are there any medications that need to be held prior to surgery and how long? ELIQUIS   5. Practice name and name of physician performing surgery? [facility]; SURGEON NOT LISTED   6. What is the office phone number? [phone]   7.   What is the office fax number? [phone]  8.   Anesthesia type (None, local, MAC, general) ? NOT LISTED   Julaine Hua 02/13/2020, 3:32 PM  _________________________________________________________________   (provider comments below)

## 2020-02-13 NOTE — Telephone Encounter (Signed)
Ok to hold Eliquis for two days prior to procedure. Daniel Solomon

## 2020-02-16 NOTE — Telephone Encounter (Signed)
Left message for the patient to call back and speak to the on-call preop APP of the day 

## 2020-02-17 NOTE — Telephone Encounter (Signed)
   Primary Cardiologist: Lauree Chandler, MD  Chart reviewed as part of pre-operative protocol coverage. Given past medical history and time since last visit, based on ACC/AHA guidelines, DERRION TRITZ would be at acceptable risk for the planned procedure without further cardiovascular testing.   Patient with diagnosis of afib on Eliquis for anticoagulation.    Procedure: LAPAROSCOPIC PERITONEAL DIALYSIS CATHETER, OMENTOPEXY Date of procedure: 03/06/20    CHA2DS2-VASc Score = 2  This indicates a 2.2% annual risk of stroke. The patient's score is based upon: CHF History: No HTN History: Yes Diabetes History: No Stroke History: No Vascular Disease History: Yes Age Score: 0 Gender Score: 0   CrCl 12 ml/min  Patient has hx of a retinal artery occlusion of right eye in 2017.  Would classify surgery as low to mod bleed risk.   He may hold his Eliquis for 2 days prior to his procedure.  Please resume as soon as hemostasis is achieved.  Patient was advised that if he develops new symptoms prior to surgery to contact our office to arrange a follow-up appointment.  He verbalized understanding.  I will route this recommendation to the requesting party via Epic fax function and remove from pre-op pool.  Please call with questions.  Jossie Ng. Debbe Crumble NP-C    02/17/2020, 8:44 AM [place] [address] Office [phone] Fax [phone]

## 2020-02-17 NOTE — Telephone Encounter (Signed)
Patient returning call. Please contact him at [phone].

## 2020-02-17 NOTE — Telephone Encounter (Signed)
Left voice message to call back 02/17/2020 at 804 a.m.

## 2020-03-18 ENCOUNTER — Other Ambulatory Visit: Payer: Self-pay

## 2020-03-18 DIAGNOSIS — N186 End stage renal disease: Secondary | ICD-10-CM

## 2020-03-20 DIAGNOSIS — N189 Chronic kidney disease, unspecified: Secondary | ICD-10-CM | POA: Insufficient documentation

## 2020-03-20 DIAGNOSIS — Z79899 Other long term (current) drug therapy: Secondary | ICD-10-CM | POA: Insufficient documentation

## 2020-03-20 DIAGNOSIS — D509 Iron deficiency anemia, unspecified: Secondary | ICD-10-CM | POA: Insufficient documentation

## 2020-03-20 DIAGNOSIS — E878 Other disorders of electrolyte and fluid balance, not elsewhere classified: Secondary | ICD-10-CM | POA: Insufficient documentation

## 2020-03-20 DIAGNOSIS — R809 Proteinuria, unspecified: Secondary | ICD-10-CM | POA: Insufficient documentation

## 2020-03-20 DIAGNOSIS — R601 Generalized edema: Secondary | ICD-10-CM | POA: Insufficient documentation

## 2020-03-20 DIAGNOSIS — D631 Anemia in chronic kidney disease: Secondary | ICD-10-CM

## 2020-03-20 DIAGNOSIS — N041 Nephrotic syndrome with focal and segmental glomerular lesions: Secondary | ICD-10-CM | POA: Insufficient documentation

## 2020-03-20 DIAGNOSIS — R82998 Other abnormal findings in urine: Secondary | ICD-10-CM | POA: Insufficient documentation

## 2020-03-20 DIAGNOSIS — Z4932 Encounter for adequacy testing for peritoneal dialysis: Secondary | ICD-10-CM | POA: Insufficient documentation

## 2020-03-20 DIAGNOSIS — Z87442 Personal history of urinary calculi: Secondary | ICD-10-CM | POA: Insufficient documentation

## 2020-03-20 DIAGNOSIS — T7840XS Allergy, unspecified, sequela: Secondary | ICD-10-CM | POA: Insufficient documentation

## 2020-03-20 DIAGNOSIS — D689 Coagulation defect, unspecified: Secondary | ICD-10-CM | POA: Insufficient documentation

## 2020-03-20 DIAGNOSIS — N2581 Secondary hyperparathyroidism of renal origin: Secondary | ICD-10-CM | POA: Insufficient documentation

## 2020-03-20 DIAGNOSIS — E8770 Fluid overload, unspecified: Secondary | ICD-10-CM | POA: Insufficient documentation

## 2020-03-20 DIAGNOSIS — R52 Pain, unspecified: Secondary | ICD-10-CM | POA: Insufficient documentation

## 2020-03-20 DIAGNOSIS — K7689 Other specified diseases of liver: Secondary | ICD-10-CM | POA: Insufficient documentation

## 2020-03-20 DIAGNOSIS — N186 End stage renal disease: Secondary | ICD-10-CM | POA: Insufficient documentation

## 2020-03-20 DIAGNOSIS — Z992 Dependence on renal dialysis: Secondary | ICD-10-CM | POA: Insufficient documentation

## 2020-03-20 HISTORY — DX: Anemia in chronic kidney disease: N18.9

## 2020-03-20 HISTORY — DX: Anemia in chronic kidney disease: D63.1

## 2020-04-01 ENCOUNTER — Telehealth: Payer: Self-pay | Admitting: Cardiovascular Disease

## 2020-04-01 NOTE — Telephone Encounter (Signed)
Pt c/o medication issue:  1. Name of Medication: apixaban (ELIQUIS) 2.5 MG TABS tablet  2. How are you currently taking this medication (dosage and times per day)? 1 tablet twice a day  3. Are you having a reaction (difficulty breathing--STAT)? no  4. What is your medication issue? Patient states the medication requires a prior authorization.

## 2020-04-02 NOTE — Telephone Encounter (Signed)
PA complete. Came back as no PA needed. Called pharmacy who was able to process Rx. Cost $25. Tried to activate copay card but says duplicate records. Gave pt the # for Eliquis [phone] to get his copay card information. Pt appreciative of the help

## 2020-04-09 ENCOUNTER — Other Ambulatory Visit: Payer: Self-pay

## 2020-04-09 DIAGNOSIS — N186 End stage renal disease: Secondary | ICD-10-CM

## 2020-04-10 ENCOUNTER — Encounter (HOSPITAL_COMMUNITY): Payer: BC Managed Care – PPO

## 2020-04-10 ENCOUNTER — Other Ambulatory Visit (HOSPITAL_COMMUNITY): Payer: BC Managed Care – PPO

## 2020-04-10 ENCOUNTER — Encounter: Payer: BC Managed Care – PPO | Admitting: Vascular Surgery

## 2020-05-14 ENCOUNTER — Telehealth: Payer: Self-pay | Admitting: Cardiovascular Disease

## 2020-05-14 NOTE — Telephone Encounter (Signed)
Spoke with patient. He started peritoneal dialysis in Jan.  Since then he said he has become hypotensive.  The kidney doctor decreased his labetalol from 200 mg BID to 100 mg BID.  He still takes Norvasc 5 mg daily. BP in am coming off the dialysis machine in the am is 120/80 and then he takes his meds and it drops into 90s/.  Aware I would forward to Dr. Angelena Form and we will call hm back with further recommendations.

## 2020-05-14 NOTE — Telephone Encounter (Signed)
Patient calling to speak with Dr. Angelena Form but advised he was not in today. He would like to speak with his nurse but did not want me to take a message.

## 2020-05-18 NOTE — Telephone Encounter (Signed)
Pt advised to hold his Norvasc and will continue to monitor and let us know if he has any further problems.

## 2020-05-18 NOTE — Telephone Encounter (Signed)
I would have him stop the Norvasc. Thanks, chris

## 2020-05-26 ENCOUNTER — Other Ambulatory Visit: Payer: Self-pay | Admitting: Cardiovascular Disease

## 2020-05-26 NOTE — Telephone Encounter (Signed)
Pt last saw Dr Angelena Form 06/14/19, last labs 03/06/20 Creat 9.89, age 60, weight 113.9kg, based on specified criteria pt is not on appropriate dosage.  However per Dr Camillia Herter note on 06/14/19: Continue Xarelto (reduced dose due to renal function). Will refill rx.

## 2020-05-29 ENCOUNTER — Ambulatory Visit (INDEPENDENT_AMBULATORY_CARE_PROVIDER_SITE_OTHER): Payer: Managed Care, Other (non HMO) | Admitting: Vascular Surgery

## 2020-05-29 ENCOUNTER — Ambulatory Visit (HOSPITAL_COMMUNITY)
Admission: RE | Admit: 2020-05-29 | Discharge: 2020-05-29 | Disposition: A | Discharge status: Home / Self Care | Payer: Managed Care, Other (non HMO) | Source: Ambulatory Visit | Attending: Vascular Surgery | Admitting: Vascular Surgery

## 2020-05-29 ENCOUNTER — Ambulatory Visit (INDEPENDENT_AMBULATORY_CARE_PROVIDER_SITE_OTHER)
Admission: RE | Admit: 2020-05-29 | Discharge: 2020-05-29 | Disposition: A | Discharge status: Home / Self Care | Payer: Managed Care, Other (non HMO) | Source: Ambulatory Visit | Attending: Vascular Surgery | Admitting: Vascular Surgery

## 2020-05-29 ENCOUNTER — Other Ambulatory Visit: Payer: Self-pay

## 2020-05-29 ENCOUNTER — Encounter: Payer: Self-pay | Admitting: Vascular Surgery

## 2020-05-29 VITALS — BP 111/75 | HR 69 | Temp 98.4°F | Resp 20 | Ht 68.0 in | Wt 230.0 lb

## 2020-05-29 DIAGNOSIS — N186 End stage renal disease: Secondary | ICD-10-CM | POA: Diagnosis present

## 2020-05-29 NOTE — Progress Notes (Signed)
Patient ID: IBN STIEF, male   DOB: [birth year], 60 y.o.   MRN: [id number]  Reason for Consult: New Patient (Initial Visit)   Referred by Marin Olp, MD  Subjective:     HPI:  Daniel Solomon is a 60 y.o. male currently on dialysis at home via PD.  He is right-hand dominant.  He does have previous coronary artery stenting no other previous vascular disease.  He does not have any previous left arm chest or breast surgery.  No ports or pacemakers in the past.  Past Medical History:  Diagnosis Date  . Anemia   . Bicuspid aortic valve   . CAD (coronary artery disease)    proximal LAD 50%, mid LAD 50-60%, mid to distal LAD 99%; mid circumflex 20%; proximal RCA 20%, distal RCA 50%; PDA 60-70%, proximal posterior AV groove 50%, proximal posterior lateral 50%.  There was a question of possible LVOT gradient;  s/p Resolute DES to mid-distal LAD 10/2010;  echocardiogram 11/17/10: EF 55-60%, mild LVH, grade 1 diastolic dysfunction, normal aortic valve, mild MR, PASP 32.   . Chicken pox   . CKD (chronic kidney disease), stage III   . 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 Glaucoma   . Gout    allopurinol 300mg , colchicine prn in past, has not had flares  . Hypercholesterolemia    With hypertriglyceridemia  . Hypertension   . Kidney stones    hx hematuria as result. cystosopy normal  . Mild aortic stenosis   . PAF (paroxysmal atrial fibrillation) ([place])    a. Documented on event monitor 01/2016.  [name] Premature atrial contractions   . Sleep apnea    cpap nightly   Family History  Problem Relation Age of Onset  . Hypertension Father   . CAD Father        CABG 45s  . Kidney disease Father        Stage IV  . Valvular heart disease Mother        AVR July 2015  . Diabetes Paternal Grandmother   . Colon cancer Neg Hx    Past Surgical History:  Procedure Laterality Date  . CORONARY STENT PLACEMENT  2012  . CYSTOSCOPY/RETROGRADE/URETEROSCOPY  03/26/2012   Procedure:  CYSTOSCOPY/RETROGRADE/URETEROSCOPY;  Surgeon: [name], MD;  Location: [facility];  Service: Urology;  Laterality: Bilateral;  CYSTOSCOPY BILATERAL RETROGRADE PYELOGRAM AND POSSIBLE URETEROSCOPY    Short Social History:  Social History   Tobacco Use  . Smoking status: Never Smoker  . Smokeless tobacco: Never Used  Substance Use Topics  . Alcohol use: No    No Known Allergies  Current Outpatient Medications  Medication Sig Dispense Refill  . allopurinol (ZYLOPRIM) 300 MG tablet TAKE 1 TABLET BY MOUTH EVERY DAY 90 tablet 2  . atorvastatin (LIPITOR) 40 MG tablet TAKE 1 TABLET BY MOUTH EVERY DAY 90 tablet 2  . calcitRIOL (ROCALTROL) 0.25 MCG capsule Take 1 capsule by mouth daily.    [name] ELIQUIS 2.5 MG TABS tablet TAKE 1 TABLET BY MOUTH TWICE A DAY 60 tablet 5  . labetalol (NORMODYNE) 200 MG tablet Take 100 mg by mouth 2 (two) times daily.    . Tafluprost (ZIOPTAN) 0.0015 % SOLN Apply to eye. One drop in both eyes at night     No current facility-administered medications for this visit.    Review of Systems  Constitutional:  Constitutional negative. HENT: HENT negative.  Eyes: Eyes negative.  Respiratory: Respiratory negative.  Cardiovascular: Positive  for palpitations.  GI: Gastrointestinal negative.  Musculoskeletal: Musculoskeletal negative.  Skin: Skin negative.  Neurological: Neurological negative. Hematologic: Hematologic/lymphatic negative.  Psychiatric: Psychiatric negative.        Objective:  Objective   Vitals:   05/29/20 1331  BP: 111/75  Pulse: 69  Resp: 20  Temp: 98.4 F (36.9 C)  SpO2: 98%    Physical Exam HENT:     Head: Normocephalic.     Nose:     Comments: Wearing a mask Eyes:     Pupils: Pupils are equal, round, and reactive to light.  Cardiovascular:     Rate and Rhythm: Normal rate.     Pulses: Normal pulses.  Pulmonary:     Effort: Pulmonary effort is normal.  Abdominal:     General: Abdomen is flat.      Palpations: Abdomen is soft.  Musculoskeletal:        General: Normal range of motion.     Cervical back: Normal range of motion and neck supple.     Right lower leg: No edema.     Left lower leg: No edema.  Skin:    General: Skin is warm and dry.  Neurological:     Mental Status: He is alert.     Data: Right Pre-Dialysis Findings:  +-----------------------+----------+--------------------+---------+--------  +  Location        PSV (cm/s)Intralum. Diam. (cm)Waveform  Comments  +-----------------------+----------+--------------------+---------+--------  +  Brachial Antecub. fossa55    0.59        triphasic        +-----------------------+----------+--------------------+---------+--------  +  Radial Art at Wrist  55    0.19        triphasic        +-----------------------+----------+--------------------+---------+--------  +  Ulnar Art at Wrist   73    0.25        triphasic        +-----------------------+----------+--------------------+---------+--------  +   Left Pre-Dialysis Findings:  +-----------------------+----------+--------------------+---------+--------  +  Location        PSV (cm/s)Intralum. Diam. (cm)Waveform  Comments  +-----------------------+----------+--------------------+---------+--------  +  Brachial Antecub. fossa58    0.49        triphasic        +-----------------------+----------+--------------------+---------+--------  +  Radial Art at Wrist  71    0.24        triphasic        +-----------------------+----------+--------------------+---------+--------  +  Ulnar Art at Wrist   66    0.27        triphasic        +-----------------------+----------+--------------------+---------+--------  +      Assessment/Plan:     60 year old male currently dialyzing via PD.  He was here for  evaluation of HD given that his PD catheter is currently working well I think we are going to hold on further access.  I will reach out to his primary nephrologist Dr. Joelyn Oms.  If he ever does need hemodialysis access he appears to have a suitable cephalic and basilic vein in his nondominant left hand and he can be scheduled without further office evaluation.     Waynetta Sandy MD [facility]

## 2020-06-23 ENCOUNTER — Other Ambulatory Visit (HOSPITAL_COMMUNITY): Payer: Self-pay

## 2020-07-03 ENCOUNTER — Other Ambulatory Visit: Payer: Self-pay

## 2020-07-03 ENCOUNTER — Encounter: Payer: Self-pay | Admitting: Pulmonary Disease

## 2020-07-03 ENCOUNTER — Ambulatory Visit (INDEPENDENT_AMBULATORY_CARE_PROVIDER_SITE_OTHER): Payer: Managed Care, Other (non HMO) | Admitting: Pulmonary Disease

## 2020-07-03 VITALS — BP 120/78 | HR 77 | Ht 68.0 in | Wt 232.0 lb

## 2020-07-03 DIAGNOSIS — Z9989 Dependence on other enabling machines and devices: Secondary | ICD-10-CM | POA: Diagnosis not present

## 2020-07-03 DIAGNOSIS — G4733 Obstructive sleep apnea (adult) (pediatric): Secondary | ICD-10-CM

## 2020-07-03 NOTE — Patient Instructions (Signed)
Will arrange for new CPAP set up ° °Follow up in 4 to 5 months °

## 2020-07-03 NOTE — Progress Notes (Signed)
[facility]  Chief Complaint  Patient presents with  . Sleep Apnea    Needs new order for machine    Constitutional:  BP 120/78   Pulse 77   Ht 5\' 8"  (1.727 m)   Wt 232 lb (105.2 kg)   SpO2 99%   BMI 35.28 kg/m   Past Medical History:  CAD, ESRD on PD, Glaucoma, Gout, HLD, HTN, Nephrolithiasis, Atrial fibrillation  Past Surgical History:  He  has a past surgical history that includes Coronary stent placement (2012) and Cystoscopy/retrograde/ureteroscopy (03/26/2012).  Brief Summary:  Daniel Solomon is a 60 y.o. male with obstructive sleep apnea.      Subjective:   He was seen previously by Dr. Gwenette Greet.  He had home sleep study in 2012.  Found to have severe sleep apnea.  Has been on CPAP since.  He was set up with CPAP 9 cm H2O.  Uses [facility] for his DME.  His machine is now displaying error message that device has outlived his motor function.  He goes to sleep at 10 pm.  He falls asleep in 15 minutes.  He wakes up every 4 hours to manage his peritoneal dialysis device.  He gets out of bed at 730 am.  He feels okay in the morning.  He denies morning headache.  He does not use anything to help him fall sleep or stay awake.  He denies sleep walking, sleep talking, bruxism, or nightmares.  There is no history of restless legs.  He denies sleep hallucinations, sleep paralysis, or cataplexy.  The Epworth score is 8 out of 24.    Physical Exam:   Appearance - well kempt   ENMT - no sinus tenderness, no oral exudate, no LAN, Mallampati 4 airway, no stridor  Respiratory - equal breath sounds bilaterally, no wheezing or rales  CV - s1s2 regular rate and rhythm, no murmurs  Ext - no clubbing, no edema  Skin - no rashes  Psych - normal mood and affect   Sleep Tests:   HST 12/13/10 >> 45, SpO2 low 77%  Cardiac Tests:   Echo 07/01/19 >> EF 60 to 65%, mild LVH, mod LA dilation, mild RA dilation, mild AS, ascending aorta 41  mm  Social History:  He  reports that he has never smoked. He has never used smokeless tobacco. He reports that he does not drink alcohol and does not use drugs.  Family History:  His family history includes CAD in his father; Diabetes in his paternal grandmother; Hypertension in his father; Kidney disease in his father; Valvular heart disease in his mother.     Assessment/Plan:   Obstructive sleep apnea. - prior to starting CPAP he was having snoring, sleep disruption, apnea, and daytime sleepiness - he is compliant with CPAP and reports benefit from therapy - he uses [facility] for his DME - his current machine is more than 60 years old, not functioning properly, and not amenable to repair - will arrange for new CPAP at 9 cm H2O - explained his insurance might require repeat home sleep study prior to getting new device  CAD, Aortic stenosis, Persistent atrial fibrillation, Hypertension.  - followed by Dr. Lauree Chandler with [facility]  [facility] on peritoneal dialysis. - followed by Dr. Dossie Der Kidney   Time Spent Involved in Patient Care on Day of Examination:  31 minutes  Follow up:  Patient Instructions  Will arrange for new CPAP set up  Follow up in 4 to 5 months   Medication List:   Allergies as of 07/03/2020   No Known Allergies     Medication List       Accurate as of Jul 03, 2020  4:22 PM. If you have any questions, ask your nurse or doctor.        STOP taking these medications   furosemide 40 MG tablet Commonly known as: LASIX Stopped by: Chesley Mires, MD     TAKE these medications   allopurinol 300 MG tablet Commonly known as: ZYLOPRIM TAKE 1 TABLET BY MOUTH EVERY DAY   atorvastatin 40 MG tablet Commonly known as: LIPITOR TAKE 1 TABLET BY MOUTH EVERY DAY   calcitRIOL 0.25 MCG capsule Commonly known as: ROCALTROL Take 1 capsule by mouth daily.   calcium acetate 667 MG capsule Commonly known as: PHOSLO Take 1,334 mg  by mouth 3 (three) times daily.   Eliquis 2.5 MG Tabs tablet Generic drug: apixaban TAKE 1 TABLET BY MOUTH TWICE A DAY   labetalol 100 MG tablet Commonly known as: NORMODYNE Take 100 mg by mouth 2 (two) times daily.   MIRCERA IJ Inject into the skin as needed.   torsemide 100 MG tablet Commonly known as: DEMADEX Take 100 mg by mouth 2 (two) times daily.       Signature:  Chesley Mires, MD [place] Pager - [phone] 07/03/2020, 4:22 PM

## 2020-07-08 ENCOUNTER — Other Ambulatory Visit: Payer: Self-pay | Admitting: Family Medicine

## 2020-08-27 ENCOUNTER — Ambulatory Visit (INDEPENDENT_AMBULATORY_CARE_PROVIDER_SITE_OTHER): Payer: Managed Care, Other (non HMO) | Admitting: Cardiovascular Disease

## 2020-08-27 ENCOUNTER — Encounter: Payer: Self-pay | Admitting: Cardiovascular Disease

## 2020-08-27 ENCOUNTER — Other Ambulatory Visit: Payer: Self-pay

## 2020-08-27 VITALS — BP 130/82 | HR 76 | Ht 68.0 in | Wt 235.0 lb

## 2020-08-27 DIAGNOSIS — I350 Nonrheumatic aortic (valve) stenosis: Secondary | ICD-10-CM

## 2020-08-27 DIAGNOSIS — E782 Mixed hyperlipidemia: Secondary | ICD-10-CM | POA: Diagnosis not present

## 2020-08-27 DIAGNOSIS — I4819 Other persistent atrial fibrillation: Secondary | ICD-10-CM

## 2020-08-27 DIAGNOSIS — I10 Essential (primary) hypertension: Secondary | ICD-10-CM | POA: Diagnosis not present

## 2020-08-27 DIAGNOSIS — I2510 Atherosclerotic heart disease of native coronary artery without angina pectoris: Secondary | ICD-10-CM

## 2020-08-27 NOTE — Progress Notes (Signed)
Chief Complaint  Patient presents with   Follow-up    CAD    History of Present Illness: 60 yo male with history of ESRD on PD, CAD, HTN, HLD, sinus bradycardia, bicuspid aortic valve with mild AS, OSA, thoracic aortic aneurysm, CKD stage III, persistent atrial fibrillation here today for cardiac follow up. He was admitted with unstable angina in September 2012 and was found to have severe stenosis in the mid to distal LAD treated with a 2.25 x 18 mm Resolute DES. He was also found to have moderate mid LAD and distal RCA disease. In December 2017 he was found to have a branch retinal artery occlusion in his right eye. Echo November 2017 with LVEF=55-60%, mild AS, mildly dilated aortic root. Carotid dopplers with mild disease. Event monitor showed atrial fibrillation. He was in atrial fibrillation at time of office visit here in January 2018. He was started on Xarelto January 2018. Echo May 2021 with LVEF=60-65%. Mild LVH. Mild aortic valve stenosis.   He is here today for follow up. The patient denies any chest pain, dyspnea, palpitations, lower extremity edema, orthopnea, PND, dizziness, near syncope or syncope. He started PD for ESRD in February 2022. He is being seen at [facility] in the transplant center. Echo at [facility] last week with moderate AS, normal LV function. I cannot see these images but the report is available Care Everywhere. Mean gradient of 31.6 mmHg, AVA 0.8 cm2, dimensionless index 0.30.   Primary Care Physician: Marin Olp, MD   Past Medical History:  Diagnosis Date   Anemia    Bicuspid aortic valve    CAD (coronary artery disease)    proximal LAD 50%, mid LAD 50-60%, mid to distal LAD 99%; mid circumflex 20%; proximal RCA 20%, distal RCA 50%; PDA 60-70%, proximal posterior AV groove 50%, proximal posterior lateral 50%.  There was a question of possible LVOT gradient;  s/p Resolute DES to mid-distal LAD 10/2010;  echocardiogram 11/17/10: EF 55-60%, mild  LVH, grade 1 diastolic dysfunction, normal aortic valve, mild MR, PASP 32.    Chicken pox    CKD (chronic kidney disease), stage III ([name])    Enlarged aorta ([name])    Glaucoma    Gout    allopurinol 300mg , colchicine prn in past, has not had flares   Hypercholesterolemia    With hypertriglyceridemia   Hypertension    Kidney stones    hx hematuria as result. cystosopy normal   Mild aortic stenosis    PAF (paroxysmal atrial fibrillation) ([place])    a. Documented on event monitor 01/2016.   Premature atrial contractions    Sleep apnea    cpap nightly    Past Surgical History:  Procedure Laterality Date   CORONARY STENT PLACEMENT  2012   CYSTOSCOPY/RETROGRADE/URETEROSCOPY  03/26/2012   Procedure: CYSTOSCOPY/RETROGRADE/URETEROSCOPY;  Surgeon: [name], MD;  Location: [facility];  Service: Urology;  Laterality: Bilateral;  CYSTOSCOPY BILATERAL RETROGRADE PYELOGRAM AND POSSIBLE URETEROSCOPY    Current Outpatient Medications  Medication Sig Dispense Refill   allopurinol (ZYLOPRIM) 300 MG tablet TAKE 1 TABLET BY MOUTH EVERY DAY 90 tablet 2   amLODipine (NORVASC) 5 MG tablet TAKE 1 TABLET BY MOUTH EVERY DAY 90 tablet 1   atorvastatin (LIPITOR) 40 MG tablet TAKE 1 TABLET BY MOUTH EVERY DAY 90 tablet 2   calcitRIOL (ROCALTROL) 0.25 MCG capsule Take 1 capsule by mouth daily.     calcium acetate (PHOSLO) 667 MG capsule Take 1,334  mg by mouth 3 (three) times daily.     ELIQUIS 2.5 MG TABS tablet TAKE 1 TABLET BY MOUTH TWICE A DAY 60 tablet 5   labetalol (NORMODYNE) 100 MG tablet Take 100 mg by mouth 2 (two) times daily.     Methoxy PEG-Epoetin Beta (MIRCERA IJ) Inject into the skin as needed.     torsemide (DEMADEX) 100 MG tablet Take 100 mg by mouth 2 (two) times daily.     No current facility-administered medications for this visit.    No Known Allergies  Social History   Socioeconomic History   Marital status: Married    Spouse name: Not on file   Number of  children: 4   Years of education: Not on file   Highest education level: Not on file  Occupational History   Occupation: Lawyer: [employer]  Tobacco Use   Smoking status: Never   Smokeless tobacco: Never  Vaping Use   Vaping Use: Never used  Substance and Sexual Activity   Alcohol use: No   Drug use: No   Sexual activity: Not on file  Other Topics Concern   Not on file  Social History Narrative   Family: Married, 4 children, 2 grandkids- both with red hair (87 year old in 2016)      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)   BS at [employer]: reading, church activities- [employer]   Social Determinants of Health   Financial Resource Strain: Not on file  Food Insecurity: Not on file  Transportation Needs: Not on file  Physical Activity: Not on file  Stress: Not on file  Social Connections: Not on file  Intimate Partner Violence: Not on file    Family History  Problem Relation Age of Onset   Hypertension Father    CAD Father        CABG 50s   Kidney disease Father        Stage IV   Valvular heart disease Mother        AVR July 2015   Diabetes Paternal Grandmother    Colon cancer Neg Hx     Review of Systems:  As stated in the HPI and otherwise negative.   BP 130/82   Pulse 76   Ht 5\' 8"  (1.727 m)   Wt 235 lb (106.6 kg)   SpO2 99%   BMI 35.73 kg/m   Physical Examination: General: Well developed, well nourished, NAD  HEENT: OP clear, mucus membranes moist  SKIN: warm, dry. No rashes. Neuro: No focal deficits  Musculoskeletal: Muscle strength 5/5 all ext  Psychiatric: Mood and affect normal  Neck: No JVD, no carotid bruits, no thyromegaly, no lymphadenopathy.  Lungs:Clear bilaterally, no wheezes, rhonci, crackles Cardiovascular: Irreg irreg. Systolic murmur.  Abdomen:Soft. Bowel sounds present. Non-tender.  Extremities: No lower extremity  edema. Pulses are 2 + in the bilateral DP/PT.  Cardiac cath 11/17/10: 1. Left main coronary artery: The vessel is normal in size and free  of significant disease.  2. Left anterior descending artery: The vessel is normal in size with  disease noted throughout its course. Proximally, just before the  first septal branch, there is 50% stenosis. In the mid segment  right after second diagonal, there is another 50%-60% eccentric  lesion. In the mid-to-distal LAD before the third diagonal, there  is a 99% tubular stenosis. First diagonal is overall small in size  and free of significant disease. Second diagonal is normal in size  and free of significant disease. Third diagonal is very small in  size.  3. Left circumflex artery: The vessel is normal in size but  nondominant. It has minor irregularities without obstructive  disease. There is a 20% lesion in the mid segment right after OM- 1.  4. Right coronary artery: The vessel is very large in size and  dominant. There is a 20% proximal stenosis. There is minor  irregularities in the mid segment. Distally, right at PDA and the  AV groove bifurcation, there is a 50% stenosis. The PDA itself is  medium in size and diffusely diseased throughout its course with  60%-70% disease noted with plaque formation. The distal area is  too small. The posterior AV groove has a 50% proximal lesion and  it gives a large posterolateral branch which has a 50% proximal  stenosis. The second posterolateral branch is small in size and  free of significant disease.  Echo 07/01/19:   1. Left ventricular ejection fraction, by estimation, is 60 to 65%. The  left ventricle has normal function. The left ventricle has no regional  wall motion abnormalities. There is mild left ventricular hypertrophy.  Left ventricular diastolic parameters  are indeterminate.   2. Right ventricular systolic function is normal. The right ventricular  size is normal. There is normal  pulmonary artery systolic pressure. The  estimated right ventricular systolic pressure is 27.0 mmHg.   3. Left atrial size was moderately dilated.   4. Right atrial size was mildly dilated.   5. The mitral valve is normal in structure. Trivial mitral valve  regurgitation. No evidence of mitral stenosis.   6. The aortic valve is probably tricuspid (reported bicuspid in past,  difficult to visualize but looks like probably tricuspid). Aortic valve  regurgitation is not visualized. Mild aortic valve stenosis. Aortic valve  mean gradient measures 15.0 mmHg, AVA   1.5 cm^2.   7. Aortic dilatation noted. There is mild dilatation of the ascending  aorta measuring 41 mm.   8. The inferior vena cava is normal in size with greater than 50%  respiratory variability, suggesting right atrial pressure of 3 mmHg.   Echo [facility] 08/25/20:  SUMMARY  The left ventricular size is normal.  There is moderate concentric left ventricular hypertrophy.  Left ventricular systolic function is normal.  LV ejection fraction = 60-65%.  The left ventricular wall motion is normal.  The right ventricle is normal size.  The right ventricular systolic function is normal.  There is moderate to severe aortic stenosis. The peak velocity in most  views was around 3.5 m/sec, but in limited views, it rose to 4.0  m/sec.  The aortic sinus is normal size.  Mildly dilated ascending aorta (4.2 cm)  IVC size was normal.  There is no pericardial effusion.  No old study.   -  FINDINGS:  LEFT VENTRICLE  The left ventricular size is normal. There is moderate concentric left  ventricular hypertrophy. Left ventricular systolic function is normal.  LV ejection fraction = 60-65%. Left ventricular filling pattern is  indeterminate. The left ventricular wall motion is normal.   -  RIGHT VENTRICLE  The right ventricle is normal size. The right ventricular systolic  function is normal. The right ventricular wall motion is normal.    LEFT ATRIUM  The left atrium is mildly to moderately dilated.  RIGHT ATRIUM  Right atrial size is normal.  -  AORTIC VALVE  Aortic valve calcification. There is moderate to severe aortic  stenosis.  -  MITRAL VALVE  The mitral valve leaflets appear normal. There is no mitral  regurgitation noted.  -  TRICUSPID VALVE  Structurally normal tricuspid valve. No tricuspid regurgitation.  -  PULMONIC VALVE  The pulmonic valve is not well visualized. There is no pulmonic  valvular regurgitation.  -  ARTERIES  The aortic sinus is normal size. Mildly dilated ascending aorta.  -  VENOUS  IVC size was normal.  -  EFFUSION  There is no pericardial effusion. There is no pleural effusion.  -  -   MMode/2D Measurements & Calculations  IVSd: 1.7 cm     LA diam: 5.5 cm ESV(MOD-sp4):     Ao sinus diam:  LVIDd: 4.0 cm    RVDd: 4.3 cm    30.3 ml           3.2 cm  LVPWd: 1.5 cm    EDV(MOD-sp4):   EDV(MOD-sp2):  LVIDs: 2.7 cm    84.3 ml         60.7 ml                                   ESV(MOD-sp2):                                   22.5 ml          _______________________________________________________________________  asc Aorta Diam:  LVOT diam:      SV(MOD-sp4):      IVC 1: 2.4 cm  4.2 cm           1.8 cm          54.0 ml                                   SI(MOD-sp4):                                   24.7 ml/m2          _______________________________________________________________________  LA area A2:      LA area A4:     LA vol: 104.8 ml  LA vol index:  27.8 cm2         31.0 cm2                          47.9 ml/m2           _______________________________________________________________________  RA area A4:   15.0 cm2   Doppler Measurements & Calculations  MV E max vel:        SV(LVOT): 62.0 ml LV V1 VTI:     PA max PG:  116.0 cm/sec         Ao V2 max:        23.7 cm        6.3 mmHg  Med Peak E' Vel:     399.0 cm/sec  10.3 cm/sec          Ao max PG:  Lat Peak E'  Vel:     63.7 mmHg  6.5 cm/sec  Ao V2 mean:  E/Lat E`: 18.0       260.3 cm/sec  E/Med E`: 11.3       Ao mean PG:                       31.6 mmHg                       Ao V2 VTI:                       77.8 cm                       AVA (VTI):                       0.80 cm2           _______________________________________________________________________  AS Dimensionless     AVAi(VTI)         RV Peak E' Vel:SV index(LVOT):  Index (VTI): 0.30    cm^2/m^2:         9.4 cm/sec     28.4 ml/m2                       0.36 cm2   EKG:  EKG is ordered today. The ekg ordered today demonstrates Atrial fibrillation, rate 76 bpm  Recent Labs: 01/29/2020: BUN 75; Creatinine 8.1; Hemoglobin 10.7; Platelets 136; Potassium 4.7; Sodium 143   Lipid Panel    Component Value Date/Time   CHOL 124 10/24/2018 1014   CHOL 133 06/06/2017 0804   TRIG 187.0 (H) 10/24/2018 1014   HDL 37.70 (L) 10/24/2018 1014   HDL 34 (L) 06/06/2017 0804   CHOLHDL 3 10/24/2018 1014   VLDL 37.4 10/24/2018 1014   LDLCALC 49 10/24/2018 1014   LDLCALC 58 06/06/2017 0804   LDLDIRECT 70.9 02/10/2012 0737     Wt Readings from Last 3 Encounters:  08/27/20 235 lb (106.6 kg)  07/03/20 232 lb (105.2 kg)  05/29/20 230 lb (104.3 kg)     Assessment and Plan:   1. CAD without angina: He has no chest pain. No beta blocker with bradycardia. Continue statin. No ASA since he is on Xarelto.   2. HTN: BP is well controlled. Continue current therapy  3. HLD: LDL 49 in August 2020. Repeat lipids and LFTs now. Continue statin.     4. Atrial fibrillation, persistent: He is in atrial fib today. Rate is controlled. Continue Xarelto (reduced dose due to renal function).   5. Aortic stenosis: He likely has a bicuspid AV. Mild AS by echo in 2021 here. Echo at [facility] last week with moderate AS. Mean gradient 31.6 mmHg with DI 0.30. He has no symptoms. I would not consider any further consideration for AVR at this time  unless it has to be done before a kidney transplant.   6. Carotid artery disease: He is known to have mild bilateral carotid disease by dopplers 2017. Repeat at f/u visit.   Current medicines are reviewed at length with the patient today.  The patient does not have concerns regarding medicines.  The following changes have been made:  no change  Labs/ tests ordered today include:   Orders Placed This Encounter  Procedures   Lipid Profile   Hepatic function panel   EKG 12-Lead    Disposition:   F/U with me in 12  months   Signed, Lauree Chandler, MD 08/27/2020 5:07 PM    [facility] [address], [place], [place]  [address] Phone: [phone]; Fax: [phone]

## 2020-08-27 NOTE — Patient Instructions (Signed)
Medication Instructions:  Your physician recommends that you continue on your current medications as directed. Please refer to the Current Medication list given to you today.  *If you need a refill on your cardiac medications before your next appointment, please call your pharmacy*   Lab Work: Your physician recommends that you return for lab work Friday July 1 You may come in anytime between 7:30 am and 4:45 pm  You will need to FAST for this appointment - nothing to eat or drink after midnight the night before except water.  If you have labs (blood work) drawn today and your tests are completely normal, you will receive your results only by: [place] (if you have MyChart) OR A paper copy in the mail If you have any lab test that is abnormal or we need to change your treatment, we will call you to review the results.   Testing/Procedures: None Ordered   Follow-Up: At [facility], you and your health needs are our priority.  As part of our continuing mission to provide you with exceptional heart care, we have created designated Provider Care Teams.  These Care Teams include your primary Cardiologist (physician) and Advanced Practice Providers (APPs -  Physician Assistants and Nurse Practitioners) who all work together to provide you with the care you need, when you need it.   Your next appointment:   6 month(s)  The format for your next appointment:   In Person  Provider:   You may see Lauree Chandler, MD or one of the following Advanced Practice Providers on your designated Care Team:   Melina Copa, PA-C Ermalinda Barrios, PA-C

## 2020-08-28 ENCOUNTER — Other Ambulatory Visit: Payer: Managed Care, Other (non HMO) | Admitting: *Deleted

## 2020-08-28 DIAGNOSIS — I2510 Atherosclerotic heart disease of native coronary artery without angina pectoris: Secondary | ICD-10-CM

## 2020-08-28 DIAGNOSIS — E782 Mixed hyperlipidemia: Secondary | ICD-10-CM

## 2020-08-28 DIAGNOSIS — I10 Essential (primary) hypertension: Secondary | ICD-10-CM

## 2020-08-28 LAB — HEPATIC FUNCTION PANEL
ALT: 19 IU/L (ref 0–44)
AST: 6 IU/L (ref 0–40)
Albumin: 4.3 g/dL (ref 3.8–4.9)
Alkaline Phosphatase: 81 IU/L (ref 44–121)
Bilirubin Total: 0.4 mg/dL (ref 0.0–1.2)
Bilirubin, Direct: 0.15 mg/dL (ref 0.00–0.40)
Total Protein: 6.6 g/dL (ref 6.0–8.5)

## 2020-08-28 LAB — LIPID PANEL
Chol/HDL Ratio: 3.2 ratio (ref 0.0–5.0)
Cholesterol, Total: 95 mg/dL — ABNORMAL LOW (ref 100–199)
HDL: 30 mg/dL — ABNORMAL LOW (ref >39)
LDL Chol Calc (NIH): 39 mg/dL (ref 0–99)
Triglycerides: 155 mg/dL — ABNORMAL HIGH (ref 0–149)
VLDL Cholesterol Cal: 26 mg/dL (ref 5–40)

## 2020-09-01 DIAGNOSIS — I350 Nonrheumatic aortic (valve) stenosis: Secondary | ICD-10-CM

## 2020-09-02 NOTE — Telephone Encounter (Signed)
Order placed for echo for aortic valve stenosis.

## 2020-09-22 ENCOUNTER — Telehealth (INDEPENDENT_AMBULATORY_CARE_PROVIDER_SITE_OTHER): Payer: Managed Care, Other (non HMO) | Admitting: Family Medicine

## 2020-09-22 VITALS — Ht 68.0 in | Wt 220.0 lb

## 2020-09-22 DIAGNOSIS — K529 Noninfective gastroenteritis and colitis, unspecified: Secondary | ICD-10-CM

## 2020-09-22 NOTE — Progress Notes (Signed)
   Daniel Solomon is a 60 y.o. male who presents today for a virtual office visit.  Assessment/Plan:  Gastroenteritis Thankfully symptoms seem to be resolving.  We will continue with watchful waiting conservative management.  Offered prescription for Zofran however patient declined.    Subjective:  HPI:  Patient here with multiple days of nausea, vomiting, and diarrhea.  Last episode was yesterday.  Feels better today.  Had low-grade fever.  Wife was sick with similar symptoms.  No specific treatments tried.  He has been compliant with his dialysis.  Thinks he has been staying well-hydrated.       Objective/Observations  Physical Exam: Gen: NAD, resting comfortably Pulm: Normal work of breathing Neuro: Grossly normal, moves all extremities Psych: Normal affect and thought content  Virtual Visit via Video   I connected with [name] on 09/22/20 at  3:40 PM EDT by a video enabled telemedicine application and verified that I am speaking with the correct person using two identifiers. The limitations of evaluation and management by telemedicine and the availability of in person appointments were discussed. The patient expressed understanding and agreed to proceed.   Patient location: Home Provider location: [place] participating in the virtual visit: Myself and Patient     Algis Greenhouse. Jerline Pain, MD 09/22/2020 10:22 AM

## 2020-09-23 ENCOUNTER — Other Ambulatory Visit: Payer: Self-pay

## 2020-09-23 ENCOUNTER — Encounter (HOSPITAL_COMMUNITY): Payer: Self-pay

## 2020-09-23 ENCOUNTER — Ambulatory Visit (HOSPITAL_COMMUNITY): Payer: Managed Care, Other (non HMO) | Attending: Cardiovascular Disease

## 2020-09-23 NOTE — Progress Notes (Signed)
Verified appointment "no show" status with AEulas Post at 07:27.

## 2020-09-25 ENCOUNTER — Ambulatory Visit (HOSPITAL_COMMUNITY): Payer: Managed Care, Other (non HMO) | Attending: Cardiovascular Disease

## 2020-09-25 ENCOUNTER — Other Ambulatory Visit: Payer: Self-pay

## 2020-09-25 DIAGNOSIS — I350 Nonrheumatic aortic (valve) stenosis: Secondary | ICD-10-CM

## 2020-09-25 LAB — ECHOCARDIOGRAM COMPLETE
AR max vel: 1.3 cm2
AV Area VTI: 1.27 cm2
AV Area mean vel: 1.2 cm2
AV Mean grad: 22.2 mmHg
AV Peak grad: 36.8 mmHg
Ao pk vel: 3.03 m/s
S' Lateral: 2.6 cm

## 2020-11-10 ENCOUNTER — Telehealth: Payer: Self-pay | Admitting: Cardiovascular Disease

## 2020-11-10 NOTE — Telephone Encounter (Signed)
  pt would like to know what next steps are in order to get a valve replaced.. please advise

## 2020-11-10 NOTE — Telephone Encounter (Signed)
Pt called in reply to MyChart message from Dr. Angelena Form.  Reviewed with Structural Heart Nurse Navigator.  Pt wishes to have an appointment to discuss AVR. Pt has been scheduled for TAVR consult on 11/19/20 with Dr. Angelena Form.

## 2020-11-19 ENCOUNTER — Encounter: Payer: Self-pay | Admitting: Cardiovascular Disease

## 2020-11-19 ENCOUNTER — Ambulatory Visit (INDEPENDENT_AMBULATORY_CARE_PROVIDER_SITE_OTHER): Payer: Managed Care, Other (non HMO) | Admitting: Cardiovascular Disease

## 2020-11-19 ENCOUNTER — Other Ambulatory Visit: Payer: Self-pay

## 2020-11-19 VITALS — BP 118/78 | HR 76 | Ht 68.0 in | Wt 220.4 lb

## 2020-11-19 DIAGNOSIS — I350 Nonrheumatic aortic (valve) stenosis: Secondary | ICD-10-CM | POA: Diagnosis not present

## 2020-11-19 DIAGNOSIS — I4819 Other persistent atrial fibrillation: Secondary | ICD-10-CM

## 2020-11-19 DIAGNOSIS — Z01812 Encounter for preprocedural laboratory examination: Secondary | ICD-10-CM

## 2020-11-19 DIAGNOSIS — I2510 Atherosclerotic heart disease of native coronary artery without angina pectoris: Secondary | ICD-10-CM

## 2020-11-19 NOTE — Progress Notes (Signed)
Chief Complaint  Patient presents with   Follow-up    Aortic stenosis   History of Present Illness: 60 yo male with history of ESRD on PD, CAD, HTN, HLD, sinus bradycardia, bicuspid aortic valve with moderate AS, sleep apnea, thoracic aortic aneurysm, persistent atrial fibrillation here today for cardiac follow up. He was admitted with unstable angina in September 2012 and was found to have severe stenosis in the mid to distal LAD treated with a 2.25 x 18 mm Resolute DES. He was also found to have moderate mid LAD and distal RCA disease. In December 2017 he was found to have a branch retinal artery occlusion in his right eye. Echo November 2017 with LVEF=55-60%, mild AS, mildly dilated aortic root. Carotid dopplers with mild disease. Event monitor showed atrial fibrillation. He was in atrial fibrillation at time of office visit here in January 2018. He was started on Xarelto January 2018. Echo May 2021 with LVEF=60-65%. Mild LVH. Likely bicuspid aortic valve with mild aortic valve stenosis. Most recent echo July 2022 with LVEF=60-65%. Mild LVH. The aortic valve appears to be bicuspid with fused non and right cusps. The aortic valve leaflets are thickened and calcified. Mean gradient 22 mmHg. Peak gradient 36.8 mmHg, AVA 1.2 cm2, dimensionless index 0.30. This is consistent with moderate aortic stenosis. Echo at [facility] June 2022 with moderate to severe AS, normal LV function. I cannot see these images but the report is available in Care Everywhere. Mean gradient of 31.6 mmHg, AVA 0.8 cm2, dimensionless index 0.30. He has been told that he cannot have a kidney transplant at [facility] unless he has TAVR first.   He is here today to review options for treatment of his aortic stenosis. The patient denies any chest pain, dyspnea, palpitations, lower extremity edema, orthopnea, PND, dizziness, near syncope or syncope. He has no active dental issues. He sees his dentist every six months. He lives in  [place]. He is a Financial planner.   Primary Care Physician: Marin Olp, MD  Past Medical History:  Diagnosis Date   Anemia    Bicuspid aortic valve    CAD (coronary artery disease)    proximal LAD 50%, mid LAD 50-60%, mid to distal LAD 99%; mid circumflex 20%; proximal RCA 20%, distal RCA 50%; PDA 60-70%, proximal posterior AV groove 50%, proximal posterior lateral 50%.  There was a question of possible LVOT gradient;  s/p Resolute DES to mid-distal LAD 10/2010;  echocardiogram 11/17/10: EF 55-60%, mild LVH, grade 1 diastolic dysfunction, normal aortic valve, mild MR, PASP 32.    Chicken pox    CKD (chronic kidney disease), stage III ([name])    Enlarged aorta ([name])    Glaucoma    Gout    allopurinol 300mg , colchicine prn in past, has not had flares   Hypercholesterolemia    With hypertriglyceridemia   Hypertension    Kidney stones    hx hematuria as result. cystosopy normal   Mild aortic stenosis    PAF (paroxysmal atrial fibrillation) ([place])    a. Documented on event monitor 01/2016.   Premature atrial contractions    Sleep apnea    cpap nightly    Past Surgical History:  Procedure Laterality Date   CORONARY STENT PLACEMENT  2012   CYSTOSCOPY/RETROGRADE/URETEROSCOPY  03/26/2012   Procedure: CYSTOSCOPY/RETROGRADE/URETEROSCOPY;  Surgeon: [name], MD;  Location: [facility];  Service: Urology;  Laterality: Bilateral;  CYSTOSCOPY BILATERAL RETROGRADE PYELOGRAM AND POSSIBLE URETEROSCOPY  Current Outpatient Medications  Medication Sig Dispense Refill   allopurinol (ZYLOPRIM) 300 MG tablet TAKE 1 TABLET BY MOUTH EVERY DAY 90 tablet 2   amLODipine (NORVASC) 5 MG tablet TAKE 1 TABLET BY MOUTH EVERY DAY 90 tablet 1   atorvastatin (LIPITOR) 40 MG tablet TAKE 1 TABLET BY MOUTH EVERY DAY 90 tablet 2   calcitRIOL (ROCALTROL) 0.25 MCG capsule Take 2 capsules by mouth daily.     calcium acetate (PHOSLO) 667 MG capsule Take 1,334 mg by mouth 3 (three) times  daily.     ELIQUIS 2.5 MG TABS tablet TAKE 1 TABLET BY MOUTH TWICE A DAY 60 tablet 5   labetalol (NORMODYNE) 100 MG tablet Take 100 mg by mouth 2 (two) times daily.     Methoxy PEG-Epoetin Beta (MIRCERA IJ) Inject into the skin as needed.     torsemide (DEMADEX) 100 MG tablet Take 100 mg by mouth 2 (two) times daily.     No current facility-administered medications for this visit.    No Known Allergies  Social History   Socioeconomic History   Marital status: Married    Spouse name: Not on file   Number of children: 4   Years of education: Not on file   Highest education level: Not on file  Occupational History   Occupation: Lawyer: [employer]  Tobacco Use   Smoking status: Never   Smokeless tobacco: Never  Vaping Use   Vaping Use: Never used  Substance and Sexual Activity   Alcohol use: No   Drug use: No   Sexual activity: Not on file  Other Topics Concern   Not on file  Social History Narrative   Family: Married, 4 children, 2 grandkids- both with red hair (52 year old in 2016)      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)   BS at [employer]: reading, church activities- [employer]   Social Determinants of Health   Financial Resource Strain: Not on file  Food Insecurity: Not on file  Transportation Needs: Not on file  Physical Activity: Not on file  Stress: Not on file  Social Connections: Not on file  Intimate Partner Violence: Not on file    Family History  Problem Relation Age of Onset   Hypertension Father    CAD Father        CABG 33s   Kidney disease Father        Stage IV   Valvular heart disease Mother        AVR July 2015   Diabetes Paternal Grandmother    Colon cancer Neg Hx     Review of Systems:  As stated in the HPI and otherwise negative.   BP 118/78   Pulse 76   Ht 5\' 8"  (1.727 m)   Wt 220 lb 6.4 oz (100 kg)   SpO2 98%    BMI 33.51 kg/m   Physical Examination: General: Well developed, well nourished, NAD  HEENT: OP clear, mucus membranes moist  SKIN: warm, dry. No rashes. Neuro: No focal deficits  Musculoskeletal: Muscle strength 5/5 all ext  Psychiatric: Mood and affect normal  Neck: No JVD, no carotid bruits, no thyromegaly, no lymphadenopathy.  Lungs:Clear bilaterally, no wheezes, rhonci, crackles Cardiovascular: Regular rate and rhythm. Harsh systolic murmur.  Abdomen:Soft. Bowel sounds present. Non-tender.  Extremities: No  lower extremity edema. Pulses are 2 + in the bilateral DP/PT.  Cardiac cath 11/17/10: 1. Left main coronary artery: The vessel is normal in size and free  of significant disease.  2. Left anterior descending artery: The vessel is normal in size with  disease noted throughout its course. Proximally, just before the  first septal branch, there is 50% stenosis. In the mid segment  right after second diagonal, there is another 50%-60% eccentric  lesion. In the mid-to-distal LAD before the third diagonal, there  is a 99% tubular stenosis. First diagonal is overall small in size  and free of significant disease. Second diagonal is normal in size  and free of significant disease. Third diagonal is very small in  size.  3. Left circumflex artery: The vessel is normal in size but  nondominant. It has minor irregularities without obstructive  disease. There is a 20% lesion in the mid segment right after OM- 1.  4. Right coronary artery: The vessel is very large in size and  dominant. There is a 20% proximal stenosis. There is minor  irregularities in the mid segment. Distally, right at PDA and the  AV groove bifurcation, there is a 50% stenosis. The PDA itself is  medium in size and diffusely diseased throughout its course with  60%-70% disease noted with plaque formation. The distal area is  too small. The posterior AV groove has a 50% proximal lesion and  it gives a large  posterolateral branch which has a 50% proximal  stenosis. The second posterolateral branch is small in size and  free of significant disease.  Echo July 2022:  1. Left ventricular ejection fraction, by estimation, is 60 to 65%. The  left ventricle has normal function. The left ventricle has no regional  wall motion abnormalities. There is mild left ventricular hypertrophy.  Left ventricular diastolic parameters  are indeterminate.   2. Right ventricular systolic function is normal. The right ventricular  size is normal.   3. Left atrial size was moderately dilated.   4. The mitral valve is abnormal. Trivial mitral valve regurgitation. No  evidence of mitral stenosis.   5. Since study 07/01/19 mean gradient increased from 15->22.2 mmHg peak  25.8 -> 36.8 mmhg Better viewed on this study likely bicuspid with  raphe/fused non and right cusps . The aortic valve is normal in structure.  There is moderate calcification of the  aortic valve. There is moderate thickening of the aortic valve. Aortic  valve regurgitation is not visualized. Moderate aortic valve stenosis.   6. Aortic dilatation noted. There is mild dilatation of the ascending  aorta, measuring 42 mm.   7. The inferior vena cava is normal in size with greater than 50%  respiratory variability, suggesting right atrial pressure of 3 mmHg.   FINDINGS   Left Ventricle: Left ventricular ejection fraction, by estimation, is 60  to 65%. The left ventricle has normal function. The left ventricle has no  regional wall motion abnormalities. The left ventricular internal cavity  size was normal in size. There is   mild left ventricular hypertrophy. Left ventricular diastolic parameters  are indeterminate.   Right Ventricle: The right ventricular size is normal. No increase in  right ventricular wall thickness. Right ventricular systolic function is  normal.   Left Atrium: Left atrial size was moderately dilated.   Right Atrium: Right  atrial size was normal in size.   Pericardium: There is no evidence of pericardial effusion.   Mitral Valve: The mitral  valve is abnormal. There is mild thickening of  the mitral valve leaflet(s). There is mild calcification of the mitral  valve leaflet(s). Mild mitral annular calcification. Trivial mitral valve  regurgitation. No evidence of mitral  valve stenosis.   Tricuspid Valve: The tricuspid valve is normal in structure. Tricuspid  valve regurgitation is trivial. No evidence of tricuspid stenosis.   Aortic Valve: Since study 07/01/19 mean gradient increased from 15->22.2  mmHg peak 25.8 -> 36.8 mmhg Better viewed on this study likely bicuspid  with raphe/fused non and right cusps. The aortic valve is normal in  structure. There is moderate calcification  of the aortic valve. There is moderate thickening of the aortic valve.  Aortic valve regurgitation is not visualized. Moderate aortic stenosis is  present. Aortic valve mean gradient measures 22.2 mmHg. Aortic valve peak  gradient measures 36.8 mmHg. Aortic   valve area, by VTI measures 1.27 cm.   Pulmonic Valve: The pulmonic valve was normal in structure. Pulmonic valve  regurgitation is not visualized. No evidence of pulmonic stenosis.   Aorta: Aortic dilatation noted. There is mild dilatation of the ascending  aorta, measuring 42 mm.   Venous: The inferior vena cava is normal in size with greater than 50%  respiratory variability, suggesting right atrial pressure of 3 mmHg.   IAS/Shunts: No atrial level shunt detected by color flow Doppler.      LEFT VENTRICLE  PLAX 2D  LVIDd:         3.90 cm  Diastology  LVIDs:         2.60 cm  LV e' medial:    9.86 cm/s  LV PW:         1.20 cm  LV E/e' medial:  10.1  LV IVS:        1.00 cm  LV e' lateral:   13.47 cm/s  LVOT diam:     2.30 cm  LV E/e' lateral: 7.4  LV SV:         78  LV SV Index:   37  LVOT Area:     4.15 cm      RIGHT VENTRICLE  RV Basal diam:  4.60 cm  RV  S prime:     13.00 cm/s  TAPSE (M-mode): 2.4 cm   LEFT ATRIUM             Index       RIGHT ATRIUM           Index  LA diam:        4.70 cm 2.21 cm/m  RA Area:     14.90 cm  LA Vol (A2C):   52.9 ml 24.86 ml/m RA Volume:   42.90 ml  20.16 ml/m  LA Vol (A4C):   89.3 ml 41.96 ml/m  LA Biplane Vol: 75.3 ml 35.38 ml/m   AORTIC VALVE  AV Area (Vmax):    1.30 cm  AV Area (Vmean):   1.20 cm  AV Area (VTI):     1.27 cm  AV Vmax:           303.40 cm/s  AV Vmean:          223.000 cm/s  AV VTI:            0.615 m  AV Peak Grad:      36.8 mmHg  AV Mean Grad:      22.2 mmHg  LVOT Vmax:         95.05 cm/s  LVOT  Vmean:        64.600 cm/s  LVOT VTI:          0.188 m  LVOT/AV VTI ratio: 0.30     AORTA  Ao Root diam: 3.10 cm  Ao Asc diam:  4.20 cm   MV E velocity: 99.07 cm/s                             SHUNTS                             Systemic VTI:  0.19 m                             Systemic Diam: 2.30 cm   Echo [facility] 08/25/20:  SUMMARY  The left ventricular size is normal.  There is moderate concentric left ventricular hypertrophy.  Left ventricular systolic function is normal.  LV ejection fraction = 60-65%.  The left ventricular wall motion is normal.  The right ventricle is normal size.  The right ventricular systolic function is normal.  There is moderate to severe aortic stenosis. The peak velocity in most  views was around 3.5 m/sec, but in limited views, it rose to 4.0  m/sec.  The aortic sinus is normal size.  Mildly dilated ascending aorta (4.2 cm)  IVC size was normal.  There is no pericardial effusion.  No old study.   -  FINDINGS:  LEFT VENTRICLE  The left ventricular size is normal. There is moderate concentric left  ventricular hypertrophy. Left ventricular systolic function is normal.  LV ejection fraction = 60-65%. Left ventricular filling pattern is  indeterminate. The left ventricular wall motion is normal.   -  RIGHT VENTRICLE  The right ventricle  is normal size. The right ventricular systolic  function is normal. The right ventricular wall motion is normal.   LEFT ATRIUM  The left atrium is mildly to moderately dilated.   RIGHT ATRIUM  Right atrial size is normal.  -  AORTIC VALVE  Aortic valve calcification. There is moderate to severe aortic  stenosis.  -  MITRAL VALVE  The mitral valve leaflets appear normal. There is no mitral  regurgitation noted.  -  TRICUSPID VALVE  Structurally normal tricuspid valve. No tricuspid regurgitation.  -  PULMONIC VALVE  The pulmonic valve is not well visualized. There is no pulmonic  valvular regurgitation.  -  ARTERIES  The aortic sinus is normal size. Mildly dilated ascending aorta.  -  VENOUS  IVC size was normal.  -  EFFUSION  There is no pericardial effusion. There is no pleural effusion.  -  -   MMode/2D Measurements & Calculations  IVSd: 1.7 cm     LA diam: 5.5 cm ESV(MOD-sp4):     Ao sinus diam:  LVIDd: 4.0 cm    RVDd: 4.3 cm    30.3 ml           3.2 cm  LVPWd: 1.5 cm    EDV(MOD-sp4):   EDV(MOD-sp2):  LVIDs: 2.7 cm    84.3 ml         60.7 ml                                   ESV(MOD-sp2):  22.5 ml          _______________________________________________________________________  asc Aorta Diam:  LVOT diam:      SV(MOD-sp4):      IVC 1: 2.4 cm  4.2 cm           1.8 cm          54.0 ml                                   SI(MOD-sp4):                                   24.7 ml/m2          _______________________________________________________________________  LA area A2:      LA area A4:     LA vol: 104.8 ml  LA vol index:  27.8 cm2         31.0 cm2                          47.9 ml/m2           _______________________________________________________________________  RA area A4:   15.0 cm2   Doppler Measurements & Calculations  MV E max vel:        SV(LVOT): 62.0 ml LV V1 VTI:     PA max PG:  116.0 cm/sec         Ao V2 max:         23.7 cm        6.3 mmHg  Med Peak E' Vel:     399.0 cm/sec  10.3 cm/sec          Ao max PG:  Lat Peak E' Vel:     63.7 mmHg  6.5 cm/sec           Ao V2 mean:  E/Lat E`: 18.0       260.3 cm/sec  E/Med E`: 11.3       Ao mean PG:                       31.6 mmHg                       Ao V2 VTI:                       77.8 cm                       AVA (VTI):                       0.80 cm2           _______________________________________________________________________  AS Dimensionless     AVAi(VTI)         RV Peak E' Vel:SV index(LVOT):  Index (VTI): 0.30    cm^2/m^2:         9.4 cm/sec     28.4 ml/m2                       0.36 cm2   EKG:  EKG is ordered today. The ekg ordered today demonstrates Atrial fibrillation, rate 76 bpm  Recent Labs: 01/29/2020: BUN 75; Creatinine 8.1; Hemoglobin 10.7; Platelets 136; Potassium 4.7; Sodium  143 08/28/2020: ALT 19   Lipid Panel    Component Value Date/Time   CHOL 95 (L) 08/28/2020 0737   TRIG 155 (H) 08/28/2020 0737   HDL 30 (L) 08/28/2020 0737   CHOLHDL 3.2 08/28/2020 0737   CHOLHDL 3 10/24/2018 1014   VLDL 37.4 10/24/2018 1014   LDLCALC 39 08/28/2020 0737   LDLDIRECT 70.9 02/10/2012 0737     Wt Readings from Last 3 Encounters:  11/19/20 220 lb 6.4 oz (100 kg)  09/22/20 220 lb (99.8 kg)  08/27/20 235 lb (106.6 kg)    STS Risk Score:  Risk of Mortality: 2.385% Renal Failure: NA Permanent Stroke: 0.886% Prolonged Ventilation: 8.210% DSW Infection: 0.193% Reoperation: 2.339% Morbidity or Mortality: 13.642% Short Length of Stay: 34.397% Long Length of Stay: 4.796%   Assessment and Plan:   1. Moderate to Severe Aortic stenosis: He has a bicuspid aortic valve with progression of his valve disease.  Moderate AS by our echo in July 2022 but moderately severe AS by echo at [facility] in June 2022. He is currently undergoing workup for a kidney transplantation at [facility]. Given the severity of his aortic  stenosis, kidney transplantation cannot be completed until he has definitive therapy for his aortic stenosis. I have personally reviewed the echo images and there is restriction of the valve leaflets with moderately severe stenosis based on gradients on the echo from [facility].  I think he would benefit from AVR. He is only 60 years old but certainly at higher risk for poor outcomes with surgical aortic valve replacement. At this time I would consider him to be a candidate for surgical AVR or TAVR. We will have a better idea of approach following the remainder of his workup.    I have reviewed the natural history of aortic stenosis with the patient and their family members  who are present today. We have discussed the limitations of medical therapy and the poor prognosis associated with symptomatic aortic stenosis. We have reviewed potential treatment options, including palliative medical therapy, conventional surgical aortic valve replacement, and transcatheter aortic valve replacement. We discussed treatment options in the context of the patient's specific comorbid medical conditions.   He would like to proceed with planning for TAVR. I will arrange a right and left heart catheterization at [facility] 12/04/20. Risks and benefits of the cath procedure and the valve procedure are reviewed with the patient. After the cath, he will have a cardiac CT, CTA of the chest/abdomen and pelvis, carotid artery dopplers, PT assessment and will then be referred to see Dr. Cyndia Bent with CT surgery.  BMET and CBC today. He will hold his Eliquis two days before his cardiac cath. His cath will be from the groin in order to avoid his radial arteries which may be needed for future dialysis access in the event his transplant fails.   2. HTN: BP is well controlled. Continue current therapy  3. HLD: LDL 39 in July 2022. Continue statin.     4. Atrial fibrillation, persistent: He is in atrial fib today. Rate is controlled. Continue  Xarelto (reduced dose due to renal function).   5. CAD without angina: He has no chest pain. No beta blocker with bradycardia. Continue statin. No ASA since he is on Xarelto.   6. Carotid artery disease: He is known to have mild bilateral carotid disease by dopplers 2017. Repeat now as part of his TAVR workup  Current medicines are reviewed at length with the patient today.  The patient does not have concerns regarding medicines.  The following changes have been made:  no change  Labs/ tests ordered today include:   Orders Placed This Encounter  Procedures   CBC   Basic metabolic panel   EKG 22-PNTB     Disposition:   F/U with me after his AVR   Signed, Lauree Chandler, MD 11/19/2020 3:23 PM    [facility] [place], [place], [place]  [address] Phone: [phone]; Fax: [phone]

## 2020-11-19 NOTE — H&P (View-Only) (Signed)
Chief Complaint  Patient presents with   Follow-up    Aortic stenosis   History of Present Illness: 60 yo male with history of ESRD on PD, CAD, HTN, HLD, sinus bradycardia, bicuspid aortic valve with moderate AS, sleep apnea, thoracic aortic aneurysm, persistent atrial fibrillation here today for cardiac follow up. He was admitted with unstable angina in September 2012 and was found to have severe stenosis in the mid to distal LAD treated with a 2.25 x 18 mm Resolute DES. He was also found to have moderate mid LAD and distal RCA disease. In December 2017 he was found to have a branch retinal artery occlusion in his right eye. Echo November 2017 with LVEF=55-60%, mild AS, mildly dilated aortic root. Carotid dopplers with mild disease. Event monitor showed atrial fibrillation. He was in atrial fibrillation at time of office visit here in January 2018. He was started on Xarelto January 2018. Echo May 2021 with LVEF=60-65%. Mild LVH. Likely bicuspid aortic valve with mild aortic valve stenosis. Most recent echo July 2022 with LVEF=60-65%. Mild LVH. The aortic valve appears to be bicuspid with fused non and right cusps. The aortic valve leaflets are thickened and calcified. Mean gradient 22 mmHg. Peak gradient 36.8 mmHg, AVA 1.2 cm2, dimensionless index 0.30. This is consistent with moderate aortic stenosis. Echo at [facility] June 2022 with moderate to severe AS, normal LV function. I cannot see these images but the report is available in Care Everywhere. Mean gradient of 31.6 mmHg, AVA 0.8 cm2, dimensionless index 0.30. He has been told that he cannot have a kidney transplant at [facility] unless he has TAVR first.   He is here today to review options for treatment of his aortic stenosis. The patient denies any chest pain, dyspnea, palpitations, lower extremity edema, orthopnea, PND, dizziness, near syncope or syncope. He has no active dental issues. He sees his dentist every six months. He lives in  [place]. He is a Financial planner.   Primary Care Physician: Marin Olp, MD  Past Medical History:  Diagnosis Date   Anemia    Bicuspid aortic valve    CAD (coronary artery disease)    proximal LAD 50%, mid LAD 50-60%, mid to distal LAD 99%; mid circumflex 20%; proximal RCA 20%, distal RCA 50%; PDA 60-70%, proximal posterior AV groove 50%, proximal posterior lateral 50%.  There was a question of possible LVOT gradient;  s/p Resolute DES to mid-distal LAD 10/2010;  echocardiogram 11/17/10: EF 55-60%, mild LVH, grade 1 diastolic dysfunction, normal aortic valve, mild MR, PASP 32.    Chicken pox    CKD (chronic kidney disease), stage III ([name])    Enlarged aorta ([name])    Glaucoma    Gout    allopurinol 300mg , colchicine prn in past, has not had flares   Hypercholesterolemia    With hypertriglyceridemia   Hypertension    Kidney stones    hx hematuria as result. cystosopy normal   Mild aortic stenosis    PAF (paroxysmal atrial fibrillation) ([place])    a. Documented on event monitor 01/2016.   Premature atrial contractions    Sleep apnea    cpap nightly    Past Surgical History:  Procedure Laterality Date   CORONARY STENT PLACEMENT  2012   CYSTOSCOPY/RETROGRADE/URETEROSCOPY  03/26/2012   Procedure: CYSTOSCOPY/RETROGRADE/URETEROSCOPY;  Surgeon: [name], MD;  Location: [facility];  Service: Urology;  Laterality: Bilateral;  CYSTOSCOPY BILATERAL RETROGRADE PYELOGRAM AND POSSIBLE URETEROSCOPY  Current Outpatient Medications  Medication Sig Dispense Refill   allopurinol (ZYLOPRIM) 300 MG tablet TAKE 1 TABLET BY MOUTH EVERY DAY 90 tablet 2   amLODipine (NORVASC) 5 MG tablet TAKE 1 TABLET BY MOUTH EVERY DAY 90 tablet 1   atorvastatin (LIPITOR) 40 MG tablet TAKE 1 TABLET BY MOUTH EVERY DAY 90 tablet 2   calcitRIOL (ROCALTROL) 0.25 MCG capsule Take 2 capsules by mouth daily.     calcium acetate (PHOSLO) 667 MG capsule Take 1,334 mg by mouth 3 (three) times  daily.     ELIQUIS 2.5 MG TABS tablet TAKE 1 TABLET BY MOUTH TWICE A DAY 60 tablet 5   labetalol (NORMODYNE) 100 MG tablet Take 100 mg by mouth 2 (two) times daily.     Methoxy PEG-Epoetin Beta (MIRCERA IJ) Inject into the skin as needed.     torsemide (DEMADEX) 100 MG tablet Take 100 mg by mouth 2 (two) times daily.     No current facility-administered medications for this visit.    No Known Allergies  Social History   Socioeconomic History   Marital status: Married    Spouse name: Not on file   Number of children: 4   Years of education: Not on file   Highest education level: Not on file  Occupational History   Occupation: Lawyer: [employer]  Tobacco Use   Smoking status: Never   Smokeless tobacco: Never  Vaping Use   Vaping Use: Never used  Substance and Sexual Activity   Alcohol use: No   Drug use: No   Sexual activity: Not on file  Other Topics Concern   Not on file  Social History Narrative   Family: Married, 4 children, 2 grandkids- both with red hair (30 year old in 2016)      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)   BS at [employer]: reading, church activities- [employer]   Social Determinants of Health   Financial Resource Strain: Not on file  Food Insecurity: Not on file  Transportation Needs: Not on file  Physical Activity: Not on file  Stress: Not on file  Social Connections: Not on file  Intimate Partner Violence: Not on file    Family History  Problem Relation Age of Onset   Hypertension Father    CAD Father        CABG 41s   Kidney disease Father        Stage IV   Valvular heart disease Mother        AVR July 2015   Diabetes Paternal Grandmother    Colon cancer Neg Hx     Review of Systems:  As stated in the HPI and otherwise negative.   BP 118/78   Pulse 76   Ht 5\' 8"  (1.727 m)   Wt 220 lb 6.4 oz (100 kg)   SpO2 98%    BMI 33.51 kg/m   Physical Examination: General: Well developed, well nourished, NAD  HEENT: OP clear, mucus membranes moist  SKIN: warm, dry. No rashes. Neuro: No focal deficits  Musculoskeletal: Muscle strength 5/5 all ext  Psychiatric: Mood and affect normal  Neck: No JVD, no carotid bruits, no thyromegaly, no lymphadenopathy.  Lungs:Clear bilaterally, no wheezes, rhonci, crackles Cardiovascular: Regular rate and rhythm. Harsh systolic murmur.  Abdomen:Soft. Bowel sounds present. Non-tender.  Extremities: No  lower extremity edema. Pulses are 2 + in the bilateral DP/PT.  Cardiac cath 11/17/10: 1. Left main coronary artery: The vessel is normal in size and free  of significant disease.  2. Left anterior descending artery: The vessel is normal in size with  disease noted throughout its course. Proximally, just before the  first septal branch, there is 50% stenosis. In the mid segment  right after second diagonal, there is another 50%-60% eccentric  lesion. In the mid-to-distal LAD before the third diagonal, there  is a 99% tubular stenosis. First diagonal is overall small in size  and free of significant disease. Second diagonal is normal in size  and free of significant disease. Third diagonal is very small in  size.  3. Left circumflex artery: The vessel is normal in size but  nondominant. It has minor irregularities without obstructive  disease. There is a 20% lesion in the mid segment right after OM- 1.  4. Right coronary artery: The vessel is very large in size and  dominant. There is a 20% proximal stenosis. There is minor  irregularities in the mid segment. Distally, right at PDA and the  AV groove bifurcation, there is a 50% stenosis. The PDA itself is  medium in size and diffusely diseased throughout its course with  60%-70% disease noted with plaque formation. The distal area is  too small. The posterior AV groove has a 50% proximal lesion and  it gives a large  posterolateral branch which has a 50% proximal  stenosis. The second posterolateral branch is small in size and  free of significant disease.  Echo July 2022:  1. Left ventricular ejection fraction, by estimation, is 60 to 65%. The  left ventricle has normal function. The left ventricle has no regional  wall motion abnormalities. There is mild left ventricular hypertrophy.  Left ventricular diastolic parameters  are indeterminate.   2. Right ventricular systolic function is normal. The right ventricular  size is normal.   3. Left atrial size was moderately dilated.   4. The mitral valve is abnormal. Trivial mitral valve regurgitation. No  evidence of mitral stenosis.   5. Since study 07/01/19 mean gradient increased from 15->22.2 mmHg peak  25.8 -> 36.8 mmhg Better viewed on this study likely bicuspid with  raphe/fused non and right cusps . The aortic valve is normal in structure.  There is moderate calcification of the  aortic valve. There is moderate thickening of the aortic valve. Aortic  valve regurgitation is not visualized. Moderate aortic valve stenosis.   6. Aortic dilatation noted. There is mild dilatation of the ascending  aorta, measuring 42 mm.   7. The inferior vena cava is normal in size with greater than 50%  respiratory variability, suggesting right atrial pressure of 3 mmHg.   FINDINGS   Left Ventricle: Left ventricular ejection fraction, by estimation, is 60  to 65%. The left ventricle has normal function. The left ventricle has no  regional wall motion abnormalities. The left ventricular internal cavity  size was normal in size. There is   mild left ventricular hypertrophy. Left ventricular diastolic parameters  are indeterminate.   Right Ventricle: The right ventricular size is normal. No increase in  right ventricular wall thickness. Right ventricular systolic function is  normal.   Left Atrium: Left atrial size was moderately dilated.   Right Atrium: Right  atrial size was normal in size.   Pericardium: There is no evidence of pericardial effusion.   Mitral Valve: The mitral  valve is abnormal. There is mild thickening of  the mitral valve leaflet(s). There is mild calcification of the mitral  valve leaflet(s). Mild mitral annular calcification. Trivial mitral valve  regurgitation. No evidence of mitral  valve stenosis.   Tricuspid Valve: The tricuspid valve is normal in structure. Tricuspid  valve regurgitation is trivial. No evidence of tricuspid stenosis.   Aortic Valve: Since study 07/01/19 mean gradient increased from 15->22.2  mmHg peak 25.8 -> 36.8 mmhg Better viewed on this study likely bicuspid  with raphe/fused non and right cusps. The aortic valve is normal in  structure. There is moderate calcification  of the aortic valve. There is moderate thickening of the aortic valve.  Aortic valve regurgitation is not visualized. Moderate aortic stenosis is  present. Aortic valve mean gradient measures 22.2 mmHg. Aortic valve peak  gradient measures 36.8 mmHg. Aortic   valve area, by VTI measures 1.27 cm.   Pulmonic Valve: The pulmonic valve was normal in structure. Pulmonic valve  regurgitation is not visualized. No evidence of pulmonic stenosis.   Aorta: Aortic dilatation noted. There is mild dilatation of the ascending  aorta, measuring 42 mm.   Venous: The inferior vena cava is normal in size with greater than 50%  respiratory variability, suggesting right atrial pressure of 3 mmHg.   IAS/Shunts: No atrial level shunt detected by color flow Doppler.      LEFT VENTRICLE  PLAX 2D  LVIDd:         3.90 cm  Diastology  LVIDs:         2.60 cm  LV e' medial:    9.86 cm/s  LV PW:         1.20 cm  LV E/e' medial:  10.1  LV IVS:        1.00 cm  LV e' lateral:   13.47 cm/s  LVOT diam:     2.30 cm  LV E/e' lateral: 7.4  LV SV:         78  LV SV Index:   37  LVOT Area:     4.15 cm      RIGHT VENTRICLE  RV Basal diam:  4.60 cm  RV  S prime:     13.00 cm/s  TAPSE (M-mode): 2.4 cm   LEFT ATRIUM             Index       RIGHT ATRIUM           Index  LA diam:        4.70 cm 2.21 cm/m  RA Area:     14.90 cm  LA Vol (A2C):   52.9 ml 24.86 ml/m RA Volume:   42.90 ml  20.16 ml/m  LA Vol (A4C):   89.3 ml 41.96 ml/m  LA Biplane Vol: 75.3 ml 35.38 ml/m   AORTIC VALVE  AV Area (Vmax):    1.30 cm  AV Area (Vmean):   1.20 cm  AV Area (VTI):     1.27 cm  AV Vmax:           303.40 cm/s  AV Vmean:          223.000 cm/s  AV VTI:            0.615 m  AV Peak Grad:      36.8 mmHg  AV Mean Grad:      22.2 mmHg  LVOT Vmax:         95.05 cm/s  LVOT  Vmean:        64.600 cm/s  LVOT VTI:          0.188 m  LVOT/AV VTI ratio: 0.30     AORTA  Ao Root diam: 3.10 cm  Ao Asc diam:  4.20 cm   MV E velocity: 99.07 cm/s                             SHUNTS                             Systemic VTI:  0.19 m                             Systemic Diam: 2.30 cm   Echo [facility] 08/25/20:  SUMMARY  The left ventricular size is normal.  There is moderate concentric left ventricular hypertrophy.  Left ventricular systolic function is normal.  LV ejection fraction = 60-65%.  The left ventricular wall motion is normal.  The right ventricle is normal size.  The right ventricular systolic function is normal.  There is moderate to severe aortic stenosis. The peak velocity in most  views was around 3.5 m/sec, but in limited views, it rose to 4.0  m/sec.  The aortic sinus is normal size.  Mildly dilated ascending aorta (4.2 cm)  IVC size was normal.  There is no pericardial effusion.  No old study.   -  FINDINGS:  LEFT VENTRICLE  The left ventricular size is normal. There is moderate concentric left  ventricular hypertrophy. Left ventricular systolic function is normal.  LV ejection fraction = 60-65%. Left ventricular filling pattern is  indeterminate. The left ventricular wall motion is normal.   -  RIGHT VENTRICLE  The right ventricle  is normal size. The right ventricular systolic  function is normal. The right ventricular wall motion is normal.   LEFT ATRIUM  The left atrium is mildly to moderately dilated.   RIGHT ATRIUM  Right atrial size is normal.  -  AORTIC VALVE  Aortic valve calcification. There is moderate to severe aortic  stenosis.  -  MITRAL VALVE  The mitral valve leaflets appear normal. There is no mitral  regurgitation noted.  -  TRICUSPID VALVE  Structurally normal tricuspid valve. No tricuspid regurgitation.  -  PULMONIC VALVE  The pulmonic valve is not well visualized. There is no pulmonic  valvular regurgitation.  -  ARTERIES  The aortic sinus is normal size. Mildly dilated ascending aorta.  -  VENOUS  IVC size was normal.  -  EFFUSION  There is no pericardial effusion. There is no pleural effusion.  -  -   MMode/2D Measurements & Calculations  IVSd: 1.7 cm     LA diam: 5.5 cm ESV(MOD-sp4):     Ao sinus diam:  LVIDd: 4.0 cm    RVDd: 4.3 cm    30.3 ml           3.2 cm  LVPWd: 1.5 cm    EDV(MOD-sp4):   EDV(MOD-sp2):  LVIDs: 2.7 cm    84.3 ml         60.7 ml                                   ESV(MOD-sp2):  22.5 ml          _______________________________________________________________________  asc Aorta Diam:  LVOT diam:      SV(MOD-sp4):      IVC 1: 2.4 cm  4.2 cm           1.8 cm          54.0 ml                                   SI(MOD-sp4):                                   24.7 ml/m2          _______________________________________________________________________  LA area A2:      LA area A4:     LA vol: 104.8 ml  LA vol index:  27.8 cm2         31.0 cm2                          47.9 ml/m2           _______________________________________________________________________  RA area A4:   15.0 cm2   Doppler Measurements & Calculations  MV E max vel:        SV(LVOT): 62.0 ml LV V1 VTI:     PA max PG:  116.0 cm/sec         Ao V2 max:         23.7 cm        6.3 mmHg  Med Peak E' Vel:     399.0 cm/sec  10.3 cm/sec          Ao max PG:  Lat Peak E' Vel:     63.7 mmHg  6.5 cm/sec           Ao V2 mean:  E/Lat E`: 18.0       260.3 cm/sec  E/Med E`: 11.3       Ao mean PG:                       31.6 mmHg                       Ao V2 VTI:                       77.8 cm                       AVA (VTI):                       0.80 cm2           _______________________________________________________________________  AS Dimensionless     AVAi(VTI)         RV Peak E' Vel:SV index(LVOT):  Index (VTI): 0.30    cm^2/m^2:         9.4 cm/sec     28.4 ml/m2                       0.36 cm2   EKG:  EKG is ordered today. The ekg ordered today demonstrates Atrial fibrillation, rate 76 bpm  Recent Labs: 01/29/2020: BUN 75; Creatinine 8.1; Hemoglobin 10.7; Platelets 136; Potassium 4.7; Sodium  143 08/28/2020: ALT 19   Lipid Panel    Component Value Date/Time   CHOL 95 (L) 08/28/2020 0737   TRIG 155 (H) 08/28/2020 0737   HDL 30 (L) 08/28/2020 0737   CHOLHDL 3.2 08/28/2020 0737   CHOLHDL 3 10/24/2018 1014   VLDL 37.4 10/24/2018 1014   LDLCALC 39 08/28/2020 0737   LDLDIRECT 70.9 02/10/2012 0737     Wt Readings from Last 3 Encounters:  11/19/20 220 lb 6.4 oz (100 kg)  09/22/20 220 lb (99.8 kg)  08/27/20 235 lb (106.6 kg)    STS Risk Score:  Risk of Mortality: 2.385% Renal Failure: NA Permanent Stroke: 0.886% Prolonged Ventilation: 8.210% DSW Infection: 0.193% Reoperation: 2.339% Morbidity or Mortality: 13.642% Short Length of Stay: 34.397% Long Length of Stay: 4.796%   Assessment and Plan:   1. Moderate to Severe Aortic stenosis: He has a bicuspid aortic valve with progression of his valve disease.  Moderate AS by our echo in July 2022 but moderately severe AS by echo at [facility] in June 2022. He is currently undergoing workup for a kidney transplantation at [facility]. Given the severity of his aortic  stenosis, kidney transplantation cannot be completed until he has definitive therapy for his aortic stenosis. I have personally reviewed the echo images and there is restriction of the valve leaflets with moderately severe stenosis based on gradients on the echo from [facility].  I think he would benefit from AVR. He is only 60 years old but certainly at higher risk for poor outcomes with surgical aortic valve replacement. At this time I would consider him to be a candidate for surgical AVR or TAVR. We will have a better idea of approach following the remainder of his workup.    I have reviewed the natural history of aortic stenosis with the patient and their family members  who are present today. We have discussed the limitations of medical therapy and the poor prognosis associated with symptomatic aortic stenosis. We have reviewed potential treatment options, including palliative medical therapy, conventional surgical aortic valve replacement, and transcatheter aortic valve replacement. We discussed treatment options in the context of the patient's specific comorbid medical conditions.   He would like to proceed with planning for TAVR. I will arrange a right and left heart catheterization at [facility] 12/04/20. Risks and benefits of the cath procedure and the valve procedure are reviewed with the patient. After the cath, he will have a cardiac CT, CTA of the chest/abdomen and pelvis, carotid artery dopplers, PT assessment and will then be referred to see Dr. Cyndia Bent with CT surgery.  BMET and CBC today. He will hold his Eliquis two days before his cardiac cath. His cath will be from the groin in order to avoid his radial arteries which may be needed for future dialysis access in the event his transplant fails.   2. HTN: BP is well controlled. Continue current therapy  3. HLD: LDL 39 in July 2022. Continue statin.     4. Atrial fibrillation, persistent: He is in atrial fib today. Rate is controlled. Continue  Xarelto (reduced dose due to renal function).   5. CAD without angina: He has no chest pain. No beta blocker with bradycardia. Continue statin. No ASA since he is on Xarelto.   6. Carotid artery disease: He is known to have mild bilateral carotid disease by dopplers 2017. Repeat now as part of his TAVR workup  Current medicines are reviewed at length with the patient today.  The patient does not have concerns regarding medicines.  The following changes have been made:  no change  Labs/ tests ordered today include:   Orders Placed This Encounter  Procedures   CBC   Basic metabolic panel   EKG [id number]-MBBU     Disposition:   F/U with me after his AVR   Signed, Lauree Chandler, MD 11/19/2020 3:23 PM    [facility] [place], [place], [place]  [address] Phone: [phone]; Fax: [phone]

## 2020-11-19 NOTE — Patient Instructions (Signed)
Medication Instructions:  No changes *If you need a refill on your cardiac medications before your next appointment, please call your pharmacy*   Lab Work: Today: BMET, CBC   Testing/Procedures: Your physician has requested that you have a cardiac catheterization. Cardiac catheterization is used to diagnose and/or treat various heart conditions. Doctors may recommend this procedure for a number of different reasons. The most common reason is to evaluate chest pain. Chest pain can be a symptom of coronary artery disease (CAD), and cardiac catheterization can show whether plaque is narrowing or blocking your heart's arteries. This procedure is also used to evaluate the valves, as well as measure the blood flow and oxygen levels in different parts of your heart. For further information please visit [facility]. Please follow instruction sheet, as given.   Follow-Up per Structural Heart Valve Team  Other Instructions  [place] OFFICE [place], [place] [address] Dept: [phone] Loc: [phone]  Daniel Solomon  11/19/2020  You are scheduled for a Cardiac Catheterization on Friday, October 7 with Dr. Lauree Chandler.  1. Please arrive at the [facility] (Main Entrance A) at [facility]: [address] [place], [place] [address] at 7:00 AM (This time is two hours before your procedure to ensure your preparation). Free valet parking service is available.   Special note: Every effort is made to have your procedure done on time. Please understand that emergencies sometimes delay scheduled procedures.  2. Diet: Do not eat solid foods after midnight.  The patient may have clear liquids until 5am upon the day of the procedure.  3. Labs: TODAY 11/19/20 --BMET, CBC  4. Medication instructions in preparation for your procedure:   Contrast Allergy: No  Do not take any  Eliquis after Tuesday October 4.  You will be instructed on when to restart.    On the morning of your procedure, take your Aspirin 81 mg and any morning medicines NOT listed above.  You may use sips of water.  5. Plan for one night stay--bring personal belongings. 6. Bring a current list of your medications and current insurance cards. 7. You MUST have a responsible person to drive you home. 8. Someone MUST be with you the first 24 hours after you arrive home or your discharge will be delayed. 9. Please wear clothes that are easy to get on and off and wear slip-on shoes.  Thank you for allowing [place] to care for you!   -- [facility]

## 2020-11-20 LAB — BASIC METABOLIC PANEL
BUN/Creatinine Ratio: 5 — ABNORMAL LOW (ref 9–20)
BUN: 64 mg/dL — ABNORMAL HIGH (ref 6–24)
CO2: 22 mmol/L (ref 20–29)
Calcium: 10.3 mg/dL — ABNORMAL HIGH (ref 8.7–10.2)
Chloride: 95 mmol/L — ABNORMAL LOW (ref 96–106)
Creatinine, Ser: 12.68 mg/dL — ABNORMAL HIGH (ref 0.76–1.27)
Glucose: 84 mg/dL (ref 65–99)
Potassium: 4.3 mmol/L (ref 3.5–5.2)
Sodium: 140 mmol/L (ref 134–144)
eGFR: 4 mL/min/{1.73_m2} — ABNORMAL LOW (ref >59)

## 2020-11-20 LAB — CBC
Hematocrit: 30.3 % — ABNORMAL LOW (ref 37.5–51.0)
Hemoglobin: 10.3 g/dL — ABNORMAL LOW (ref 13.0–17.7)
MCH: 33.7 pg — ABNORMAL HIGH (ref 26.6–33.0)
MCHC: 34 g/dL (ref 31.5–35.7)
MCV: 99 fL — ABNORMAL HIGH (ref 79–97)
Platelets: 205 10*3/uL (ref 150–450)
RBC: 3.06 x10E6/uL — ABNORMAL LOW (ref 4.14–5.80)
RDW: 14.7 % (ref 11.6–15.4)
WBC: 7.2 10*3/uL (ref 3.4–10.8)

## 2020-12-01 ENCOUNTER — Other Ambulatory Visit: Payer: Self-pay | Admitting: Cardiovascular Disease

## 2020-12-01 DIAGNOSIS — I480 Paroxysmal atrial fibrillation: Secondary | ICD-10-CM

## 2020-12-01 NOTE — Telephone Encounter (Signed)
Eliquis 2.5mg  refill request received. Patient is 60 years old, weight-100kg, Crea-12.68 on 11/19/2020, Diagnosis-Afib, and last seen by Dr. Angelena Form on 11/19/2020. Dose inappropiate per dosing criteria, however, dose has been adjusted per Dr. Angelena Form note "reduced dose due to renal function". Pt was switched to Eliquis per [facility] pharmacist on 11/18/2019 due to CKD safety. Will send in refill to requested pharmacy.  [name]

## 2020-12-03 ENCOUNTER — Telehealth: Payer: Self-pay | Admitting: *Deleted

## 2020-12-03 NOTE — Telephone Encounter (Signed)
Cardiac catheterization scheduled at [facility] for: Friday December 04, 2020 [facility] Main Entrance A [facility]) at: 7 AM   No solid food after midnight prior to cath, clear liquids until 5 AM day of procedure.  Medication instructions: Hold: Eliquis-none 12/02/20 until post procedure Torsemide-AM of procedure  Except hold medications usual morning medications can be taken pre-cath with sips of water including aspirin 81 mg.    Confirmed patient has responsible adult to drive home post procedure and be with patient first 24 hours after arriving home.  [facility] does allow one visitor to accompany you and wait in the hospital waiting room while you are there for your procedure. You and your visitor will be asked to wear a mask once you enter the hospital.   Patient reports does not currently have any symptoms concerning for COVID-19 and no household members with COVID-19 like illness.      Reviewed procedure /mask/visitor instructions with patient.

## 2020-12-04 ENCOUNTER — Ambulatory Visit (HOSPITAL_COMMUNITY)
Admission: RE | Admit: 2020-12-04 | Discharge: 2020-12-04 | Disposition: A | Discharge status: Home / Self Care | Payer: Managed Care, Other (non HMO) | Source: Ambulatory Visit | Attending: Cardiovascular Disease | Admitting: Cardiovascular Disease

## 2020-12-04 ENCOUNTER — Other Ambulatory Visit: Payer: Self-pay

## 2020-12-04 ENCOUNTER — Encounter: Payer: Self-pay | Admitting: Physician Assistant

## 2020-12-04 ENCOUNTER — Encounter (HOSPITAL_COMMUNITY): Payer: Self-pay | Admitting: Cardiovascular Disease

## 2020-12-04 ENCOUNTER — Encounter (HOSPITAL_COMMUNITY)
Admission: RE | Disposition: A | Discharge status: Home / Self Care | Payer: Self-pay | Source: Ambulatory Visit | Attending: Cardiovascular Disease

## 2020-12-04 DIAGNOSIS — I779 Disorder of arteries and arterioles, unspecified: Secondary | ICD-10-CM | POA: Diagnosis not present

## 2020-12-04 DIAGNOSIS — I120 Hypertensive chronic kidney disease with stage 5 chronic kidney disease or end stage renal disease: Secondary | ICD-10-CM | POA: Diagnosis not present

## 2020-12-04 DIAGNOSIS — I350 Nonrheumatic aortic (valve) stenosis: Secondary | ICD-10-CM | POA: Diagnosis not present

## 2020-12-04 DIAGNOSIS — Z79899 Other long term (current) drug therapy: Secondary | ICD-10-CM | POA: Insufficient documentation

## 2020-12-04 DIAGNOSIS — Z7901 Long term (current) use of anticoagulants: Secondary | ICD-10-CM | POA: Insufficient documentation

## 2020-12-04 DIAGNOSIS — Z955 Presence of coronary angioplasty implant and graft: Secondary | ICD-10-CM | POA: Insufficient documentation

## 2020-12-04 DIAGNOSIS — Z992 Dependence on renal dialysis: Secondary | ICD-10-CM | POA: Diagnosis not present

## 2020-12-04 DIAGNOSIS — Z8249 Family history of ischemic heart disease and other diseases of the circulatory system: Secondary | ICD-10-CM | POA: Diagnosis not present

## 2020-12-04 DIAGNOSIS — N186 End stage renal disease: Secondary | ICD-10-CM | POA: Diagnosis not present

## 2020-12-04 DIAGNOSIS — I4819 Other persistent atrial fibrillation: Secondary | ICD-10-CM | POA: Insufficient documentation

## 2020-12-04 DIAGNOSIS — I2510 Atherosclerotic heart disease of native coronary artery without angina pectoris: Secondary | ICD-10-CM

## 2020-12-04 HISTORY — PX: RIGHT HEART CATH AND CORONARY ANGIOGRAPHY: CATH118264

## 2020-12-04 LAB — POCT I-STAT 7, (LYTES, BLD GAS, ICA,H+H)
Acid-Base Excess: 2 mmol/L (ref 0.0–2.0)
Bicarbonate: 28.1 mmol/L — ABNORMAL HIGH (ref 20.0–28.0)
Calcium, Ion: 1.45 mmol/L — ABNORMAL HIGH (ref 1.15–1.40)
HCT: 30 % — ABNORMAL LOW (ref 39.0–52.0)
Hemoglobin: 10.2 g/dL — ABNORMAL LOW (ref 13.0–17.0)
O2 Saturation: 99 %
Potassium: 3.6 mmol/L (ref 3.5–5.1)
Sodium: 140 mmol/L (ref 135–145)
TCO2: 30 mmol/L (ref 22–32)
pCO2 arterial: 48.2 mmHg — ABNORMAL HIGH (ref 32.0–48.0)
pH, Arterial: 7.374 (ref 7.350–7.450)
pO2, Arterial: 143 mmHg — ABNORMAL HIGH (ref 83.0–108.0)

## 2020-12-04 LAB — POCT I-STAT EG7
Acid-base deficit: 1 mmol/L (ref 0.0–2.0)
Bicarbonate: 23.9 mmol/L (ref 20.0–28.0)
Calcium, Ion: 1.09 mmol/L — ABNORMAL LOW (ref 1.15–1.40)
HCT: 26 % — ABNORMAL LOW (ref 39.0–52.0)
Hemoglobin: 8.8 g/dL — ABNORMAL LOW (ref 13.0–17.0)
O2 Saturation: 75 %
Potassium: 2.8 mmol/L — ABNORMAL LOW (ref 3.5–5.1)
Sodium: 147 mmol/L — ABNORMAL HIGH (ref 135–145)
TCO2: 25 mmol/L (ref 22–32)
pCO2, Ven: 42 mmHg — ABNORMAL LOW (ref 44.0–60.0)
pH, Ven: 7.364 (ref 7.250–7.430)
pO2, Ven: 42 mmHg (ref 32.0–45.0)

## 2020-12-04 SURGERY — RIGHT HEART CATH AND CORONARY ANGIOGRAPHY
Anesthesia: LOCAL

## 2020-12-04 MED ORDER — MIDAZOLAM HCL 2 MG/2ML IJ SOLN
INTRAMUSCULAR | Status: DC | PRN
  Administered 2020-12-04 (×2): 1 mg via INTRAVENOUS

## 2020-12-04 MED ORDER — SODIUM CHLORIDE 0.9% FLUSH: 3.0000 mL | Freq: Two times a day (BID) | INTRAVENOUS | Status: DC

## 2020-12-04 MED ORDER — VERAPAMIL HCL 2.5 MG/ML IV SOLN
INTRAVENOUS | Status: AC
  Filled 2020-12-04: qty 2

## 2020-12-04 MED ORDER — SODIUM CHLORIDE 0.9 % IV SOLN: 250.0000 mL | INTRAVENOUS | Status: DC | PRN

## 2020-12-04 MED ORDER — MIDAZOLAM HCL 2 MG/2ML IJ SOLN
INTRAMUSCULAR | Status: AC
  Filled 2020-12-04: qty 2

## 2020-12-04 MED ORDER — FENTANYL CITRATE (PF) 100 MCG/2ML IJ SOLN
INTRAMUSCULAR | Status: DC | PRN
  Administered 2020-12-04 (×2): 25 ug via INTRAVENOUS

## 2020-12-04 MED ORDER — HYDRALAZINE HCL 20 MG/ML IJ SOLN: 10.0000 mg | INTRAMUSCULAR | Status: DC | PRN

## 2020-12-04 MED ORDER — SODIUM CHLORIDE 0.9% FLUSH: 3.0000 mL | INTRAVENOUS | Status: DC | PRN

## 2020-12-04 MED ORDER — LABETALOL HCL 5 MG/ML IV SOLN: 10.0000 mg | INTRAVENOUS | Status: DC | PRN

## 2020-12-04 MED ORDER — IOHEXOL 350 MG/ML SOLN
INTRAVENOUS | Status: DC | PRN
  Administered 2020-12-04: 70 mL

## 2020-12-04 MED ORDER — HEPARIN (PORCINE) IN NACL 1000-0.9 UT/500ML-% IV SOLN
INTRAVENOUS | Status: DC | PRN
  Administered 2020-12-04 (×2): 500 mL

## 2020-12-04 MED ORDER — SODIUM CHLORIDE 0.9 % IV SOLN: INTRAVENOUS | Status: DC

## 2020-12-04 MED ORDER — ONDANSETRON HCL 4 MG/2ML IJ SOLN: 4.0000 mg | Freq: Four times a day (QID) | INTRAMUSCULAR | Status: DC | PRN

## 2020-12-04 MED ORDER — FENTANYL CITRATE (PF) 100 MCG/2ML IJ SOLN
INTRAMUSCULAR | Status: AC
  Filled 2020-12-04: qty 2

## 2020-12-04 MED ORDER — HEPARIN SODIUM (PORCINE) 1000 UNIT/ML IJ SOLN
INTRAMUSCULAR | Status: AC
  Filled 2020-12-04: qty 1

## 2020-12-04 MED ORDER — LIDOCAINE HCL (PF) 1 % IJ SOLN
INTRAMUSCULAR | Status: DC | PRN
  Administered 2020-12-04: 10 mL
  Administered 2020-12-04: 2 mL

## 2020-12-04 MED ORDER — HEPARIN (PORCINE) IN NACL 1000-0.9 UT/500ML-% IV SOLN
INTRAVENOUS | Status: AC
  Filled 2020-12-04: qty 1000

## 2020-12-04 MED ORDER — LIDOCAINE HCL (PF) 1 % IJ SOLN
INTRAMUSCULAR | Status: AC
  Filled 2020-12-04: qty 30

## 2020-12-04 MED ORDER — ACETAMINOPHEN 325 MG PO TABS: 650.0000 mg | ORAL_TABLET | ORAL | Status: DC | PRN

## 2020-12-04 MED ORDER — ASPIRIN 81 MG PO CHEW
81.0000 mg | CHEWABLE_TABLET | ORAL | Status: DC
Start: 2020-12-04 — End: 2020-12-04

## 2020-12-04 SURGICAL SUPPLY — 15 items
CATH BALLN WEDGE 5F 110CM (CATHETERS) ×2 IMPLANT
CATH INFINITI 5 FR 3DRC (CATHETERS) ×1 IMPLANT
CATH INFINITI 5 FR AL2 (CATHETERS) ×1 IMPLANT
CATH INFINITI 5FR MULTPACK ANG (CATHETERS) ×1 IMPLANT
CLOSURE MYNX CONTROL 5F (Vascular Products) ×1 IMPLANT
KIT HEART LEFT (KITS) ×2 IMPLANT
PACK CARDIAC CATHETERIZATION (CUSTOM PROCEDURE TRAY) ×2 IMPLANT
SHEATH GLIDE SLENDER 4/5FR (SHEATH) ×1 IMPLANT
SHEATH PINNACLE 5F 10CM (SHEATH) ×1 IMPLANT
SHEATH PROBE COVER 6X72 (BAG) ×1 IMPLANT
TRANSDUCER W/STOPCOCK (MISCELLANEOUS) ×2 IMPLANT
TUBING CIL FLEX 10 FLL-RA (TUBING) ×2 IMPLANT
WIRE EMERALD 3MM-J .035X150CM (WIRE) ×1 IMPLANT
WIRE EMERALD ST .035X150CM (WIRE) ×1 IMPLANT
WIRE MICRO SET SILHO 5FR 7 (SHEATH) ×1 IMPLANT

## 2020-12-04 NOTE — Progress Notes (Signed)
Pt ambulated without difficulty or bleeding.   Discharged home with his wife who will drive and stay with pt x 24 hrs. 

## 2020-12-04 NOTE — Interval H&P Note (Signed)
History and Physical Interval Note:  12/04/2020 8:57 AM  [name]  has presented today for surgery, with the diagnosis of aortic stenosis.  The various methods of treatment have been discussed with the patient and family. After consideration of risks, benefits and other options for treatment, the patient has consented to  Procedure(s): RIGHT/LEFT HEART CATH AND CORONARY ANGIOGRAPHY (N/A) as a surgical intervention.  The patient's history has been reviewed, patient examined, no change in status, stable for surgery.  I have reviewed the patient's chart and labs.  Questions were answered to the patient's satisfaction.    Cath Lab Visit (complete for each Cath Lab visit)  Clinical Evaluation Leading to the Procedure:   ACS: No.  Non-ACS:    Anginal Classification: No Symptoms  Anti-ischemic medical therapy: Maximal Therapy (2 or more classes of medications)  Non-Invasive Test Results: No non-invasive testing performed  Prior CABG: No previous CABG        Lauree Chandler

## 2020-12-04 NOTE — Discharge Instructions (Addendum)
Resume Eliquis tomorrow if no bleeding from either cath site.

## 2020-12-07 ENCOUNTER — Encounter: Payer: Self-pay | Admitting: Cardiology

## 2020-12-07 ENCOUNTER — Other Ambulatory Visit: Payer: Self-pay | Admitting: Cardiology

## 2020-12-07 DIAGNOSIS — I350 Nonrheumatic aortic (valve) stenosis: Secondary | ICD-10-CM

## 2020-12-07 NOTE — Progress Notes (Signed)
  [facility]ENTER   MULTIDISCIPLINARY HEART VALVE TEAM   Pre-TAVR Testing:  You are scheduled for pre-TAVR CT scans  on 12/15/20 at 10:45am at [name] ([place]). Please check in at 10:15am  in Radiology (first floor). Your CT scans (of chest/abdomen/pelvis/heart) will begin at 10:45am.   Please follow these instructions carefully:  Hold all erectile dysfunction medications at least 3 days (72 hrs) prior to test.  On the Night Before the Test: Be sure to Drink plenty of water. Do not consume any caffeinated/decaffeinated beverages or chocolate 12 hours prior to your test. Do not take any antihistamines 12 hours prior to your test.  On the Day of the Test: Drink plenty of water until 1 hour prior to the test (after 9:45am) Do not eat any food 4 hours prior to the test (after 6:45am) You may take your regular medications prior to the test.  Take Labetalol two hours prior to test.  After the Test: Drink plenty of water. After receiving IV contrast, you may experience a mild flushed feeling. This is normal. On occasion, you may experience a mild rash up to 24 hours after the test. This is not dangerous. If this occurs, you can take Benadryl 25 mg and increase your fluid intake. If you experience trouble breathing, this can be serious. If it is severe call 911 IMMEDIATELY. If it is mild, please call our office.   Follow-Up: You have been referred to Dr. Cyndia Bent at [facility] (located in the [place]) for further TAVR evaluation.  [address]  [place], [place] [address] Phone: [phone]   You are scheduled for surgical evaluation with Dr. Cyndia Bent on 01/13/21 at 0930.  Please call the office on the day of your appointment to make sure the surgeon will be there and on time as many times there are delays or rescheduling due to emergency surgery.   If you have any questions or concerns, please do not hesitate  to call.

## 2020-12-15 ENCOUNTER — Ambulatory Visit (HOSPITAL_COMMUNITY): Payer: Managed Care, Other (non HMO)

## 2020-12-22 ENCOUNTER — Other Ambulatory Visit: Payer: Self-pay

## 2020-12-22 ENCOUNTER — Ambulatory Visit (HOSPITAL_COMMUNITY)
Admission: RE | Admit: 2020-12-22 | Discharge: 2020-12-22 | Disposition: A | Discharge status: Home / Self Care | Payer: Managed Care, Other (non HMO) | Source: Ambulatory Visit | Attending: Cardiology | Admitting: Cardiology

## 2020-12-22 ENCOUNTER — Encounter (HOSPITAL_COMMUNITY): Payer: Self-pay

## 2020-12-22 ENCOUNTER — Ambulatory Visit (HOSPITAL_COMMUNITY): Payer: Managed Care, Other (non HMO)

## 2020-12-22 DIAGNOSIS — I350 Nonrheumatic aortic (valve) stenosis: Secondary | ICD-10-CM

## 2020-12-22 MED ORDER — IOHEXOL 350 MG/ML SOLN
100.0000 mL | Freq: Once | INTRAVENOUS | Status: AC | PRN
  Administered 2020-12-22: 100 mL via INTRAVENOUS

## 2020-12-28 ENCOUNTER — Telehealth: Payer: Self-pay

## 2020-12-28 DIAGNOSIS — N289 Disorder of kidney and ureter, unspecified: Secondary | ICD-10-CM

## 2020-12-28 DIAGNOSIS — R9389 Abnormal findings on diagnostic imaging of other specified body structures: Secondary | ICD-10-CM

## 2020-12-28 NOTE — Telephone Encounter (Signed)
Pre TAVR CT with incidental findings:  Enlarging exophytic intermediate attenuation lesion in the lower  pole of the left kidney. Although does may simply represent a  proteinaceous/hemorrhagic cysts, further characterization with  nonemergent MRI of the abdomen with and without IV gadolinium is  recommended in the near future to better characterize this finding  and exclude the possibility of a small renal neoplasm.   Small pulmonary nodules measuring 4 mm or less in size, nonspecific, but statistically likely benign. No follow-up needed if patient is low-risk (and has no known or suspected primary neoplasm). Non-contrast chest CT can be considered in 12 months if patient is high-risk. This recommendation follows the consensus statement: Guidelines for Management of Incidental Pulmonary Nodules Detected on CT Images: From the Fleischner Society 2017; Radiology 2017; 284:228-243. (Per pt no history of smoking)  Discussed findings with Dr Angelena Form and he advised that I reach out to transplant team to discuss renal findings and determine next steps.  I contacted Nicole Iran, [phone], and she advised that the renal finding will need to be evaluated by Urologist.  I contacted the pt to discuss these findings and he advised that this is not new and he saw a urologist last year and had an MRI and was found to have cysts.  I advised the pt that I would contact [facility] because the transplant team will require documentation that this is stable before they can continue with evaluation. Pt agreed with plan.   Per Epic the pt had an MRI 07/09/2019 that was ordered by Dr Kathie Rhodes. I contacted [facility] and they will require an office visit for the pt to undergo further evaluation of this finding to confirm that this is not a new finding and that this is a stable finding. Per the scheduler she will speak with MD and then contact the pt with an appointment. Pt is aware that [facility] will contact him to arrange appointment.

## 2020-12-29 NOTE — Telephone Encounter (Signed)
Received call from [facility] that Dr. Lovena Neighbours (the patient's new urologist- his old urologist is no longer with [facility]) reviewed case and stated the Transplant Team will need to repeat MRI to reassess for changes/stability compared to 2021 MRI. Per Dr. Jackson Latino representative, [facility] will not be performing any more studies at this time.  Requested a fax with the above information sent to the Structural Heart office.   [facility] Phone: [phone]

## 2020-12-29 NOTE — Telephone Encounter (Signed)
Urology response discussed with Dr Angelena Form .  At this time Dr Angelena Form would like to go ahead and order the MRI for pt.  This study can then be compared to the previous MRI in our system from 2021. Order placed and we will contact the pt in regards to plan and scheduling of MRI.

## 2020-12-29 NOTE — Addendum Note (Signed)
Addended by: Barkley Boards on: 12/29/2020 06:53 PM   Modules accepted: Orders

## 2020-12-30 NOTE — Telephone Encounter (Signed)
The patient is in agreement for plan to have MRI ordered by Dr. Angelena Form.  Unfortunately, the patient had an iron transfusion 54/10/8117 and per policy, he has to wait 3 months (03/24/2021) to complete MRI.  Will have scheduled per protocol and talk about the patient in the next Valve Meeting and update him.

## 2021-01-05 NOTE — Telephone Encounter (Signed)
Pt's case discussed in valve team meeting today.  At this time we will continue with aortic valve evaluation and the pt will see cardiac surgeon, Dr Cyndia Bent, on 11/16. Incidental findings will be addressed after aortic valve is treated. Pt aware of plan and verbalized understanding.

## 2021-01-08 ENCOUNTER — Other Ambulatory Visit: Payer: Self-pay | Admitting: Family Medicine

## 2021-01-13 ENCOUNTER — Other Ambulatory Visit: Payer: Self-pay

## 2021-01-13 ENCOUNTER — Institutional Professional Consult (permissible substitution) (INDEPENDENT_AMBULATORY_CARE_PROVIDER_SITE_OTHER): Payer: Managed Care, Other (non HMO) | Admitting: Surgery

## 2021-01-13 VITALS — BP 123/85 | HR 75 | Resp 20 | Ht 68.0 in | Wt 208.0 lb

## 2021-01-13 DIAGNOSIS — I350 Nonrheumatic aortic (valve) stenosis: Secondary | ICD-10-CM | POA: Diagnosis not present

## 2021-01-13 NOTE — Progress Notes (Signed)
Pre Surgical Assessment: 5 M Walk Test  24M=16.55ft  5 Meter Walk Test- trial 1: 8 seconds/ 5 Meter Walk Test- trial 2: 7.4 seconds 5 Meter Walk Test- trial 3: 6.2 seconds 5 Meter Walk Test Average: 6.7 seconds

## 2021-01-13 NOTE — Progress Notes (Signed)
Patient ID: Daniel Solomon, male   DOB: [birth year], 60 y.o.   MRN: [id number]  [facility]IP[facility] VALVE CLINIC        [address]       [place] [address]             [phone]          CARDIOTHORACIC SURGERY CONSULTATION REPORT  PCP is Marin Olp, MD Referring Provider is Lauree Chandler, MD Primary Cardiologist is Lauree Chandler, MD  Reason for consultation:  Moderate to severe aortic stenosis  HPI:  The patient is a 60 year old gentleman with a history of hypertension, hyperlipidemia, end-stage renal disease on peritoneal dialysis, coronary artery disease status post stenting of the mid to distal LAD in 2012, bicuspid aortic valve disease with moderate aortic stenosis, thoracic aortic aneurysm, persistent atrial fibrillation, and sleep apnea.  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  Carotid Dopplers showed mild disease.  An event monitor showed atrial fibrillation and he was treated with Xarelto since January 2018.  He had a follow-up echocardiogram in May 2021 showing a bicuspid aortic valve with mild aortic stenosis.  His most recent echo in July 2022 showed a bicuspid aortic valve with fused non and right cusps with leaflet thickening and calcification.  The mean gradient was 22 mmHg with a peak gradient of 37 mmHg.  Aortic valve area was 1.2 cm with a dimensionless index of 0.3.  This was felt to be consistent with moderate aortic stenosis.  He has been undergoing work-up for kidney transplantation at [facility] and an echocardiogram there in June 2022  showed moderate to severe aortic stenosis with a mean gradient of 32 mmHg and a valve area of 0.8 cm.  He was told that he cannot have a kidney transplant at [facility] until his aortic stenosis was  treated.  He is here today to discuss treatment of his aortic stenosis with TAVR.  He does report having some exertional chest discomfort and shortness of breath.  He has noted his energy level is decreased.  His wife notes that he is not as active as he had been.  Past Medical History:  Diagnosis Date   Anemia    Bicuspid aortic valve    CAD (coronary artery disease)    proximal LAD 50%, mid LAD 50-60%, mid to distal LAD 99%; mid circumflex 20%; proximal RCA 20%, distal RCA 50%; PDA 60-70%, proximal posterior AV groove 50%, proximal posterior lateral 50%.  There was a question of possible LVOT gradient;  s/p Resolute DES to mid-distal LAD 10/2010;  echocardiogram 11/17/10: EF 55-60%, mild LVH, grade 1 diastolic dysfunction, normal aortic valve, mild MR, PASP 32.    Chicken pox    Enlarged aorta ([name])    ESRD on peritoneal dialysis ([name])    Glaucoma    Gout    allopurinol $RemoveBefo'300mg'YbqpkotVhog$ , colchicine prn in past, has not had flares   Hypercholesterolemia    With hypertriglyceridemia   Hypertension    Kidney stones    hx hematuria as result. cystosopy normal   Mild aortic stenosis    PAF (paroxysmal atrial fibrillation) ([place])    a. Documented on event monitor 01/2016.   Premature atrial contractions    Sleep apnea    cpap nightly  Past Surgical History:  Procedure Laterality Date   CORONARY STENT PLACEMENT  2012   CYSTOSCOPY/RETROGRADE/URETEROSCOPY  03/26/2012   Procedure: CYSTOSCOPY/RETROGRADE/URETEROSCOPY;  Surgeon: [name], MD;  Location: [facility];  Service: Urology;  Laterality: Bilateral;  CYSTOSCOPY BILATERAL RETROGRADE PYELOGRAM AND POSSIBLE URETEROSCOPY   RIGHT HEART CATH AND CORONARY ANGIOGRAPHY N/A 12/04/2020   Procedure: RIGHT HEART CATH AND CORONARY ANGIOGRAPHY;  Surgeon: Burnell Blanks, MD;  Location: [facility];  Service: Cardiovascular;  Laterality: N/A;    Family History  Problem Relation Age of Onset   Hypertension Father    CAD  Father        CABG 58s   Kidney disease Father        Stage IV   Valvular heart disease Mother        AVR July 2015   Diabetes Paternal Grandmother    Colon cancer Neg Hx     Social History   Socioeconomic History   Marital status: Married    Spouse name: Not on file   Number of children: 4   Years of education: Not on file   Highest education level: Not on file  Occupational History   Occupation: Lawyer: [employer]  Tobacco Use   Smoking status: Never   Smokeless tobacco: Never  Vaping Use   Vaping Use: Never used  Substance and Sexual Activity   Alcohol use: No   Drug use: No   Sexual activity: Not on file  Other Topics Concern   Not on file  Social History Narrative   Family: Married, 4 children, 2 grandkids- both with red hair (15 year old in 2016)      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 Authorizing Provider  allopurinol (ZYLOPRIM) 300 MG tablet TAKE 1 TABLET BY MOUTH EVERY DAY 07/08/20  Yes Marin Olp, MD  atorvastatin (LIPITOR) 40 MG tablet TAKE 1 TABLET BY MOUTH EVERY DAY 07/08/20  Yes Marin Olp, MD  docusate sodium (COLACE) 100 MG capsule Take 200 mg by mouth daily.   Yes [provider]  ELIQUIS 2.5 MG TABS tablet TAKE 1 TABLET BY MOUTH TWICE A DAY 12/01/20  Yes Burnell Blanks, MD  iron sucrose in sodium chloride 0.9 % 100 mL Iron Sucrose (Venofer) 11/23/20  Yes [provider]  labetalol (NORMODYNE)  100 MG tablet Take 100 mg by mouth 2 (two) times daily. 04/30/20  Yes [provider]  Methoxy PEG-Epoetin Beta (MIRCERA IJ) Inject into the skin as needed. 06/15/20  Yes [provider]  torsemide (DEMADEX) 100 MG tablet Take 100 mg by mouth daily. 04/13/20  Yes [provider]  calcitRIOL (ROCALTROL) 0.25 MCG capsule Take 0.5 mcg by mouth daily. Patient not taking: Reported on 01/13/2021 04/25/18   [provider]  calcium acetate (PHOSLO) 667 MG capsule Take 2,668 mg by mouth 3 (three)  times daily. Patient not taking: Reported on 01/13/2021 05/26/20   [provider]    Current Outpatient Medications  Medication Sig Dispense Refill   allopurinol (ZYLOPRIM) 300 MG tablet TAKE 1 TABLET BY MOUTH EVERY DAY 90 tablet 2   atorvastatin (LIPITOR) 40 MG tablet TAKE 1 TABLET BY MOUTH EVERY DAY 90 tablet 2   docusate sodium (COLACE) 100 MG capsule Take 200 mg by mouth daily.     ELIQUIS 2.5 MG TABS tablet TAKE 1 TABLET BY MOUTH TWICE A DAY 60 tablet 5   iron sucrose in sodium chloride 0.9 % 100 mL Iron Sucrose (Venofer)     labetalol (NORMODYNE) 100 MG tablet Take 100 mg by mouth 2 (two) times daily.     Methoxy PEG-Epoetin Beta (MIRCERA IJ) Inject into the skin as needed.     torsemide (DEMADEX) 100 MG tablet Take 100 mg by mouth daily.     calcitRIOL (ROCALTROL) 0.25 MCG capsule Take 0.5 mcg by mouth daily. (Patient not taking: Reported on 01/13/2021)     calcium acetate (PHOSLO) 667 MG capsule Take 2,668 mg by mouth 3 (three) times daily. (Patient not taking: Reported on 01/13/2021)     No current facility-administered medications for this visit.    No Known Allergies    Review of Systems:   General:  normal appetite, + decreased energy, no weight gain, no weight loss, no fever  Cardiac:  + chest pain with exertion, no chest pain at rest, +SOB with moderate exertion, no resting SOB, no PND, no orthopnea, no palpitations, + arrhythmia, + atrial  fibrillation, no LE edema, no dizzy spells, no syncope  Respiratory:  + exertional shortness of breath, no home oxygen, no productive cough, no dry cough, no bronchitis, no wheezing, no hemoptysis, no asthma, no pain with inspiration or cough, no sleep apnea, no CPAP at night  GI:   no difficulty swallowing, no reflux, no frequent heartburn, no hiatal hernia, no abdominal pain, no constipation, no diarrhea, no hematochezia, no hematemesis, no melena  GU:   no dysuria,  no frequency, no urinary tract infection, no hematuria, no enlarged prostate, no kidney stones, + kidney disease  Vascular:  no pain suggestive of claudication, no pain in feet, + leg cramps, no varicose veins, no DVT, no non-healing foot ulcer  Neuro:   no stroke, no TIA's, no seizures, no headaches, no temporary blindness one eye,  no slurred speech, no peripheral neuropathy, no chronic pain, no instability of gait, no memory/cognitive dysfunction  Musculoskeletal: no arthritis, no joint swelling, no myalgias, no difficulty walking, normal mobility   Skin:   no rash, + itching, no skin infections, no pressure sores or ulcerations  Psych:   no anxiety, no depression, no nervousness, no unusual recent stress  Eyes:   no blurry vision, no floaters, no recent vision changes, + wears glasses or contacts  ENT:   no hearing loss, no loose or painful teeth, no dentures, last saw dentist every 6 months  Hematologic:  no easy bruising, no abnormal bleeding, no clotting disorder, no frequent epistaxis  Endocrine:  no diabetes, does not check CBG's at hom     Physical Exam:   BP 123/85   Pulse 75   Resp 20   Ht $R'5\' 8"'iw$  (1.727 m)   Wt 208 lb (94.3 kg)   SpO2 99% Comment: RA  BMI 31.63 kg/m   General:  well-appearing  HEENT:  Unremarkable, NCAT, PERLA, EOMI  Neck:   no JVD, no bruits,  no adenopathy   Chest:   clear to auscultation, symmetrical breath sounds, no wheezes, no rhonchi   CV:   RRR, 3/6 systolic murmur RSB, no diastolic  murmur  Abdomen:  soft, non-tender, no masses   Extremities:  warm, well-perfused, pulses palpable at ankle, no lower extremity edema  Rectal/GU  Deferred  Neuro:   Grossly non-focal and symmetrical throughout  Skin:   Clean and dry, no rashes, no breakdown  Diagnostic Tests:  ECHOCARDIOGRAM REPORT         Patient Name:   Daniel Solomon Date of Exam: 09/25/2020  Medical Rec #:  [id number]      Height:       68.0 in  Accession #:    [id number]     Weight:       220.0 lb  Date of Birth:  1960-08-20     BSA:          2.128 m  Patient Age:    61 years       BP:           130/82 mmHg  Patient Gender: M              HR:           62 bpm.  Exam Location:  [place]   Procedure: 2D Echo, Color Doppler and Cardiac Doppler   Indications:    I35.9 AVD     History:        Patient has prior history of Echocardiogram examinations,  most                  recent 07/28/2019. CAD, Aortic Valve Disease and Bicusid  aortic                  valve, Arrythmias:Atrial Fibrillation; Risk  Factors:Hypertension                  and Dyslipidemia.     Sonographer:    Wilford Sports Rodgers-Jones RDCS  Referring Phys: [place]     1. Left ventricular ejection fraction, by estimation, is 60 to 65%. The  left ventricle has normal function. The left ventricle has no regional  wall motion abnormalities. There is mild left ventricular hypertrophy.  Left ventricular diastolic parameters  are indeterminate.   2. Right ventricular systolic function is normal. The right ventricular  size is normal.   3. Left atrial size was moderately dilated.   4. The mitral valve is abnormal. Trivial mitral valve regurgitation. No  evidence of mitral stenosis.   5. Since study 07/01/19 mean gradient increased from 15->22.2 mmHg peak  25.8 -> 36.8 mmhg Better viewed on this study likely bicuspid with  raphe/fused non and right cusps . The aortic valve is normal in structure.  There is  moderate calcification of the  aortic valve. There is moderate thickening of the aortic valve. Aortic  valve regurgitation is not visualized. Moderate aortic valve stenosis.   6. Aortic dilatation noted. There is mild dilatation of the ascending  aorta, measuring 42 mm.   7. The inferior vena cava is normal in size with greater than 50%  respiratory variability, suggesting right atrial pressure of 3 mmHg.   FINDINGS   Left Ventricle: Left ventricular ejection fraction, by estimation, is 60  to 65%. The left ventricle has normal function. The left ventricle has no  regional wall motion abnormalities. The left ventricular internal cavity  size was normal  in size. There is   mild left ventricular hypertrophy. Left ventricular diastolic parameters  are indeterminate.   Right Ventricle: The right ventricular size is normal. No increase in  right ventricular wall thickness. Right ventricular systolic function is  normal.   Left Atrium: Left atrial size was moderately dilated.   Right Atrium: Right atrial size was normal in size.   Pericardium: There is no evidence of pericardial effusion.   Mitral Valve: The mitral valve is abnormal. There is mild thickening of  the mitral valve leaflet(s). There is mild calcification of the mitral  valve leaflet(s). Mild mitral annular calcification. Trivial mitral valve  regurgitation. No evidence of mitral  valve stenosis.   Tricuspid Valve: The tricuspid valve is normal in structure. Tricuspid  valve regurgitation is trivial. No evidence of tricuspid stenosis.   Aortic Valve: Since study 07/01/19 mean gradient increased from 15->22.2  mmHg peak 25.8 -> 36.8 mmhg Better viewed on this study likely bicuspid  with raphe/fused non and right cusps. The aortic valve is normal in  structure. There is moderate calcification  of the aortic valve. There is moderate thickening of the aortic valve.  Aortic valve regurgitation is not visualized. Moderate  aortic stenosis is  present. Aortic valve mean gradient measures 22.2 mmHg. Aortic valve peak  gradient measures 36.8 mmHg. Aortic   valve area, by VTI measures 1.27 cm.   Pulmonic Valve: The pulmonic valve was normal in structure. Pulmonic valve  regurgitation is not visualized. No evidence of pulmonic stenosis.   Aorta: Aortic dilatation noted. There is mild dilatation of the ascending  aorta, measuring 42 mm.   Venous: The inferior vena cava is normal in size with greater than 50%  respiratory variability, suggesting right atrial pressure of 3 mmHg.   IAS/Shunts: No atrial level shunt detected by color flow Doppler.      LEFT VENTRICLE  PLAX 2D  LVIDd:         3.90 cm  Diastology  LVIDs:         2.60 cm  LV e' medial:    9.86 cm/s  LV PW:         1.20 cm  LV E/e' medial:  10.1  LV IVS:        1.00 cm  LV e' lateral:   13.47 cm/s  LVOT diam:     2.30 cm  LV E/e' lateral: 7.4  LV SV:         78  LV SV Index:   37  LVOT Area:     4.15 cm      RIGHT VENTRICLE  RV Basal diam:  4.60 cm  RV S prime:     13.00 cm/s  TAPSE (M-mode): 2.4 cm   LEFT ATRIUM             Index       RIGHT ATRIUM           Index  LA diam:        4.70 cm 2.21 cm/m  RA Area:     14.90 cm  LA Vol (A2C):   52.9 ml 24.86 ml/m RA Volume:   42.90 ml  20.16 ml/m  LA Vol (A4C):   89.3 ml 41.96 ml/m  LA Biplane Vol: 75.3 ml 35.38 ml/m   AORTIC VALVE  AV Area (Vmax):    1.30 cm  AV Area (Vmean):   1.20 cm  AV Area (VTI):     1.27 cm  AV Vmax:  303.40 cm/s  AV Vmean:          223.000 cm/s  AV VTI:            0.615 m  AV Peak Grad:      36.8 mmHg  AV Mean Grad:      22.2 mmHg  LVOT Vmax:         95.05 cm/s  LVOT Vmean:        64.600 cm/s  LVOT VTI:          0.188 m  LVOT/AV VTI ratio: 0.30     AORTA  Ao Root diam: 3.10 cm  Ao Asc diam:  4.20 cm   MV E velocity: 99.07 cm/s                             SHUNTS                             Systemic VTI:  0.19 m                              Systemic Diam: 2.30 cm   Jenkins Rouge MD  Electronically signed by Jenkins Rouge MD  Signature Date/Time: 09/25/2020/3:43:06 PM         Final      Physicians  Panel Physicians Referring Physician Case Authorizing Physician  Burnell Blanks, MD (Primary)     Procedures  RIGHT HEART CATH AND CORONARY ANGIOGRAPHY   Conclusion      Prox LAD to Mid LAD lesion is 50% stenosed.   Mid LAD lesion is 50% stenosed.   Dist LAD-1 lesion is 20% stenosed.   Dist LAD-2 lesion is 50% stenosed.   1st Mrg lesion is 30% stenosed.   Prox Cx to Mid Cx lesion is 40% stenosed.   Dist RCA lesion is 70% stenosed.   Moderate diffuse LAD disease. Patent mid to distal LAD stent Mild mid Circumflex disease Large dominant RCA with moderately severe distal stenosis at the bifurcation into the PDA and posterolateral artery.    Recommendations: Will continue workup for AVR vs TAVR. Will arrange CT scans next. His distal RCA stenosis could be addressed with PCI but given his lack of angina or ischemic symptoms this would be best managed medically. Given his young age, will still need to consider surgical AVR as well. Further planning will be based on his CT scans.    Procedural Details  Technical Details Indication: Moderately severe bicuspid aortic stenosis, known CAD without angina. Planning for renal transplant.   Procedure: The risks, benefits, complications, treatment options, and expected outcomes were discussed with the patient. The patient and/or family concurred with the proposed plan, giving informed consent. The patient was brought to the cath lab after IV hydration was given. The patient was further sedated with Versed and Fentanyl. The antecubital IV catheter was prepped and draped. This was changed for a 5 [place] sheath. Right heart catheterization performed with a balloon tipped catheter. The right groin was prepped and draped in the usual manner. Using the modified Seldinger access  technique, a 5 French sheath was placed in the right femoral artery using u/s guidance with a micropuncture kit. Standard diagnostic catheters were used to perform selective coronary angiography. I did not cross the aortic valve. Mynx closure device placed in the right femoral artery  There were no immediate complications. The patient was taken to the recovery area in stable condition.     Estimated blood loss <50 mL.   During this procedure medications were administered to achieve and maintain moderate conscious sedation while the patient's heart rate, blood pressure, and oxygen saturation were continuously monitored and I was present face-to-face 100% of this time.   Medications (Filter: Administrations occurring from [phone] to 1002 on 12/04/20)  important  Continuous medications are totaled by the amount administered until 12/04/20 1002.   lidocaine (PF) (XYLOCAINE) 1 % injection (mL) Total volume:  12 mL Date/Time Rate/Dose/Volume Action   12/04/20 0914 2 mL Given   0916 10 mL Given    fentaNYL (SUBLIMAZE) injection (mcg) Total dose:  50 mcg Date/Time Rate/Dose/Volume Action   12/04/20 0914 25 mcg Given   0924 25 mcg Given    midazolam (VERSED) injection (mg) Total dose:  2 mg Date/Time Rate/Dose/Volume Action   12/04/20 0915 1 mg Given   0924 1 mg Given    Heparin (Porcine) in NaCl 1000-0.9 UT/500ML-% SOLN (mL) Total volume:  1,000 mL Date/Time Rate/Dose/Volume Action   12/04/20 0915 500 mL Given   0915 500 mL Given    iohexol (OMNIPAQUE) 350 MG/ML injection (mL) Total volume:  70 mL Date/Time Rate/Dose/Volume Action   12/04/20 0949 70 mL Given    Sedation Time  Sedation Time Physician-1: 31 minutes 45 seconds Contrast  Medication Name Total Dose  iohexol (OMNIPAQUE) 350 MG/ML injection 70 mL   Radiation/Fluoro  Fluoro time: 12 (min) DAP: 95320 (mGycm2) Cumulative Air Kerma: 448 (mGy) Coronary Findings  Diagnostic Dominance: Right Left Anterior  Descending  Vessel is large.  Prox LAD to Mid LAD lesion is 50% stenosed.  Mid LAD lesion is 50% stenosed.  Dist LAD-1 lesion is 20% stenosed. The lesion was previously treated using a drug eluting stent over 2 years ago.  Dist LAD-2 lesion is 50% stenosed.    Left Circumflex  Prox Cx to Mid Cx lesion is 40% stenosed.    First Obtuse Marginal Branch  1st Mrg lesion is 30% stenosed.    Right Coronary Artery  Vessel is large.  Dist RCA lesion is 70% stenosed.    Intervention   No interventions have been documented.   Coronary Diagrams  Diagnostic Dominance: Right Intervention  Implants     Vascular Products  Closure Mynx Control 48f - [id number] - Implanted Inventory item: CLOSURE [facility] CONTROL 49F Model/Cat number: [id number]  Manufacturer: [place] Lot number: [id number]  Device identifier: [id number] Device identifier type: GS1  GUDID Information  Request status Successful    Brand name: MYNX CONTROL Version/Model: LP5300  Company name: [place] safety info as of 12/04/20: MR Safe  Contains dry or latex rubber: No    GMDN P.T. name: Wound hydrogel dressing, non-antimicrobial     As of 12/04/2020  Status: Implanted       Syngo Images   Show images for CARDIAC CATHETERIZATION Images on Long Term Storage   Show images for Cristofher, Livecchi "Claiborne Billings" Link to Procedure Log  Procedure Log    Hemo Data  Flowsheet Row Most Recent Value  Fick Cardiac Output 8.36 L/min  Fick Cardiac Output Index 3.96 (L/min)/BSA  RA A Wave 4 mmHg  RA V Wave 3 mmHg  RA Mean 1 mmHg  RV Systolic Pressure 22 mmHg  RV Diastolic Pressure 1 mmHg  RV EDP 2 mmHg  PA Systolic Pressure 19 mmHg  PA Diastolic Pressure 9 mmHg  PA Mean 15 mmHg  PW A Wave 9 mmHg  PW V Wave 12 mmHg  PW Mean 8 mmHg  AO Systolic Pressure 833 mmHg  AO Diastolic Pressure 72 mmHg  AO Mean 91 mmHg  QP/QS 1  TPVR Index 3.79 HRUI  TSVR Index 22.99 HRUI  PVR SVR Ratio 0.08  TPVR/TSVR  Ratio 0.16    CLINICAL DATA:  Severe Aortic Stenosis.   EXAM: Cardiac TAVR CT   TECHNIQUE: A non-contrast, gated CT scan was obtained with axial slices of 3 mm through the heart for aortic valve calcium scoring. A 120 kV retrospective, gated, contrast cardiac scan was obtained. Gantry rotation speed was 250 msecs and collimation was 0.6 mm. Nitroglycerin was not given. The 3D data set was reconstructed in 5% intervals of the 0-95% of the R-R cycle. Systolic and diastolic phases were analyzed on a dedicated workstation using MPR, MIP, and VRT modes. The patient received 100 cc of contrast.   FINDINGS: Image quality: Excellent.   Noise artifact is: Limited.   Valve Morphology: Bicuspid aortic valve with fusion of the RCC/NCC with raphe (Sievers type 1). There is bulky calcification of the raphe. Leaflets are severely calcified with restricted leaflet movement in systole.   Aortic Valve Calcium score: 2103   Aortic annular dimension:   Phase assessed: 25%   Annular area: 513 mm2   Annular perimeter: 82.0 mm   Max diameter: 27.8 mm   Min diameter: 24.2 mm   Annular and subannular calcification: None.   Optimal coplanar projection: LAO 4 CRA 8   Coronary Artery Height above Annulus:   Left Main: 14.3 mm   Right Coronary: 11.2 mm   Sinus of Valsalva Measurements:   Maximum diameter: 36 mm   Inter-commissural diameter: 30 mm   Sinus of Valsalva Height:   Non-coronary: 19.0 mm   Right-coronary: 19.0 mm   Left-coronary: 26.7 mm   Sinotubular Junction: 33 mm   Ascending Thoracic Aorta: Aneurysmal up to 42 mm (double oblique).   Coronary Arteries: Normal coronary origin. Right dominance. The study was performed without use of NTG and is insufficient for plaque evaluation. Please refer to recent cardiac catheterization for coronary assessment. 3-vessel coronary calcifications noted.   Cardiac Morphology:   Right Atrium: Right atrial size is within  normal limits.   Right Ventricle: The right ventricular cavity is within normal limits.   Left Atrium: Left atrial size is normal in size with no left atrial appendage filling defect.   Left Ventricle: The ventricular cavity size is within normal limits. There are no stigmata of prior infarction. There is no abnormal filling defect.   Pulmonary arteries: Normal in size without proximal filling defect.   Pulmonary veins: Normal pulmonary venous drainage.   Pericardium: Normal thickness with no significant effusion or calcium present.   Mitral Valve: The mitral valve is normal structure without significant calcification.   Extra-cardiac findings: See attached radiology report for non-cardiac structures.   IMPRESSION: 1. Bicuspid aortic valve with fusion of the RCC/NCC with raphe (Sievers type 1). Bulky calcification of the raphe noted.   2. Annular measurements support a 26 mm S3 TAVR (513 mm2).   3. No significant annular or subannular calcifications.   4. Sufficient coronary to annulus distance.   5. Optimal Fluoroscopic Angle for Delivery: LAO 4 CRA 8   6. 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   [place] T. Audie Box, MD  Electronically Signed   By: Eleonore Chiquito M.D.   On: 12/23/2020 09:06    Addended by Geralynn Rile, MD on 12/23/2020  9:09 AM   Study Result  Narrative & Impression  EXAM: OVER-READ INTERPRETATION  CT CHEST   The following report is an over-read performed by radiologist Dr. Vinnie Langton of [facility], [place] on 12/23/2020. This over-read does not include interpretation of cardiac or coronary anatomy or pathology. The coronary calcium score/coronary CTA interpretation by the cardiologist is attached.   COMPARISON:  None.   FINDINGS: Extracardiac findings will be described separately under dictation for contemporaneously obtained CTA chest, abdomen and pelvis.   IMPRESSION: Please  see separate dictation for contemporaneously obtained CTA chest, abdomen and pelvis dated 12/22/2020 for full description of relevant extracardiac findings.   Electronically Signed: By: Vinnie Langton M.D. On: 12/23/2020 05:14     Study Result  Narrative & Impression  CLINICAL DATA:  60 year old male with history of severe aortic stenosis. Preprocedural study prior to potential transcatheter aortic valve replacement (TAVR) procedure.   EXAM: CT ANGIOGRAPHY CHEST, ABDOMEN AND PELVIS   TECHNIQUE: Multidetector CT imaging through the chest, abdomen and pelvis was performed using the standard protocol during bolus administration of intravenous contrast. Multiplanar reconstructed images and MIPs were obtained and reviewed to evaluate the vascular anatomy.   CONTRAST:  142mL OMNIPAQUE IOHEXOL 350 MG/ML SOLN   COMPARISON:  None.   FINDINGS: CTA CHEST FINDINGS   Cardiovascular: Heart size is borderline enlarged. There is no significant pericardial fluid, thickening or pericardial calcification. There is aortic atherosclerosis, as well as atherosclerosis of the great vessels of the mediastinum and the coronary arteries, including calcified atherosclerotic plaque in the left main, left anterior descending, left circumflex and right coronary arteries. Severe thickening and calcification of the aortic valve.   Mediastinum/Lymph Nodes: No pathologically enlarged mediastinal or hilar lymph nodes. Esophagus is unremarkable in appearance. No axillary lymphadenopathy.   Lungs/Pleura: No acute consolidative airspace disease. No pleural effusions. A few scattered small pulmonary nodules are noted in the lungs bilaterally, largest of which is in the medial segment of the right middle lobe measuring 4 mm (axial image 86 of series 5).   Musculoskeletal/Soft Tissues: There are no aggressive appearing lytic or blastic lesions noted in the visualized portions of the skeleton.   CTA  ABDOMEN AND PELVIS FINDINGS   Hepatobiliary: No suspicious cystic or solid hepatic lesions. No intra or extrahepatic biliary ductal dilatation. Gallbladder is normal in appearance.   Pancreas: No pancreatic mass. No pancreatic ductal dilatation. No pancreatic or peripancreatic fluid collections or inflammatory changes.   Spleen: Unremarkable.   Adrenals/Urinary Tract: Severe atrophy of the left kidney. Moderate atrophy of the right kidney. Multiple low-attenuation lesions in both kidneys, compatible with simple cysts. In addition, in the lower pole of the left kidney (axial image 146 of series 4) there is an exophytic 3.0 cm intermediate attenuation (43 HU) lesion which is increased in size compared to prior abdominal MRI 07/09/2019. No hydroureteronephrosis. Urinary bladder is normal in appearance. Bilateral adrenal glands are normal in appearance.   Stomach/Bowel: The appearance of the stomach is normal. No pathologic dilatation of small bowel or colon. Normal appendix.   Vascular/Lymphatic: Aortic atherosclerosis, with vascular findings and measurements pertinent to potential TAVR procedure, as detailed below. No aneurysm or dissection noted in the abdominal or pelvic vasculature. No lymphadenopathy noted in the abdomen or pelvis.   Reproductive: Prostate gland and seminal vesicles are unremarkable in appearance.  Other: Tenckhoff peritoneal dialysis catheter with tip coiled in the low anatomic pelvis. Small volume of fluid in the peritoneal cavity, presumably peritoneal dialysate. No pneumoperitoneum.   Musculoskeletal: There are no aggressive appearing lytic or blastic lesions noted in the visualized portions of the skeleton.   VASCULAR MEASUREMENTS PERTINENT TO TAVR:   AORTA:   Minimal Aortic Diameter-15 x 17 mm   Severity of Aortic Calcification-moderate   RIGHT PELVIS:   Right Common Iliac Artery -   Minimal Diameter-9.0 x 9.9 mm   Tortuosity-mild    Calcification-moderate   Right External Iliac Artery -   Minimal Diameter-9.4 x 9.9 mm   Tortuosity-moderate   Calcification-minimal   Right Common Femoral Artery -   Minimal Diameter-8.9 x 9.1 mm   Tortuosity-mild   Calcification-mild   LEFT PELVIS:   Left Common Iliac Artery -   Minimal Diameter-10.8 x 11.1 mm   Tortuosity-moderate   Calcification-mild   Left External Iliac Artery -   Minimal Diameter-9.9 x 9.6 mm   Tortuosity-mild   Calcification-none   Left Common Femoral Artery -   Minimal Diameter-9.9 x 8.4 mm   Tortuosity-mild   Calcification-mild   Review of the MIP images confirms the above findings.   IMPRESSION: 1. Vascular findings and measurements pertinent to potential TAVR procedure, as detailed above. 2. Severe thickening and calcification of the aortic valve, compatible with reported clinical history of severe aortic stenosis. 3. Enlarging exophytic intermediate attenuation lesion in the lower pole of the left kidney. Although does may simply represent a proteinaceous/hemorrhagic cysts, further characterization with nonemergent MRI of the abdomen with and without IV gadolinium is recommended in the near future to better characterize this finding and exclude the possibility of a small renal neoplasm. 4. Aortic atherosclerosis, in addition to left main and 3 vessel coronary artery disease. Please note that although the presence of coronary artery calcium documents the presence of coronary artery disease, the severity of this disease and any potential stenosis cannot be assessed on this non-gated CT examination. Assessment for potential risk factor modification, dietary therapy or pharmacologic therapy may be warranted, if clinically indicated. 5. Small pulmonary nodules measuring 4 mm or less in size, nonspecific, but statistically likely benign. No follow-up needed if patient is low-risk (and has no known or suspected  primary neoplasm). Non-contrast chest CT can be considered in 12 months if patient is high-risk. This recommendation follows the consensus statement: Guidelines for Management of Incidental Pulmonary Nodules Detected on CT Images: From the Fleischner Society 2017; Radiology 2017; 284:228-243. 6. Moderate right and severe left renal atrophy. 7. Additional incidental findings, as above.     Electronically Signed   By: Vinnie Langton M.D.   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  I agree that aortic valve replacement is indicated prior to kidney transplantation to decrease his risk of perioperative complications and progressive left ventricular deterioration.  I have personally reviewed his 2D echocardiogram, cardiac catheterization, and CTA studies.  His echocardiogram shows a calcified and thickened bicuspid aortic valve with a mean gradient  of 22 mmHg and a peak gradient of 37 mmHg with significant worsening over the past year.  There is restricted leaflet mobility and I think his valve looks like a severely stenotic valve.  The mean gradient across aortic valve was measured at 32 mmHg by echocardiogram at [facility] in June 2022.  Cardiac catheterization shows moderate diffuse LAD disease with a patent mid to distal LAD stent.  There is mild left circumflex disease.  The RCA is a large dominant vessel with moderately severe distal stenosis at the bifurcation of the PDA  and PL.  I think his coronary disease could be managed medically at this time.  His risk for open surgical aortic valve replacement and CABG would be increased due to his comorbid risk factors and I think TAVR would be a reasonable alternative for treating his aortic stenosis prior to kidney transplantation.  His gated cardiac CTA shows anatomy suitable for TAVR using a SAPIEN 3 valve.  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  His abdominal and pelvic CTA shows adequate pelvic vascular anatomy to allow transfemoral insertion.  The patient was counseled at length regarding treatment alternatives for management of severe symptomatic aortic stenosis. The risks and benefits of surgical intervention has been discussed in detail. Long-term prognosis with medical therapy was discussed. Alternative approaches such as conventional surgical aortic valve replacement, transcatheter aortic valve replacement, and palliative medical therapy were compared and contrasted at length. This discussion was placed in the context of the patient's own specific clinical presentation and past medical history. All of his questions have been addressed.   Following the decision to proceed with transcatheter aortic valve replacement, a discussion was held regarding what types of management strategies would be attempted intraoperatively in the event of life-threatening complications, including whether or not the patient would be considered a candidate for the use of cardiopulmonary bypass and/or conversion to open sternotomy for attempted surgical intervention.  I think he would be a candidate for emergent sternotomy to manage any intraoperative complications.  The patient is aware of the fact that transient use of cardiopulmonary bypass may be necessary. The patient has been advised of a variety of complications that might develop including but not limited to risks of death, stroke, paravalvular leak, aortic dissection or other  major vascular complications, aortic annulus rupture, device embolization, cardiac rupture or perforation, mitral regurgitation, acute myocardial infarction, arrhythmia, heart block or bradycardia requiring permanent pacemaker placement, congestive heart failure, respiratory failure, renal failure, pneumonia, infection, other late complications related to structural valve deterioration or migration, or other complications that might ultimately cause a temporary or permanent loss of functional independence or other long term morbidity. The patient provides full informed consent for the procedure as described and all questions were answered.     Plan:  Transfemoral transcatheter aortic valve replacement using a SAPIEN 3 valve on 01/19/2021.  I spent 60 minutes performing this consultation and > 50% of this time was spent face to face counseling and coordinating the care of this patient's moderate to severe symptomatic aortic stenosis.   Gaye Pollack, MD 01/13/2021

## 2021-01-14 NOTE — Progress Notes (Addendum)
Surgical Instructions    Your procedure is scheduled on Tuesday, November 22nd, 2022.   Report to [facility] Main Entrance "A" at 08:45 A.M., then check in with the Admitting office.  Call this number if you have problems the morning of surgery:  [phone]   If you have any questions prior to your surgery date call [phone]: Open Monday-Friday 8am-4pm    Remember:  Do not eat or drink after midnight the night before your surgery   STOP now taking any Aspirin (unless otherwise instructed by your surgeon), Aleve, Naproxen, Ibuprofen, Motrin, Advil, Goody's, BC's, all herbal medications, fish oil, and all vitamins.    Stop taking Eliquis on 11/17 (Thursday). You will take your last dose on 11/16 (Wednesday). Continue taking all other medications without change through the day before surgery. On the morning of surgery do not take any medications.   After your COVID test   You are not required to quarantine however you are required to wear a well-fitting mask when you are out and around people not in your household.  If your mask becomes wet or soiled, replace with a new one.  Wash your hands often with soap and water for 20 seconds or clean your hands with an alcohol-based hand sanitizer that contains at least 60% alcohol.  Do not share personal items.  Notify your provider: if you are in close contact with someone who has COVID  or if you develop a fever of 100.4 or greater, sneezing, cough, sore throat, shortness of breath or body aches.    The day of surgery:          Do not wear jewelry  Do not wear lotions, powders, colognes, or deodorant. Men may shave face and neck. Do not bring valuables to the hospital.              [facility] is not responsible for any belongings or valuables.  Do NOT Smoke (Tobacco/Vaping)  24 hours prior to your procedure  If you use a CPAP at night, you may bring your mask for your overnight stay.   Contacts, glasses, hearing aids,  dentures or partials may not be worn into surgery, please bring cases for these belongings   For patients admitted to the hospital, discharge time will be determined by your treatment team.   Patients discharged the day of surgery will not be allowed to drive home, and someone needs to stay with them for 24 hours.  NO VISITORS WILL BE ALLOWED IN PRE-OP WHERE PATIENTS ARE PREPPED FOR SURGERY.  ONLY 1 SUPPORT PERSON MAY BE PRESENT IN THE WAITING ROOM WHILE YOU ARE IN SURGERY.  IF YOU ARE TO BE ADMITTED, ONCE YOU ARE IN YOUR ROOM YOU WILL BE ALLOWED TWO (2) VISITORS. 1 (ONE) VISITOR MAY STAY OVERNIGHT BUT MUST ARRIVE TO THE ROOM BY 8pm.  Minor children may have two parents present. Special consideration for safety and communication needs will be reviewed on a case by case basis.  Special instructions:    Oral Hygiene is also important to reduce your risk of infection.  Remember - BRUSH YOUR TEETH THE MORNING OF SURGERY WITH YOUR REGULAR TOOTHPASTE   [facility]- Preparing For Surgery  Before surgery, you can play an important role. Because skin is not sterile, your skin needs to be as free of germs as possible. You can reduce the number of germs on your skin by washing with CHG (chlorahexidine gluconate) Soap before surgery.  CHG is an antiseptic cleaner  which kills germs and bonds with the skin to continue killing germs even after washing.     Please do not use if you have an allergy to CHG or antibacterial soaps. If your skin becomes reddened/irritated stop using the CHG.  Do not shave (including legs and underarms) for at least 48 hours prior to first CHG shower. It is OK to shave your face.  Please follow these instructions carefully.     Shower the NIGHT BEFORE SURGERY and the MORNING OF SURGERY with CHG Soap.   If you chose to wash your hair, wash your hair first as usual with your normal shampoo. After you shampoo, rinse your hair and body thoroughly to remove the shampoo.  Then [employer] and genitals (private parts) with your normal soap and rinse thoroughly to remove soap.  After that Use CHG Soap as you would any other liquid soap. You can apply CHG directly to the skin and wash gently with a scrungie or a clean washcloth.   Apply the CHG Soap to your body ONLY FROM THE NECK DOWN.  Do not use on open wounds or open sores. Avoid contact with your eyes, ears, mouth and genitals (private parts). Wash Face and genitals (private parts)  with your normal soap.   Wash thoroughly, paying special attention to the area where your surgery will be performed.  Thoroughly rinse your body with warm water from the neck down.  DO NOT shower/wash with your normal soap after using and rinsing off the CHG Soap.  Pat yourself dry with a CLEAN TOWEL.  Wear CLEAN PAJAMAS to bed the night before surgery  Place CLEAN SHEETS on your bed the night before your surgery  DO NOT SLEEP WITH PETS.   Day of Surgery:  Take a shower with CHG soap. Wear Clean/Comfortable clothing the morning of surgery Do not apply any deodorants/lotions.   Remember to brush your teeth WITH YOUR REGULAR TOOTHPASTE.   Please read over the following fact sheets that you were given.

## 2021-01-15 ENCOUNTER — Ambulatory Visit (HOSPITAL_COMMUNITY)
Admission: RE | Admit: 2021-01-15 | Discharge: 2021-01-15 | Disposition: A | Discharge status: Home / Self Care | Payer: Managed Care, Other (non HMO) | Source: Ambulatory Visit | Attending: Cardiovascular Disease | Admitting: Cardiovascular Disease

## 2021-01-15 ENCOUNTER — Encounter (HOSPITAL_COMMUNITY): Payer: Self-pay

## 2021-01-15 ENCOUNTER — Encounter (HOSPITAL_COMMUNITY)
Admission: RE | Admit: 2021-01-15 | Discharge: 2021-01-15 | Disposition: A | Discharge status: Home / Self Care | Payer: Managed Care, Other (non HMO) | Source: Ambulatory Visit | Attending: Cardiovascular Disease | Admitting: Cardiovascular Disease

## 2021-01-15 ENCOUNTER — Other Ambulatory Visit: Payer: Self-pay

## 2021-01-15 VITALS — BP 113/70 | HR 73 | Temp 97.7°F | Resp 19 | Ht 68.0 in | Wt 219.4 lb

## 2021-01-15 DIAGNOSIS — M109 Gout, unspecified: Secondary | ICD-10-CM | POA: Insufficient documentation

## 2021-01-15 DIAGNOSIS — I350 Nonrheumatic aortic (valve) stenosis: Secondary | ICD-10-CM

## 2021-01-15 DIAGNOSIS — Z955 Presence of coronary angioplasty implant and graft: Secondary | ICD-10-CM | POA: Diagnosis not present

## 2021-01-15 DIAGNOSIS — E7800 Pure hypercholesterolemia, unspecified: Secondary | ICD-10-CM | POA: Diagnosis not present

## 2021-01-15 DIAGNOSIS — Q231 Congenital insufficiency of aortic valve: Secondary | ICD-10-CM | POA: Insufficient documentation

## 2021-01-15 DIAGNOSIS — Z992 Dependence on renal dialysis: Secondary | ICD-10-CM | POA: Insufficient documentation

## 2021-01-15 DIAGNOSIS — G4733 Obstructive sleep apnea (adult) (pediatric): Secondary | ICD-10-CM | POA: Diagnosis not present

## 2021-01-15 DIAGNOSIS — Z20822 Contact with and (suspected) exposure to covid-19: Secondary | ICD-10-CM | POA: Diagnosis not present

## 2021-01-15 DIAGNOSIS — Z6833 Body mass index (BMI) 33.0-33.9, adult: Secondary | ICD-10-CM | POA: Diagnosis not present

## 2021-01-15 DIAGNOSIS — I120 Hypertensive chronic kidney disease with stage 5 chronic kidney disease or end stage renal disease: Secondary | ICD-10-CM | POA: Insufficient documentation

## 2021-01-15 DIAGNOSIS — E669 Obesity, unspecified: Secondary | ICD-10-CM | POA: Diagnosis not present

## 2021-01-15 DIAGNOSIS — D631 Anemia in chronic kidney disease: Secondary | ICD-10-CM | POA: Insufficient documentation

## 2021-01-15 DIAGNOSIS — H409 Unspecified glaucoma: Secondary | ICD-10-CM | POA: Diagnosis not present

## 2021-01-15 DIAGNOSIS — N186 End stage renal disease: Secondary | ICD-10-CM | POA: Insufficient documentation

## 2021-01-15 DIAGNOSIS — Z01818 Encounter for other preprocedural examination: Secondary | ICD-10-CM | POA: Diagnosis present

## 2021-01-15 DIAGNOSIS — I6523 Occlusion and stenosis of bilateral carotid arteries: Secondary | ICD-10-CM | POA: Diagnosis not present

## 2021-01-15 DIAGNOSIS — I2510 Atherosclerotic heart disease of native coronary artery without angina pectoris: Secondary | ICD-10-CM | POA: Insufficient documentation

## 2021-01-15 DIAGNOSIS — Z9989 Dependence on other enabling machines and devices: Secondary | ICD-10-CM | POA: Diagnosis not present

## 2021-01-15 LAB — CBC
HCT: 30.2 % — ABNORMAL LOW (ref 39.0–52.0)
Hemoglobin: 9.5 g/dL — ABNORMAL LOW (ref 13.0–17.0)
MCH: 33.5 pg (ref 26.0–34.0)
MCHC: 31.5 g/dL (ref 30.0–36.0)
MCV: 106.3 fL — ABNORMAL HIGH (ref 80.0–100.0)
Platelets: 116 10*3/uL — ABNORMAL LOW (ref 150–400)
RBC: 2.84 MIL/uL — ABNORMAL LOW (ref 4.22–5.81)
RDW: 13.3 % (ref 11.5–15.5)
WBC: 5.4 10*3/uL (ref 4.0–10.5)
nRBC: 0 % (ref 0.0–0.2)

## 2021-01-15 LAB — COMPREHENSIVE METABOLIC PANEL
ALT: 21 U/L (ref 0–44)
AST: 12 U/L — ABNORMAL LOW (ref 15–41)
Albumin: 3.2 g/dL — ABNORMAL LOW (ref 3.5–5.0)
Alkaline Phosphatase: 57 U/L (ref 38–126)
Anion gap: 15 (ref 5–15)
BUN: 67 mg/dL — ABNORMAL HIGH (ref 6–20)
CO2: 22 mmol/L (ref 22–32)
Calcium: 8.9 mg/dL (ref 8.9–10.3)
Chloride: 103 mmol/L (ref 98–111)
Creatinine, Ser: 13.6 mg/dL — ABNORMAL HIGH (ref 0.61–1.24)
GFR, Estimated: 4 mL/min — ABNORMAL LOW (ref >60)
Glucose, Bld: 94 mg/dL (ref 70–99)
Potassium: 3.9 mmol/L (ref 3.5–5.1)
Sodium: 140 mmol/L (ref 135–145)
Total Bilirubin: 0.7 mg/dL (ref 0.3–1.2)
Total Protein: 6.2 g/dL — ABNORMAL LOW (ref 6.5–8.1)

## 2021-01-15 LAB — BLOOD GAS, ARTERIAL
Acid-Base Excess: 0.3 mmol/L (ref 0.0–2.0)
Bicarbonate: 24.6 mmol/L (ref 20.0–28.0)
Drawn by: 602861
FIO2: 21
O2 Saturation: 96.6 %
Patient temperature: 37
pCO2 arterial: 40.8 mmHg (ref 32.0–48.0)
pH, Arterial: 7.397 (ref 7.350–7.450)
pO2, Arterial: 122 mmHg — ABNORMAL HIGH (ref 83.0–108.0)

## 2021-01-15 LAB — URINALYSIS, ROUTINE W REFLEX MICROSCOPIC
Bilirubin Urine: NEGATIVE
Glucose, UA: 50 mg/dL — AB
Ketones, ur: NEGATIVE mg/dL
Leukocytes,Ua: NEGATIVE
Nitrite: NEGATIVE
Protein, ur: 100 mg/dL — AB
RBC / HPF: >50 RBC/hpf — ABNORMAL HIGH (ref 0–5)
Specific Gravity, Urine: 1.011 (ref 1.005–1.030)
pH: 7 (ref 5.0–8.0)

## 2021-01-15 LAB — SARS CORONAVIRUS 2 (TAT 6-24 HRS): SARS Coronavirus 2: NEGATIVE

## 2021-01-15 LAB — PROTIME-INR
INR: 1.1 (ref 0.8–1.2)
Prothrombin Time: 14.6 seconds (ref 11.4–15.2)

## 2021-01-15 LAB — TYPE AND SCREEN
ABO/RH(D): O POS
Antibody Screen: NEGATIVE

## 2021-01-15 LAB — SURGICAL PCR SCREEN
MRSA, PCR: NEGATIVE
Staphylococcus aureus: NEGATIVE

## 2021-01-15 LAB — BRAIN NATRIURETIC PEPTIDE: B Natriuretic Peptide: 123.8 pg/mL — ABNORMAL HIGH (ref 0.0–100.0)

## 2021-01-15 NOTE — Progress Notes (Signed)
PCP - Dr. Garret Reddish Cardiologist - Dr. Lauree Chandler  PPM/ICD - denies   Chest x-ray - 01/15/21 at PAT EKG - 01/15/21 at PAT Stress Test - denies ECHO - 09/25/20 Cardiac Cath - 12/04/20  Sleep Study - 12/13/2010, diagnosed with OSA CPAP - yes  DM- denies  Blood Thinner Instructions: Pt instructed to take last dose of Eliquis on 01/13/21 Aspirin Instructions: n/a  ERAS Protcol - no, NPO   COVID TEST- 01/15/21 at PAT   Anesthesia review: yes, cardiac hx  Patient denies shortness of breath, fever, cough and chest pain at PAT appointment   All instructions explained to the patient, with a verbal understanding of the material. Patient agrees to go over the instructions while at home for a better understanding. Patient also instructed to wear a mask in public after being tested for COVID-19. The opportunity to ask questions was provided.

## 2021-01-18 MED ORDER — NOREPINEPHRINE 4 MG/250ML-% IV SOLN
0.0000 ug/min | INTRAVENOUS | Status: AC
Start: 2021-01-19 — End: 2021-01-19
  Administered 2021-01-19: 2 ug/min via INTRAVENOUS
  Filled 2021-01-18: qty 250

## 2021-01-18 MED ORDER — CEFAZOLIN SODIUM-DEXTROSE 2-4 GM/100ML-% IV SOLN
2.0000 g | INTRAVENOUS | Status: AC
  Administered 2021-01-19: 2 g via INTRAVENOUS
  Filled 2021-01-18: qty 100

## 2021-01-18 MED ORDER — POTASSIUM CHLORIDE 2 MEQ/ML IV SOLN
80.0000 meq | INTRAVENOUS | Status: DC
  Filled 2021-01-18: qty 40

## 2021-01-18 MED ORDER — MAGNESIUM SULFATE 50 % IJ SOLN
40.0000 meq | INTRAMUSCULAR | Status: DC
  Filled 2021-01-18: qty 9.85

## 2021-01-18 MED ORDER — DEXMEDETOMIDINE HCL IN NACL 400 MCG/100ML IV SOLN
0.1000 ug/kg/h | INTRAVENOUS | Status: AC
Start: 2021-01-19 — End: 2021-01-19
  Administered 2021-01-19: 4 ug via INTRAVENOUS
  Administered 2021-01-19: 6 ug/kg/h via INTRAVENOUS
  Filled 2021-01-18: qty 100

## 2021-01-18 MED ORDER — HEPARIN 30,000 UNITS/1000 ML (OHS) CELLSAVER SOLUTION
Status: DC
  Filled 2021-01-18: qty 1000

## 2021-01-18 NOTE — Anesthesia Preprocedure Evaluation (Addendum)
Anesthesia Evaluation  Patient identified by MRN, date of birth, ID band Patient awake    Reviewed: Allergy & Precautions, NPO status , Patient's Chart, lab work & pertinent test results  Airway Mallampati: III  TM Distance: <3 FB Neck ROM: Full    Dental  (+) Teeth Intact, Dental Advisory Given   Pulmonary sleep apnea ,    breath sounds clear to auscultation       Cardiovascular hypertension, + CAD and + Cardiac Stents  + dysrhythmias Atrial Fibrillation  Rhythm:Regular Rate:Normal + Systolic murmurs Echo:  1. Left ventricular ejection fraction, by estimation, is 60 to 65%. The  left ventricle has normal function. The left ventricle has no regional  wall motion abnormalities. There is mild left ventricular hypertrophy.  Left ventricular diastolic parameters  are indeterminate.  2. Right ventricular systolic function is normal. The right ventricular  size is normal.  3. Left atrial size was moderately dilated.  4. The mitral valve is abnormal. Trivial mitral valve regurgitation. No  evidence of mitral stenosis.  5. Since study 07/01/19 mean gradient increased from 15->22.2 mmHg peak  25.8 -> 36.8 mmhg Better viewed on this study likely bicuspid with  raphe/fused non and right cusps . The aortic valve is normal in structure.  There is moderate calcification of the  aortic valve. There is moderate thickening of the aortic valve. Aortic  valve regurgitation is not visualized. Moderate aortic valve stenosis.  6. Aortic dilatation noted. There is mild dilatation of the ascending  aorta, measuring 42 mm.  7. The inferior vena cava is normal in size with greater than 50%  respiratory variability, suggesting right atrial pressure of 3 mmHg.    Neuro/Psych negative neurological ROS  negative psych ROS   GI/Hepatic negative GI ROS,   Endo/Other  negative endocrine ROS  Renal/GU ESRF and DialysisRenal disease      Musculoskeletal negative musculoskeletal ROS (+)   Abdominal Normal abdominal exam  (+)   Peds  Hematology negative hematology ROS (+)   Anesthesia Other Findings   Reproductive/Obstetrics                           Anesthesia Physical Anesthesia Plan  ASA: 4  Anesthesia Plan: MAC   Post-op Pain Management:    Induction: Intravenous  PONV Risk Score and Plan: 0 and Ondansetron  Airway Management Planned: Natural Airway and Simple Face Mask  Additional Equipment: Arterial line  Intra-op Plan:   Post-operative Plan:   Informed Consent: I have reviewed the patients History and Physical, chart, labs and discussed the procedure including the risks, benefits and alternatives for the proposed anesthesia with the patient or authorized representative who has indicated his/her understanding and acceptance.       Plan Discussed with: CRNA  Anesthesia Plan Comments: (PAT note written 01/18/2021 by Myra Gianotti, PA-C. )      Anesthesia Quick Evaluation

## 2021-01-18 NOTE — H&P (Signed)
[address]       [place] [address]             [phone]      Cardiothoracic Surgery Admission History and Physical  PCP is Yong Channel, Brayton Mars, MD Referring Provider is Lauree Chandler, MD Primary Cardiologist is Lauree Chandler, MD   Reason for admission:  Severe aortic stenosis   HPI:   The patient is a 60 year old gentleman with a history of hypertension, hyperlipidemia, end-stage renal disease on peritoneal dialysis, coronary artery disease status post stenting of the mid to distal LAD in 2012, bicuspid aortic valve disease with moderate aortic stenosis, thoracic aortic aneurysm, persistent atrial fibrillation, and sleep apnea.  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  Carotid Dopplers showed mild disease.  An event monitor showed atrial fibrillation and he was treated with Xarelto since January 2018.  He had a follow-up echocardiogram in May 2021 showing a bicuspid aortic valve with mild aortic stenosis.  His most recent echo in July 2022 showed a bicuspid aortic valve with fused non and right cusps with leaflet thickening and calcification.  The mean gradient was 22 mmHg with a peak gradient of 37 mmHg.  Aortic valve area was 1.2 cm with a dimensionless index of 0.3.  This was felt to be consistent with moderate aortic stenosis.  He has been undergoing work-up for kidney transplantation at [facility] and an echocardiogram there in June 2022  showed moderate to severe aortic stenosis with a mean gradient of 32 mmHg and a valve area of 0.8 cm.  He was told that he cannot have a kidney transplant at [facility] until his aortic stenosis was treated.   He does report having some exertional chest discomfort and shortness of breath.  He has noted his energy level is decreased.  His wife notes that he  is not as active as he had been.       Past Medical History:  Diagnosis Date   Anemia     Bicuspid aortic valve     CAD (coronary artery disease)      proximal LAD 50%, mid LAD 50-60%, mid to distal LAD 99%; mid circumflex 20%; proximal RCA 20%, distal RCA 50%; PDA 60-70%, proximal posterior AV groove 50%, proximal posterior lateral 50%.  There was a question of possible LVOT gradient;  s/p Resolute DES to mid-distal LAD 10/2010;  echocardiogram 11/17/10: EF 55-60%, mild LVH, grade 1 diastolic dysfunction, normal aortic valve, mild MR, PASP 32.    Chicken pox     Enlarged aorta ([name])     ESRD on peritoneal dialysis ([name])     Glaucoma     Gout      allopurinol 3110m, colchicine prn in past, has not had flares   Hypercholesterolemia      With hypertriglyceridemia   Hypertension     Kidney stones      hx hematuria as result. cystosopy normal   Mild aortic stenosis     PAF (paroxysmal atrial fibrillation) ([facility] [place]      [place]. Documented on event monitor 01/2016.   Premature atrial contractions     Sleep apnea      cpap nightly           Past Surgical History:  Procedure Laterality Date   CORONARY  STENT PLACEMENT   2012   CYSTOSCOPY/RETROGRADE/URETEROSCOPY   03/26/2012    Procedure: CYSTOSCOPY/RETROGRADE/URETEROSCOPY;  Surgeon: [name], MD;  Location: [facility];  Service: Urology;  Laterality: Bilateral;  CYSTOSCOPY BILATERAL RETROGRADE PYELOGRAM AND POSSIBLE URETEROSCOPY   RIGHT HEART CATH AND CORONARY ANGIOGRAPHY N/A 12/04/2020    Procedure: RIGHT HEART CATH AND CORONARY ANGIOGRAPHY;  Surgeon: Burnell Blanks, MD;  Location: [facility];  Service: Cardiovascular;  Laterality: N/A;           Family History  Problem Relation Age of Onset   Hypertension Father     CAD Father          CABG 22s   Kidney disease Father          Stage IV   Valvular heart disease Mother          AVR July 2015   Diabetes Paternal Grandmother     Colon cancer Neg Hx         Social History         Socioeconomic History   Marital status: Married      Spouse name: Not on file   Number of children: 4   Years of education: Not on file   Highest education level: Not on file  Occupational History   Occupation: Personal assistant: [employer]  Tobacco Use   Smoking status: Never   Smokeless tobacco: Never  Vaping Use   Vaping Use: Never used  Substance and Sexual Activity   Alcohol use: No   Drug use: No   Sexual activity: Not on file  Other Topics Concern   Not on file  Social History Narrative    Family: Married, 4 children, 2 grandkids- both with red hair (89 year old in 2016)         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 Authorizing Provider  allopurinol (ZYLOPRIM) 300 MG tablet TAKE 1 TABLET BY MOUTH EVERY DAY 07/08/20   Yes Marin Olp, MD  atorvastatin (LIPITOR) 40 MG tablet TAKE 1 TABLET BY MOUTH EVERY DAY 07/08/20   Yes Marin Olp, MD  docusate sodium (COLACE) 100 MG capsule Take 200 mg by mouth daily.     Yes [provider]  ELIQUIS 2.5 MG TABS tablet TAKE 1 TABLET BY MOUTH TWICE A DAY 12/01/20   Yes Burnell Blanks, MD  iron sucrose in sodium chloride 0.9 % 100 mL Iron Sucrose (Venofer) 11/23/20   Yes [provider]  labetalol (NORMODYNE) 100 MG tablet Take 100 mg by mouth 2 (two) times daily. 04/30/20   Yes [provider]  Methoxy PEG-Epoetin Beta (MIRCERA IJ) Inject  into the skin as needed. 06/15/20   Yes [provider]  torsemide (DEMADEX) 100 MG tablet  Take 100 mg by mouth daily. 04/13/20   Yes [provider]  calcitRIOL (ROCALTROL) 0.25 MCG capsule Take 0.5 mcg by mouth daily. Patient not taking: Reported on 01/13/2021 04/25/18     [provider]  calcium acetate (PHOSLO) 667 MG capsule Take 2,668 mg by mouth 3 (three) times daily. Patient not taking: Reported on 01/13/2021 05/26/20     [provider]            Current Outpatient Medications  Medication Sig Dispense Refill   allopurinol (ZYLOPRIM) 300 MG tablet TAKE 1 TABLET BY MOUTH EVERY DAY 90 tablet 2   atorvastatin (LIPITOR) 40 MG tablet TAKE 1 TABLET BY MOUTH EVERY DAY 90 tablet 2   docusate sodium (COLACE) 100 MG capsule Take 200 mg by mouth daily.       ELIQUIS 2.5 MG TABS tablet TAKE 1 TABLET BY MOUTH TWICE A DAY 60 tablet 5   iron sucrose in sodium chloride 0.9 % 100 mL Iron Sucrose (Venofer)       labetalol (NORMODYNE) 100 MG tablet Take 100 mg by mouth 2 (two) times daily.       Methoxy PEG-Epoetin Beta (MIRCERA IJ) Inject into the skin as needed.       torsemide (DEMADEX) 100 MG tablet Take 100 mg by mouth daily.       calcitRIOL (ROCALTROL) 0.25 MCG capsule Take 0.5 mcg by mouth daily. (Patient not taking: Reported on 01/13/2021)       calcium acetate (PHOSLO) 667 MG capsule Take 2,668 mg by mouth 3 (three) times daily. (Patient not taking: Reported on 01/13/2021)        No current facility-administered medications for this visit.      No Known Allergies       Review of Systems:               General:                      normal appetite, + decreased energy, no weight gain, no weight loss, no fever             Cardiac:                       + chest pain with exertion, no chest pain at rest, +SOB with moderate exertion, no resting SOB, no PND, no orthopnea, no palpitations, + arrhythmia, + atrial fibrillation, no LE edema, no dizzy spells, no  syncope             Respiratory:                 + exertional shortness of breath, no home oxygen, no productive cough, no dry cough, no bronchitis, no wheezing, no hemoptysis, no asthma, no pain with inspiration or cough, no sleep apnea, no CPAP at night             GI:                               no difficulty swallowing, no reflux, no frequent heartburn, no hiatal hernia, no abdominal pain, no constipation, no diarrhea, no hematochezia, no hematemesis, no melena             GU:  no dysuria,  no frequency, no urinary tract infection, no hematuria, no enlarged prostate, no kidney stones, + kidney disease             Vascular:                     no pain suggestive of claudication, no pain in feet, + leg cramps, no varicose veins, no DVT, no non-healing foot ulcer             Neuro:                         no stroke, no TIA's, no seizures, no headaches, no temporary blindness one eye,  no slurred speech, no peripheral neuropathy, no chronic pain, no instability of gait, no memory/cognitive dysfunction             Musculoskeletal:         no arthritis, no joint swelling, no myalgias, no difficulty walking, normal mobility              Skin:                            no rash, + itching, no skin infections, no pressure sores or ulcerations             Psych:                         no anxiety, no depression, no nervousness, no unusual recent stress             Eyes:                           no blurry vision, no floaters, no recent vision changes, + wears glasses or contacts             ENT:                            no hearing loss, no loose or painful teeth, no dentures, last saw dentist every 6 months             Hematologic:               no easy bruising, no abnormal bleeding, no clotting disorder, no frequent epistaxis             Endocrine:                   no diabetes, does not check CBG's at hom                            Physical Exam:               BP 123/85    Pulse 75   Resp 20   Ht 5' 8" (1.727 m)   Wt 208 lb (94.3 kg)   SpO2 99% Comment: RA  BMI 31.63 kg/m              General:                      well-appearing             HEENT:  Unremarkable, NCAT, PERLA, EOMI             Neck:                           no JVD, no bruits, no adenopathy              Chest:                          clear to auscultation, symmetrical breath sounds, no wheezes, no rhonchi              CV:                              RRR, 3/6 systolic murmur RSB, no diastolic murmur             Abdomen:                    soft, non-tender, no masses              Extremities:                 warm, well-perfused, pulses palpable at ankle, no lower extremity edema             Rectal/GU                   Deferred             Neuro:                         Grossly non-focal and symmetrical throughout             Skin:                            Clean and dry, no rashes, no breakdown   Diagnostic Tests:   ECHOCARDIOGRAM REPORT         Patient Name:   Daniel Solomon Date of Exam: 09/25/2020  Medical Rec #:  [id number]      Height:       68.0 in  Accession #:    [id number]     Weight:       220.0 lb  Date of Birth:  January 31, 1961     BSA:          2.128 m  Patient Age:    20 years       BP:           130/82 mmHg  Patient Gender: M              HR:           62 bpm.  Exam Location:  [address]   Procedure: 2D Echo, Color Doppler and Cardiac Doppler   Indications:    I35.9 AVD     History:        Patient has prior history of Echocardiogram examinations,  most                  recent 07/28/2019. CAD, Aortic Valve Disease and Bicusid  aortic                  valve, Arrythmias:Atrial Fibrillation; Risk  Factors:Hypertension  and Dyslipidemia.     Sonographer:    Wilford Sports Rodgers-Jones RDCS  Referring Phys: [place]     1. Left ventricular ejection fraction, by estimation, is 60 to 65%. The  left  ventricle has normal function. The left ventricle has no regional  wall motion abnormalities. There is mild left ventricular hypertrophy.  Left ventricular diastolic parameters  are indeterminate.   2. Right ventricular systolic function is normal. The right ventricular  size is normal.   3. Left atrial size was moderately dilated.   4. The mitral valve is abnormal. Trivial mitral valve regurgitation. No  evidence of mitral stenosis.   5. Since study 07/01/19 mean gradient increased from 15->22.2 mmHg peak  25.8 -> 36.8 mmhg Better viewed on this study likely bicuspid with  raphe/fused non and right cusps . The aortic valve is normal in structure.  There is moderate calcification of the  aortic valve. There is moderate thickening of the aortic valve. Aortic  valve regurgitation is not visualized. Moderate aortic valve stenosis.   6. Aortic dilatation noted. There is mild dilatation of the ascending  aorta, measuring 42 mm.   7. The inferior vena cava is normal in size with greater than 50%  respiratory variability, suggesting right atrial pressure of 3 mmHg.   FINDINGS   Left Ventricle: Left ventricular ejection fraction, by estimation, is 60  to 65%. The left ventricle has normal function. The left ventricle has no  regional wall motion abnormalities. The left ventricular internal cavity  size was normal in size. There is   mild left ventricular hypertrophy. Left ventricular diastolic parameters  are indeterminate.   Right Ventricle: The right ventricular size is normal. No increase in  right ventricular wall thickness. Right ventricular systolic function is  normal.   Left Atrium: Left atrial size was moderately dilated.   Right Atrium: Right atrial size was normal in size.   Pericardium: There is no evidence of pericardial effusion.   Mitral Valve: The mitral valve is abnormal. There is mild thickening of  the mitral valve leaflet(s). There is mild calcification of the mitral   valve leaflet(s). Mild mitral annular calcification. Trivial mitral valve  regurgitation. No evidence of mitral  valve stenosis.   Tricuspid Valve: The tricuspid valve is normal in structure. Tricuspid  valve regurgitation is trivial. No evidence of tricuspid stenosis.   Aortic Valve: Since study 07/01/19 mean gradient increased from 15->22.2  mmHg peak 25.8 -> 36.8 mmhg Better viewed on this study likely bicuspid  with raphe/fused non and right cusps. The aortic valve is normal in  structure. There is moderate calcification  of the aortic valve. There is moderate thickening of the aortic valve.  Aortic valve regurgitation is not visualized. Moderate aortic stenosis is  present. Aortic valve mean gradient measures 22.2 mmHg. Aortic valve peak  gradient measures 36.8 mmHg. Aortic   valve area, by VTI measures 1.27 cm.   Pulmonic Valve: The pulmonic valve was normal in structure. Pulmonic valve  regurgitation is not visualized. No evidence of pulmonic stenosis.   Aorta: Aortic dilatation noted. There is mild dilatation of the ascending  aorta, measuring 42 mm.   Venous: The inferior vena cava is normal in size with greater than 50%  respiratory variability, suggesting right atrial pressure of 3 mmHg.   IAS/Shunts: No atrial level shunt detected by color flow Doppler.      LEFT VENTRICLE  PLAX 2D  LVIDd:  3.90 cm  Diastology  LVIDs:         2.60 cm  LV e' medial:    9.86 cm/s  LV PW:         1.20 cm  LV E/e' medial:  10.1  LV IVS:        1.00 cm  LV e' lateral:   13.47 cm/s  LVOT diam:     2.30 cm  LV E/e' lateral: 7.4  LV SV:         78  LV SV Index:   37  LVOT Area:     4.15 cm      RIGHT VENTRICLE  RV Basal diam:  4.60 cm  RV S prime:     13.00 cm/s  TAPSE (M-mode): 2.4 cm   LEFT ATRIUM             Index       RIGHT ATRIUM           Index  LA diam:        4.70 cm 2.21 cm/m  RA Area:     14.90 cm  LA Vol (A2C):   52.9 ml 24.86 ml/m RA Volume:   42.90 ml   20.16 ml/m  LA Vol (A4C):   89.3 ml 41.96 ml/m  LA Biplane Vol: 75.3 ml 35.38 ml/m   AORTIC VALVE  AV Area (Vmax):    1.30 cm  AV Area (Vmean):   1.20 cm  AV Area (VTI):     1.27 cm  AV Vmax:           303.40 cm/s  AV Vmean:          223.000 cm/s  AV VTI:            0.615 m  AV Peak Grad:      36.8 mmHg  AV Mean Grad:      22.2 mmHg  LVOT Vmax:         95.05 cm/s  LVOT Vmean:        64.600 cm/s  LVOT VTI:          0.188 m  LVOT/AV VTI ratio: 0.30     AORTA  Ao Root diam: 3.10 cm  Ao Asc diam:  4.20 cm   MV E velocity: 99.07 cm/s                             SHUNTS                             Systemic VTI:  0.19 m                             Systemic Diam: 2.30 cm   Jenkins Rouge MD  Electronically signed by Jenkins Rouge MD  Signature Date/Time: 09/25/2020/3:43:06 PM         Final         Physicians   Panel Physicians Referring Physician Case Authorizing Physician  Burnell Blanks, MD (Primary)        Procedures   RIGHT HEART CATH AND CORONARY ANGIOGRAPHY    Conclusion       Prox LAD to Mid LAD lesion is 50% stenosed.   Mid LAD lesion is 50% stenosed.   Dist LAD-1 lesion is 20% stenosed.   Dist LAD-2 lesion is  50% stenosed.   1st Mrg lesion is 30% stenosed.   Prox Cx to Mid Cx lesion is 40% stenosed.   Dist RCA lesion is 70% stenosed.   Moderate diffuse LAD disease. Patent mid to distal LAD stent Mild mid Circumflex disease Large dominant RCA with moderately severe distal stenosis at the bifurcation into the PDA and posterolateral artery.    Recommendations: Will continue workup for AVR vs TAVR. Will arrange CT scans next. His distal RCA stenosis could be addressed with PCI but given his lack of angina or ischemic symptoms this would be best managed medically. Given his young age, will still need to consider surgical AVR as well. Further planning will be based on his CT scans.    Procedural Details   Technical Details Indication: Moderately  severe bicuspid aortic stenosis, known CAD without angina. Planning for renal transplant.   Procedure: The risks, benefits, complications, treatment options, and expected outcomes were discussed with the patient. The patient and/or family concurred with the proposed plan, giving informed consent. The patient was brought to the cath lab after IV hydration was given. The patient was further sedated with Versed and Fentanyl. The antecubital IV catheter was prepped and draped. This was changed for a 5 [place] sheath. Right heart catheterization performed with a balloon tipped catheter. The right groin was prepped and draped in the usual manner. Using the modified Seldinger access technique, a 5 French sheath was placed in the right femoral artery using u/s guidance with a micropuncture kit. Standard diagnostic catheters were used to perform selective coronary angiography. I did not cross the aortic valve. Mynx closure device placed in the right femoral artery  There were no immediate complications. The patient was taken to the recovery area in stable condition.     Estimated blood loss <50 mL.   During this procedure medications were administered to achieve and maintain moderate conscious sedation while the patient's heart rate, blood pressure, and oxygen saturation were continuously monitored and I was present face-to-face 100% of this time.    Medications (Filter: Administrations occurring from [phone] to 1002 on 12/04/20)  important  Continuous medications are totaled by the amount administered until 12/04/20 1002.    lidocaine (PF) (XYLOCAINE) 1 % injection (mL) Total volume:  12 mL Date/Time Rate/Dose/Volume Action    12/04/20 0914 2 mL Given    0916 10 mL Given      fentaNYL (SUBLIMAZE) injection (mcg) Total dose:  50 mcg Date/Time Rate/Dose/Volume Action    12/04/20 0914 25 mcg Given    0924 25 mcg Given      midazolam (VERSED) injection (mg) Total dose:  2 mg Date/Time Rate/Dose/Volume  Action    12/04/20 0915 1 mg Given    0924 1 mg Given      Heparin (Porcine) in NaCl 1000-0.9 UT/500ML-% SOLN (mL) Total volume:  1,000 mL Date/Time Rate/Dose/Volume Action    12/04/20 0915 500 mL Given    0915 500 mL Given      iohexol (OMNIPAQUE) 350 MG/ML injection (mL) Total volume:  70 mL Date/Time Rate/Dose/Volume Action    12/04/20 0949 70 mL Given      Sedation Time   Sedation Time Physician-1: 31 minutes 45 seconds Contrast   Medication Name Total Dose  iohexol (OMNIPAQUE) 350 MG/ML injection 70 mL    Radiation/Fluoro   Fluoro time: 12 (min) DAP: 28786 (mGycm2) Cumulative Air Kerma: 448 (mGy) Coronary Findings   Diagnostic Dominance: Right Left Anterior Descending  Vessel is large.  Prox LAD to Mid LAD lesion is 50% stenosed.  Mid LAD lesion is 50% stenosed.  Dist LAD-1 lesion is 20% stenosed. The lesion was previously treated using a drug eluting stent over 2 years ago.  Dist LAD-2 lesion is 50% stenosed.    Left Circumflex  Prox Cx to Mid Cx lesion is 40% stenosed.    First Obtuse Marginal Branch  1st Mrg lesion is 30% stenosed.    Right Coronary Artery  Vessel is large.  Dist RCA lesion is 70% stenosed.    Intervention    No interventions have been documented.    Coronary Diagrams   Diagnostic Dominance: Right Intervention   Implants         Vascular Products   Closure Mynx Control 31f-- [id number]- Implanted Inventory item: CLOSURE [facility] Medical CenterCONTROL 84F Model/Cat number: [id number] Manufacturer: CFidelityLot number: [id number] Device identifier: [id number] identifier type: GS1  GUDID Information   Request status Successful      Brand name: MYNX CONTROL Version/Model: [id number] Company name: [employer]co[employer] info as of 12/04/20: MR Safe  Contains dry or latex rubber: No      GMDN P.T. name: Wound hydrogel dressing, non-antimicrobial        As of 12/04/2020   Status: Implanted          Syngo Images     Show images for CARDIAC CATHETERIZATION Images on Long Term Storage    Show images for TStevan, Eberwein"KClaiborne Billings Link to Procedure Log   Procedure Log    Hemo Data   Flowsheet Row Most Recent Value  Fick Cardiac Output 8.36 L/min  Fick Cardiac Output Index 3.96 (L/min)/BSA  RA A Wave 4 mmHg  RA V Wave 3 mmHg  RA Mean 1 mmHg  RV Systolic Pressure 22 mmHg  RV Diastolic Pressure 1 mmHg  RV EDP 2 mmHg  PA Systolic Pressure 19 mmHg  PA Diastolic Pressure 9 mmHg  PA Mean 15 mmHg  PW A Wave 9 mmHg  PW V Wave 12 mmHg  PW Mean 8 mmHg  AO Systolic Pressure 1759mmHg  AO Diastolic Pressure 72 mmHg  AO Mean 91 mmHg  QP/QS 1  TPVR Index 3.79 HRUI  TSVR Index 22.99 HRUI  PVR SVR Ratio 0.08  TPVR/TSVR Ratio 0.16      CLINICAL DATA:  Severe Aortic Stenosis.   EXAM: Cardiac TAVR CT   TECHNIQUE: A non-contrast, gated CT scan was obtained with axial slices of 3 mm through the heart for aortic valve calcium scoring. A 120 kV retrospective, gated, contrast cardiac scan was obtained. Gantry rotation speed was 250 msecs and collimation was 0.6 mm. Nitroglycerin was not given. The 3D data set was reconstructed in 5% intervals of the 0-95% of the R-R cycle. Systolic and diastolic phases were analyzed on a dedicated workstation using MPR, MIP, and VRT modes. The patient received 100 cc of contrast.   FINDINGS: Image quality: Excellent.   Noise artifact is: Limited.   Valve Morphology: Bicuspid aortic valve with fusion of the RCC/NCC with raphe (Sievers type 1). There is bulky calcification of the raphe. Leaflets are severely calcified with restricted leaflet movement in systole.   Aortic Valve Calcium score: 2103   Aortic annular dimension:   Phase assessed: 25%   Annular area: 513 mm2   Annular perimeter: 82.0 mm   Max diameter: 27.8 mm   Min diameter: 24.2 mm   Annular and  subannular calcification: None.   Optimal coplanar projection: LAO 4 CRA 8   Coronary Artery  Height above Annulus:   Left Main: 14.3 mm   Right Coronary: 11.2 mm   Sinus of Valsalva Measurements:   Maximum diameter: 36 mm   Inter-commissural diameter: 30 mm   Sinus of Valsalva Height:   Non-coronary: 19.0 mm   Right-coronary: 19.0 mm   Left-coronary: 26.7 mm   Sinotubular Junction: 33 mm   Ascending Thoracic Aorta: Aneurysmal up to 42 mm (double oblique).   Coronary Arteries: Normal coronary origin. Right dominance. The study was performed without use of NTG and is insufficient for plaque evaluation. Please refer to recent cardiac catheterization for coronary assessment. 3-vessel coronary calcifications noted.   Cardiac Morphology:   Right Atrium: Right atrial size is within normal limits.   Right Ventricle: The right ventricular cavity is within normal limits.   Left Atrium: Left atrial size is normal in size with no left atrial appendage filling defect.   Left Ventricle: The ventricular cavity size is within normal limits. There are no stigmata of prior infarction. There is no abnormal filling defect.   Pulmonary arteries: Normal in size without proximal filling defect.   Pulmonary veins: Normal pulmonary venous drainage.   Pericardium: Normal thickness with no significant effusion or calcium present.   Mitral Valve: The mitral valve is normal structure without significant calcification.   Extra-cardiac findings: See attached radiology report for non-cardiac structures.   IMPRESSION: 1. Bicuspid aortic valve with fusion of the RCC/NCC with raphe (Sievers type 1). Bulky calcification of the raphe noted.   2. Annular measurements support a 26 mm S3 TAVR (513 mm2).   3. No significant annular or subannular calcifications.   4. Sufficient coronary to annulus distance.   5. Optimal Fluoroscopic Angle for Delivery: LAO 4 CRA 8   6. 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   [place] T. Audie Box, MD      Electronically Signed   By: Eleonore Chiquito M.D.   On: 12/23/2020 09:06    Addended by Geralynn Rile, MD on 12/23/2020  9:09 AM    Study Result   Narrative & Impression  EXAM: OVER-READ INTERPRETATION  CT CHEST   The following report is an over-read performed by radiologist Dr. Vinnie Langton of [facility], [place] on 12/23/2020. This over-read does not include interpretation of cardiac or coronary anatomy or pathology. The coronary calcium score/coronary CTA interpretation by the cardiologist is attached.   COMPARISON:  None.   FINDINGS: Extracardiac findings will be described separately under dictation for contemporaneously obtained CTA chest, abdomen and pelvis.   IMPRESSION: Please see separate dictation for contemporaneously obtained CTA chest, abdomen and pelvis dated 12/22/2020 for full description of relevant extracardiac findings.   Electronically Signed: By: Vinnie Langton M.D. On: 12/23/2020 05:14      Study Result   Narrative & Impression  CLINICAL DATA:  60 year old male with history of severe aortic stenosis. Preprocedural study prior to potential transcatheter aortic valve replacement (TAVR) procedure.   EXAM: CT ANGIOGRAPHY CHEST, ABDOMEN AND PELVIS   TECHNIQUE: Multidetector CT imaging through the chest, abdomen and pelvis was performed using the standard protocol during bolus administration of intravenous contrast. Multiplanar reconstructed images and MIPs were obtained and reviewed to evaluate the vascular anatomy.   CONTRAST:  1107m OMNIPAQUE IOHEXOL 350 MG/ML SOLN   COMPARISON:  None.   FINDINGS: CTA CHEST FINDINGS   Cardiovascular: Heart size is borderline  enlarged. There is no significant pericardial fluid, thickening or pericardial calcification. There is aortic atherosclerosis, as well as atherosclerosis of the great vessels of the mediastinum and the coronary arteries, including calcified atherosclerotic plaque in  the left main, left anterior descending, left circumflex and right coronary arteries. Severe thickening and calcification of the aortic valve.   Mediastinum/Lymph Nodes: No pathologically enlarged mediastinal or hilar lymph nodes. Esophagus is unremarkable in appearance. No axillary lymphadenopathy.   Lungs/Pleura: No acute consolidative airspace disease. No pleural effusions. A few scattered small pulmonary nodules are noted in the lungs bilaterally, largest of which is in the medial segment of the right middle lobe measuring 4 mm (axial image 86 of series 5).   Musculoskeletal/Soft Tissues: There are no aggressive appearing lytic or blastic lesions noted in the visualized portions of the skeleton.   CTA ABDOMEN AND PELVIS FINDINGS   Hepatobiliary: No suspicious cystic or solid hepatic lesions. No intra or extrahepatic biliary ductal dilatation. Gallbladder is normal in appearance.   Pancreas: No pancreatic mass. No pancreatic ductal dilatation. No pancreatic or peripancreatic fluid collections or inflammatory changes.   Spleen: Unremarkable.   Adrenals/Urinary Tract: Severe atrophy of the left kidney. Moderate atrophy of the right kidney. Multiple low-attenuation lesions in both kidneys, compatible with simple cysts. In addition, in the lower pole of the left kidney (axial image 146 of series 4) there is an exophytic 3.0 cm intermediate attenuation (43 HU) lesion which is increased in size compared to prior abdominal MRI 07/09/2019. No hydroureteronephrosis. Urinary bladder is normal in appearance. Bilateral adrenal glands are normal in appearance.   Stomach/Bowel: The appearance of the stomach is normal. No pathologic dilatation of small bowel or colon. Normal appendix.   Vascular/Lymphatic: Aortic atherosclerosis, with vascular findings and measurements pertinent to potential TAVR procedure, as detailed below. No aneurysm or dissection noted in the abdominal or  pelvic vasculature. No lymphadenopathy noted in the abdomen or pelvis.   Reproductive: Prostate gland and seminal vesicles are unremarkable in appearance.   Other: Tenckhoff peritoneal dialysis catheter with tip coiled in the low anatomic pelvis. Small volume of fluid in the peritoneal cavity, presumably peritoneal dialysate. No pneumoperitoneum.   Musculoskeletal: There are no aggressive appearing lytic or blastic lesions noted in the visualized portions of the skeleton.   VASCULAR MEASUREMENTS PERTINENT TO TAVR:   AORTA:   Minimal Aortic Diameter-15 x 17 mm   Severity of Aortic Calcification-moderate   RIGHT PELVIS:   Right Common Iliac Artery -   Minimal Diameter-9.0 x 9.9 mm   Tortuosity-mild   Calcification-moderate   Right External Iliac Artery -   Minimal Diameter-9.4 x 9.9 mm   Tortuosity-moderate   Calcification-minimal   Right Common Femoral Artery -   Minimal Diameter-8.9 x 9.1 mm   Tortuosity-mild   Calcification-mild   LEFT PELVIS:   Left Common Iliac Artery -   Minimal Diameter-10.8 x 11.1 mm   Tortuosity-moderate   Calcification-mild   Left External Iliac Artery -   Minimal Diameter-9.9 x 9.6 mm   Tortuosity-mild   Calcification-none   Left Common Femoral Artery -   Minimal Diameter-9.9 x 8.4 mm   Tortuosity-mild   Calcification-mild   Review of the MIP images confirms the above findings.   IMPRESSION: 1. Vascular findings and measurements pertinent to potential TAVR procedure, as detailed above. 2. Severe thickening and calcification of the aortic valve, compatible with reported clinical history of severe aortic stenosis. 3. Enlarging exophytic intermediate attenuation lesion in the lower pole of the  left kidney. Although does may simply represent a proteinaceous/hemorrhagic cysts, further characterization with nonemergent MRI of the abdomen with and without IV gadolinium is recommended in the near future to better  characterize this finding and exclude the possibility of a small renal neoplasm. 4. Aortic atherosclerosis, in addition to left main and 3 vessel coronary artery disease. Please note that although the presence of coronary artery calcium documents the presence of coronary artery disease, the severity of this disease and any potential stenosis cannot be assessed on this non-gated CT examination. Assessment for potential risk factor modification, dietary therapy or pharmacologic therapy may be warranted, if clinically indicated. 5. Small pulmonary nodules measuring 4 mm or less in size, nonspecific, but statistically likely benign. No follow-up needed if patient is low-risk (and has no known or suspected primary neoplasm). Non-contrast chest CT can be considered in 12 months if patient is high-risk. This recommendation follows the consensus statement: Guidelines for Management of Incidental Pulmonary Nodules Detected on CT Images: From the Fleischner Society 2017; Radiology 2017; 284:228-243. 6. Moderate right and severe left renal atrophy. 7. Additional incidental findings, as above.     Electronically Signed   By: Vinnie Langton M.D.   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  I agree that aortic valve replacement is indicated prior to kidney transplantation to decrease his risk of perioperative complications and progressive left ventricular deterioration.  I have  personally reviewed his 2D echocardiogram, cardiac catheterization, and CTA studies.  His echocardiogram shows a calcified and thickened bicuspid aortic valve with a mean gradient of 22 mmHg and a peak gradient of 37 mmHg with significant worsening over the past year.  There is restricted leaflet mobility and I think his valve looks like a severely stenotic valve.  The mean gradient across aortic valve was measured at 32 mmHg by echocardiogram at [facility] in June 2022.  Cardiac catheterization shows moderate diffuse LAD disease with a patent mid to distal LAD stent.  There is mild left circumflex disease.  The RCA is a large dominant vessel with moderately severe distal stenosis at the bifurcation of the PDA and PL.  I think his coronary disease could be managed medically at this time.  His risk for open surgical aortic valve replacement and CABG would be increased due to his comorbid risk factors and I think TAVR would be a reasonable alternative for treating his aortic stenosis prior to kidney transplantation.  His gated cardiac CTA shows anatomy suitable for TAVR using a SAPIEN 3 valve.  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  His abdominal and pelvic CTA shows adequate pelvic vascular anatomy to allow transfemoral insertion.   The patient was counseled at length regarding treatment alternatives for management of severe symptomatic aortic stenosis. The risks and benefits of surgical intervention has been discussed in detail. Long-term prognosis with medical therapy was discussed. Alternative approaches such as conventional surgical aortic valve replacement, transcatheter aortic valve replacement, and palliative medical therapy were compared and contrasted at  length. This discussion was placed in the context of the patient's own specific clinical presentation and past medical history. All of his questions have been addressed.    Following the decision to proceed with transcatheter aortic  valve replacement, a discussion was held regarding what types of management strategies would be attempted intraoperatively in the event of life-threatening complications, including whether or not the patient would be considered a candidate for the use of cardiopulmonary bypass and/or conversion to open sternotomy for attempted surgical intervention.  I think he would be a candidate for emergent sternotomy to manage any intraoperative complications.  The patient is aware of the fact that transient use of cardiopulmonary bypass may be necessary. The patient has been advised of a variety of complications that might develop including but not limited to risks of death, stroke, paravalvular leak, aortic dissection or other major vascular complications, aortic annulus rupture, device embolization, cardiac rupture or perforation, mitral regurgitation, acute myocardial infarction, arrhythmia, heart block or bradycardia requiring permanent pacemaker placement, congestive heart failure, respiratory failure, renal failure, pneumonia, infection, other late complications related to structural valve deterioration or migration, or other complications that might ultimately cause a temporary or permanent loss of functional independence or other long term morbidity. The patient provides full informed consent for the procedure as described and all questions were answered.     Plan:   Transfemoral transcatheter aortic valve replacement using a SAPIEN 3 valve.        Gaye Pollack, MD

## 2021-01-18 NOTE — Progress Notes (Signed)
Patient voiced understanding of new arrival time of 64

## 2021-01-18 NOTE — Progress Notes (Signed)
Anesthesia Chart Review:   Case: [id number] Date/Time: 01/19/21 1030   Procedures:      TRANSCATHETER AORTIC VALVE REPLACEMENT, TRANSFEMORAL (Chest)     INTRAOPERATIVE TRANSTHORACIC ECHOCARDIOGRAM   Anesthesia type: Monitor Anesthesia Care   Pre-op diagnosis: Severe Aortic Stenosis   Location: [facility] OR ROOM 16 / [place] [facility]   Surgeons: Burnell Blanks, MD     CT surgeon: Gilford Raid, MD  DISCUSSION: Patient is a 60 year old male scheduled for the above procedure.   History includes never smoker, HTN, hypercholesterolemia, CAD (s/p DES mid-distal LAD 11/17/10), bicuspid AV with severe AS and mildly dilation of ascending thoracic aorta (42 mm 09/25/20 echo), afib (diagnosed 2017), ESRD (peritoneal dialysis, PD catheter placed 03/06/20), anemia, OSA (uses CPAP), glaucoma, right eye retinal artery occlusion (2017), gout. BMI is consistent with obesity.  Last Eliquis 01/13/21.  01/15/2021 presurgical COVID-19 test negative.  Anesthesia team to evaluate on the day of surgery.   VS: BP 113/70   Pulse 73   Temp 36.5 C (Oral)   Resp 19   Ht 5\' 8"  (1.727 m)   Wt 99.5 kg   SpO2 98%   BMI 33.36 kg/m    PROVIDERS: Marin Olp, MD is PCP  Lauree Chandler, MD is structural heart cardiologist Chesley Mires, MD is pulmonologist ([facility]) Pearson Grippe, MD is nephrologist is with [facility]. His Renal Transplant evaluation at [facility] was put on hold until he had cardiac issues addressed.    LABS: Preoperative labs noted. Cr 13.60, K 3.9, H/H 9.5/30.2, PLT 116, down from 136-205 when compared to labs in [facility] over the past year. He has ESRD. He will need ISTAT on the day of surgery.  T&S done with labs.  (all labs ordered are listed, but only abnormal results are displayed)  Labs Reviewed  CBC - Abnormal; Notable for the following components:      Result Value   RBC 2.84 (*)    Hemoglobin 9.5 (*)    HCT 30.2 (*)    MCV 106.3 (*)    Platelets 116 (*)    All other  components within normal limits  COMPREHENSIVE METABOLIC PANEL - Abnormal; Notable for the following components:   BUN 67 (*)    Creatinine, Ser 13.60 (*)    Total Protein 6.2 (*)    Albumin 3.2 (*)    AST 12 (*)    GFR, Estimated 4 (*)    All other components within normal limits  URINALYSIS, ROUTINE W REFLEX MICROSCOPIC - Abnormal; Notable for the following components:   APPearance HAZY (*)    Glucose, UA 50 (*)    Hgb urine dipstick MODERATE (*)    Protein, ur 100 (*)    RBC / HPF >50 (*)    Bacteria, UA FEW (*)    All other components within normal limits  BLOOD GAS, ARTERIAL - Abnormal; Notable for the following components:   pO2, Arterial 122 (*)    Allens test (pass/fail) BRACHIAL ARTERY (*)    All other components within normal limits  BRAIN NATRIURETIC PEPTIDE - Abnormal; Notable for the following components:   B Natriuretic Peptide 123.8 (*)    All other components within normal limits  SARS CORONAVIRUS 2 (TAT 6-24 HRS)  SURGICAL PCR SCREEN  PROTIME-INR  TYPE AND SCREEN     IMAGES: CXR 01/15/21: FINDINGS: Heart size within normal limits. Atherosclerotic calcification of the aortic knob. No focal airspace consolidation, pleural effusion, or pneumothorax. No acute bony findings.  IMPRESSION: No active cardiopulmonary disease.  CTA Chest/abd/pelvis 12/22/20: IMPRESSION: 1. Vascular findings and measurements pertinent to potential TAVR procedure, as detailed above. 2. Severe thickening and calcification of the aortic valve, compatible with reported clinical history of severe aortic stenosis. 3. Enlarging exophytic intermediate attenuation lesion in the lower pole of the left kidney. Although does may simply represent a proteinaceous/hemorrhagic cysts, further characterization with nonemergent MRI of the abdomen with and without IV gadolinium is recommended in the near future to better characterize this finding and exclude the possibility of a small renal  neoplasm. 4. Aortic atherosclerosis, in addition to left main and 3 vessel coronary artery disease. Please note that although the presence of coronary artery calcium documents the presence of coronary artery disease, the severity of this disease and any potential stenosis cannot be assessed on this non-gated CT examination. Assessment for potential risk factor modification, dietary therapy or pharmacologic therapy may be warranted, if clinically indicated. 5. Small pulmonary nodules measuring 4 mm or less in size, nonspecific, but statistically likely benign. No follow-up needed if patient is low-risk (and has no known or suspected primary neoplasm). Non-contrast chest CT can be considered in 12 months if patient is high-risk. This recommendation follows the consensus statement: Guidelines for Management of Incidental Pulmonary Nodules Detected on CT Images: From the Fleischner Society 2017; Radiology 2017; 284:228-243. 6. Moderate right and severe left renal atrophy. 7. Additional incidental findings, as above. [See full report]   EKG: 01/15/21: Atrial fibrillation at 73 bpm  Minimal voltage criteria for LVH, may be normal variant ( Sokolow-Lyon ) Abnormal ECG Confirmed by Cherlynn Kaiser [phone]) on 01/16/2021 12:00:02 PM   CV: CT Cardiac 12/22/20: IMPRESSION: 1. Bicuspid aortic valve with fusion of the RCC/NCC with raphe (Sievers type 1). Bulky calcification of the raphe noted. 2. Annular measurements support a 26 mm S3 TAVR (513 mm2). 3. No significant annular or subannular calcifications. 4. Sufficient coronary to annulus distance. 5. Optimal Fluoroscopic Angle for Delivery: LAO 4 CRA 8 6. 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   RHC/LHC 12/04/20:   Prox LAD to Mid LAD lesion is 50% stenosed.   Mid LAD lesion is 50% stenosed.   Dist LAD-1 lesion is 20% stenosed.   Dist LAD-2 lesion is 50% stenosed.   1st Mrg lesion is 30% stenosed.    Prox Cx to Mid Cx lesion is 40% stenosed.   Dist RCA lesion is 70% stenosed.   Moderate diffuse LAD disease. Patent mid to distal LAD stent Mild mid Circumflex disease Large dominant RCA with moderately severe distal stenosis at the bifurcation into the PDA and posterolateral artery.    Recommendations: Will continue workup for AVR vs TAVR. Will arrange CT scans next. His distal RCA stenosis could be addressed with PCI but given his lack of angina or ischemic symptoms this would be best managed medically. Given his young age, will still need to consider surgical AVR as well. Further planning will be based on his CT scans.    Echo 09/25/20: IMPRESSIONS   1. Left ventricular ejection fraction, by estimation, is 60 to 65%. The  left ventricle has normal function. The left ventricle has no regional  wall motion abnormalities. There is mild left ventricular hypertrophy.  Left ventricular diastolic parameters  are indeterminate.   2. Right ventricular systolic function is normal. The right ventricular  size is normal.   3. Left atrial size was moderately dilated.   4. The mitral valve is abnormal.  Trivial mitral valve regurgitation. No  evidence of mitral stenosis.   5. Since study 07/01/19 mean gradient increased from 15->22.2 mmHg peak  25.8 -> 36.8 mmhg Better viewed on this study likely bicuspid with  raphe/fused non and right cusps . The aortic valve is normal in structure.  There is moderate calcification of the  aortic valve. There is moderate thickening of the aortic valve. Aortic  valve regurgitation is not visualized. Moderate aortic valve stenosis.   6. Aortic dilatation noted. There is mild dilatation of the ascending  aorta, measuring 42 mm.   7. The inferior vena cava is normal in size with greater than 50%  respiratory variability, suggesting right atrial pressure of 3 mmHg.    Cardiac event monitor 01/28/16: Sinus rhythm with premature atrial contractions Atrial  fibrillation with controlled heart rate   US Carotid 01/28/16: Impressions: Heterogeneous plaque, bilaterally. 1 to 39% bilateral ICA stenosis. Normal subclavian arteries bilaterally. Patent bilateral vertebral arteries with antegrade flow, with abnormal waveforms in the right vertebral artery.   Past Medical History:  Diagnosis Date   Anemia    Bicuspid aortic valve    CAD (coronary artery disease)    proximal LAD 50%, mid LAD 50-60%, mid to distal LAD 99%; mid circumflex 20%; proximal RCA 20%, distal RCA 50%; PDA 60-70%, proximal posterior AV groove 50%, proximal posterior lateral 50%.  There was a question of possible LVOT gradient;  s/p Resolute DES to mid-distal LAD 10/2010;  echocardiogram 11/17/10: EF 55-60%, mild LVH, grade 1 diastolic dysfunction, normal aortic valve, mild MR, PASP 32.    Chicken pox    Enlarged aorta ([name])    ESRD on peritoneal dialysis ([name])    Glaucoma    Gout    allopurinol 300mg , colchicine prn in past, has not had flares   Hypercholesterolemia    With hypertriglyceridemia   Hypertension    Kidney stones    hx hematuria as result. cystosopy normal   Mild aortic stenosis    PAF (paroxysmal atrial fibrillation) ([place])    a. Documented on event monitor 01/2016.   Premature atrial contractions    Sleep apnea    cpap nightly    Past Surgical History:  Procedure Laterality Date   CORONARY STENT PLACEMENT  2012   CYSTOSCOPY/RETROGRADE/URETEROSCOPY  03/26/2012   Procedure: CYSTOSCOPY/RETROGRADE/URETEROSCOPY;  Surgeon: [name], MD;  Location: [facility];  Service: Urology;  Laterality: Bilateral;  CYSTOSCOPY BILATERAL RETROGRADE PYELOGRAM AND POSSIBLE URETEROSCOPY   RIGHT HEART CATH AND CORONARY ANGIOGRAPHY N/A 12/04/2020   Procedure: RIGHT HEART CATH AND CORONARY ANGIOGRAPHY;  Surgeon: Burnell Blanks, MD;  Location: [facility];  Service: Cardiovascular;  Laterality: N/A;    MEDICATIONS:  allopurinol (ZYLOPRIM) 300  MG tablet   atorvastatin (LIPITOR) 40 MG tablet   AURYXIA 1 GM 210 MG(Fe) tablet   docusate sodium (COLACE) 100 MG capsule   ELIQUIS 2.5 MG TABS tablet   gentamicin ointment (GARAMYCIN) 0.1 %   iron sucrose in sodium chloride 0.9 % 100 mL   labetalol (NORMODYNE) 100 MG tablet   Methoxy PEG-Epoetin Beta (MIRCERA IJ)   torsemide (DEMADEX) 100 MG tablet   No current facility-administered medications for this encounter.    Myra Gianotti, PA-C Surgical Short Stay/Anesthesiology [facility] Phone [phone] [facility] Phone [phone] 01/18/2021 10:25 AM

## 2021-01-19 ENCOUNTER — Encounter (HOSPITAL_COMMUNITY): Payer: Self-pay | Admitting: Cardiovascular Disease

## 2021-01-19 ENCOUNTER — Other Ambulatory Visit: Payer: Self-pay

## 2021-01-19 ENCOUNTER — Encounter (HOSPITAL_COMMUNITY)
Admission: RE | Disposition: A | Discharge status: Home / Self Care | Payer: Self-pay | Source: Home / Self Care | Attending: Cardiovascular Disease

## 2021-01-19 ENCOUNTER — Inpatient Hospital Stay (HOSPITAL_COMMUNITY): Payer: Managed Care, Other (non HMO)

## 2021-01-19 ENCOUNTER — Other Ambulatory Visit: Payer: Self-pay | Admitting: Cardiology

## 2021-01-19 ENCOUNTER — Inpatient Hospital Stay (HOSPITAL_COMMUNITY): Payer: Managed Care, Other (non HMO) | Admitting: Physician Assistant

## 2021-01-19 ENCOUNTER — Inpatient Hospital Stay (HOSPITAL_COMMUNITY)
Admission: RE | Admit: 2021-01-19 | Discharge: 2021-01-20 | DRG: 266 | Disposition: A | Discharge status: Home / Self Care | Payer: Managed Care, Other (non HMO) | Attending: Cardiovascular Disease | Admitting: Cardiovascular Disease

## 2021-01-19 DIAGNOSIS — Z833 Family history of diabetes mellitus: Secondary | ICD-10-CM

## 2021-01-19 DIAGNOSIS — I350 Nonrheumatic aortic (valve) stenosis: Secondary | ICD-10-CM | POA: Diagnosis not present

## 2021-01-19 DIAGNOSIS — G4733 Obstructive sleep apnea (adult) (pediatric): Secondary | ICD-10-CM | POA: Diagnosis present

## 2021-01-19 DIAGNOSIS — N183 Chronic kidney disease, stage 3 unspecified: Secondary | ICD-10-CM | POA: Diagnosis present

## 2021-01-19 DIAGNOSIS — E782 Mixed hyperlipidemia: Secondary | ICD-10-CM | POA: Diagnosis present

## 2021-01-19 DIAGNOSIS — Z8249 Family history of ischemic heart disease and other diseases of the circulatory system: Secondary | ICD-10-CM

## 2021-01-19 DIAGNOSIS — I2510 Atherosclerotic heart disease of native coronary artery without angina pectoris: Secondary | ICD-10-CM | POA: Diagnosis present

## 2021-01-19 DIAGNOSIS — Z952 Presence of prosthetic heart valve: Secondary | ICD-10-CM

## 2021-01-19 DIAGNOSIS — I4819 Other persistent atrial fibrillation: Secondary | ICD-10-CM | POA: Diagnosis present

## 2021-01-19 DIAGNOSIS — I480 Paroxysmal atrial fibrillation: Secondary | ICD-10-CM | POA: Diagnosis present

## 2021-01-19 DIAGNOSIS — Z955 Presence of coronary angioplasty implant and graft: Secondary | ICD-10-CM

## 2021-01-19 DIAGNOSIS — Z841 Family history of disorders of kidney and ureter: Secondary | ICD-10-CM

## 2021-01-19 DIAGNOSIS — Z006 Encounter for examination for normal comparison and control in clinical research program: Secondary | ICD-10-CM

## 2021-01-19 DIAGNOSIS — I77810 Thoracic aortic ectasia: Secondary | ICD-10-CM | POA: Diagnosis present

## 2021-01-19 DIAGNOSIS — N186 End stage renal disease: Secondary | ICD-10-CM | POA: Diagnosis present

## 2021-01-19 DIAGNOSIS — I120 Hypertensive chronic kidney disease with stage 5 chronic kidney disease or end stage renal disease: Secondary | ICD-10-CM | POA: Diagnosis present

## 2021-01-19 DIAGNOSIS — I959 Hypotension, unspecified: Secondary | ICD-10-CM | POA: Diagnosis not present

## 2021-01-19 DIAGNOSIS — H409 Unspecified glaucoma: Secondary | ICD-10-CM | POA: Diagnosis present

## 2021-01-19 DIAGNOSIS — T41205A Adverse effect of unspecified general anesthetics, initial encounter: Secondary | ICD-10-CM | POA: Diagnosis not present

## 2021-01-19 DIAGNOSIS — Q231 Congenital insufficiency of aortic valve: Secondary | ICD-10-CM

## 2021-01-19 DIAGNOSIS — I330 Acute and subacute infective endocarditis: Secondary | ICD-10-CM | POA: Diagnosis present

## 2021-01-19 DIAGNOSIS — Z7901 Long term (current) use of anticoagulants: Secondary | ICD-10-CM | POA: Diagnosis not present

## 2021-01-19 DIAGNOSIS — I10 Essential (primary) hypertension: Secondary | ICD-10-CM | POA: Diagnosis present

## 2021-01-19 DIAGNOSIS — Z992 Dependence on renal dialysis: Secondary | ICD-10-CM | POA: Diagnosis not present

## 2021-01-19 HISTORY — PX: TRANSCATHETER AORTIC VALVE REPLACEMENT, TRANSFEMORAL: SHX6400

## 2021-01-19 HISTORY — PX: INTRAOPERATIVE TRANSTHORACIC ECHOCARDIOGRAM: SHX6523

## 2021-01-19 HISTORY — DX: Presence of prosthetic heart valve: Z95.2

## 2021-01-19 LAB — POCT I-STAT, CHEM 8
BUN: 67 mg/dL — ABNORMAL HIGH (ref 6–20)
BUN: 68 mg/dL — ABNORMAL HIGH (ref 6–20)
BUN: 63 mg/dL — ABNORMAL HIGH (ref 6–20)
Calcium, Ion: 1.11 mmol/L — ABNORMAL LOW (ref 1.15–1.40)
Calcium, Ion: 1.14 mmol/L — ABNORMAL LOW (ref 1.15–1.40)
Calcium, Ion: 1.16 mmol/L (ref 1.15–1.40)
Chloride: 104 mmol/L (ref 98–111)
Chloride: 104 mmol/L (ref 98–111)
Chloride: 106 mmol/L (ref 98–111)
Creatinine, Ser: 15.1 mg/dL — ABNORMAL HIGH (ref 0.61–1.24)
Creatinine, Ser: 13.3 mg/dL — ABNORMAL HIGH (ref 0.61–1.24)
Creatinine, Ser: 13.8 mg/dL — ABNORMAL HIGH (ref 0.61–1.24)
Glucose, Bld: 92 mg/dL (ref 70–99)
Glucose, Bld: 119 mg/dL — ABNORMAL HIGH (ref 70–99)
Glucose, Bld: 154 mg/dL — ABNORMAL HIGH (ref 70–99)
HCT: 31 % — ABNORMAL LOW (ref 39.0–52.0)
HCT: 30 % — ABNORMAL LOW (ref 39.0–52.0)
HCT: 31 % — ABNORMAL LOW (ref 39.0–52.0)
Hemoglobin: 10.5 g/dL — ABNORMAL LOW (ref 13.0–17.0)
Hemoglobin: 10.2 g/dL — ABNORMAL LOW (ref 13.0–17.0)
Hemoglobin: 10.5 g/dL — ABNORMAL LOW (ref 13.0–17.0)
Potassium: 3.7 mmol/L (ref 3.5–5.1)
Potassium: 3.9 mmol/L (ref 3.5–5.1)
Potassium: 3.8 mmol/L (ref 3.5–5.1)
Sodium: 141 mmol/L (ref 135–145)
Sodium: 139 mmol/L (ref 135–145)
Sodium: 141 mmol/L (ref 135–145)
TCO2: 22 mmol/L (ref 22–32)
TCO2: 23 mmol/L (ref 22–32)
TCO2: 20 mmol/L — ABNORMAL LOW (ref 22–32)

## 2021-01-19 LAB — ABO/RH: ABO/RH(D): O POS

## 2021-01-19 LAB — ECHOCARDIOGRAM LIMITED
AR max vel: 1.64 cm2
AV Area VTI: 1.28 cm2
AV Area mean vel: 1.54 cm2
AV Mean grad: 25.5 mmHg
AV Peak grad: 35.6 mmHg
Ao pk vel: 2.99 m/s

## 2021-01-19 LAB — POCT I-STAT 7, (LYTES, BLD GAS, ICA,H+H)
Acid-base deficit: 3 mmol/L — ABNORMAL HIGH (ref 0.0–2.0)
Bicarbonate: 21.6 mmol/L (ref 20.0–28.0)
Calcium, Ion: 1.14 mmol/L — ABNORMAL LOW (ref 1.15–1.40)
HCT: 29 % — ABNORMAL LOW (ref 39.0–52.0)
Hemoglobin: 9.9 g/dL — ABNORMAL LOW (ref 13.0–17.0)
O2 Saturation: 99 %
Potassium: 3.9 mmol/L (ref 3.5–5.1)
Sodium: 140 mmol/L (ref 135–145)
TCO2: 23 mmol/L (ref 22–32)
pCO2 arterial: 34.6 mmHg (ref 32.0–48.0)
pH, Arterial: 7.403 (ref 7.350–7.450)
pO2, Arterial: 137 mmHg — ABNORMAL HIGH (ref 83.0–108.0)

## 2021-01-19 SURGERY — IMPLANTATION, AORTIC VALVE, TRANSCATHETER, FEMORAL APPROACH
Anesthesia: Monitor Anesthesia Care | Site: Chest

## 2021-01-19 MED ORDER — DEXMEDETOMIDINE (PRECEDEX) IN NS 20 MCG/5ML (4 MCG/ML) IV SYRINGE
PREFILLED_SYRINGE | INTRAVENOUS | Status: AC
  Filled 2021-01-19: qty 5

## 2021-01-19 MED ORDER — MIDAZOLAM HCL 2 MG/2ML IJ SOLN
INTRAMUSCULAR | Status: AC
  Filled 2021-01-19: qty 2

## 2021-01-19 MED ORDER — TRAMADOL HCL 50 MG PO TABS: 50.0000 mg | ORAL_TABLET | ORAL | Status: DC | PRN

## 2021-01-19 MED ORDER — PROTAMINE SULFATE 10 MG/ML IV SOLN
INTRAVENOUS | Status: DC | PRN
  Administered 2021-01-19: 150 mg via INTRAVENOUS

## 2021-01-19 MED ORDER — SODIUM CHLORIDE 0.9 % IV SOLN: Freq: Once | INTRAVENOUS | Status: AC

## 2021-01-19 MED ORDER — ONDANSETRON HCL 4 MG/2ML IJ SOLN
INTRAMUSCULAR | Status: DC | PRN
  Administered 2021-01-19: 4 mg via INTRAVENOUS

## 2021-01-19 MED ORDER — MORPHINE SULFATE (PF) 2 MG/ML IV SOLN: 1.0000 mg | INTRAVENOUS | Status: DC | PRN

## 2021-01-19 MED ORDER — SODIUM CHLORIDE 0.9 % IV SOLN: 250.0000 mL | INTRAVENOUS | Status: DC

## 2021-01-19 MED ORDER — ASPIRIN 81 MG PO CHEW
81.0000 mg | CHEWABLE_TABLET | Freq: Every day | ORAL | Status: DC
  Administered 2021-01-20: 81 mg via ORAL
  Filled 2021-01-19: qty 1

## 2021-01-19 MED ORDER — SODIUM CHLORIDE 0.9% FLUSH
3.0000 mL | Freq: Two times a day (BID) | INTRAVENOUS | Status: DC
  Administered 2021-01-20: 3 mL via INTRAVENOUS

## 2021-01-19 MED ORDER — PROPOFOL 500 MG/50ML IV EMUL
INTRAVENOUS | Status: DC | PRN
  Administered 2021-01-19: 25 ug/kg/min via INTRAVENOUS

## 2021-01-19 MED ORDER — SODIUM CHLORIDE 0.9% FLUSH: 3.0000 mL | INTRAVENOUS | Status: DC | PRN

## 2021-01-19 MED ORDER — CHLORHEXIDINE GLUCONATE 4 % EX LIQD: 30.0000 mL | CUTANEOUS | Status: DC

## 2021-01-19 MED ORDER — CHLORHEXIDINE GLUCONATE 0.12 % MT SOLN
15.0000 mL | Freq: Once | OROMUCOSAL | Status: AC
  Administered 2021-01-19: 15 mL via OROMUCOSAL
  Filled 2021-01-19: qty 15

## 2021-01-19 MED ORDER — CEFAZOLIN SODIUM-DEXTROSE 1-4 GM/50ML-% IV SOLN
1.0000 g | INTRAVENOUS | Status: AC
  Administered 2021-01-20: 1 g via INTRAVENOUS
  Filled 2021-01-19: qty 50

## 2021-01-19 MED ORDER — HEPARIN SODIUM (PORCINE) 1000 UNIT/ML IJ SOLN
INTRAMUSCULAR | Status: AC
  Filled 2021-01-19: qty 2

## 2021-01-19 MED ORDER — APIXABAN 2.5 MG PO TABS
2.5000 mg | ORAL_TABLET | Freq: Two times a day (BID) | ORAL | Status: DC
  Administered 2021-01-20: 2.5 mg via ORAL
  Filled 2021-01-19: qty 1

## 2021-01-19 MED ORDER — PHENYLEPHRINE 40 MCG/ML (10ML) SYRINGE FOR IV PUSH (FOR BLOOD PRESSURE SUPPORT)
PREFILLED_SYRINGE | INTRAVENOUS | Status: DC | PRN
  Administered 2021-01-19 (×2): 80 ug via INTRAVENOUS

## 2021-01-19 MED ORDER — FERRIC CITRATE 1 GM 210 MG(FE) PO TABS
420.0000 mg | ORAL_TABLET | Freq: Three times a day (TID) | ORAL | Status: DC
  Administered 2021-01-19 – 2021-01-20 (×2): 420 mg via ORAL
  Filled 2021-01-19 (×2): qty 2

## 2021-01-19 MED ORDER — HEPARIN 6000 UNIT IRRIGATION SOLUTION
Status: AC
  Filled 2021-01-19: qty 1500

## 2021-01-19 MED ORDER — CEFAZOLIN SODIUM-DEXTROSE 2-4 GM/100ML-% IV SOLN: 2.0000 g | INTRAVENOUS | Status: DC

## 2021-01-19 MED ORDER — LIDOCAINE HCL 1 % IJ SOLN
INTRAMUSCULAR | Status: AC
  Filled 2021-01-19: qty 20

## 2021-01-19 MED ORDER — HEPARIN SODIUM (PORCINE) 1000 UNIT/ML IJ SOLN
INTRAMUSCULAR | Status: DC | PRN
Start: 2021-01-19 — End: 2021-01-19
  Administered 2021-01-19: 15000 [IU] via INTRAVENOUS

## 2021-01-19 MED ORDER — ONDANSETRON HCL 4 MG/2ML IJ SOLN
INTRAMUSCULAR | Status: AC
  Filled 2021-01-19: qty 2

## 2021-01-19 MED ORDER — ACETAMINOPHEN 325 MG PO TABS: 650.0000 mg | ORAL_TABLET | Freq: Four times a day (QID) | ORAL | Status: DC | PRN

## 2021-01-19 MED ORDER — HEPARIN 6000 UNIT IRRIGATION SOLUTION
Status: DC | PRN
  Administered 2021-01-19 (×3): 1

## 2021-01-19 MED ORDER — CHLORHEXIDINE GLUCONATE 4 % EX LIQD: 60.0000 mL | Freq: Once | CUTANEOUS | Status: DC

## 2021-01-19 MED ORDER — PROPOFOL 10 MG/ML IV BOLUS
INTRAVENOUS | Status: AC
  Filled 2021-01-19: qty 20

## 2021-01-19 MED ORDER — DOCUSATE SODIUM 100 MG PO CAPS
100.0000 mg | ORAL_CAPSULE | Freq: Every day | ORAL | Status: DC
  Administered 2021-01-19 – 2021-01-20 (×2): 100 mg via ORAL
  Filled 2021-01-19 (×2): qty 1

## 2021-01-19 MED ORDER — PHENYLEPHRINE HCL-NACL 20-0.9 MG/250ML-% IV SOLN
0.0000 ug/min | INTRAVENOUS | Status: DC
Start: 2021-01-19 — End: 2021-01-20
  Filled 2021-01-19: qty 250

## 2021-01-19 MED ORDER — 0.9 % SODIUM CHLORIDE (POUR BTL) OPTIME
TOPICAL | Status: DC | PRN
  Administered 2021-01-19: 1000 mL

## 2021-01-19 MED ORDER — SODIUM CHLORIDE 0.9 % IV SOLN: 250.0000 mL | INTRAVENOUS | Status: DC | PRN

## 2021-01-19 MED ORDER — ONDANSETRON HCL 4 MG/2ML IJ SOLN: 4.0000 mg | Freq: Four times a day (QID) | INTRAMUSCULAR | Status: DC | PRN

## 2021-01-19 MED ORDER — LIDOCAINE HCL 1 % IJ SOLN
INTRAMUSCULAR | Status: DC | PRN
  Administered 2021-01-19: 15 mL

## 2021-01-19 MED ORDER — IODIXANOL 320 MG/ML IV SOLN
INTRAVENOUS | Status: DC | PRN
  Administered 2021-01-19: 80 mL

## 2021-01-19 MED ORDER — FENTANYL CITRATE (PF) 250 MCG/5ML IJ SOLN
INTRAMUSCULAR | Status: DC | PRN
  Administered 2021-01-19 (×2): 50 ug via INTRAVENOUS

## 2021-01-19 MED ORDER — NITROGLYCERIN IN D5W 200-5 MCG/ML-% IV SOLN: 0.0000 ug/min | INTRAVENOUS | Status: DC

## 2021-01-19 MED ORDER — ACETAMINOPHEN 650 MG RE SUPP: 650.0000 mg | Freq: Four times a day (QID) | RECTAL | Status: DC | PRN

## 2021-01-19 MED ORDER — FENTANYL CITRATE (PF) 250 MCG/5ML IJ SOLN
INTRAMUSCULAR | Status: AC
  Filled 2021-01-19: qty 5

## 2021-01-19 MED ORDER — VASOPRESSIN 20 UNIT/ML IV SOLN
INTRAVENOUS | Status: DC | PRN
  Administered 2021-01-19: 1 [IU] via INTRAVENOUS

## 2021-01-19 MED ORDER — SODIUM CHLORIDE 0.9 % IV SOLN: INTRAVENOUS | Status: AC

## 2021-01-19 MED ORDER — ATORVASTATIN CALCIUM 40 MG PO TABS
40.0000 mg | ORAL_TABLET | Freq: Every day | ORAL | Status: DC
  Administered 2021-01-19 – 2021-01-20 (×2): 40 mg via ORAL
  Filled 2021-01-19 (×2): qty 1

## 2021-01-19 MED ORDER — ALLOPURINOL 100 MG PO TABS
100.0000 mg | ORAL_TABLET | Freq: Every day | ORAL | Status: DC
  Administered 2021-01-19 – 2021-01-20 (×2): 100 mg via ORAL
  Filled 2021-01-19 (×2): qty 1

## 2021-01-19 MED ORDER — VASOPRESSIN 20 UNIT/ML IV SOLN
INTRAVENOUS | Status: AC
  Filled 2021-01-19: qty 1

## 2021-01-19 MED ORDER — MIDAZOLAM HCL 2 MG/2ML IJ SOLN
INTRAMUSCULAR | Status: DC | PRN
  Administered 2021-01-19 (×2): 1 mg via INTRAVENOUS

## 2021-01-19 MED ORDER — SODIUM CHLORIDE 0.9 % IV SOLN: INTRAVENOUS | Status: DC

## 2021-01-19 MED ORDER — OXYCODONE HCL 5 MG PO TABS: 5.0000 mg | ORAL_TABLET | ORAL | Status: DC | PRN

## 2021-01-19 MED ORDER — PROTAMINE SULFATE 10 MG/ML IV SOLN
INTRAVENOUS | Status: AC
  Filled 2021-01-19: qty 25

## 2021-01-19 MED ORDER — CALCIUM CHLORIDE 10 % IV SOLN
INTRAVENOUS | Status: DC | PRN
  Administered 2021-01-19: 300 mg via INTRAVENOUS

## 2021-01-19 SURGICAL SUPPLY — 58 items
BAG COUNTER SPONGE SURGICOUNT (BAG) ×2 IMPLANT
BAG DECANTER FOR FLEXI CONT (MISCELLANEOUS) IMPLANT
BLADE CLIPPER SURG (BLADE) IMPLANT
BLADE OSCILLATING /SAGITTAL (BLADE) IMPLANT
BLADE STERNUM SYSTEM 6 (BLADE) IMPLANT
CABLE ADAPT CONN TEMP 6FT (ADAPTER) ×2 IMPLANT
CATH BEACON 5 .035 40 KMP TP (CATHETERS) ×1 IMPLANT
CATH BEACON 5 .038 40 KMP TP (CATHETERS) ×1
CATH DIAG EXPO 6F AL1 (CATHETERS) IMPLANT
CATH DIAG EXPO 6F VENT PIG 145 (CATHETERS) ×4 IMPLANT
CATH INFINITI 6F AL2 (CATHETERS) IMPLANT
CATH S G BIP PACING (CATHETERS) ×2 IMPLANT
CHLORAPREP W/TINT 26 (MISCELLANEOUS) ×2 IMPLANT
CLOSURE MYNX CONTROL 6F/7F (Vascular Products) ×2 IMPLANT
CNTNR URN SCR LID CUP LEK RST (MISCELLANEOUS) ×2 IMPLANT
CONT SPEC 4OZ STRL OR WHT (MISCELLANEOUS) ×2
COVER BACK TABLE 80X110 HD (DRAPES) ×2 IMPLANT
DECANTER SPIKE VIAL GLASS SM (MISCELLANEOUS) ×2 IMPLANT
DERMABOND ADVANCED (GAUZE/BANDAGES/DRESSINGS) ×1
DERMABOND ADVANCED .7 DNX12 (GAUZE/BANDAGES/DRESSINGS) ×1 IMPLANT
DEVICE CLOSURE PERCLS PRGLD 6F (VASCULAR PRODUCTS) ×2 IMPLANT
DRSG TEGADERM 4X4.75 (GAUZE/BANDAGES/DRESSINGS) ×2 IMPLANT
ELECT REM PT RETURN 9FT ADLT (ELECTROSURGICAL) ×2
ELECTRODE REM PT RTRN 9FT ADLT (ELECTROSURGICAL) ×1 IMPLANT
GAUZE SPONGE 4X4 12PLY STRL (GAUZE/BANDAGES/DRESSINGS) ×2 IMPLANT
GLOVE SURG ENC MOIS LTX SZ7.5 (GLOVE) ×2 IMPLANT
GLOVE SURG ENC MOIS LTX SZ8 (GLOVE) IMPLANT
GLOVE SURG ORTHO LTX SZ7.5 (GLOVE) IMPLANT
GOWN STRL REUS W/ TWL LRG LVL3 (GOWN DISPOSABLE) IMPLANT
GOWN STRL REUS W/ TWL XL LVL3 (GOWN DISPOSABLE) ×1 IMPLANT
GOWN STRL REUS W/TWL LRG LVL3 (GOWN DISPOSABLE)
GOWN STRL REUS W/TWL XL LVL3 (GOWN DISPOSABLE) ×1
GUIDEWIRE SAFE TJ AMPLATZ EXST (WIRE) ×2 IMPLANT
KIT BASIN OR (CUSTOM PROCEDURE TRAY) ×2 IMPLANT
KIT HEART LEFT (KITS) ×2 IMPLANT
KIT SAPIAN 3 ULTRA RESILIA 26 (Valve) ×2 IMPLANT
KIT TURNOVER KIT B (KITS) ×2 IMPLANT
NS IRRIG 1000ML POUR BTL (IV SOLUTION) ×2 IMPLANT
PACK ENDO MINOR (CUSTOM PROCEDURE TRAY) ×2 IMPLANT
PAD ARMBOARD 7.5X6 YLW CONV (MISCELLANEOUS) ×4 IMPLANT
PAD ELECT DEFIB RADIOL ZOLL (MISCELLANEOUS) ×2 IMPLANT
PERCLOSE PROGLIDE 6F (VASCULAR PRODUCTS) ×4
POSITIONER HEAD DONUT 9IN (MISCELLANEOUS) ×2 IMPLANT
SET MICROPUNCTURE 5F STIFF (MISCELLANEOUS) ×2 IMPLANT
SHEATH BRITE TIP 7FR 35CM (SHEATH) ×2 IMPLANT
SHEATH PINNACLE 6F 10CM (SHEATH) ×2 IMPLANT
SHEATH PINNACLE 8F 10CM (SHEATH) ×2 IMPLANT
SLEEVE REPOSITIONING LENGTH 30 (MISCELLANEOUS) ×2 IMPLANT
STOPCOCK MORSE 400PSI 3WAY (MISCELLANEOUS) ×4 IMPLANT
SUT PROLENE 6 0 C 1 30 (SUTURE) IMPLANT
SUT SILK  1 MH (SUTURE) ×1
SUT SILK 1 MH (SUTURE) ×1 IMPLANT
SYR 50ML LL SCALE MARK (SYRINGE) ×2 IMPLANT
SYR BULB IRRIG 60ML STRL (SYRINGE) IMPLANT
TOWEL GREEN STERILE (TOWEL DISPOSABLE) ×4 IMPLANT
TRANSDUCER W/STOPCOCK (MISCELLANEOUS) ×4 IMPLANT
WIRE EMERALD 3MM-J .035X150CM (WIRE) ×2 IMPLANT
WIRE EMERALD 3MM-J .035X260CM (WIRE) ×2 IMPLANT

## 2021-01-19 NOTE — Progress Notes (Signed)
Pt admitted to rm 13 from cath lab. CHG wipe given. Initiated tele. Oriented pt to the unit. VSS. Call bell within reach.   Lavenia Atlas, RN

## 2021-01-19 NOTE — Progress Notes (Signed)
I was called to see [name] s/p TAVR.  He is a peritoneal dialysis patient followed by Dr. Joelyn Oms at [facility].  He has been on PD since January.  He says that he makes urine and for the most part uses 1.5% bags so does not get into trouble with volume.  He now is s/p TAVR-  some complication of hypotension but improving-  he is awake and alert.  His labs do not indicate an immediate need for RRT.   I offered to perform PD tonight with the anticipation that he will be discharged tomorrow.  He had told the CT surgery team that on occasion he will "skip"  a night of dialysis.  He prefers to hold off on his PD tonight given what he has been through today but promises to resume PD tomorrow night.  If he is still here  I will arrange other wise he will resume his home regimen upon discharge tomorrow.  He is getting IVF at 50 per hour due to hypotension but is due to end after 8 hours.  He is not edematous or hypoxic at this time.   Please call if I can be of any assist tonight -  I will look at chart and do formal consult if does not get discharged tomorrow   Louis Meckel

## 2021-01-19 NOTE — Progress Notes (Signed)
PHARMACY NOTE:  ANTIMICROBIAL RENAL DOSAGE ADJUSTMENT  Current antimicrobial regimen includes a mismatch between antimicrobial dosage and estimated renal function.  As per policy approved by the Pharmacy & Therapeutics and Medical Executive Committees, the antimicrobial dosage will be adjusted accordingly.  Current antimicrobial dosage:  cefazolin 2g q8 hr x2 doses   Indication: surgical prophylaxis   Renal Function: On peritoneal dialysis    Antimicrobial dosage has been changed to:  1g q24 hr  x1 dose     Thank you for allowing pharmacy to be a part of this patient's care.  Benetta Spar, PharmD, BCPS, BCCP Clinical Pharmacist  Please check AMION for all [place] phone numbers After 10:00 PM, call [place] [phone]

## 2021-01-19 NOTE — Interval H&P Note (Signed)
History and Physical Interval Note:  01/19/2021 6:18 AM  Daniel Solomon  has presented today for surgery, with the diagnosis of Severe Aortic Stenosis.  The various methods of treatment have been discussed with the patient and family. After consideration of risks, benefits and other options for treatment, the patient has consented to  Procedure(s): TRANSCATHETER AORTIC VALVE REPLACEMENT, TRANSFEMORAL (N/A) INTRAOPERATIVE TRANSTHORACIC ECHOCARDIOGRAM (N/A) as a surgical intervention.  The patient's history has been reviewed, patient examined, no change in status, stable for surgery.  I have reviewed the patient's chart and labs.  Questions were answered to the patient's satisfaction.     Gaye Pollack

## 2021-01-19 NOTE — CV Procedure (Signed)
[facility]  TAVR OPERATIVE NOTE   Date of Procedure:  01/19/2021  Preoperative Diagnosis: Severe Aortic Stenosis   Postoperative Diagnosis: Same   Procedure:   Transcatheter Aortic Valve Replacement - Transfemoral Approach  Edwards Sapien 3 THV (size 26 mm, model # [id number], serial # [id number])   Co-Surgeons:  Lauree Chandler, MD and Gaye Pollack, MD   Anesthesiologist:  Smith Robert  Echocardiographer:  Johnsie Cancel  Pre-operative Echo Findings: Severe aortic stenosis Normal left ventricular systolic function  Post-operative Echo Findings: No paravalvular leak Normal left ventricular systolic function  BRIEF CLINICAL NOTE AND INDICATIONS FOR SURGERY  60 yo male with history of ESRD on PD, CAD, HTN, HLD, sinus bradycardia, bicuspid aortic valve with moderate AS, sleep apnea, thoracic aortic aneurysm, persistent atrial fibrillation here today for TAVR. He was admitted with unstable angina in September 2012 and was found to have severe stenosis in the mid to distal LAD treated with a 2.25 x 18 mm Resolute DES. He was also found to have moderate mid LAD and distal RCA disease. In December 2017 he was found to have a branch retinal artery occlusion in his right eye. Echo November 2017 with LVEF=55-60%, mild AS, mildly dilated aortic root. Carotid dopplers with mild disease. Event monitor showed atrial fibrillation. He was in atrial fibrillation at time of office visit here in January 2018. He was started on Xarelto January 2018. Echo May 2021 with LVEF=60-65%. Mild LVH. Likely bicuspid aortic valve with mild aortic valve stenosis. Most recent echo July 2022 with LVEF=60-65%. Mild LVH. The aortic valve appears to be bicuspid with fused non and right cusps. The aortic valve leaflets are thickened and calcified. Mean gradient 22 mmHg. Peak gradient 36.8 mmHg, AVA 1.2 cm2, dimensionless index 0.30. This is consistent with moderate aortic stenosis. Echo at [facility] June  2022 with moderate to severe AS, normal LV function. I cannot see these images but the report is available in Care Everywhere. Mean gradient of 31.6 mmHg, AVA 0.8 cm2, dimensionless index 0.30. He has been told that he cannot have a kidney transplant at [facility] unless he has TAVR first. Cardiac cath with moderate non-obstructive disease.   During the course of the patient's preoperative work up they have been evaluated comprehensively by a multidisciplinary team of specialists coordinated through the [facility] in the [facility] [facility].  They have been demonstrated to suffer from symptomatic severe aortic stenosis as noted above. The patient has been counseled extensively as to the relative risks and benefits of all options for the treatment of severe aortic stenosis including long term medical therapy, conventional surgery for aortic valve replacement, and transcatheter aortic valve replacement.  The patient has been independently evaluated by Dr. Cyndia Bent with CT surgery and they are felt to be at high risk for conventional surgical aortic valve replacement. The surgeon indicated the patient would be a poor candidate for conventional surgery. Based upon review of all of the patient's preoperative diagnostic tests they are felt to be candidate for transcatheter aortic valve replacement using the transfemoral approach as an alternative to high risk conventional surgery.    Following the decision to proceed with transcatheter aortic valve replacement, a discussion has been held regarding what types of management strategies would be attempted intraoperatively in the event of life-threatening complications, including whether or not the patient would be considered a candidate for the use of cardiopulmonary bypass and/or conversion to open sternotomy for attempted surgical intervention.  The  patient has been advised of a variety of complications that might develop peculiar  to this approach including but not limited to risks of death, stroke, paravalvular leak, aortic dissection or other major vascular complications, aortic annulus rupture, device embolization, cardiac rupture or perforation, acute myocardial infarction, arrhythmia, heart block or bradycardia requiring permanent pacemaker placement, congestive heart failure, respiratory failure, renal failure, pneumonia, infection, other late complications related to structural valve deterioration or migration, or other complications that might ultimately cause a temporary or permanent loss of functional independence or other long term morbidity.  The patient provides full informed consent for the procedure as described and all questions were answered preoperatively.    DETAILS OF THE OPERATIVE PROCEDURE  PREPARATION:   The patient is brought to the operating room on the above mentioned date and central monitoring was established by the anesthesia team including placement of a radial arterial line. The patient is placed in the supine position on the operating table.  Intravenous antibiotics are administered. Conscious sedation is used.   Baseline transthoracic echocardiogram was performed. The patient's chest, abdomen, both groins, and both lower extremities are prepared and draped in a sterile manner. A time out procedure is performed.   PERIPHERAL ACCESS:   Using the modified Seldinger technique, femoral arterial and venous access were obtained with placement of a 6 Fr sheath in the artery and a 7 Fr sheath in the vein on the left side using u/s guidance.  A pigtail diagnostic catheter was passed through the femoral arterial sheath under fluoroscopic guidance into the aortic root.  A temporary transvenous pacemaker catheter was passed through the femoral venous sheath under fluoroscopic guidance into the right ventricle.  The pacemaker was tested to ensure stable lead placement and pacemaker capture. Aortic root  angiography was performed in order to determine the optimal angiographic angle for valve deployment.  TRANSFEMORAL ACCESS:  A micropuncture kit was used to gain access to the right femoral artery using u/s guidance. Position confirmed with angiography. Pre-closure with double ProGlide closure devices. The patient was heparinized systemically and ACT verified > 250 seconds.    A 14 Fr transfemoral E-sheath was introduced into the right femoral artery after progressively dilating over an Amplatz superstiff wire. An AL-2 catheter was used to direct a straight-tip exchange length wire across the native aortic valve into the left ventricle. This was exchanged out for a pigtail catheter and position was confirmed in the LV apex. The pigtail catheter was then exchanged for an Amplatz Extra-stiff wire in the LV apex.   TRANSCATHETER HEART VALVE DEPLOYMENT:  An Edwards Sapien 3 THV (size 26 mm) was prepared and crimped per manufacturer's guidelines, and the proper orientation of the valve is confirmed on the [employer] delivery system. The valve was advanced through the introducer sheath using normal technique until in an appropriate position in the abdominal aorta beyond the sheath tip. The balloon was then retracted and using the fine-tuning wheel was centered on the valve. The valve was then advanced across the aortic arch using appropriate flexion of the catheter. The valve was carefully positioned across the aortic valve annulus. The Commander catheter was retracted using normal technique. Once final position of the valve has been confirmed by angiographic assessment, the valve is deployed while temporarily holding ventilation and during rapid ventricular pacing to maintain systolic blood pressure < 50 mmHg and pulse pressure < 10 mmHg. The balloon inflation is held for >3 seconds after reaching full deployment volume. Once the balloon has fully  deflated the balloon is retracted into the ascending aorta  and valve function is assessed using TTE. There is felt to be no paravalvular leak and no central aortic insufficiency.  The patient's hemodynamic recovery following valve deployment is good.  The deployment balloon and guidewire are both removed. Echo demostrated acceptable post-procedural gradients, stable mitral valve function, and no AI.   PROCEDURE COMPLETION:  The sheath was then removed and closure devices were completed. Protamine was administered once femoral arterial repair was complete. The temporary pacemaker, pigtail catheters and femoral sheaths were removed with a Mynx closure device placed in the artery and manual pressure used for venous hemostasis.    The patient tolerated the procedure well and is transported to the surgical intensive care in stable condition. There were no immediate intraoperative complications. All sponge instrument and needle counts are verified correct at completion of the operation.   No blood products were administered during the operation.  The patient received a total of 80 mL of intravenous contrast during the procedure.  Lauree Chandler MD 01/19/2021 9:52 AM

## 2021-01-19 NOTE — Anesthesia Postprocedure Evaluation (Signed)
Anesthesia Post Note  Patient: Daniel Solomon  Procedure(s) Performed: TRANSCATHETER AORTIC VALVE REPLACEMENT, TRANSFEMORAL USING A 26MM EDWARDS VALVE. (Chest) INTRAOPERATIVE TRANSTHORACIC ECHOCARDIOGRAM (Chest)     Patient location during evaluation: PACU Anesthesia Type: MAC Level of consciousness: awake and alert Pain management: pain level controlled Vital Signs Assessment: post-procedure vital signs reviewed and stable Respiratory status: spontaneous breathing, nonlabored ventilation, respiratory function stable and patient connected to nasal cannula oxygen Cardiovascular status: stable and blood pressure returned to baseline Postop Assessment: no apparent nausea or vomiting Anesthetic complications: no   No notable events documented.  Last Vitals:  Vitals:   01/19/21 1430 01/19/21 1500  BP: 101/71 97/70  Pulse: 85 82  Resp: (!) 21 14  Temp:    SpO2: 98% 96%    Last Pain:  Vitals:   01/19/21 1239  TempSrc: Oral  PainSc: 0-No pain                 Effie Berkshire

## 2021-01-19 NOTE — Progress Notes (Signed)
Spoke to [place] regarding pts low BP, instructed to give 2nd NS IV Bolus, Sharee Pimple to see pt in Cath Lab holding, safety maintained  Pt noted to have Peritoneal dialysis access, tubing clamped presently

## 2021-01-19 NOTE — Transfer of Care (Signed)
Immediate Anesthesia Transfer of Care Note  Patient: Daniel Solomon  Procedure(s) Performed: TRANSCATHETER AORTIC VALVE REPLACEMENT, TRANSFEMORAL USING A 26MM EDWARDS VALVE. (Chest) INTRAOPERATIVE TRANSTHORACIC ECHOCARDIOGRAM (Chest)  Patient Location: Cath Lab  Anesthesia Type:MAC  Level of Consciousness: drowsy and patient cooperative  Airway & Oxygen Therapy: Patient Spontanous Breathing and Patient connected to nasal cannula oxygen  Post-op Assessment: Report given to RN and Post -op Vital signs reviewed and stable  Post vital signs: Reviewed and stable  Last Vitals:  Vitals Value Taken Time  BP    Temp    Pulse 0 01/19/21 1002  Resp    SpO2      Last Pain:  Vitals:   01/19/21 0625  TempSrc:   PainSc: 0-No pain      Patients Stated Pain Goal: 0 (28/83/37 4451)  Complications: No notable events documented.

## 2021-01-19 NOTE — Anesthesia Procedure Notes (Signed)
Arterial Line Insertion Start/End11/22/2022 7:05 AM, 01/19/2021 7:15 AM Performed by: Moshe Salisbury, CRNA, CRNA  Patient location: Pre-op. Preanesthetic checklist: patient identified, IV checked, site marked, risks and benefits discussed, surgical consent, monitors and equipment checked, pre-op evaluation, timeout performed and anesthesia consent Lidocaine 1% used for infiltration and patient sedated Right, radial was placed Catheter size: 20 G Hand hygiene performed , maximum sterile barriers used  and Seldinger technique used  Attempts: 1 Procedure performed without using ultrasound guided technique. Following insertion, dressing applied and Biopatch. Post procedure assessment: normal  Patient tolerated the procedure well with no immediate complications.

## 2021-01-19 NOTE — Anesthesia Procedure Notes (Signed)
Procedure Name: MAC Date/Time: 01/19/2021 8:24 AM Performed by: Moshe Salisbury, CRNA Pre-anesthesia Checklist: Patient identified, Emergency Drugs available, Suction available and Patient being monitored Oxygen Delivery Method: Nasal cannula Ventilation: Oral airway inserted - appropriate to patient size Placement Confirmation: positive ETCO2 Dental Injury: Teeth and Oropharynx as per pre-operative assessment

## 2021-01-19 NOTE — Progress Notes (Signed)
  [place]   Nephrology contacted for possible PD dialysis while inpatient later today. They will come to bedside for full evaluation and recommendations. Appreciate their time.  Kathyrn Drown NP-C Structural Heart Team  Pager: [phone]

## 2021-01-19 NOTE — Assessment & Plan Note (Deleted)
Edwards Sapien 3 Ultra RSLTHV (size 26 mm) vis TF approach with Dr. Angelena Form and Dr. Cyndia Bent.

## 2021-01-19 NOTE — Progress Notes (Signed)
  [place] VALVE TEAM  Patient doing well s/p TAVR. Initially in the post procedure setting the was hypotensive with arterial systolic pressures in the 70-80's. He was given 22ml bolus per anesthesia on arrival to the holding area. Another 220ml was given on my assessment. BP cuff and arterial pressures remained labile despite fluid bolus. The patient was rather sedated after anesthesia, which was felt to be the likely culprit of his hypotension. There was discussion about starting short term pressors however BP improved as anesthesia wore off.  He was asymptomatic. Groin sites stable with no s/s of hematoma or bleeding. ECG with no high grade block. Arterial line was left in place for better monitoring on 4E. Once less sedated, plan to pull art loine and early ambulation after bedrest completed. Hopeful discharge over the next 24-48 hours.    Kathyrn Drown NP-C Structural Heart Team  Pager: [phone]

## 2021-01-19 NOTE — Progress Notes (Signed)
Mobility Specialist: Progress Note   01/19/21 1746  Mobility  Activity Ambulated in hall  Level of Assistance Independent  Assistive Device None  Distance Ambulated (ft) 360 ft  Mobility Ambulated independently in hallway  Mobility Response Tolerated well  Mobility performed by Mobility specialist  $Mobility charge 1 Mobility   Pre-Mobility: 97% SpO2 Post-Mobility: 89 HR, 92/59 BP, 94% SpO2  Pt c/o feeling light headed during ambulation, otherwise no c/o. Pt back to bed after walk with call bell in reach and family member present in the room.   [facility] Jasneet Schobert Mobility Specialist Mobility Specialist Phone #1: [phone] Mobility Specialist Phone #2: [phone]

## 2021-01-19 NOTE — Op Note (Signed)
[facility]   MULTIDISCIPLINARY HEART VALVE TEAM   TAVR OPERATIVE NOTE   Date of Procedure:  01/19/2021  Preoperative Diagnosis: Severe Aortic Stenosis   Postoperative Diagnosis: Same   Procedure:   Transcatheter Aortic Valve Replacement - Percutaneous Right Transfemoral Approach  Edwards Sapien 3 Ultra RSLTHV (size 26 mm, model # 9755RSL, serial # [id number])   Co-Surgeons:  Gaye Pollack, MD and Lauree Chandler, MD   Anesthesiologist:  Wilfrid Lund, MD  Echocardiographer:  P. Johnsie Cancel, MD  Pre-operative Echo Findings: Severe aortic stenosis Normal left ventricular systolic function  Post-operative Echo Findings: No paravalvular leak Normal left ventricular systolic function   BRIEF CLINICAL NOTE AND INDICATIONS FOR SURGERY  This 60 year old gentleman has stage D, symptomatic, moderate to severe bicuspid aortic stenosis with New York Heart Association class II symptoms of exertional fatigue and shortness of breath consistent with chronic diastolic congestive heart failure.  I agree that aortic valve replacement is indicated prior to kidney transplantation to decrease his risk of perioperative complications and progressive left ventricular deterioration.  I have personally reviewed his 2D echocardiogram, cardiac catheterization, and CTA studies.  His echocardiogram shows a calcified and thickened bicuspid aortic valve with a mean gradient of 22 mmHg and a peak gradient of 37 mmHg with significant worsening over the past year.  There is restricted leaflet mobility and I think his valve looks like a severely stenotic valve.  The mean gradient across aortic valve was measured at 32 mmHg by echocardiogram at [facility] in June 2022.  Cardiac catheterization shows moderate diffuse LAD disease with a patent mid to distal LAD stent.  There is mild left circumflex disease.  The RCA is a large dominant vessel with moderately severe distal stenosis at the bifurcation  of the PDA and PL.  I think his coronary disease could be managed medically at this time.  His risk for open surgical aortic valve replacement and CABG would be increased due to his comorbid risk factors and I think TAVR would be a reasonable alternative for treating his aortic stenosis prior to kidney transplantation.  His gated cardiac CTA shows anatomy suitable for TAVR using a SAPIEN 3 valve.  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  His abdominal and pelvic CTA shows adequate pelvic vascular anatomy to allow transfemoral insertion.   The patient was counseled at length regarding treatment alternatives for management of severe symptomatic aortic stenosis. The risks and benefits of surgical intervention has been discussed in detail. Long-term prognosis with medical therapy was discussed. Alternative approaches such as conventional surgical aortic valve replacement, transcatheter aortic valve replacement, and palliative medical therapy were compared and contrasted at length. This discussion was placed in the context of the patient's own specific clinical presentation and past medical history. All of his questions have been addressed.    Following the decision to proceed with transcatheter aortic valve replacement, a discussion was held regarding what types of management strategies would be attempted intraoperatively in the event of life-threatening complications, including whether or not the patient would be considered a candidate for the use of cardiopulmonary bypass and/or conversion to open sternotomy for attempted surgical intervention.  I think he would be a candidate for emergent sternotomy to manage any intraoperative complications.  The patient is aware of the fact that transient use of cardiopulmonary bypass may be necessary. The patient has been advised of a variety of complications that might develop including but not limited to risks of death, stroke, paravalvular  leak, aortic dissection  or other major vascular complications, aortic annulus rupture, device embolization, cardiac rupture or perforation, mitral regurgitation, acute myocardial infarction, arrhythmia, heart block or bradycardia requiring permanent pacemaker placement, congestive heart failure, respiratory failure, renal failure, pneumonia, infection, other late complications related to structural valve deterioration or migration, or other complications that might ultimately cause a temporary or permanent loss of functional independence or other long term morbidity. The patient provides full informed consent for the procedure as described and all questions were answered.     DETAILS OF THE OPERATIVE PROCEDURE  PREPARATION:    The patient was brought to the operating room on the above mentioned date and appropriate monitoring was established by the anesthesia team. The patient was placed in the supine position on the operating table.  Intravenous antibiotics were administered. The patient was monitored closely throughout the procedure under conscious sedation.    Baseline transthoracic echocardiogram was performed. The patient's abdomen and both groins were prepped and draped in a sterile manner. A time out procedure was performed.   PERIPHERAL ACCESS:    Using the modified Seldinger technique, femoral arterial and venous access was obtained with placement of 6 Fr sheaths on the left side.  A pigtail diagnostic catheter was passed through the left arterial sheath under fluoroscopic guidance into the aortic root.  A temporary transvenous pacemaker catheter was passed through the left femoral venous sheath under fluoroscopic guidance into the right ventricle.  The pacemaker was tested to ensure stable lead placement and pacemaker capture. Aortic root angiography was performed in order to determine the optimal angiographic angle for valve deployment.   TRANSFEMORAL ACCESS:   Percutaneous transfemoral access and sheath  placement was performed using ultrasound guidance.  The right common femoral artery was cannulated using a micropuncture needle and appropriate location was verified using hand injection angiogram.  A pair of Abbott Perclose percutaneous closure devices were placed and a 6 French sheath replaced into the femoral artery.  The patient was heparinized systemically and ACT verified > 250 seconds.    A 14 Fr transfemoral E-sheath was introduced into the right common femoral artery after progressively dilating over an Amplatz superstiff wire. An AL-2 catheter was used to direct a straight-tip exchange length wire across the native aortic valve into the left ventricle. This was exchanged out for a pigtail catheter and position was confirmed in the LV apex. Simultaneous LV and Ao pressures were recorded.  The pigtail catheter was exchanged for an Amplatz Extra-stiff wire in the LV apex.    BALLOON AORTIC VALVULOPLASTY:   Not performed   TRANSCATHETER HEART VALVE DEPLOYMENT:   An Edwards Sapien 3 Ultra transcatheter heart valve (size 26 mm) was prepared and crimped per manufacturer's guidelines, and the proper orientation of the valve is confirmed on the [employer] delivery system. The valve was advanced through the introducer sheath using normal technique until in an appropriate position in the abdominal aorta beyond the sheath tip. The balloon was then retracted and using the fine-tuning wheel was centered on the valve. The valve was then advanced across the aortic arch using appropriate flexion of the catheter. The valve was carefully positioned across the aortic valve annulus. The Commander catheter was retracted using normal technique. Once final position of the valve has been confirmed by angiographic assessment, the valve is deployed while temporarily holding ventilation and during rapid ventricular pacing to maintain systolic blood pressure < 50 mmHg and pulse pressure < 10 mmHg. The balloon  inflation is held  for >3 seconds after reaching full deployment volume. Once the balloon has fully deflated the balloon is retracted into the ascending aorta and valve function is assessed using echocardiography. There is felt to be no paravalvular leak and no central aortic insufficiency.  The patient's hemodynamic recovery following valve deployment is good.  The deployment balloon and guidewire are both removed.    PROCEDURE COMPLETION:   The sheath was removed and femoral artery closure performed.  Protamine was administered once femoral arterial repair was complete. The temporary pacemaker, pigtail catheters and femoral sheaths were removed with manual pressure used for hemostasis.  A Mynx femoral closure device was utilized following removal of the diagnostic sheath in the left femoral artery.  The patient tolerated the procedure well and is transported to the cath lab recovery area in stable condition. There were no immediate intraoperative complications. All sponge instrument and needle counts are verified correct at completion of the operation.   No blood products were administered during the operation.  The patient received a total of 80 mL of intravenous contrast during the procedure.   Gaye Pollack, MD 01/19/2021

## 2021-01-19 NOTE — Progress Notes (Signed)
VAST consult received to place [facility] for vasopressor administration. Spoke with patient's nurse who stated patient has appropriate access for medications at this time.

## 2021-01-19 NOTE — Progress Notes (Addendum)
Pt with c/o mid to left lower abdominal pain, 2-3/10, stated as low grade, bilateral groins remain soft and drsgs in place, CDI, Sharee Pimple states she will see patient on [address], safety maintained, no orders obtained at this time

## 2021-01-20 ENCOUNTER — Encounter (HOSPITAL_COMMUNITY): Payer: Self-pay | Admitting: Cardiovascular Disease

## 2021-01-20 ENCOUNTER — Inpatient Hospital Stay (HOSPITAL_COMMUNITY): Payer: Managed Care, Other (non HMO)

## 2021-01-20 DIAGNOSIS — I350 Nonrheumatic aortic (valve) stenosis: Secondary | ICD-10-CM | POA: Diagnosis not present

## 2021-01-20 DIAGNOSIS — Z952 Presence of prosthetic heart valve: Secondary | ICD-10-CM

## 2021-01-20 LAB — CBC
HCT: 28.1 % — ABNORMAL LOW (ref 39.0–52.0)
Hemoglobin: 9.5 g/dL — ABNORMAL LOW (ref 13.0–17.0)
MCH: 35.2 pg — ABNORMAL HIGH (ref 26.0–34.0)
MCHC: 33.8 g/dL (ref 30.0–36.0)
MCV: 104.1 fL — ABNORMAL HIGH (ref 80.0–100.0)
Platelets: 81 10*3/uL — ABNORMAL LOW (ref 150–400)
RBC: 2.7 MIL/uL — ABNORMAL LOW (ref 4.22–5.81)
RDW: 13.3 % (ref 11.5–15.5)
WBC: 9.5 10*3/uL (ref 4.0–10.5)
nRBC: 0 % (ref 0.0–0.2)

## 2021-01-20 LAB — BASIC METABOLIC PANEL
Anion gap: 14 (ref 5–15)
BUN: 85 mg/dL — ABNORMAL HIGH (ref 6–20)
CO2: 21 mmol/L — ABNORMAL LOW (ref 22–32)
Calcium: 8.6 mg/dL — ABNORMAL LOW (ref 8.9–10.3)
Chloride: 104 mmol/L (ref 98–111)
Creatinine, Ser: 13.93 mg/dL — ABNORMAL HIGH (ref 0.61–1.24)
GFR, Estimated: 4 mL/min — ABNORMAL LOW (ref >60)
Glucose, Bld: 105 mg/dL — ABNORMAL HIGH (ref 70–99)
Potassium: 4.6 mmol/L (ref 3.5–5.1)
Sodium: 139 mmol/L (ref 135–145)

## 2021-01-20 NOTE — Plan of Care (Signed)
  Problem: Health Behavior/Discharge Planning: Goal: Ability to manage health-related needs will improve Outcome: Progressing   Problem: Clinical Measurements: Goal: Will remain free from infection Outcome: Progressing Goal: Diagnostic test results will improve Outcome: Progressing Goal: Respiratory complications will improve Outcome: Progressing Goal: Cardiovascular complication will be avoided Outcome: Progressing   

## 2021-01-20 NOTE — Progress Notes (Deleted)
D/c tele and IV. Went over AVS with pt and all questions were addressed.   Layn Kye S Hatsue Sime, RN  

## 2021-01-20 NOTE — Progress Notes (Signed)
Discharge instructions (including medications) discussed with and copy provided to patient/caregiver 

## 2021-01-20 NOTE — Plan of Care (Signed)
  Problem: Education: Goal: Knowledge of General Education information will improve Description: Including pain rating scale, medication(s)/side effects and non-pharmacologic comfort measures Outcome: Adequate for Discharge   

## 2021-01-20 NOTE — Progress Notes (Signed)
Mobility Specialist: Progress Note   01/20/21 1055  Mobility  Activity Refused mobility   Pt said he and his wife have walked three times this morning.  [facility] Lilyan Prete Mobility Specialist Mobility Specialist Phone #1: [phone] Mobility Specialist Phone #2: [phone]

## 2021-01-20 NOTE — Progress Notes (Addendum)
CARDIAC REHAB PHASE I   Offered to walk with pt. Pt declines, states independent ambulation this morning without difficulty. Noted improvement in his breathing. Pt educated on site care and restrictions. Encouraged continued mobility with emphasis on safety. Pt declines CRP II at this time. Anxious to d/c.  [id number] Rufina Falco, RN BSN 01/20/2021 9:10 AM

## 2021-01-20 NOTE — Discharge Summary (Addendum)
[place] VALVE TEAM  Discharge Summary    Patient ID: Daniel Solomon MRN: [id number]; DOB: 02-09-1961  Admit date: 01/19/2021 Discharge date: 01/20/2021  Primary Care Provider: Marin Olp, MD  Primary Cardiologist: Lauree Chandler, MD/ Dr. Cyndia Bent, MD (TAVR)   Discharge Diagnoses    Principal Problem:   Severe aortic stenosis Active Problems:   OSA (obstructive sleep apnea)   CKD (chronic kidney disease), stage III ([place])   CAD (coronary artery disease)   Mixed hyperlipidemia   HTN (hypertension)   Paroxysmal atrial fibrillation ([name])   Dilated aortic root ([name])   ESRD on peritoneal dialysis ([place])   S/P TAVR (transcatheter aortic valve replacement)  Allergies No Known Allergies  Diagnostic Studies/Procedures      TAVR OPERATIVE NOTE     Date of Procedure:                01/19/2021   Preoperative Diagnosis:      Severe Aortic Stenosis    Postoperative Diagnosis:    Same    Procedure:        Transcatheter Aortic Valve Replacement - Percutaneous Right Transfemoral Approach             Edwards Sapien 3 Ultra RSLTHV (size 26 mm, model # [id number], serial # [id number])              Co-Surgeons:                        Gaye Pollack, MD and Lauree Chandler, MD     Anesthesiologist:                  Wilfrid Lund, MD   Echocardiographer:              Edmonia James, MD   Pre-operative Echo Findings: Severe aortic stenosis Normal left ventricular systolic function   Post-operative Echo Findings: No paravalvular leak Normal left ventricular systolic function _____________   Echo 01/20/21: Completed but pending formal read at the time of discharge   History of Present Illness     Daniel Solomon is a 60 y.o. male with a history of ESRD on PD, CAD, HTN, HLD, sinus bradycardia, bicuspid aortic valve with moderate AS, sleep apnea, thoracic aortic aneurysm, and persistent atrial fibrillation on Eliquis who presented to  [facility] on 01/19/21 for planned TAVR.   Mr. Vest was admitted 10/2020 with unstable angina and was found to have severe stenosis in the mid to distal LAD treated with a 2.25 x 18 mm Resolute DES. He was also found to have moderate mid LAD and distal RCA disease. Prior echocardiogram from  12/2015 showed an LVE at 55-60%, mild AS, mildly dilated aortic root. Carotid dopplers at that time with mild disease. He previously wore an event monitor which showed atrial fibrillation and was started on Xarelto. Echo from 06/2019 showed LVEF at 60-65%, mild LVH, likely bicuspid aortic valve with mild aortic valve stenosis. Repeat echo from 08/2020 with LVEF at 60-65%, mild LVH, fused bicuspid aortic valve with thickened and calcified leaflets. Mean gradient noted to be 22 mmHg with a peak gradient at 36.8 mmHg, AVA 1.2 cm2, and dimensionless index 0.30 consistent with moderate aortic stenosis. Echo at [facility] 07/2020 with moderate to severe AS, normal LV function. Images were not available for review however report was accessed in [place] by Dr. Angelena Form. Mean gradient at that time was up to  31.6 mmHg, AVA 0.8 cm2, dimensionless index 0.30. He was told that he could not have a kidney transplant at [facility] unless he has TAVR first.    He was seen by Dr. Angelena Form 11/19/20 at which time he was set up for further imaging with CT's and LHC. Cath showed moderate diffuse LAD disease. Patent mid to distal LAD stent. Mild mid Circumflex disease. Large dominant RCA with moderately severe distal stenosis at the bifurcation into the PDA and posterolateral artery. Recommendations were to continue with TAVR. His distal RCA stenosis could be addressed with PCI but given his lack of angina or ischemic symptoms this would be best managed medically for now.   The patient has been evaluated by the multidisciplinary valve team and felt to have severe, symptomatic aortic stenosis and to be a suitable candidate for TAVR, which was  set up for 01/19/21.     Hospital Course     Severe AS: s/p successful TAVR with a 26 mm Edwards Sapien 3 Ultra THV RSL via the TF approach on 01/19/21. Post operative echo pending. Groin sites are stable. ECG with AF and no high grade heart block. Continue renally dosed Eliquis. SBE discussed and will be RX'ed at follow up next week. Post procedure site care reviewed. The patient has ambulated with CR without complication. He was seen by nephrology due to PD. Plan was to resume PD at home this evening, 01/20/21.   He did initially have issues with hypotension with arterial systolic pressures in the 70-80's. He was given 289ml bolus per anesthesia on arrival to the holding area. Another 261ml was given on my assessment. BP cuff and arterial pressures remained labile despite fluid bolus. The patient was rather sedated after anesthesia, which was felt to be the likely culprit of his hypotension. There was discussion about starting short term pressors however BP improved as anesthesia wore off.  He was asymptomatic.   HTN: BP stable with no changes needed. Continue current therapy   HLD: LDL 39 in July 2022. Continue statin.      Atrial fibrillation, persistent: Rate controlled today. Continue reduced dose Eliquis due to renal dysfunction.    CAD without angina: Denies anginal symptoms. No beta blocker with bradycardia. Continue statin. No ASA since he is on Eliquis   Carotid artery disease: He is known to have mild bilateral carotid disease by dopplers 2017. Plan to repeat dopplers in the OP setting.   Incidental findings: Enlarging exophytic intermediate attenuation lesion in the lower pole of the left kidney. Although does may simply represent a proteinaceous/hemorrhagic cysts, further characterization with nonemergent MRI of the abdomen with and without IV gadolinium is recommended in the near future to better characterize this finding and exclude the possibility of a small renal neoplasm. Renal  mass that was seen previously on MRI and felt to be a cyst. Urology will not sign off. We offered to repeat MRI but cannot get after 1/25 due to iron infusion on 10/25. Plan to continue with valve replacement and evaluate further after. Pulmonary nodules if high risk, CT in 12 mo.   Consultants: The patient was seen and examined by Dr. Angelena Form who feels that he is stable and ready for discharge today, 01/20/21.   ____________  Discharge Vitals Blood pressure 129/83, pulse 92, temperature 98.3 F (36.8 C), temperature source Oral, resp. rate 19, height 5\' 8"  (1.727 m), weight 95.7 kg, SpO2 98 %.  Filed Weights   01/19/21 0547 01/20/21 0424  Weight: 95.4 kg 95.7  kg   General: Well developed, well nourished, NAD Neck: Negative for carotid bruits. No JVD Lungs:Clear to ausculation bilaterally. Breathing is unlabored. Cardiovascular: Irregularly irregular. No murmur Extremities: No edema.  Neuro: Alert and oriented. No focal deficits. No facial asymmetry. MAE spontaneously. Psych: Responds to questions appropriately with normal affect.    Labs & Radiologic Studies    CBC Recent Labs    01/19/21 1012 01/20/21 0142  WBC  --  9.5  HGB 10.5* 9.5*  HCT 31.0* 28.1*  MCV  --  104.1*  PLT  --  81*   Basic Metabolic Panel Recent Labs    01/19/21 1012 01/20/21 0142  NA 141 139  K 3.8 4.6  CL 106 104  CO2  --  21*  GLUCOSE 154* 105*  BUN 63* 85*  CREATININE 13.80* 13.93*  CALCIUM  --  8.6*   Liver Function Tests No results for input(s): AST, ALT, ALKPHOS, BILITOT, PROT, ALBUMIN in the last 72 hours. No results for input(s): LIPASE, AMYLASE in the last 72 hours. Cardiac Enzymes No results for input(s): CKTOTAL, CKMB, CKMBINDEX, TROPONINI in the last 72 hours. BNP Invalid input(s): POCBNP D-Dimer No results for input(s): DDIMER in the last 72 hours. Hemoglobin A1C No results for input(s): HGBA1C in the last 72 hours. Fasting Lipid Panel No results for input(s): CHOL, HDL,  LDLCALC, TRIG, CHOLHDL, LDLDIRECT in the last 72 hours. Thyroid Function Tests No results for input(s): TSH, T4TOTAL, T3FREE, THYROIDAB in the last 72 hours.  Invalid input(s): FREET3 _____________  CT ABDOMEN PELVIS W WO CONTRAST  Result Date: 12/23/2020 CLINICAL DATA:  60 year old male with history of severe aortic stenosis. Preprocedural study prior to potential transcatheter aortic valve replacement (TAVR) procedure. EXAM: CT ANGIOGRAPHY CHEST, ABDOMEN AND PELVIS TECHNIQUE: Multidetector CT imaging through the chest, abdomen and pelvis was performed using the standard protocol during bolus administration of intravenous contrast. Multiplanar reconstructed images and MIPs were obtained and reviewed to evaluate the vascular anatomy. CONTRAST:  125mL OMNIPAQUE IOHEXOL 350 MG/ML SOLN COMPARISON:  None. FINDINGS: CTA CHEST FINDINGS Cardiovascular: Heart size is borderline enlarged. There is no significant pericardial fluid, thickening or pericardial calcification. There is aortic atherosclerosis, as well as atherosclerosis of the great vessels of the mediastinum and the coronary arteries, including calcified atherosclerotic plaque in the left main, left anterior descending, left circumflex and right coronary arteries. Severe thickening and calcification of the aortic valve. Mediastinum/Lymph Nodes: No pathologically enlarged mediastinal or hilar lymph nodes. Esophagus is unremarkable in appearance. No axillary lymphadenopathy. Lungs/Pleura: No acute consolidative airspace disease. No pleural effusions. A few scattered small pulmonary nodules are noted in the lungs bilaterally, largest of which is in the medial segment of the right middle lobe measuring 4 mm (axial image 86 of series 5). Musculoskeletal/Soft Tissues: There are no aggressive appearing lytic or blastic lesions noted in the visualized portions of the skeleton. CTA ABDOMEN AND PELVIS FINDINGS Hepatobiliary: No suspicious cystic or solid hepatic  lesions. No intra or extrahepatic biliary ductal dilatation. Gallbladder is normal in appearance. Pancreas: No pancreatic mass. No pancreatic ductal dilatation. No pancreatic or peripancreatic fluid collections or inflammatory changes. Spleen: Unremarkable. Adrenals/Urinary Tract: Severe atrophy of the left kidney. Moderate atrophy of the right kidney. Multiple low-attenuation lesions in both kidneys, compatible with simple cysts. In addition, in the lower pole of the left kidney (axial image 146 of series 4) there is an exophytic 3.0 cm intermediate attenuation (43 HU) lesion which is increased in size compared to prior abdominal MRI 07/09/2019. No  hydroureteronephrosis. Urinary bladder is normal in appearance. Bilateral adrenal glands are normal in appearance. Stomach/Bowel: The appearance of the stomach is normal. No pathologic dilatation of small bowel or colon. Normal appendix. Vascular/Lymphatic: Aortic atherosclerosis, with vascular findings and measurements pertinent to potential TAVR procedure, as detailed below. No aneurysm or dissection noted in the abdominal or pelvic vasculature. No lymphadenopathy noted in the abdomen or pelvis. Reproductive: Prostate gland and seminal vesicles are unremarkable in appearance. Other: Tenckhoff peritoneal dialysis catheter with tip coiled in the low anatomic pelvis. Small volume of fluid in the peritoneal cavity, presumably peritoneal dialysate. No pneumoperitoneum. Musculoskeletal: There are no aggressive appearing lytic or blastic lesions noted in the visualized portions of the skeleton. VASCULAR MEASUREMENTS PERTINENT TO TAVR: AORTA: Minimal Aortic Diameter-15 x 17 mm Severity of Aortic Calcification-moderate RIGHT PELVIS: Right Common Iliac Artery - Minimal Diameter-9.0 x 9.9 mm Tortuosity-mild Calcification-moderate Right External Iliac Artery - Minimal Diameter-9.4 x 9.9 mm Tortuosity-moderate Calcification-minimal Right Common Femoral Artery - Minimal Diameter-8.9  x 9.1 mm Tortuosity-mild Calcification-mild LEFT PELVIS: Left Common Iliac Artery - Minimal Diameter-10.8 x 11.1 mm Tortuosity-moderate Calcification-mild Left External Iliac Artery - Minimal Diameter-9.9 x 9.6 mm Tortuosity-mild Calcification-none Left Common Femoral Artery - Minimal Diameter-9.9 x 8.4 mm Tortuosity-mild Calcification-mild Review of the MIP images confirms the above findings. IMPRESSION: 1. Vascular findings and measurements pertinent to potential TAVR procedure, as detailed above. 2. Severe thickening and calcification of the aortic valve, compatible with reported clinical history of severe aortic stenosis. 3. Enlarging exophytic intermediate attenuation lesion in the lower pole of the left kidney. Although does may simply represent a proteinaceous/hemorrhagic cysts, further characterization with nonemergent MRI of the abdomen with and without IV gadolinium is recommended in the near future to better characterize this finding and exclude the possibility of a small renal neoplasm. 4. Aortic atherosclerosis, in addition to left main and 3 vessel coronary artery disease. Please note that although the presence of coronary artery calcium documents the presence of coronary artery disease, the severity of this disease and any potential stenosis cannot be assessed on this non-gated CT examination. Assessment for potential risk factor modification, dietary therapy or pharmacologic therapy may be warranted, if clinically indicated. 5. Small pulmonary nodules measuring 4 mm or less in size, nonspecific, but statistically likely benign. No follow-up needed if patient is low-risk (and has no known or suspected primary neoplasm). Non-contrast chest CT can be considered in 12 months if patient is high-risk. This recommendation follows the consensus statement: Guidelines for Management of Incidental Pulmonary Nodules Detected on CT Images: From the Fleischner Society 2017; Radiology 2017; 284:228-243. 6. Moderate  right and severe left renal atrophy. 7. Additional incidental findings, as above. Electronically Signed   By: Vinnie Langton M.D.   On: 12/23/2020 08:30   DG Chest 2 View  Result Date: 01/15/2021 CLINICAL DATA:  Preoperative evaluation for Transcatheter aortic valve replacement. Dry cough. EXAM: CHEST - 2 VIEW COMPARISON:  CTA chest 12/22/2020. FINDINGS: Heart size within normal limits. Atherosclerotic calcification of the aortic knob. No focal airspace consolidation, pleural effusion, or pneumothorax. No acute bony findings. IMPRESSION: No active cardiopulmonary disease. Electronically Signed   By: Davina Poke D.O.   On: 01/15/2021 16:30   CT CORONARY MORPH W/CTA COR W/SCORE W/CA W/CM &/OR WO/CM  Addendum Date: 12/23/2020   ADDENDUM REPORT: 12/23/2020 09:06 CLINICAL DATA:  Severe Aortic Stenosis. EXAM: Cardiac TAVR CT TECHNIQUE: A non-contrast, gated CT scan was obtained with axial slices of 3 mm through the heart for aortic valve  calcium scoring. A 120 kV retrospective, gated, contrast cardiac scan was obtained. Gantry rotation speed was 250 msecs and collimation was 0.6 mm. Nitroglycerin was not given. The 3D data set was reconstructed in 5% intervals of the 0-95% of the R-R cycle. Systolic and diastolic phases were analyzed on a dedicated workstation using MPR, MIP, and VRT modes. The patient received 100 cc of contrast. FINDINGS: Image quality: Excellent. Noise artifact is: Limited. Valve Morphology: Bicuspid aortic valve with fusion of the RCC/NCC with raphe (Sievers type 1). There is bulky calcification of the raphe. Leaflets are severely calcified with restricted leaflet movement in systole. Aortic Valve Calcium score: 2103 Aortic annular dimension: Phase assessed: 25% Annular area: 513 mm2 Annular perimeter: 82.0 mm Max diameter: 27.8 mm Min diameter: 24.2 mm Annular and subannular calcification: None. Optimal coplanar projection: LAO 4 CRA 8 Coronary Artery Height above Annulus: Left Main:  14.3 mm Right Coronary: 11.2 mm Sinus of Valsalva Measurements: Maximum diameter: 36 mm Inter-commissural diameter: 30 mm Sinus of Valsalva Height: Non-coronary: 19.0 mm Right-coronary: 19.0 mm Left-coronary: 26.7 mm Sinotubular Junction: 33 mm Ascending Thoracic Aorta: Aneurysmal up to 42 mm (double oblique). Coronary Arteries: Normal coronary origin. Right dominance. The study was performed without use of NTG and is insufficient for plaque evaluation. Please refer to recent cardiac catheterization for coronary assessment. 3-vessel coronary calcifications noted. Cardiac Morphology: Right Atrium: Right atrial size is within normal limits. Right Ventricle: The right ventricular cavity is within normal limits. Left Atrium: Left atrial size is normal in size with no left atrial appendage filling defect. Left Ventricle: The ventricular cavity size is within normal limits. There are no stigmata of prior infarction. There is no abnormal filling defect. Pulmonary arteries: Normal in size without proximal filling defect. Pulmonary veins: Normal pulmonary venous drainage. Pericardium: Normal thickness with no significant effusion or calcium present. Mitral Valve: The mitral valve is normal structure without significant calcification. Extra-cardiac findings: See attached radiology report for non-cardiac structures. IMPRESSION: 1. Bicuspid aortic valve with fusion of the RCC/NCC with raphe (Sievers type 1). Bulky calcification of the raphe noted. 2. Annular measurements support a 26 mm S3 TAVR (513 mm2). 3. No significant annular or subannular calcifications. 4. Sufficient coronary to annulus distance. 5. Optimal Fluoroscopic Angle for Delivery: LAO 4 CRA 8 6. 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 [place] T. Audie Box, MD Electronically Signed   By: Eleonore Chiquito M.D.   On: 12/23/2020 09:06   Result Date: 12/23/2020 EXAM: OVER-READ INTERPRETATION  CT CHEST The following report is an over-read  performed by radiologist Dr. Vinnie Langton of [facility], [place] on 12/23/2020. This over-read does not include interpretation of cardiac or coronary anatomy or pathology. The coronary calcium score/coronary CTA interpretation by the cardiologist is attached. COMPARISON:  None. FINDINGS: Extracardiac findings will be described separately under dictation for contemporaneously obtained CTA chest, abdomen and pelvis. IMPRESSION: Please see separate dictation for contemporaneously obtained CTA chest, abdomen and pelvis dated 12/22/2020 for full description of relevant extracardiac findings. Electronically Signed: By: Vinnie Langton M.D. On: 12/23/2020 05:14   CT ANGIO CHEST AORTA W/CM & OR WO/CM  Result Date: 12/23/2020 CLINICAL DATA:  60 year old male with history of severe aortic stenosis. Preprocedural study prior to potential transcatheter aortic valve replacement (TAVR) procedure. EXAM: CT ANGIOGRAPHY CHEST, ABDOMEN AND PELVIS TECHNIQUE: Multidetector CT imaging through the chest, abdomen and pelvis was performed using the standard protocol during bolus administration of intravenous contrast. Multiplanar reconstructed images and MIPs were  obtained and reviewed to evaluate the vascular anatomy. CONTRAST:  164mL OMNIPAQUE IOHEXOL 350 MG/ML SOLN COMPARISON:  None. FINDINGS: CTA CHEST FINDINGS Cardiovascular: Heart size is borderline enlarged. There is no significant pericardial fluid, thickening or pericardial calcification. There is aortic atherosclerosis, as well as atherosclerosis of the great vessels of the mediastinum and the coronary arteries, including calcified atherosclerotic plaque in the left main, left anterior descending, left circumflex and right coronary arteries. Severe thickening and calcification of the aortic valve. Mediastinum/Lymph Nodes: No pathologically enlarged mediastinal or hilar lymph nodes. Esophagus is unremarkable in appearance. No axillary lymphadenopathy. Lungs/Pleura:  No acute consolidative airspace disease. No pleural effusions. A few scattered small pulmonary nodules are noted in the lungs bilaterally, largest of which is in the medial segment of the right middle lobe measuring 4 mm (axial image 86 of series 5). Musculoskeletal/Soft Tissues: There are no aggressive appearing lytic or blastic lesions noted in the visualized portions of the skeleton. CTA ABDOMEN AND PELVIS FINDINGS Hepatobiliary: No suspicious cystic or solid hepatic lesions. No intra or extrahepatic biliary ductal dilatation. Gallbladder is normal in appearance. Pancreas: No pancreatic mass. No pancreatic ductal dilatation. No pancreatic or peripancreatic fluid collections or inflammatory changes. Spleen: Unremarkable. Adrenals/Urinary Tract: Severe atrophy of the left kidney. Moderate atrophy of the right kidney. Multiple low-attenuation lesions in both kidneys, compatible with simple cysts. In addition, in the lower pole of the left kidney (axial image 146 of series 4) there is an exophytic 3.0 cm intermediate attenuation (43 HU) lesion which is increased in size compared to prior abdominal MRI 07/09/2019. No hydroureteronephrosis. Urinary bladder is normal in appearance. Bilateral adrenal glands are normal in appearance. Stomach/Bowel: The appearance of the stomach is normal. No pathologic dilatation of small bowel or colon. Normal appendix. Vascular/Lymphatic: Aortic atherosclerosis, with vascular findings and measurements pertinent to potential TAVR procedure, as detailed below. No aneurysm or dissection noted in the abdominal or pelvic vasculature. No lymphadenopathy noted in the abdomen or pelvis. Reproductive: Prostate gland and seminal vesicles are unremarkable in appearance. Other: Tenckhoff peritoneal dialysis catheter with tip coiled in the low anatomic pelvis. Small volume of fluid in the peritoneal cavity, presumably peritoneal dialysate. No pneumoperitoneum. Musculoskeletal: There are no  aggressive appearing lytic or blastic lesions noted in the visualized portions of the skeleton. VASCULAR MEASUREMENTS PERTINENT TO TAVR: AORTA: Minimal Aortic Diameter-15 x 17 mm Severity of Aortic Calcification-moderate RIGHT PELVIS: Right Common Iliac Artery - Minimal Diameter-9.0 x 9.9 mm Tortuosity-mild Calcification-moderate Right External Iliac Artery - Minimal Diameter-9.4 x 9.9 mm Tortuosity-moderate Calcification-minimal Right Common Femoral Artery - Minimal Diameter-8.9 x 9.1 mm Tortuosity-mild Calcification-mild LEFT PELVIS: Left Common Iliac Artery - Minimal Diameter-10.8 x 11.1 mm Tortuosity-moderate Calcification-mild Left External Iliac Artery - Minimal Diameter-9.9 x 9.6 mm Tortuosity-mild Calcification-none Left Common Femoral Artery - Minimal Diameter-9.9 x 8.4 mm Tortuosity-mild Calcification-mild Review of the MIP images confirms the above findings. IMPRESSION: 1. Vascular findings and measurements pertinent to potential TAVR procedure, as detailed above. 2. Severe thickening and calcification of the aortic valve, compatible with reported clinical history of severe aortic stenosis. 3. Enlarging exophytic intermediate attenuation lesion in the lower pole of the left kidney. Although does may simply represent a proteinaceous/hemorrhagic cysts, further characterization with nonemergent MRI of the abdomen with and without IV gadolinium is recommended in the near future to better characterize this finding and exclude the possibility of a small renal neoplasm. 4. Aortic atherosclerosis, in addition to left main and 3 vessel coronary artery disease. Please note that although the  presence of coronary artery calcium documents the presence of coronary artery disease, the severity of this disease and any potential stenosis cannot be assessed on this non-gated CT examination. Assessment for potential risk factor modification, dietary therapy or pharmacologic therapy may be warranted, if clinically indicated.  5. Small pulmonary nodules measuring 4 mm or less in size, nonspecific, but statistically likely benign. No follow-up needed if patient is low-risk (and has no known or suspected primary neoplasm). Non-contrast chest CT can be considered in 12 months if patient is high-risk. This recommendation follows the consensus statement: Guidelines for Management of Incidental Pulmonary Nodules Detected on CT Images: From the Fleischner Society 2017; Radiology 2017; 284:228-243. 6. Moderate right and severe left renal atrophy. 7. Additional incidental findings, as above. Electronically Signed   By: Vinnie Langton M.D.   On: 12/23/2020 08:30   ECHOCARDIOGRAM LIMITED  Result Date: 01/19/2021    ECHOCARDIOGRAM LIMITED REPORT   Patient Name:   Daniel Solomon Date of Exam: 01/19/2021 Medical Rec #:  [id number]      Height:       68.0 in Accession #:    [id number]     Weight:       210.3 lb Date of Birth:  03-31-1960     BSA:          2.088 m Patient Age:    60 years       BP:           128/79 mmHg Patient Gender: M              HR:           76 bpm. Exam Location:  Inpatient Procedure: Limited Echo, Cardiac Doppler and Color Doppler Indications:     Aortic stenosis I35.0  History:         Patient has prior history of Echocardiogram examinations, most                  recent 09/25/2020. CAD, Aortic Valve Disease, Arrythmias:Atrial                  Fibrillation; Risk Factors:Dyslipidemia, Hypertension and Sleep                  Apnea. End stage renal disease.                  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 Procedure Date: 01/19/2021.  Sonographer:     Darlina Sicilian RDCS Referring Phys:  [place] Diagnosing Phys: Jenkins Rouge MD IMPRESSIONS  1. EF 60-65% . Left ventricular ejection fraction, by estimation, is 60 to 65%. The left ventricle has normal function. There is mild left ventricular hypertrophy.  2. Right ventricular  systolic function is normal. The right ventricular size is normal.  3. Left atrial size was mildly dilated.  4. The mitral valve is abnormal. Trivial mitral valve regurgitation.  5. Pre TAVR: bicuspid AV with fused right and non coronary cusp. Severe AS mean gradient 45 peak 71 mmHg AVA 0.81 cm 2         Post TAVR: 26 mm Sapien 3 valve Deployed somewhat high but covering leaflets and no significant PVL mean gradient 6 peak 13 mmHg AVA 2.6 cm2. The aortic valve is bicuspid. There is severe calcifcation of the aortic valve. There  is moderate thickening  of the aortic valve. Aortic valve regurgitation is not visualized. Severe aortic valve stenosis. 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 Procedure Date: 01/19/2021.  6. Aortic dilatation noted. There is moderate dilatation of the ascending aorta, measuring 42 mm. FINDINGS  Left Ventricle: EF 60-65%. Left ventricular ejection fraction, by estimation, is 60 to 65%. The left ventricle has normal function. The left ventricular internal cavity size was normal in size. There is mild left ventricular hypertrophy. Right Ventricle: The right ventricular size is normal. No increase in right ventricular wall thickness. Right ventricular systolic function is normal. Left Atrium: Left atrial size was mildly dilated. Right Atrium: Right atrial size was normal in size. Pericardium: There is no evidence of pericardial effusion. Mitral Valve: The mitral valve is abnormal. There is mild thickening of the mitral valve leaflet(s). There is mild calcification of the mitral valve leaflet(s). Mild mitral annular calcification. Trivial mitral valve regurgitation. Tricuspid Valve: The tricuspid valve is not assessed. Aortic Valve: Pre TAVR: bicuspid AV with fused right and non coronary cusp. Severe AS mean gradient 45 peak 71 mmHg AVA 0.81 cm 2 Post TAVR: 26 mm Sapien 3 valve Deployed somewhat high but covering leaflets and no significant PVL mean  gradient 6 peak 13 mmHg AVA 2.6 cm2. The aortic valve is bicuspid. There is severe calcifcation of the aortic valve. There is moderate thickening of the aortic valve. Aortic valve regurgitation is not visualized. Severe aortic stenosis is present. Aortic valve mean gradient measures 25.5 mmHg. Aortic valve peak gradient measures 35.6 mmHg. Aortic valve area, by VTI measures 1.28 cm. There is a 26 mm  Sapien prosthetic, stented (TAVR) valve present in the aortic position. Procedure Date: 01/19/2021. Pulmonic Valve: The pulmonic valve was not assessed. Aorta: Aortic dilatation noted. There is moderate dilatation of the ascending aorta, measuring 42 mm. IAS/Shunts: The interatrial septum was not assessed. LEFT VENTRICLE PLAX 2D LVOT diam:     2.50 cm LV SV:         89 LV SV Index:   43 LVOT Area:     4.91 cm  AORTIC VALVE AV Area (Vmax):    1.64 cm AV Area (Vmean):   1.54 cm AV Area (VTI):     1.28 cm AV Vmax:           298.50 cm/s AV Vmean:          216.500 cm/s AV VTI:            0.696 m AV Peak Grad:      35.6 mmHg AV Mean Grad:      25.5 mmHg LVOT Vmax:         99.80 cm/s LVOT Vmean:        68.100 cm/s LVOT VTI:          0.181 m LVOT/AV VTI ratio: 0.26  AORTA Ao Root diam: 4.26 cm  SHUNTS Systemic VTI:  0.18 m Systemic Diam: 2.50 cm Jenkins Rouge MD Electronically signed by Jenkins Rouge MD Signature Date/Time: 01/19/2021/10:01:04 AM    Final    Structural Heart Procedure  Result Date: 01/19/2021 See surgical note for result.  Disposition   Pt is being discharged home today in good condition.  Follow-up Plans & Appointments    Follow-up Information     Eileen Stanford, PA-C. Go on 01/27/2021.   Specialties: Cardiology, Radiology Why: at 3pm. Please arrive to your appointment by 2:45pm Contact information: [address] [place] [id number] [phone]                Discharge Instructions     Call MD for:  difficulty breathing, headache or visual disturbances    Complete by: As directed    Call MD for:  extreme fatigue   Complete by: As directed    Call MD for:  hives   Complete by: As directed    Call MD for:  persistant dizziness or light-headedness   Complete by: As directed    Call MD for:  persistant nausea and vomiting   Complete by: As directed    Call MD for:  redness, tenderness, or signs of infection (pain, swelling, redness, odor or green/yellow discharge around incision site)   Complete by: As directed    Call MD for:  severe uncontrolled pain   Complete by: As directed    Call MD for:  temperature >100.4   Complete by: As directed    Diet - low sodium heart healthy   Complete by: As directed    Discharge instructions   Complete by: As directed    Please resume your Eliquis this evening, 01/20/21.   ACTIVITY AND EXERCISE  Daily activity and exercise are an important part of your recovery. People recover at different rates depending on their general health and type of valve procedure.  Most people recovering from TAVR feel better relatively quickly   No lifting, pushing, pulling more than 10 pounds (examples to avoid: groceries, vacuuming, gardening, golfing):             - For one week with a procedure through the groin.             - For six weeks for procedures through the chest wall or neck. NOTE: You will typically see one of our providers 7-14 days after your procedure to discuss [place] the above activities.      DRIVING  Do not drive until you are seen for follow up and cleared by a provider. Generally, we ask patient to not drive for 1 week after their procedure.  If you have been told by your doctor in the past that you may not drive, you must talk with him/her before you begin driving again.   DRESSING  Groin site: you may leave the clear dressing over the site for up to one week or until it falls off.   HYGIENE  If you had a femoral (leg) procedure, you may take a shower when you return home.  After the shower, pat the site dry. Do NOT use powder, oils or lotions in your groin area until the site has completely healed.  If you had a chest procedure, you may shower when you return home unless specifically instructed not to by your discharging practitioner.             - DO NOT scrub incision; pat dry with a towel.             - DO NOT apply any lotions, oils, powders to the incision.             - No tub baths / swimming for at least 2 weeks.  If you notice any fevers, chills, increased pain, swelling, bleeding or pus, please contact your doctor.   ADDITIONAL INFORMATION  If you are going to have an upcoming dental procedure, please contact our office as you will require antibiotics ahead of time to  prevent infection on your heart valve.    If you have any questions or concerns you can call the structural heart phone during normal business hours 8am-4pm. If you have an urgent need after hours or weekends please call [phone] to talk to the on call provider for general cardiology. If you have an emergency that requires immediate attention, please call 911.    After TAVR Checklist  Check  Test Description  Follow up appointment in 1-2 weeks  You will see our structural heart advanced practice provider. Your incision sites will be checked and you will be cleared to drive and resume all normal activities if you are doing well.    1 month echo and follow up  You will have an echo to check on your new heart valve and be seen back in the office by a structural heart advanced practice provider.  Follow up with your primary cardiologist You will need to be seen by your primary cardiologist in the following 3-6 months after your 1 month appointment in the valve clinic. Often times your Plavix or Aspirin will be discontinued during this time, but this is decided on a case by case basis.   1 year echo and follow up You will have another echo to check on your heart valve after 1 year and be  seen back in the office by a structural heart advanced practice provider. This your last structural heart visit.  Bacterial endocarditis prophylaxis  You will have to take antibiotics for the rest of your life before all dental procedures (even teeth cleanings) to protect your heart valve. Antibiotics are also required before some surgeries. Please check with your cardiologist before scheduling any surgeries. Also, please make sure to tell us if you have a penicillin allergy as you will require an alternative antibiotic.   If the dressing is still on your incision site when you go home, remove it on the third day after your surgery date. Remove dressing if it begins to fall off, or if it is dirty or damaged before the third day.   Complete by: As directed    Increase activity slowly   Complete by: As directed       Discharge Medications   Allergies as of 01/20/2021   No Known Allergies      Medication List     TAKE these medications    allopurinol 300 MG tablet Commonly known as: ZYLOPRIM TAKE 1 TABLET BY MOUTH EVERY DAY   atorvastatin 40 MG tablet Commonly known as: LIPITOR TAKE 1 TABLET BY MOUTH EVERY DAY   Auryxia 1 GM 210 MG(Fe) tablet Generic drug: ferric citrate Take 420 mg by mouth 3 (three) times daily.   docusate sodium 100 MG capsule Commonly known as: COLACE Take 100 mg by mouth daily.   Eliquis 2.5 MG Tabs tablet Generic drug: apixaban TAKE 1 TABLET BY MOUTH TWICE A DAY   gentamicin ointment 0.1 % Commonly known as: GARAMYCIN Apply 1 application topically daily.   iron sucrose in sodium chloride 0.9 % 100 mL Iron Sucrose (Venofer)   labetalol 100 MG tablet Commonly known as: NORMODYNE Take 100 mg by mouth 2 (two) times daily.   MIRCERA IJ Inject into the skin as needed.   torsemide 100 MG tablet Commonly known as: DEMADEX Take 100 mg by mouth daily.               Discharge Care Instructions  (From admission, onward)  Start      Ordered   01/20/21 0000  If the dressing is still on your incision site when you go home, remove it on the third day after your surgery date. Remove dressing if it begins to fall off, or if it is dirty or damaged before the third day.        01/20/21 0938            Outstanding Labs/Studies   None   Duration of Discharge Encounter   Greater than 30 minutes including physician time.  SignedKathyrn Drown, NP 01/20/2021, 9:38 AM [phone]   I have personally seen and examined this patient. I agree with the assessment and plan as outlined above.  Doing well post TAVR. Echo images reviewed by me. No significant PVL. BP stable. Groins stable. Will d/c home today. He is back on his home Eliquis dose.   Lauree Chandler 01/20/2021 10:09 AM

## 2021-01-20 NOTE — Progress Notes (Signed)
  Echocardiogram 2D Echocardiogram s/p TAVR has been performed.  Daniel Solomon M 01/20/2021, 8:25 AM

## 2021-01-23 NOTE — Progress Notes (Signed)
HEART AND [place]                                     Cardiology Office Note:    Date:  01/28/2021   ID:  [name], DOB 24-Apr-1960, MRN [id number]  PCP:  Marin Olp, MD  [facility] Cardiologist:  Lauree Chandler, MD / Dr. Cyndia Bent, MD (TAVR)  [facility] Electrophysiologist:  None   Referring MD: Marin Olp, MD   [facility] s/p TAVR  History of Present Illness:    GIOVANNIE SCERBO is a 60 y.o. male with a hx of CAD, HTN, HLD, sinus bradycardia, sleep apnea, thoracic aortic aneurysm, persistent atrial fibrillation on Eliquis, ESRD on PD in the work up for renal transplantation and bicuspid aortic valve with moderate AS s/p TAVR (01/19/21) who presents to clinic for follow up.   Mr. Wickstrom was admitted 10/2010 with unstable angina and was found to have severe stenosis in the mid to distal LAD treated with a 2.25 x 18 mm Resolute DES. He was also found to have moderate mid LAD and distal RCA disease. Prior echocardiogram from 12/2015 showed an LVE at 55-60%, mild AS, mildly dilated aortic root. Carotid dopplers at that time with mild disease. He previously wore an event monitor which showed atrial fibrillation and was started on Xarelto. Echo from 06/2019 showed LVEF at 60-65%, mild LVH, likely bicuspid aortic valve with mild aortic valve stenosis. Repeat echo from 08/2020 with LVEF at 60-65%, mild LVH, fused bicuspid aortic valve with thickened and calcified leaflets. Mean gradient noted to be 22 mmHg with a peak gradient at 36.8 mmHg, AVA 1.2 cm2, and dimensionless index 0.30 consistent with moderate aortic stenosis. Echo at [facility] 07/2020 with moderate to severe AS, normal LV function. Images were not available for review however report was accessed in [place] by Dr. Angelena Form. Mean gradient at that time was up to 31.6 mmHg, AVA 0.8 cm2, dimensionless index 0.30. He was told that he could not have a kidney  transplant at [facility] unless he has TAVR first.   He was evaluated by the multidisciplinary valve team and underwent successful TAVR with a 26 mm Edwards S3UR THV via the TF approach on 01/19/21. Post operative echo showed EF 60%, normally functioning TAVR with a mean gradient of 12 mmHg and no PVL. He was resumed on home renally dosed Eliquis.  Today the patient presents to clinic for follow up. Here with wife. Doing well. Has not had any issue from surgery except that is precipitated a gout flare that is quite painful. No CP or SOB. No LE edema, orthopnea or PND. No dizziness or syncope. No blood in stool or urine. No palpitations.   Past Medical History:  Diagnosis Date   Anemia    Bicuspid aortic valve    CAD (coronary artery disease)    proximal LAD 50%, mid LAD 50-60%, mid to distal LAD 99%; mid circumflex 20%; proximal RCA 20%, distal RCA 50%; PDA 60-70%, proximal posterior AV groove 50%, proximal posterior lateral 50%.  There was a question of possible LVOT gradient;  s/p Resolute DES to mid-distal LAD 10/2010;  echocardiogram 11/17/10: EF 55-60%, mild LVH, grade 1 diastolic dysfunction, normal aortic valve, mild MR, PASP 32.    Chicken pox    Enlarged aorta ([name])    ESRD on peritoneal dialysis ([place])  Glaucoma    Gout    allopurinol 300mg , colchicine prn in past, has not had flares   Hypercholesterolemia    With hypertriglyceridemia   Hypertension    Kidney stones    hx hematuria as result. cystosopy normal   Mild aortic stenosis    PAF (paroxysmal atrial fibrillation) ([place])    a. Documented on event monitor 01/2016.   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   Sleep apnea    cpap nightly    Past Surgical History:  Procedure Laterality Date   CORONARY STENT PLACEMENT  2012   CYSTOSCOPY/RETROGRADE/URETEROSCOPY  03/26/2012   Procedure:  CYSTOSCOPY/RETROGRADE/URETEROSCOPY;  Surgeon: [name], MD;  Location: [facility];  Service: Urology;  Laterality: Bilateral;  CYSTOSCOPY BILATERAL RETROGRADE PYELOGRAM AND POSSIBLE URETEROSCOPY   INTRAOPERATIVE TRANSTHORACIC ECHOCARDIOGRAM N/A 01/19/2021   Procedure: INTRAOPERATIVE TRANSTHORACIC ECHOCARDIOGRAM;  Surgeon: Burnell Blanks, MD;  Location: [place];  Service: Open Heart Surgery;  Laterality: N/A;   RIGHT HEART CATH AND CORONARY ANGIOGRAPHY N/A 12/04/2020   Procedure: RIGHT HEART CATH AND CORONARY ANGIOGRAPHY;  Surgeon: Burnell Blanks, MD;  Location: [place] CV [facility];  Service: Cardiovascular;  Laterality: N/A;   TRANSCATHETER AORTIC VALVE REPLACEMENT, TRANSFEMORAL N/A 01/19/2021   Procedure: TRANSCATHETER AORTIC VALVE REPLACEMENT, TRANSFEMORAL USING A 26MM EDWARDS VALVE.;  Surgeon: Burnell Blanks, MD;  Location: [place];  Service: Open Heart Surgery;  Laterality: N/A;    Current Medications: Current Meds  Medication Sig   allopurinol (ZYLOPRIM) 300 MG tablet TAKE 1 TABLET BY MOUTH EVERY DAY   amoxicillin (AMOXIL) 500 MG tablet Take 4 tablets by mouth 1 hour prior to dental appointments and cleanings   atorvastatin (LIPITOR) 40 MG tablet TAKE 1 TABLET BY MOUTH EVERY DAY   AURYXIA 1 GM 210 MG(Fe) tablet Take 420 mg by mouth 3 (three) times daily.   colchicine 0.6 MG tablet Take 1 tablet (0.6 mg total) by mouth daily.   docusate sodium (COLACE) 100 MG capsule Take 100 mg by mouth daily.   ELIQUIS 2.5 MG TABS tablet TAKE 1 TABLET BY MOUTH TWICE A DAY   gentamicin ointment (GARAMYCIN) 0.1 % Apply 1 application topically daily.   iron sucrose in sodium chloride 0.9 % 100 mL Iron Sucrose (Venofer)   labetalol (NORMODYNE) 100 MG tablet Take 100 mg by mouth 2 (two) times daily.   Methoxy PEG-Epoetin Beta (MIRCERA IJ) Inject into the skin as needed.   torsemide (DEMADEX) 100 MG tablet Take 100 mg by mouth daily.     Allergies:   Patient has no  known allergies.   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 There is no history of Colon cancer.  ROS:   Please see the history of present illness.    All other systems reviewed and are negative.  EKGs/Labs/Other Studies Reviewed:    The following studies were reviewed today:  TAVR OPERATIVE NOTE     Date of Procedure:                01/19/2021   Preoperative Diagnosis:      Severe Aortic Stenosis    Postoperative Diagnosis:    Same    Procedure:        Transcatheter Aortic Valve Replacement - Percutaneous Right Transfemoral Approach             Edwards Sapien 3 Ultra RSLTHV (size 26 mm, model # 9755RSL, serial # [id number])              Co-Surgeons:                        Gaye Pollack, MD and Lauree Chandler,  MD     Anesthesiologist:                  Wilfrid Lund, MD   Echocardiographer:              Edmonia James, MD   Pre-operative Echo Findings: Severe aortic stenosis Normal left ventricular systolic function   Post-operative Echo Findings: No paravalvular leak Normal left ventricular systolic function  _____________   Echo 01/20/21:  IMPRESSIONS   1. Left ventricular ejection fraction, by estimation, is 60 to 65%. The  left ventricle has normal function. The left ventricle has no regional  wall motion abnormalities. There is mild left ventricular hypertrophy.  Left ventricular diastolic parameters  are indeterminate.   2. Right ventricular systolic function is normal. The right ventricular  size is normal.   3. Left atrial size was moderately dilated.   4. Right atrial size was mildly dilated.   5. The mitral valve is abnormal. Mild mitral valve regurgitation. No  evidence of mitral stenosis.   6. Post TAVR with 26 mm Sapien 3 valve Deployment somewhat high but  leaflets appear covered on limited views and No PvL mean gradient 12 peak  22 mmHg AVA 2.7 cm2. The aortic valve has been repaired/replaced. Aortic  valve regurgitation is not visualized.   No aortic stenosis is present. There is a 26 mm Sapien prosthetic (TAVR)  valve present in the aortic position. Procedure Date: 01/19/2021.   7. Aortic dilatation noted. There is mild dilatation of the ascending  aorta, measuring 41 mm.   8. The inferior vena cava is normal in size with greater than 50%  respiratory variability, suggesting right atrial pressure of 3 mmHg.   EKG:  EKG is ordered today.  The ekg ordered today demonstrates aifb  66   Recent Labs: 01/15/2021: ALT 21; B Natriuretic Peptide 123.8 01/20/2021: BUN 85; Creatinine, Ser 13.93; Hemoglobin 9.5; Platelets 81; Potassium 4.6; Sodium 139  Recent Lipid Panel    Component Value Date/Time   CHOL 95 (L) 08/28/2020 0737   TRIG 155 (H) 08/28/2020 0737   HDL 30 (L)  08/28/2020 0737   CHOLHDL 3.2 08/28/2020 0737   CHOLHDL 3 10/24/2018 1014   VLDL 37.4 10/24/2018 1014   LDLCALC 39 08/28/2020 0737   LDLDIRECT 70.9 02/10/2012 0737     Risk Assessment/Calculations:    CHA2DS2-VASc Score = 2   This indicates a 2.2% annual risk of stroke. The patient's score is based upon: CHF History: 0 HTN History: 1 Diabetes History: 0 Stroke History: 0 Vascular Disease History: 1 Age Score: 0 Gender Score: 0      Physical Exam:    VS:  BP 136/68   Pulse 66   Ht 5\' 8"  (1.727 m)   Wt 218 lb (98.9 kg)   SpO2 99%   BMI 33.15 kg/m     Wt Readings from Last 3 Encounters:  01/27/21 218 lb (98.9 kg)  01/20/21 210 lb 15.7 oz (95.7 kg)  01/15/21 219 lb 6.4 oz (99.5 kg)     GEN:  Well nourished, well developed in no acute distress HEENT: Normal NECK: No JVD LYMPHATICS: No lymphadenopathy CARDIAC: RRR, soft flow murmur. no rubs, gallops RESPIRATORY:  Clear to auscultation without rales, wheezing or rhonchi  ABDOMEN: Soft, non-tender, non-distended MUSCULOSKELETAL:  No edema; No deformity  SKIN: Warm and dry NEUROLOGIC:  Alert and oriented x 3 PSYCHIATRIC:  Normal affect   ASSESSMENT:    1. S/P TAVR (transcatheter aortic valve replacement)   2. Essential hypertension   3. Persistent atrial fibrillation ([place])   4. ESRD (end stage renal disease) ([place])   5. Renal mass   6. Pulmonary nodules    PLAN:    In order of problems listed above:  Severe AS s/p TAVR: doing well. He reports groin sites doing well, although he wouldn't let me examine them. ECG with persistent afib. Continue on Eliquis alone. SBE prophylaxis discussed; I have RX'd amoxicillin. I will see him back next month for follow up with echo.  HTN: BP stable with no changes needed. Continue current therapy  Atrial fibrillation, persistent: currently on Eliwuis 2.5mg  BID. I think this this is the incorrect dose for this man who is <80 yo, weight >60kg. I will discuss with pharmacy and  Dr. Buena Irish and make a decision.    CAD: Denies anginal symptoms. No beta blocker with bradycardia. Continue statin. No ASA since he is on Eliquis  ESRD on PD: he is hoping to proceed with renal transplant in the near future.    Renal mass: pre TAVR CTs showed an enlarging exophytic intermediate attenuation lesion in the lower pole of the left kidney. Although does may simply represent a proteinaceous/hemorrhagic cysts, further characterization with nonemergent MRI of the abdomen with and without IV gadolinium is recommended in the near future to better characterize this finding and exclude the possibility of a small renal neoplasm. Renal mass that was seen previously on MRI and felt to be a cyst. Urology will not sign off. We offered to repeat MRI but cannot get after 1/25 due to iron infusion on 10/25. This has been set up for January after 3 months out from this last infusion  Pulmonary nodules: pre TAVR CT showed pulmonary nodules. If pt high risk, consider CT in  12 mo. Patient does not appear to have any high risk features. No follow up recommended.   Acute gout: having a lot of pain in right foot. Will rx colchicine 0.6 x 1 which is the dose recommended for dialysis patients.    Medication Adjustments/Labs and Tests Ordered: Current medicines are reviewed at length with the patient today.  Concerns regarding medicines are outlined above.  Orders Placed This Encounter  Procedures   EKG 12-Lead   Meds ordered this encounter  Medications   colchicine 0.6 MG tablet    Sig: Take 1 tablet (0.6 mg total) by mouth daily.    Dispense:  1 tablet    Refill:  0   amoxicillin (AMOXIL) 500 MG tablet    Sig: Take 4 tablets by mouth 1 hour prior to dental appointments and cleanings    Dispense:  12 tablet    Refill:  6    Patient Instructions  Medication Instructions:  1) We have sent a prescription for Colchicine 0.6 mg, Take 1 tablet by mouth   2) Start Amoxicillin 500 mg, take 4 tablets  by mouth 1 hour prior to dental appointments and cleanings    *If you need a refill on your cardiac medications before your next appointment, please call your pharmacy*   Lab Work: None ordered   If you have labs (blood work) drawn today and your tests are completely normal, you will receive your results only by: MyChart Message (if you have MyChart) OR A paper copy in the mail If you have any lab test that is abnormal or we need to change your treatment, we will call you to review the results.   Testing/Procedures: None ordered    Follow-Up: Follow up as scheduled   Other Instructions None     Signed, Angelena Form, PA-C  01/28/2021 1:57 AM    [facility]

## 2021-01-27 ENCOUNTER — Encounter: Payer: Self-pay | Admitting: Physician Assistant

## 2021-01-27 ENCOUNTER — Other Ambulatory Visit: Payer: Self-pay

## 2021-01-27 ENCOUNTER — Ambulatory Visit (INDEPENDENT_AMBULATORY_CARE_PROVIDER_SITE_OTHER): Payer: Managed Care, Other (non HMO) | Admitting: Physician Assistant

## 2021-01-27 VITALS — BP 136/68 | HR 66 | Ht 68.0 in | Wt 218.0 lb

## 2021-01-27 DIAGNOSIS — N2889 Other specified disorders of kidney and ureter: Secondary | ICD-10-CM

## 2021-01-27 DIAGNOSIS — Z952 Presence of prosthetic heart valve: Secondary | ICD-10-CM | POA: Diagnosis not present

## 2021-01-27 DIAGNOSIS — I10 Essential (primary) hypertension: Secondary | ICD-10-CM | POA: Diagnosis not present

## 2021-01-27 DIAGNOSIS — N186 End stage renal disease: Secondary | ICD-10-CM | POA: Diagnosis not present

## 2021-01-27 DIAGNOSIS — R918 Other nonspecific abnormal finding of lung field: Secondary | ICD-10-CM

## 2021-01-27 DIAGNOSIS — I4819 Other persistent atrial fibrillation: Secondary | ICD-10-CM

## 2021-01-27 LAB — ECHOCARDIOGRAM COMPLETE
AR max vel: 2.63 cm2
AV Area VTI: 2.65 cm2
AV Area mean vel: 2.67 cm2
AV Mean grad: 11.3 mmHg
AV Peak grad: 20.4 mmHg
Ao pk vel: 2.26 m/s
Area-P 1/2: 4.81 cm2
Height: 68 in
S' Lateral: 3.2 cm
Weight: 3375.68 oz

## 2021-01-27 MED ORDER — COLCHICINE 0.6 MG PO TABS: 0.6000 mg | ORAL_TABLET | Freq: Every day | ORAL | 0 refills | Status: DC

## 2021-01-27 MED ORDER — AMOXICILLIN 500 MG PO TABS: ORAL_TABLET | ORAL | 6 refills | Status: DC

## 2021-01-27 NOTE — Patient Instructions (Addendum)
Medication Instructions:  1) We have sent a prescription for Colchicine 0.6 mg, Take 1 tablet by mouth   2) Start Amoxicillin 500 mg, take 4 tablets by mouth 1 hour prior to dental appointments and cleanings    *If you need a refill on your cardiac medications before your next appointment, please call your pharmacy*   Lab Work: None ordered   If you have labs (blood work) drawn today and your tests are completely normal, you will receive your results only by: [place] (if you have MyChart) OR A paper copy in the mail If you have any lab test that is abnormal or we need to change your treatment, we will call you to review the results.   Testing/Procedures: None ordered    Follow-Up: Follow up as scheduled   Other Instructions None

## 2021-01-28 ENCOUNTER — Telehealth: Payer: Self-pay | Admitting: Physician Assistant

## 2021-01-28 MED ORDER — APIXABAN 5 MG PO TABS: 5.0000 mg | ORAL_TABLET | Freq: Two times a day (BID) | ORAL | 5 refills | Status: DC

## 2021-01-28 MED FILL — Potassium Chloride Inj 2 mEq/ML: INTRAVENOUS | Qty: 40 | Status: AC

## 2021-01-28 MED FILL — Heparin Sodium (Porcine) Inj 1000 Unit/ML: Qty: 1000 | Status: AC

## 2021-01-28 MED FILL — Magnesium Sulfate Inj 50%: INTRAMUSCULAR | Qty: 10 | Status: AC

## 2021-01-28 NOTE — Telephone Encounter (Signed)
Spoke with pt's wife. She stated that pt was at work. She said she will notify pt to increase Eliquis to 5 mg twice a day. I notified her that it was okay for pt to double up on the 2.5 mg tablets until he finishes them. I will send a new prescription to pt's preferred pharmacy.

## 2021-01-28 NOTE — Telephone Encounter (Signed)
I agree with the dose increase. Retrospective cohort studies have found the Eliquis 5mg  BID dose, not the 2.5mg  dose to reduce thromboembolic and mortality risk vs warfarin. Since pt is >80 and greater than 60kg I think its very reasonable to increase to 5mg  BID

## 2021-01-28 NOTE — Addendum Note (Signed)
Addended by: Marcelle Overlie D on: 01/28/2021 01:08 PM   Modules accepted: Orders

## 2021-01-28 NOTE — Addendum Note (Signed)
Addended by: Mendel Ryder on: 01/28/2021 12:43 PM   Modules accepted: Orders

## 2021-01-28 NOTE — Telephone Encounter (Signed)
  HEART AND [place] VALVE TEAM  Spoke with pharmacy and Dr. Angelena Form and we feel he should be on the high dose of Eliquis 5mg  BID. He can finish his 2.5mg  tablets but double up. Bernerd Pho, can you call this in for him and update his med list? 90 day supply is okay if that is better for him.  Angelena Form PA-C  [facility]

## 2021-02-16 ENCOUNTER — Other Ambulatory Visit: Payer: Self-pay | Admitting: Family Medicine

## 2021-02-18 ENCOUNTER — Other Ambulatory Visit: Payer: Self-pay

## 2021-02-18 ENCOUNTER — Encounter: Payer: Self-pay | Admitting: Physician Assistant

## 2021-02-18 ENCOUNTER — Ambulatory Visit (INDEPENDENT_AMBULATORY_CARE_PROVIDER_SITE_OTHER): Payer: Managed Care, Other (non HMO) | Admitting: Physician Assistant

## 2021-02-18 ENCOUNTER — Other Ambulatory Visit: Payer: Self-pay | Admitting: Physician Assistant

## 2021-02-18 ENCOUNTER — Ambulatory Visit (HOSPITAL_COMMUNITY): Payer: Managed Care, Other (non HMO) | Attending: Internal Medicine

## 2021-02-18 VITALS — BP 130/80 | HR 88 | Ht 68.0 in | Wt 215.0 lb

## 2021-02-18 DIAGNOSIS — I2510 Atherosclerotic heart disease of native coronary artery without angina pectoris: Secondary | ICD-10-CM

## 2021-02-18 DIAGNOSIS — R918 Other nonspecific abnormal finding of lung field: Secondary | ICD-10-CM

## 2021-02-18 DIAGNOSIS — Z992 Dependence on renal dialysis: Secondary | ICD-10-CM

## 2021-02-18 DIAGNOSIS — Z952 Presence of prosthetic heart valve: Secondary | ICD-10-CM | POA: Diagnosis not present

## 2021-02-18 DIAGNOSIS — I10 Essential (primary) hypertension: Secondary | ICD-10-CM | POA: Diagnosis not present

## 2021-02-18 DIAGNOSIS — N186 End stage renal disease: Secondary | ICD-10-CM

## 2021-02-18 DIAGNOSIS — I350 Nonrheumatic aortic (valve) stenosis: Secondary | ICD-10-CM

## 2021-02-18 DIAGNOSIS — I4819 Other persistent atrial fibrillation: Secondary | ICD-10-CM

## 2021-02-18 DIAGNOSIS — N2889 Other specified disorders of kidney and ureter: Secondary | ICD-10-CM

## 2021-02-18 LAB — ECHOCARDIOGRAM COMPLETE
AV Mean grad: 18 mmHg
AV Peak grad: 24.4 mmHg
Ao pk vel: 2.47 m/s
S' Lateral: 2.8 cm

## 2021-02-18 NOTE — Progress Notes (Addendum)
HEART AND [place]                                     Cardiology Office Note:    Date:  02/19/2021   ID:  Daniel Solomon, DOB 12-26-1960, MRN [id number]  PCP:  Marin Olp, MD  [facility] Cardiologist:  Lauree Chandler, MD / Dr. Cyndia Bent, MD (TAVR)  [facility] Electrophysiologist:  None   Referring MD: Marin Olp, MD   1 month s/p TAVR  History of Present Illness:    Daniel Solomon is a 60 y.o. male with a hx of CAD, HTN, HLD, sinus bradycardia, sleep apnea, thoracic aortic aneurysm, persistent atrial fibrillation on Eliquis, ESRD on PD in the work up for renal transplantation and bicuspid aortic valve with moderate AS s/p TAVR (01/19/21) who presents to clinic for follow up.   Mr. Age was admitted 10/2010 with unstable angina and was found to have severe stenosis in the mid to distal LAD treated with a 2.25 x 18 mm Resolute DES. He was also found to have moderate mid LAD and distal RCA disease. Prior echocardiogram from 12/2015 showed an LVE at 55-60%, mild AS, mildly dilated aortic root. He previously wore an event monitor which showed atrial fibrillation and was started on Xarelto. Echo at [facility] 07/2020 showed moderate to severe AS with normal LV function. Images were not available for review however report was accessed in [place] by Dr. Angelena Form. Mean gradient at that time was up to 31.6 mmHg, AVA 0.8 cm2, dimensionless index 0.30. He was told that he could not have a kidney transplant at [facility] unless he had TAVR first.   He was evaluated by the multidisciplinary valve team and underwent successful TAVR with a 26 mm Edwards S3UR THV via the TF approach on 01/19/21. Post operative echo showed EF 60%, normally functioning TAVR with a mean gradient of 12 mmHg and no PVL. He was resumed on home renally dosed Eliquis.  At follow up he was going well aside from a gout flare. This was treated with  colchicine. Eliquis was increased to 5mg  daily (as this is the correct dosing: <80 yo, weight >60kg.)  Today the patient presents to clinic for follow up. No CP or SOB. No LE edema, orthopnea or PND. No dizziness or syncope. No blood in stool or urine. No palpitations.    Past Medical History:  Diagnosis Date   Anemia    Bicuspid aortic valve    CAD (coronary artery disease)    proximal LAD 50%, mid LAD 50-60%, mid to distal LAD 99%; mid circumflex 20%; proximal RCA 20%, distal RCA 50%; PDA 60-70%, proximal posterior AV groove 50%, proximal posterior lateral 50%.  There was a question of possible LVOT gradient;  s/p Resolute DES to mid-distal LAD 10/2010;  echocardiogram 11/17/10: EF 55-60%, mild LVH, grade 1 diastolic dysfunction, normal aortic valve, mild MR, PASP 32.    Chicken pox    Enlarged aorta ([name])    ESRD on peritoneal dialysis ([name])    Glaucoma    Gout    allopurinol 300mg , colchicine prn in past, has not had flares   Hypercholesterolemia    With hypertriglyceridemia   Hypertension    Kidney stones    hx hematuria as result. cystosopy normal   Mild aortic stenosis    PAF (paroxysmal atrial fibrillation) (  [place])    a. Documented on event monitor 01/2016.   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   Sleep apnea    cpap nightly    Past Surgical History:  Procedure Laterality Date   CORONARY STENT PLACEMENT  2012   CYSTOSCOPY/RETROGRADE/URETEROSCOPY  03/26/2012   Procedure: CYSTOSCOPY/RETROGRADE/URETEROSCOPY;  Surgeon: [name], MD;  Location: [facility];  Service: Urology;  Laterality: Bilateral;  CYSTOSCOPY BILATERAL RETROGRADE PYELOGRAM AND POSSIBLE URETEROSCOPY   INTRAOPERATIVE TRANSTHORACIC ECHOCARDIOGRAM N/A 01/19/2021   Procedure: INTRAOPERATIVE TRANSTHORACIC ECHOCARDIOGRAM;  Surgeon: Burnell Blanks,  MD;  Location: [place];  Service: Open Heart Surgery;  Laterality: N/A;   RIGHT HEART CATH AND CORONARY ANGIOGRAPHY N/A 12/04/2020   Procedure: RIGHT HEART CATH AND CORONARY ANGIOGRAPHY;  Surgeon: Burnell Blanks, MD;  Location: [place] [facility];  Service: Cardiovascular;  Laterality: N/A;   TRANSCATHETER AORTIC VALVE REPLACEMENT, TRANSFEMORAL N/A 01/19/2021   Procedure: TRANSCATHETER AORTIC VALVE REPLACEMENT, TRANSFEMORAL USING A 26MM EDWARDS VALVE.;  Surgeon: Burnell Blanks, MD;  Location: [place];  Service: Open Heart Surgery;  Laterality: N/A;    Current Medications: Current Meds  Medication Sig   allopurinol (ZYLOPRIM) 300 MG tablet TAKE 1 TABLET BY MOUTH EVERY DAY   amoxicillin (AMOXIL) 500 MG tablet Take 4 tablets by mouth 1 hour prior to dental appointments and cleanings   apixaban (ELIQUIS) 5 MG TABS tablet Take 1 tablet (5 mg total) by mouth 2 (two) times daily.   atorvastatin (LIPITOR) 40 MG tablet TAKE 1 TABLET BY MOUTH EVERY DAY   AURYXIA 1 GM 210 MG(Fe) tablet Take 420 mg by mouth 3 (three) times daily.   calcitRIOL (ROCALTROL) 0.25 MCG capsule Take 0.25 mcg by mouth daily.   colchicine 0.6 MG tablet Take 1 tablet (0.6 mg total) by mouth daily.   docusate sodium (COLACE) 100 MG capsule Take 100 mg by mouth daily.   gentamicin ointment (GARAMYCIN) 0.1 % Apply 1 application topically daily.   iron sucrose in sodium chloride 0.9 % 100 mL Iron Sucrose (Venofer)   labetalol (NORMODYNE) 100 MG tablet Take 100 mg by mouth 2 (two) times daily.   Methoxy PEG-Epoetin Beta (MIRCERA IJ) Inject into the skin as needed.   torsemide (DEMADEX) 100 MG tablet Take 100 mg by mouth daily.     Allergies:   Patient has no known allergies.   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 There is no history of Colon cancer.  ROS:   Please see the history of present illness.    All other systems reviewed and are negative.  EKGs/Labs/Other Studies Reviewed:    The following studies were reviewed today:  TAVR OPERATIVE NOTE     Date of Procedure:                01/19/2021   Preoperative Diagnosis:      Severe Aortic Stenosis    Postoperative Diagnosis:    Same    Procedure:        Transcatheter Aortic Valve Replacement - Percutaneous Right Transfemoral Approach             Edwards Sapien 3 Ultra RSLTHV (size 26 mm, model # 9755RSL, serial # [id number])              Co-Surgeons:                        Gaye Pollack, MD and Lauree Chandler, MD     Anesthesiologist:                  Wilfrid Lund, MD   Echocardiographer:              Edmonia James, MD   Pre-operative Echo Findings: Severe aortic stenosis Normal left ventricular systolic function   Post-operative Echo Findings: No paravalvular  leak Normal left ventricular systolic function  _____________   Echo 01/20/21:  IMPRESSIONS   1. Left ventricular ejection fraction, by estimation, is 60 to 65%. The  left ventricle has normal function. The left ventricle has no regional  wall motion abnormalities. There is mild left ventricular hypertrophy.  Left ventricular diastolic parameters  are indeterminate.   2. Right ventricular systolic function is normal. The right ventricular  size is normal.   3. Left atrial size was moderately dilated.   4. Right atrial size was mildly dilated.   5. The mitral valve is abnormal. Mild mitral valve regurgitation. No  evidence of mitral stenosis.   6. Post TAVR with 26 mm Sapien 3 valve Deployment somewhat high but  leaflets appear covered on limited views and No PvL mean gradient 12 peak  22 mmHg AVA 2.7 cm2. The aortic valve has been repaired/replaced. Aortic  valve regurgitation is not visualized.   No aortic stenosis is present. There is a 26 mm Sapien prosthetic (TAVR)  valve present in the aortic position. Procedure Date: 01/19/2021.   7. Aortic dilatation noted. There is mild dilatation of the ascending  aorta, measuring 41 mm.   8. The inferior vena cava is normal in size with greater than 50%  respiratory variability, suggesting right atrial pressure of 3 mmHg.   _____________________  Echo 02/18/21 IMPRESSIONS   1. DVI 0.45. Peak gradient 27 mmHg. EOA 1.83 cm2. The aortic valve has  been replaced by a 26 mm Sapien valve. There is trivial PVL only seen in  the PLAX view. Aortic valve mean gradient measures 18.0 mmHg. Aortic valve  Vmax measures 2.47 m/s. Aortic  valve acceleration time measures 67 msec.   2. Left ventricular ejection fraction, by estimation, is 60 to 65%. The  left ventricle has normal function. The left ventricle has no regional  wall motion abnormalities. There is mild concentric left ventricular  hypertrophy. Left ventricular diastolic  parameters are  indeterminate.   3. Right ventricular systolic function is normal. The right ventricular  size is normal. There is normal pulmonary artery systolic pressure.   4. Left atrial size was severely dilated.   5. The mitral valve is abnormal. Trivial mitral valve regurgitation. No  evidence of mitral stenosis.   6. Aortic dilatation noted. There is mild dilatation of the ascending  aorta, measuring 41 mm.   7. The inferior vena cava is normal in size with greater than 50%  respiratory variability, suggesting right atrial pressure of 3 mmHg.   Comparison(s): In the setting of atrial fibrillation and beat to beat  variation, likely prosthetic gradients from prior.   EKG:  EKG is NOT ordered today.    Recent Labs: 01/15/2021: ALT 21; B Natriuretic Peptide 123.8 01/20/2021: BUN 85; Creatinine, Ser 13.93; Hemoglobin 9.5; Platelets 81; Potassium 4.6; Sodium 139  Recent Lipid Panel    Component Value Date/Time   CHOL 95 (L) 08/28/2020 0737   TRIG 155 (H) 08/28/2020 0737   HDL 30 (L) 08/28/2020 0737   CHOLHDL 3.2 08/28/2020 0737   CHOLHDL 3 10/24/2018 1014   VLDL 37.4 10/24/2018 1014   LDLCALC 39 08/28/2020 0737   LDLDIRECT 70.9 02/10/2012 0737     Risk Assessment/Calculations:    CHA2DS2-VASc Score =     This indicates a  % annual risk of stroke. The patient's score is based upon:        Physical Exam:    VS:  BP 130/80    Pulse 88    Ht 5\' 8"  (1.727 m)    Wt 215 lb (97.5 kg)    SpO2 99%    BMI 32.69 kg/m     Wt Readings from Last 3 Encounters:  02/18/21 215 lb (97.5 kg)  01/27/21 218 lb (98.9 kg)  01/20/21 210 lb 15.7 oz (95.7 kg)     GEN:  Well nourished, well developed in no acute distress HEENT: Normal NECK: No JVD LYMPHATICS: No lymphadenopathy CARDIAC: RRR, soft flow murmur. no rubs, gallops RESPIRATORY:  Clear to auscultation without rales, wheezing or rhonchi  ABDOMEN: Soft, non-tender, non-distended MUSCULOSKELETAL:  No edema; No deformity  SKIN: Warm and  dry NEUROLOGIC:  Alert and oriented x 3 PSYCHIATRIC:  Normal affect   ASSESSMENT:    1. S/P TAVR (transcatheter aortic valve replacement)   2. Essential hypertension   3. Persistent atrial fibrillation ([place])   4. Coronary artery disease involving native coronary artery of native heart without angina pectoris   5. ESRD on peritoneal dialysis ([place])   6. Renal mass   7. Pulmonary nodules     PLAN:    In order of problems listed above:  Severe AS s/p TAVR: echo today shows EF 60%, normally functioning TAVR with a mean gradient of 18 mm hg and trivial PVL. He has NYHA class I symptoms. Continue on Eliquis alone. SBE prophylaxis discussed; I have RX'd amoxicillin. I will see him back in one year for follow up with echo.  HTN: BP stable with no changes needed. Continue current therapy  Atrial fibrillation, persistent: now on appropriate dosed Eliquis 5mg  BID (<80 yo, weight >60kg.)   CAD:  Denies anginal symptoms. No beta blocker with bradycardia. Continue statin. No ASA since he is on Eliquis  ESRD on PD: he is hoping to proceed with renal transplant in the near future. He is cleared from a cardiac standpoint to proceed with this.    Renal mass: pre TAVR CTs showed an enlarging exophytic intermediate attenuation lesion in the lower pole of the left kidney. Although does may simply represent a proteinaceous/hemorrhagic cysts, further characterization with nonemergent MRI of the abdomen with and without IV gadolinium is recommended in the near future to better characterize this finding and exclude the possibility of a small renal neoplasm. Renal mass that was seen previously on MRI and felt to be a cyst. Urology will not sign off. We offered to repeat MRI but cannot get after 1/25 due to iron infusion on 10/25. This has been set up for January after 3 months out from this last infusion  Pulmonary nodules: pre TAVR CT showed pulmonary nodules. If pt high risk, consider CT in 12 mo. Patient does  not appear to have any high risk features. No follow up recommended.   Medication Adjustments/Labs and Tests Ordered: Current medicines are reviewed at length with the patient today.  Concerns regarding medicines are outlined above.  No orders of the defined types were placed in this encounter.  No orders of the defined types were placed in this encounter.   Patient Instructions  Medication Instructions:  Your physician recommends that you continue on your current medications as directed. Please refer to the Current Medication list given to you today.  *If you need a refill on your cardiac medications before your next appointment, please call your pharmacy*  Follow-Up: AS SCHEDULED      Signed, Angelena Form, PA-C  02/19/2021 4:02 PM    [facility]

## 2021-02-18 NOTE — Patient Instructions (Signed)
Medication Instructions:  Your physician recommends that you continue on your current medications as directed. Please refer to the Current Medication list given to you today.  *If you need a refill on your cardiac medications before your next appointment, please call your pharmacy*  Follow-Up: AS SCHEDULED

## 2021-03-02 ENCOUNTER — Telehealth: Payer: Self-pay | Admitting: Physician Assistant

## 2021-03-26 ENCOUNTER — Ambulatory Visit (HOSPITAL_COMMUNITY)
Admission: RE | Admit: 2021-03-26 | Discharge: 2021-03-26 | Disposition: A | Discharge status: Home / Self Care | Payer: Managed Care, Other (non HMO) | Source: Ambulatory Visit | Attending: Cardiovascular Disease | Admitting: Cardiovascular Disease

## 2021-03-26 ENCOUNTER — Other Ambulatory Visit: Payer: Self-pay

## 2021-03-26 ENCOUNTER — Other Ambulatory Visit: Payer: Self-pay | Admitting: Physician Assistant

## 2021-03-26 DIAGNOSIS — R9389 Abnormal findings on diagnostic imaging of other specified body structures: Secondary | ICD-10-CM | POA: Insufficient documentation

## 2021-03-26 DIAGNOSIS — N289 Disorder of kidney and ureter, unspecified: Secondary | ICD-10-CM | POA: Insufficient documentation

## 2021-03-26 DIAGNOSIS — N2889 Other specified disorders of kidney and ureter: Secondary | ICD-10-CM | POA: Diagnosis present

## 2021-03-26 MED ORDER — GADOBUTROL 1 MMOL/ML IV SOLN
9.0000 mL | Freq: Once | INTRAVENOUS | Status: AC | PRN
  Administered 2021-03-26: 9 mL via INTRAVENOUS

## 2021-04-19 ENCOUNTER — Other Ambulatory Visit: Payer: Self-pay | Admitting: Family Medicine

## 2021-06-27 ENCOUNTER — Other Ambulatory Visit: Payer: Self-pay | Admitting: Family Medicine

## 2021-07-08 ENCOUNTER — Other Ambulatory Visit: Payer: Self-pay | Admitting: Cardiovascular Disease

## 2021-07-08 NOTE — Telephone Encounter (Signed)
Eliquis '5mg'$  refill request received. Patient is 61 years old, weight-97.5kg, Crea-13.93 on 01/20/2021-ESRD & on PD, undergoing workup for renal transplant, Diagnosis-Afib, and last seen by Bonney Leitz on 02/18/2021-per note Cardiology is aware of creatinine levels. Dose is appropriate based on dosing criteria. Will send in refill to requested pharmacy.   ? ?Discussed Creatinine level with Lenna Sciara, Pharmacist  ?

## 2021-08-25 ENCOUNTER — Ambulatory Visit (INDEPENDENT_AMBULATORY_CARE_PROVIDER_SITE_OTHER): Payer: Managed Care, Other (non HMO) | Admitting: Cardiovascular Disease

## 2021-08-25 ENCOUNTER — Encounter: Payer: Self-pay | Admitting: Cardiovascular Disease

## 2021-08-25 VITALS — BP 132/74 | HR 83 | Ht 68.0 in | Wt 229.4 lb

## 2021-08-25 DIAGNOSIS — I4819 Other persistent atrial fibrillation: Secondary | ICD-10-CM | POA: Diagnosis not present

## 2021-08-25 DIAGNOSIS — I2510 Atherosclerotic heart disease of native coronary artery without angina pectoris: Secondary | ICD-10-CM | POA: Diagnosis not present

## 2021-08-25 DIAGNOSIS — I350 Nonrheumatic aortic (valve) stenosis: Secondary | ICD-10-CM

## 2021-08-25 DIAGNOSIS — I10 Essential (primary) hypertension: Secondary | ICD-10-CM

## 2021-08-25 MED ORDER — TORSEMIDE 100 MG PO TABS: 100.0000 mg | ORAL_TABLET | Freq: Every day | ORAL | 3 refills | Status: DC

## 2021-08-25 NOTE — Patient Instructions (Signed)
Medication Instructions:  Your physician recommends that you continue on your current medications as directed. Please refer to the Current Medication list given to you today.  *If you need a refill on your cardiac medications before your next appointment, please call your pharmacy*   Follow-Up: At [facility], you and your health needs are our priority.  As part of our continuing mission to provide you with exceptional heart care, we have created designated Provider Care Teams.  These Care Teams include your primary Cardiologist (physician) and Advanced Practice Providers (APPs -  Physician Assistants and Nurse Practitioners) who all work together to provide you with the care you need, when you need it.   Your next appointment:   1 year(s)  The format for your next appointment:   In Person  Provider:   Lauree Chandler, MD {    Important Information About Sugar

## 2021-08-25 NOTE — Progress Notes (Signed)
Chief Complaint  Patient presents with   Follow-up    Aortic stenosis, CAD   History of Present Illness: 61 yo male with history of ESRD on PD, CAD, HTN, HLD, sinus bradycardia, bicuspid aortic valve with severe AS s/p TAVR,  sleep apnea, thoracic aortic aneurysm, persistent atrial fibrillation here today for cardiac follow up. He was admitted with unstable angina in September 2012 and was found to have severe stenosis in the mid to distal LAD treated with a 2.25 x 18 mm Resolute DES. He was also found to have moderate mid LAD and distal RCA disease. In December 2017 he was found to have a branch retinal artery occlusion in his right eye. He was in atrial fibrillation at time of office visit here in January 2018. He was started on Xarelto January 2018. He was followed for bicuspid aortic stenosis. This progressed to severe AS and he underwent TAVR in November 2022. Cardiac cath October 2022 with patent LAD stent, moderate diffuse mid LAD stenosis, moderate distal stenosis.   He is here today for follow up. The patient denies any chest pain, dyspnea, palpitations, lower extremity edema, orthopnea, PND, dizziness, near syncope or syncope.   Primary Care Physician: Marin Olp, MD  Past Medical History:  Diagnosis Date   Anemia    Bicuspid aortic valve    CAD (coronary artery disease)    proximal LAD 50%, mid LAD 50-60%, mid to distal LAD 99%; mid circumflex 20%; proximal RCA 20%, distal RCA 50%; PDA 60-70%, proximal posterior AV groove 50%, proximal posterior lateral 50%.  There was a question of possible LVOT gradient;  s/p Resolute DES to mid-distal LAD 10/2010;  echocardiogram 11/17/10: EF 55-60%, mild LVH, grade 1 diastolic dysfunction, normal aortic valve, mild MR, PASP 32.    Chicken pox    Enlarged aorta ([name])    ESRD on peritoneal dialysis ([name])    Glaucoma    Gout    allopurinol '300mg'$ , colchicine prn in past, has not had flares   Hypercholesterolemia    With  hypertriglyceridemia   Hypertension    Kidney stones    hx hematuria as result. cystosopy normal   Mild aortic stenosis    PAF (paroxysmal atrial fibrillation) ([place])    a. Documented on event monitor 01/2016.   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   Sleep apnea    cpap nightly    Past Surgical History:  Procedure Laterality Date   CORONARY STENT PLACEMENT  2012   CYSTOSCOPY/RETROGRADE/URETEROSCOPY  03/26/2012   Procedure: CYSTOSCOPY/RETROGRADE/URETEROSCOPY;  Surgeon: [name], MD;  Location[facility];  Service: Urology;  Laterality: Bilateral;  CYSTOSCOPY BILATERAL RETROGRADE PYELOGRAM AND POSSIBLE URETEROSCOPY   INTRAOPERATIVE TRANSTHORACIC ECHOCARDIOGRAM N/A 01/19/2021   Procedure: INTRAOPERATIVE TRANSTHORACIC ECHOCARDIOGRAM;  Surgeon: Burnell Blanks, MD;  Location: [place];  Service: Open Heart Surgery;  Laterality: N/A;   RIGHT HEART CATH AND CORONARY ANGIOGRAPHY N/A 12/04/2020   Procedure: RIGHT HEART CATH AND CORONARY ANGIOGRAPHY;  Surgeon: Burnell Blanks, MD;  Location: [place] [facility];  Service: Cardiovascular;  Laterality: N/A;   TRANSCATHETER AORTIC VALVE REPLACEMENT, TRANSFEMORAL N/A 01/19/2021   Procedure: TRANSCATHETER AORTIC VALVE REPLACEMENT, TRANSFEMORAL USING A 26MM EDWARDS VALVE.;  Surgeon: Burnell Blanks, MD;  Location: [place];  Service: Open Heart Surgery;  Laterality: N/A;    Current Outpatient  Medications  Medication Sig Dispense Refill   allopurinol (ZYLOPRIM) 300 MG tablet TAKE 1 TABLET BY MOUTH EVERY DAY 90 tablet 2   amoxicillin (AMOXIL) 500 MG tablet Take 4 tablets by mouth 1 hour prior to dental appointments and cleanings 12 tablet 6   atorvastatin (LIPITOR) 40 MG tablet TAKE 1 TABLET BY MOUTH EVERY DAY 90 tablet 2   AURYXIA 1 GM 210 MG(Fe) tablet Take 420 mg by mouth  3 (three) times daily.     calcitRIOL (ROCALTROL) 0.25 MCG capsule Take 0.25 mcg by mouth daily.     colchicine 0.6 MG tablet Take 1 tablet (0.6 mg total) by mouth daily. 1 tablet 0   docusate sodium (COLACE) 100 MG capsule Take 100 mg by mouth daily.     ELIQUIS 5 MG TABS tablet TAKE 1 TABLET BY MOUTH TWICE A DAY 60 tablet 5   gentamicin ointment (GARAMYCIN) 0.1 % Apply 1 application topically daily.     iron sucrose in sodium chloride 0.9 % 100 mL Iron Sucrose (Venofer)     labetalol (NORMODYNE) 100 MG tablet Take 100 mg by mouth 2 (two) times daily.     Methoxy PEG-Epoetin Beta (MIRCERA IJ) Inject into the skin as needed.     torsemide (DEMADEX) 100 MG tablet Take 1 tablet (100 mg total) by mouth daily. 90 tablet 3   No current facility-administered medications for this visit.    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)   BS at [employer]: reading, church activities- [employer]   Social Determinants of Health   Financial Resource Strain: Not on file  Food Insecurity: Not on file  Transportation Needs: Not on file  Physical Activity: Not on file  Stress: Not on file  Social Connections: Not on file  Intimate Partner Violence: Not on file    Family History  Problem Relation Age of Onset    Hypertension Father    CAD Father        CABG 64s   Kidney disease Father        Stage IV   Valvular heart disease Mother        AVR July 2015   Diabetes Paternal Grandmother    Colon cancer Neg Hx     Review of Systems:  As stated in the HPI and otherwise negative.   BP 132/74   Pulse 83   Ht '5\' 8"'$  (1.727 m)   Wt 229 lb 6.4 oz (104.1 kg)   SpO2 98%   BMI 34.88 kg/m   Physical Examination: General: Well developed, well nourished, NAD  HEENT: OP clear, mucus  membranes moist  SKIN: warm, dry. No rashes. Neuro: No focal deficits  Musculoskeletal: Muscle strength 5/5 all ext  Psychiatric: Mood and affect normal  Neck: No JVD, no carotid bruits, no thyromegaly, no lymphadenopathy.  Lungs:Clear bilaterally, no wheezes, rhonci, crackles Cardiovascular: Regular rate and rhythm. Soft systolic murmur.  Abdomen:Soft. Bowel sounds present. Non-tender.  Extremities: No lower extremity edema. Pulses are 2 + in the bilateral DP/PT.  Echo 02/18/21:  1. DVI 0.45. Peak gradient 27 mmHg. EOA 1.83 cm2. The aortic valve has  been replaced by a 26 mm Sapien valve. There is trivial PVL only seen in  the PLAX view. Aortic valve mean gradient measures 18.0 mmHg. Aortic valve  Vmax measures 2.47 m/s. Aortic  valve acceleration time measures 67 msec.   2. Left ventricular ejection fraction, by estimation, is 60 to 65%. The  left ventricle has normal function. The left ventricle has no regional  wall motion abnormalities. There is mild concentric left ventricular  hypertrophy. Left ventricular diastolic  parameters are indeterminate.   3. Right ventricular systolic function is normal. The right ventricular  size is normal. There is normal pulmonary artery systolic pressure.   4. Left atrial size was severely dilated.   5. The mitral valve is abnormal. Trivial mitral valve regurgitation. No  evidence of mitral stenosis.   6. Aortic dilatation noted. There is mild dilatation of the ascending   aorta, measuring 41 mm.   7. The inferior vena cava is normal in size with greater than 50%  respiratory variability, suggesting right atrial pressure of 3 mmHg.   EKG:  EKG is ordered today. The ekg ordered today demonstrates   Recent Labs: 01/15/2021: ALT 21; B Natriuretic Peptide 123.8 01/20/2021: BUN 85; Creatinine, Ser 13.93; Hemoglobin 9.5; Platelets 81; Potassium 4.6; Sodium 139   Lipid Panel    Component Value Date/Time   CHOL 95 (L) 08/28/2020 0737   TRIG 155 (H) 08/28/2020 0737   HDL 30 (L) 08/28/2020 0737   CHOLHDL 3.2 08/28/2020 0737   CHOLHDL 3 10/24/2018 1014   VLDL 37.4 10/24/2018 1014   LDLCALC 39 08/28/2020 0737   LDLDIRECT 70.9 02/10/2012 0737     Wt Readings from Last 3 Encounters:  08/25/21 229 lb 6.4 oz (104.1 kg)  02/18/21 215 lb (97.5 kg)  01/27/21 218 lb (98.9 kg)     Assessment and Plan:   1.Severe AS s/p TAVR: Doing well. Continue Eliquis. SBE prophylaxis as needed.   2. HTN: BP is controlled. No changes  3. HLD: LDL 39 in July 2022. Continue statin  4. Atrial fibrillation, persistent: He is in atrial fib today.with good rate control. Continue Eliquis 5 mg po BID (This was reviewed with pharmacy and this is the correct dose).   5. CAD without angina: No chest pain. Continue statin and beta blocker. No ASA since he is on Eliquis.   6. Carotid artery disease: He is known to have mild bilateral carotid disease  Current medicines are reviewed at length with the patient today.  The patient does not have concerns regarding medicines.  The following changes have been made:  no change  Labs/ tests ordered today include:   No orders of the defined types were placed in this encounter.  Disposition:   F/U with me one year.    Signed, Lauree Chandler, MD 08/25/2021 9:36 AM    [facility] [address], [place], [place]  [address] Phone: [phone]; Fax: [phone]

## 2021-08-28 DIAGNOSIS — S065XAA Traumatic subdural hemorrhage with loss of consciousness status unknown, initial encounter: Secondary | ICD-10-CM

## 2021-08-28 HISTORY — DX: Traumatic subdural hemorrhage with loss of consciousness status unknown, initial encounter: S06.5XAA

## 2021-09-06 ENCOUNTER — Ambulatory Visit (INDEPENDENT_AMBULATORY_CARE_PROVIDER_SITE_OTHER): Payer: Managed Care, Other (non HMO) | Admitting: Family Medicine

## 2021-09-06 ENCOUNTER — Encounter: Payer: Self-pay | Admitting: Family Medicine

## 2021-09-06 VITALS — BP 124/70 | HR 83 | Temp 98.3°F | Ht 68.0 in | Wt 219.4 lb

## 2021-09-06 DIAGNOSIS — G4489 Other headache syndrome: Secondary | ICD-10-CM | POA: Diagnosis not present

## 2021-09-06 MED ORDER — HYDROCODONE-ACETAMINOPHEN 5-325 MG PO TABS: ORAL_TABLET | ORAL | 0 refills | Status: DC

## 2021-09-06 NOTE — Progress Notes (Signed)
OFFICE VISIT  09/06/2021  CC:  Chief Complaint  Patient presents with   Migraine    Severe; has used Tylenol otc but not helping. Last dose 12:45pm. Pt c/o some nausea as well. Started 2 days ago; exact time 4am   Patient is a 61 y.o. male who presents for headache.  HPI: Reports acute onset of headache that awakened him from sleep at 4 AM---> a little over 2 days ago. Initial intensity was 10 out of 10 but this gradually went down to a 5 out of 10 and he states currently it is a 1-2 out of 10 intensity.  Pain is localized to the left frontal region above the forehead.  Does not radiate.  Mostly throbbing in nature but sometimes sharp.  Some intermittent nausea but no vomiting.  No photo or phonophobia. Some mild intermittent dizziness.  No visual abnormalities, focal weakness, no tremors, no tinnitus, no paresthesias. Tylenol of questionable benefit.  He denies any history of any problems with headaches. The only change in medications recently is he has done a ween of his gabapentin, which he states he was not on for very long.  He is on daily peritoneal dialysis, has already dialyzed today.  ROS as above, plus--> no fevers, no CP, no SOB, no wheezing, no cough, no rashes, no melena/hematochezia.  No polyuria or polydipsia.  No myalgias or arthralgias.  No acute vision or hearing abnormalities.  No dysuria or unusual/new urinary urgency or frequency.  No recent changes in lower legs. No n/v/d or abd pain.  No palpitations.    Past Medical History:  Diagnosis Date   Anemia    Bicuspid aortic valve    CAD (coronary artery disease)    proximal LAD 50%, mid LAD 50-60%, mid to distal LAD 99%; mid circumflex 20%; proximal RCA 20%, distal RCA 50%; PDA 60-70%, proximal posterior AV groove 50%, proximal posterior lateral 50%.  There was a question of possible LVOT gradient;  s/p Resolute DES to mid-distal LAD 10/2010;  echocardiogram 11/17/10: EF 55-60%, mild LVH, grade 1 diastolic dysfunction,  normal aortic valve, mild MR, PASP 32.    Chicken pox    Enlarged aorta ([name])    ESRD on peritoneal dialysis ([name])    Glaucoma    Gout    allopurinol '300mg'$ , colchicine prn in past, has not had flares   Hypercholesterolemia    With hypertriglyceridemia   Hypertension    Kidney stones    hx hematuria as result. cystosopy normal   Mild aortic stenosis    PAF (paroxysmal atrial fibrillation) ([place])    a. Documented on event monitor 01/2016.   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   Sleep apnea    cpap nightly    Past Surgical History:  Procedure Laterality Date   CORONARY STENT PLACEMENT  2012   CYSTOSCOPY/RETROGRADE/URETEROSCOPY  03/26/2012   Procedure: CYSTOSCOPY/RETROGRADE/URETEROSCOPY;  Surgeon: [name], MD;  Location: [facility];  Service: Urology;  Laterality: Bilateral;  CYSTOSCOPY BILATERAL RETROGRADE PYELOGRAM AND POSSIBLE URETEROSCOPY   INTRAOPERATIVE TRANSTHORACIC ECHOCARDIOGRAM N/A 01/19/2021   Procedure: INTRAOPERATIVE TRANSTHORACIC ECHOCARDIOGRAM;  Surgeon: Burnell Blanks, MD;  Location: [place];  Service: Open Heart Surgery;  Laterality: N/A;   RIGHT HEART CATH AND CORONARY ANGIOGRAPHY N/A 12/04/2020   Procedure: RIGHT HEART CATH AND CORONARY ANGIOGRAPHY;  Surgeon: Burnell Blanks, MD;  Location: [place] [facility];  Service: Cardiovascular;  Laterality: N/A;   TRANSCATHETER AORTIC VALVE REPLACEMENT, TRANSFEMORAL N/A 01/19/2021   Procedure: TRANSCATHETER AORTIC VALVE REPLACEMENT, TRANSFEMORAL USING A 26MM EDWARDS VALVE.;  Surgeon: Burnell Blanks, MD;  Location: [place];  Service: Open Heart Surgery;  Laterality: N/A;    Outpatient Medications Prior to Visit  Medication Sig Dispense Refill   allopurinol (ZYLOPRIM) 300 MG tablet TAKE 1 TABLET BY MOUTH EVERY DAY 90 tablet 2   amoxicillin  (AMOXIL) 500 MG tablet Take 4 tablets by mouth 1 hour prior to dental appointments and cleanings 12 tablet 6   atorvastatin (LIPITOR) 40 MG tablet TAKE 1 TABLET BY MOUTH EVERY DAY 90 tablet 2   AURYXIA 1 GM 210 MG(Fe) tablet Take 420 mg by mouth 3 (three) times daily.     calcitRIOL (ROCALTROL) 0.25 MCG capsule Take 0.25 mcg by mouth daily.     colchicine 0.6 MG tablet Take 1 tablet (0.6 mg total) by mouth daily. 1 tablet 0   docusate sodium (COLACE) 100 MG capsule Take 100 mg by mouth daily.     ELIQUIS 5 MG TABS tablet TAKE 1 TABLET BY MOUTH TWICE A DAY 60 tablet 5   gentamicin ointment (GARAMYCIN) 0.1 % Apply 1 application topically daily.     iron sucrose in sodium chloride 0.9 % 100 mL Iron Sucrose (Venofer)     labetalol (NORMODYNE) 100 MG tablet Take 100 mg by mouth 2 (two) times daily.     Methoxy PEG-Epoetin Beta (MIRCERA IJ) Inject into the skin as needed.     torsemide (DEMADEX) 100 MG tablet Take 1 tablet (100 mg total) by mouth daily. 90 tablet 3   No facility-administered medications prior to visit.    No Known Allergies  ROS As per HPI  PE:    09/06/2021    4:19 PM 08/25/2021    9:08 AM 02/18/2021    3:50 PM  Vitals with BMI  Height '5\' 8"'$  '5\' 8"'$  '5\' 8"'$   Weight 219 lbs 6 oz 229 lbs 6 oz 215 lbs  BMI 33.37 02.77 41.2  Systolic 878 676 720  Diastolic 70 74 80  Pulse 83 83 88     Physical Exam  Gen: Alert, well appearing.  Patient is oriented to person, place, time, and situation. AFFECT: pleasant, lucid thought and speech. NOB:SJGG: no injection, icteris, swelling, or exudate.  EOMI, PERRLA. Mouth: lips without lesion/swelling.  Oral mucosa pink and moist. Oropharynx without erythema, exudate, or swelling.  Neuro: CN 2-12 intact bilaterally, strength 5/5 in proximal and distal upper extremities and lower extremities bilaterally.  No sensory deficits.  No tremor.  No disdiadochokinesis.  No ataxia.  Upper extremity and lower extremity DTRs symmetric.  No pronator  drift.  LABS:  Last CBC Lab Results  Component Value Date   WBC 9.5 01/20/2021   HGB 9.5 (L) 01/20/2021   HCT 28.1 (L) 01/20/2021   MCV 104.1 (H) 01/20/2021   MCH 35.2 (H) 01/20/2021   RDW 13.3 01/20/2021   PLT 81 (L) 83/66/2947   Last metabolic panel Lab Results  Component Value Date   GLUCOSE 105 (H) 01/20/2021   NA 139 01/20/2021   K 4.6 01/20/2021   CL 104 01/20/2021   CO2 21 (L) 01/20/2021   BUN 85 (H) 01/20/2021   CREATININE 13.93 (H) 01/20/2021   GFRNONAA 4 (L) 01/20/2021   CALCIUM 8.6 (L) 01/20/2021   PROT 6.2 (L) 01/15/2021   ALBUMIN 3.2 (L) 01/15/2021  BILITOT 0.7 01/15/2021   ALKPHOS 57 01/15/2021   AST 12 (L) 01/15/2021   ALT 21 01/15/2021   ANIONGAP 14 01/20/2021   Last hemoglobin A1c Lab Results  Component Value Date   HGBA1C 5.1 01/12/2016   IMPRESSION AND PLAN:  #1 acute unilateral headache. Unknown etiology.  No past history of headache problem. Unlikely any acute intracranial cause given the fact that this has gradually improved over time and he has no symptoms or signs of acute neurologic abnormality. Holding off on triptan due to his history of cardiac issues. NSAIDs contraindicated due to his renal failure. We will do trial of Vicodin 5-3 25, 1-2 3 times daily as needed, #10.  Therapeutic expectations and side effect profile of medication discussed today.  Patient's questions answered.  An After Visit Summary was printed and given to the patient.  FOLLOW UP: Return if symptoms worsen or fail to improve in 24-48h.  Signed:  Crissie Sickles, MD           09/06/2021

## 2021-09-07 ENCOUNTER — Telehealth: Payer: Self-pay

## 2021-09-07 MED ORDER — HYDROCODONE-ACETAMINOPHEN 5-325 MG PO TABS: ORAL_TABLET | ORAL | 0 refills | Status: DC

## 2021-09-07 NOTE — Telephone Encounter (Signed)
Spoke with patient regarding results/recommendations.  

## 2021-09-07 NOTE — Telephone Encounter (Signed)
Rx did not go through, please resend to pharmacy.

## 2021-09-07 NOTE — Telephone Encounter (Signed)
Ok vicodin rx sent again

## 2021-09-09 ENCOUNTER — Encounter: Payer: Self-pay | Admitting: Family

## 2021-09-09 ENCOUNTER — Ambulatory Visit (INDEPENDENT_AMBULATORY_CARE_PROVIDER_SITE_OTHER): Payer: Managed Care, Other (non HMO) | Admitting: Family

## 2021-09-09 VITALS — BP 126/82 | HR 86 | Temp 97.6°F | Ht 68.0 in | Wt 212.5 lb

## 2021-09-09 DIAGNOSIS — R519 Headache, unspecified: Secondary | ICD-10-CM | POA: Diagnosis not present

## 2021-09-09 MED ORDER — METHOCARBAMOL 500 MG PO TABS: 1000.0000 mg | ORAL_TABLET | Freq: Four times a day (QID) | ORAL | 0 refills | Status: DC | PRN

## 2021-09-09 NOTE — Progress Notes (Signed)
Patient ID: CASANOVA SCHURMAN, male    DOB: 1960-07-25, 61 y.o.   MRN: [id number]  Chief Complaint  Patient presents with   Migraine    Pt c/o severe migraines and nausea since Saturday( left front side of head). Has tried tylenol and hydrocodone which did help somewhat. Pt states when migraine occurs it feels like a sharp pain behind his eyes. Pt states it is all day long.     HPI: Persistent HA:  Pt w/ CKD on PD at home daily which runs overnight and wakes him q2h, also sleep apnea, but wears CPAP qhs. Also hx of CAD, PAF, on Eliquis, Denies any alcohol intake, sinus sx, allergies, excess caffeine intake, or increased recent stress.  Seen by another provider 2 days ago and given Vicodin, states 1 helps a little, but requires 2 vicodin to get rid of HA but then causes him to sleep for hours, wakes up HA starts back. HA first started Saturday, but denies it being an actual migraine, as his pain is usually 1-5/10 on pain scale. Reports occ. nausea, vomited 2 days ago (before taking the Vicodin). Denies vision changes, reports mild dizziness at times w/HA.    Assessment & Plan:  1. Persistent headaches Advised pt use Vicodin he has left qhs to help with sleep. Starting Robaxin, advised on use & SE. Checking allergy panel. If neg and HA not resolving after a few days, may check CT scan. Advised pt on hydration, limited caffeine, ok to take Tylenol w/ or alternating w/the Robaxin. Advised on s/s of when to visit the ER.  - Allergen Panel (27) + [facility] - Allergy Panel 11, [facility] - methocarbamol (ROBAXIN) 500 MG tablet; Take 2 tablets (1,000 mg total) by mouth 4 (four) times daily as needed (for Headache). START with 1 pill, if not too drowsy take 2 pills.  Dispense: 60 tablet; Refill: 0   Subjective:    Outpatient Medications Prior to Visit  Medication Sig Dispense Refill   allopurinol (ZYLOPRIM) 300 MG tablet TAKE 1 TABLET BY MOUTH EVERY DAY 90 tablet 2   amoxicillin (AMOXIL) 500 MG tablet Take  4 tablets by mouth 1 hour prior to dental appointments and cleanings 12 tablet 6   atorvastatin (LIPITOR) 40 MG tablet TAKE 1 TABLET BY MOUTH EVERY DAY 90 tablet 2   AURYXIA 1 GM 210 MG(Fe) tablet Take 420 mg by mouth 3 (three) times daily.     calcitRIOL (ROCALTROL) 0.25 MCG capsule Take 0.25 mcg by mouth daily.     colchicine 0.6 MG tablet Take 1 tablet (0.6 mg total) by mouth daily. 1 tablet 0   docusate sodium (COLACE) 100 MG capsule Take 100 mg by mouth daily.     ELIQUIS 5 MG TABS tablet TAKE 1 TABLET BY MOUTH TWICE A DAY 60 tablet 5   gentamicin ointment (GARAMYCIN) 0.1 % Apply 1 application topically daily.     HYDROcodone-acetaminophen (NORCO/VICODIN) 5-325 MG tablet 1-2 tabs po tid prn 10 tablet 0   iron sucrose in sodium chloride 0.9 % 100 mL Iron Sucrose (Venofer)     labetalol (NORMODYNE) 100 MG tablet Take 100 mg by mouth 2 (two) times daily.     Methoxy PEG-Epoetin Beta (MIRCERA IJ) Inject into the skin as needed.     torsemide (DEMADEX) 100 MG tablet Take 1 tablet (100 mg total) by mouth daily. 90 tablet 3   No facility-administered medications prior to visit.   Past Medical History:  Diagnosis Date  Anemia    Bicuspid aortic valve    Blood transfusion without reported diagnosis    CAD (coronary artery disease)    proximal LAD 50%, mid LAD 50-60%, mid to distal LAD 99%; mid circumflex 20%; proximal RCA 20%, distal RCA 50%; PDA 60-70%, proximal posterior AV groove 50%, proximal posterior lateral 50%.  There was a question of possible LVOT gradient;  s/p Resolute DES to mid-distal LAD 10/2010;  echocardiogram 11/17/10: EF 55-60%, mild LVH, grade 1 diastolic dysfunction, normal aortic valve, mild MR, PASP 32.    Chicken pox    Enlarged aorta ([name])    ESRD on peritoneal dialysis ([name])    Glaucoma    Gout    allopurinol '300mg'$ , colchicine prn in past, has not had flares   Hypercholesterolemia    With hypertriglyceridemia   Hypertension    Kidney stones    hx hematuria as  result. cystosopy normal   Mild aortic stenosis    PAF (paroxysmal atrial fibrillation) ([place])    a. Documented on event monitor 01/2016.   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   Sleep apnea    cpap nightly   Past Surgical History:  Procedure Laterality Date   CORONARY STENT PLACEMENT  2012   CYSTOSCOPY/RETROGRADE/URETEROSCOPY  03/26/2012   Procedure: CYSTOSCOPY/RETROGRADE/URETEROSCOPY;  Surgeon: [name], MD;  Location: [facility];  Service: Urology;  Laterality: Bilateral;  CYSTOSCOPY BILATERAL RETROGRADE PYELOGRAM AND POSSIBLE URETEROSCOPY   INTRAOPERATIVE TRANSTHORACIC ECHOCARDIOGRAM N/A 01/19/2021   Procedure: INTRAOPERATIVE TRANSTHORACIC ECHOCARDIOGRAM;  Surgeon: Burnell Blanks, MD;  Location: [place];  Service: Open Heart Surgery;  Laterality: N/A;   RIGHT HEART CATH AND CORONARY ANGIOGRAPHY N/A 12/04/2020   Procedure: RIGHT HEART CATH AND CORONARY ANGIOGRAPHY;  Surgeon: Burnell Blanks, MD;  Location: [place] [facility];  Service: Cardiovascular;  Laterality: N/A;   TRANSCATHETER AORTIC VALVE REPLACEMENT, TRANSFEMORAL N/A 01/19/2021   Procedure: TRANSCATHETER AORTIC VALVE REPLACEMENT, TRANSFEMORAL USING A 26MM EDWARDS VALVE.;  Surgeon: Burnell Blanks, MD;  Location: [place];  Service: Open Heart Surgery;  Laterality: N/A;   No Known Allergies    Objective:    Physical Exam Vitals and nursing note reviewed.  Constitutional:      General: He is not in acute distress.    Appearance: Normal appearance.  HENT:     Head: Normocephalic.  Cardiovascular:     Rate and Rhythm: Normal rate and regular rhythm.  Pulmonary:     Effort: Pulmonary effort is normal.     Breath sounds: Normal breath sounds.  Musculoskeletal:        General: Normal range of motion.     Cervical back: Normal range of motion.   Skin:    General: Skin is warm and dry.  Neurological:     Mental Status: He is alert and oriented to person, place, and time.     Cranial Nerves: Cranial nerves 2-12 are intact.     Sensory: Sensation is intact.     Motor: Motor function is intact.     Coordination: Coordination is intact.     Gait: Gait is intact.  Psychiatric:        Mood and Affect: Mood normal.    BP 126/82 (BP Location: Left Arm, Patient Position: Sitting, Cuff Size: Large)   Pulse 86   Temp 97.6 F (36.4 C) (Temporal)  Ht '5\' 8"'$  (1.727 m)   Wt 212 lb 8 oz (96.4 kg)   SpO2 98%   BMI 32.31 kg/m  Wt Readings from Last 3 Encounters:  09/09/21 212 lb 8 oz (96.4 kg)  09/06/21 219 lb 6.4 oz (99.5 kg)  08/25/21 229 lb 6.4 oz (104.1 kg)       Jeanie Sewer, NP

## 2021-09-09 NOTE — Patient Instructions (Signed)
It was very nice to see you today!   I will review your lab results via MyChart in a few days.   I have sent over generic Robaxin to take for your headache. Start with 1 pill, if not too drowsy can take pills at one time. OK to take with Tylenol or alternate with the Robaxin.  If HA becomes very intense - 9 or 10 out 10 on pain scale or "worst headache ever" call 911 and go to the ER!     PLEASE NOTE:  If you had any lab tests please let us know if you have not heard back within a few days. You may see your results on MyChart before we have a chance to review them but we will give you a call once they are reviewed by [place]. If we ordered any referrals today, please let us know if you have not heard from their office within the next week.

## 2021-09-11 LAB — ALLERGY PANEL 11, MOLD GROUP
Allergen, A. alternata, m6: <0.1 kU/L
Allergen, Mucor Racemosus, M4: <0.1 kU/L
Aspergillus fumigatus, m3: <0.1 kU/L
CLADOSPORIUM HERBARUM (M2) IGE: <0.1 kU/L
CLASS: 0
CLASS: 0
Candida Albicans: <0.1 kU/L
Class: 0
Class: 0
Class: 0

## 2021-09-11 LAB — INTERPRETATION:

## 2021-09-12 LAB — ALLERGEN PANEL (27) + IGE
Alternaria Alternata IgE: <0.1 kU/L
Aspergillus Fumigatus IgE: <0.1 kU/L
Bahia Grass IgE: <0.1 kU/L
Bermuda Grass IgE: <0.1 kU/L
Cat Dander IgE: <0.1 kU/L
Cedar, Mountain IgE: <0.1 kU/L
Cladosporium Herbarum IgE: <0.1 kU/L
Cocklebur IgE: <0.1 kU/L
Cockroach, American IgE: <0.1 kU/L
Common Silver Birch IgE: <0.1 kU/L
D Farinae IgE: <0.1 kU/L
D Pteronyssinus IgE: <0.1 kU/L
Dog Dander IgE: <0.1 kU/L
Elm, American IgE: <0.1 kU/L
Hickory, White IgE: <0.1 kU/L
IgE (Immunoglobulin E), Serum: 15 IU/mL (ref 6–495)
Johnson Grass IgE: <0.1 kU/L
Kentucky Bluegrass IgE: <0.1 kU/L
Maple/Box Elder IgE: <0.1 kU/L
Mucor Racemosus IgE: <0.1 kU/L
Oak, White IgE: <0.1 kU/L
Penicillium Chrysogen IgE: <0.1 kU/L
Pigweed, Rough IgE: <0.1 kU/L
Plantain, English IgE: <0.1 kU/L
Ragweed, Short IgE: <0.1 kU/L
Setomelanomma Rostrat: <0.1 kU/L
Timothy Grass IgE: <0.1 kU/L
White Mulberry IgE: <0.1 kU/L

## 2021-09-16 NOTE — Progress Notes (Signed)
Hi Daniel Solomon,  Your allergen lab panel came back all normal.  No allergens here to explain your headaches.  Hope you are getting some relief with the Robaxin.

## 2021-09-20 ENCOUNTER — Telehealth: Payer: Self-pay | Admitting: Family Medicine

## 2021-09-20 DIAGNOSIS — R519 Headache, unspecified: Secondary | ICD-10-CM

## 2021-09-20 NOTE — Telephone Encounter (Signed)
Pt previously seen on 07/10 at [facility] and 07/13 at [facility], for this complaint.   Pt states  -headaches are not getting better. -headaches are resistant to narcotics -previous experience with narcotic resulted in 6 hours of sleep.  -presently, narcotics give 2 hours of relief then the headache returns -Tylenol makes the pain manageable/bearable, but does not relieve. -headaches waking pt up in the night, not present during the day. -if he wakes up early in the night, ice pack on the painful spot gives relief and he can sleep. -if headache wakes later in the night, nothing provides relief.   Pt requests a call back with next steps.

## 2021-09-20 NOTE — Telephone Encounter (Signed)
I ordered stat MR Brain- team please follow up with our referral coordinator- may have to switch to CT scan due to heart valve replacement- but radiology will give [place] feedback on this- I listed the valve type

## 2021-09-20 NOTE — Telephone Encounter (Signed)
See below, another OV required or can this be answered via phone note?

## 2021-09-21 NOTE — Telephone Encounter (Signed)
Sent to [place] to check on.

## 2021-09-22 ENCOUNTER — Other Ambulatory Visit: Payer: Self-pay

## 2021-09-22 ENCOUNTER — Inpatient Hospital Stay (HOSPITAL_COMMUNITY)
Admission: EM | Admit: 2021-09-22 | Discharge: 2021-09-23 | DRG: 064 | Disposition: A | Discharge status: Home / Self Care | Payer: Managed Care, Other (non HMO) | Attending: Internal Medicine | Admitting: Internal Medicine

## 2021-09-22 ENCOUNTER — Ambulatory Visit (HOSPITAL_COMMUNITY)
Admission: RE | Admit: 2021-09-22 | Discharge: 2021-09-22 | Disposition: A | Discharge status: Home / Self Care | Payer: Managed Care, Other (non HMO) | Source: Ambulatory Visit | Attending: Family Medicine | Admitting: Family Medicine

## 2021-09-22 ENCOUNTER — Encounter (HOSPITAL_COMMUNITY): Payer: Self-pay | Admitting: Emergency Medicine

## 2021-09-22 DIAGNOSIS — S065XAA Traumatic subdural hemorrhage with loss of consciousness status unknown, initial encounter: Secondary | ICD-10-CM | POA: Diagnosis not present

## 2021-09-22 DIAGNOSIS — H409 Unspecified glaucoma: Secondary | ICD-10-CM | POA: Diagnosis present

## 2021-09-22 DIAGNOSIS — Z6832 Body mass index (BMI) 32.0-32.9, adult: Secondary | ICD-10-CM

## 2021-09-22 DIAGNOSIS — E7800 Pure hypercholesterolemia, unspecified: Secondary | ICD-10-CM | POA: Diagnosis present

## 2021-09-22 DIAGNOSIS — Z841 Family history of disorders of kidney and ureter: Secondary | ICD-10-CM | POA: Diagnosis not present

## 2021-09-22 DIAGNOSIS — G4730 Sleep apnea, unspecified: Secondary | ICD-10-CM | POA: Diagnosis present

## 2021-09-22 DIAGNOSIS — Z8249 Family history of ischemic heart disease and other diseases of the circulatory system: Secondary | ICD-10-CM | POA: Diagnosis not present

## 2021-09-22 DIAGNOSIS — E669 Obesity, unspecified: Secondary | ICD-10-CM | POA: Diagnosis present

## 2021-09-22 DIAGNOSIS — I6203 Nontraumatic chronic subdural hemorrhage: Secondary | ICD-10-CM | POA: Diagnosis present

## 2021-09-22 DIAGNOSIS — I7121 Aneurysm of the ascending aorta, without rupture: Secondary | ICD-10-CM | POA: Diagnosis present

## 2021-09-22 DIAGNOSIS — R519 Headache, unspecified: Secondary | ICD-10-CM | POA: Insufficient documentation

## 2021-09-22 DIAGNOSIS — E781 Pure hyperglyceridemia: Secondary | ICD-10-CM | POA: Diagnosis present

## 2021-09-22 DIAGNOSIS — I4819 Other persistent atrial fibrillation: Secondary | ICD-10-CM | POA: Diagnosis present

## 2021-09-22 DIAGNOSIS — N186 End stage renal disease: Secondary | ICD-10-CM | POA: Diagnosis present

## 2021-09-22 DIAGNOSIS — D696 Thrombocytopenia, unspecified: Secondary | ICD-10-CM | POA: Diagnosis present

## 2021-09-22 DIAGNOSIS — Z7901 Long term (current) use of anticoagulants: Secondary | ICD-10-CM | POA: Diagnosis not present

## 2021-09-22 DIAGNOSIS — Z79899 Other long term (current) drug therapy: Secondary | ICD-10-CM | POA: Diagnosis not present

## 2021-09-22 DIAGNOSIS — E876 Hypokalemia: Secondary | ICD-10-CM | POA: Diagnosis present

## 2021-09-22 DIAGNOSIS — Z953 Presence of xenogenic heart valve: Secondary | ICD-10-CM

## 2021-09-22 DIAGNOSIS — I120 Hypertensive chronic kidney disease with stage 5 chronic kidney disease or end stage renal disease: Secondary | ICD-10-CM | POA: Diagnosis present

## 2021-09-22 DIAGNOSIS — M109 Gout, unspecified: Secondary | ICD-10-CM | POA: Diagnosis present

## 2021-09-22 DIAGNOSIS — I6201 Nontraumatic acute subdural hemorrhage: Principal | ICD-10-CM | POA: Diagnosis present

## 2021-09-22 DIAGNOSIS — Z955 Presence of coronary angioplasty implant and graft: Secondary | ICD-10-CM

## 2021-09-22 DIAGNOSIS — Z992 Dependence on renal dialysis: Secondary | ICD-10-CM | POA: Diagnosis not present

## 2021-09-22 DIAGNOSIS — Z8679 Personal history of other diseases of the circulatory system: Secondary | ICD-10-CM | POA: Diagnosis present

## 2021-09-22 DIAGNOSIS — I2510 Atherosclerotic heart disease of native coronary artery without angina pectoris: Secondary | ICD-10-CM | POA: Diagnosis present

## 2021-09-22 LAB — CBC WITH DIFFERENTIAL/PLATELET
Abs Immature Granulocytes: 0.03 10*3/uL (ref 0.00–0.07)
Basophils Absolute: 0 10*3/uL (ref 0.0–0.1)
Basophils Relative: 1 %
Eosinophils Absolute: 0.2 10*3/uL (ref 0.0–0.5)
Eosinophils Relative: 3 %
HCT: 36.2 % — ABNORMAL LOW (ref 39.0–52.0)
Hemoglobin: 12.5 g/dL — ABNORMAL LOW (ref 13.0–17.0)
Immature Granulocytes: 0 %
Lymphocytes Relative: 24 %
Lymphs Abs: 1.7 10*3/uL (ref 0.7–4.0)
MCH: 36.4 pg — ABNORMAL HIGH (ref 26.0–34.0)
MCHC: 34.5 g/dL (ref 30.0–36.0)
MCV: 105.5 fL — ABNORMAL HIGH (ref 80.0–100.0)
Monocytes Absolute: 0.8 10*3/uL (ref 0.1–1.0)
Monocytes Relative: 11 %
Neutro Abs: 4.3 10*3/uL (ref 1.7–7.7)
Neutrophils Relative %: 61 %
Platelets: 138 10*3/uL — ABNORMAL LOW (ref 150–400)
RBC: 3.43 MIL/uL — ABNORMAL LOW (ref 4.22–5.81)
RDW: 13.8 % (ref 11.5–15.5)
WBC: 7 10*3/uL (ref 4.0–10.5)
nRBC: 0 % (ref 0.0–0.2)

## 2021-09-22 LAB — COMPREHENSIVE METABOLIC PANEL
ALT: 41 U/L (ref 0–44)
AST: 21 U/L (ref 15–41)
Albumin: 3.6 g/dL (ref 3.5–5.0)
Alkaline Phosphatase: 82 U/L (ref 38–126)
Anion gap: 17 — ABNORMAL HIGH (ref 5–15)
BUN: 60 mg/dL — ABNORMAL HIGH (ref 6–20)
CO2: 25 mmol/L (ref 22–32)
Calcium: 9.9 mg/dL (ref 8.9–10.3)
Chloride: 94 mmol/L — ABNORMAL LOW (ref 98–111)
Creatinine, Ser: 14.97 mg/dL — ABNORMAL HIGH (ref 0.61–1.24)
GFR, Estimated: 3 mL/min — ABNORMAL LOW (ref >60)
Glucose, Bld: 116 mg/dL — ABNORMAL HIGH (ref 70–99)
Potassium: 3.4 mmol/L — ABNORMAL LOW (ref 3.5–5.1)
Sodium: 136 mmol/L (ref 135–145)
Total Bilirubin: 0.6 mg/dL (ref 0.3–1.2)
Total Protein: 6.8 g/dL (ref 6.5–8.1)

## 2021-09-22 LAB — PROTIME-INR
INR: 1.3 — ABNORMAL HIGH (ref 0.8–1.2)
Prothrombin Time: 16.2 seconds — ABNORMAL HIGH (ref 11.4–15.2)

## 2021-09-22 MED ORDER — OXYCODONE HCL 5 MG PO TABS
5.0000 mg | ORAL_TABLET | Freq: Four times a day (QID) | ORAL | Status: DC | PRN
  Administered 2021-09-23: 5 mg via ORAL
  Filled 2021-09-22: qty 1

## 2021-09-22 MED ORDER — ACETAMINOPHEN 325 MG PO TABS
650.0000 mg | ORAL_TABLET | Freq: Four times a day (QID) | ORAL | Status: DC | PRN
  Administered 2021-09-23: 650 mg via ORAL
  Filled 2021-09-22: qty 2

## 2021-09-22 MED ORDER — LEVETIRACETAM IN NACL 500 MG/100ML IV SOLN
500.0000 mg | Freq: Two times a day (BID) | INTRAVENOUS | Status: DC
  Administered 2021-09-23: 500 mg via INTRAVENOUS
  Filled 2021-09-22 (×2): qty 100

## 2021-09-22 MED ORDER — DELFLEX-LC/1.5% DEXTROSE 344 MOSM/L IP SOLN: INTRAPERITONEAL | Status: DC

## 2021-09-22 MED ORDER — LABETALOL HCL 100 MG PO TABS
100.0000 mg | ORAL_TABLET | Freq: Two times a day (BID) | ORAL | Status: DC
Start: 2021-09-23 — End: 2021-09-22

## 2021-09-22 MED ORDER — HYDROMORPHONE HCL 1 MG/ML IJ SOLN: 0.5000 mg | INTRAMUSCULAR | Status: DC | PRN

## 2021-09-22 MED ORDER — MELATONIN 5 MG PO TABS
5.0000 mg | ORAL_TABLET | Freq: Every evening | ORAL | Status: DC | PRN
  Filled 2021-09-22: qty 1

## 2021-09-22 MED ORDER — POTASSIUM CHLORIDE CRYS ER 20 MEQ PO TBCR
40.0000 meq | EXTENDED_RELEASE_TABLET | Freq: Once | ORAL | Status: DC
Start: 2021-09-22 — End: 2021-09-22

## 2021-09-22 MED ORDER — PROCHLORPERAZINE EDISYLATE 10 MG/2ML IJ SOLN
10.0000 mg | Freq: Four times a day (QID) | INTRAMUSCULAR | Status: DC | PRN
Start: 2021-09-22 — End: 2021-09-23

## 2021-09-22 MED ORDER — GENTAMICIN SULFATE 0.1 % EX CREA
1.0000 | TOPICAL_CREAM | Freq: Every day | CUTANEOUS | Status: DC
  Administered 2021-09-23: 1 via TOPICAL
  Filled 2021-09-22: qty 15

## 2021-09-22 MED ORDER — POTASSIUM CHLORIDE CRYS ER 20 MEQ PO TBCR
20.0000 meq | EXTENDED_RELEASE_TABLET | Freq: Once | ORAL | Status: AC
  Administered 2021-09-23: 20 meq via ORAL
  Filled 2021-09-22: qty 1

## 2021-09-22 NOTE — Telephone Encounter (Signed)
Please check on this this afternoon-directly speak with Daniel Solomon please.  Also please call patient to update him on status

## 2021-09-22 NOTE — Telephone Encounter (Signed)
Lattie Haw is out of office today, I spoke directly to [place] will be looking into this.

## 2021-09-22 NOTE — Telephone Encounter (Signed)
Patient scheduled at [facility] for today, 09/22/21 at [place].

## 2021-09-22 NOTE — Telephone Encounter (Signed)
FYI

## 2021-09-22 NOTE — ED Provider Notes (Signed)
[facility] EMERGENCY DEPARTMENT Provider Note   CSN: [id number] Arrival date & time: 09/22/21  1810     History  No chief complaint on file.   Daniel Solomon is a 61 y.o. male.  Reports Daniel Solomon has been having a headache for the past 2 weeks.  Patient reports the headache gets better with ice packs and showers.  Patient is on Eliquis for atrial fib and aortic valve replacement.  Patient has end-stage renal disease on peritoneal dialysis.  Daniel Solomon reports Daniel Solomon is on a transplant list.  Has taken his Eliquis today  The history is provided by the patient. No language interpreter was used.       Home Medications Prior to Admission medications   Medication Sig Start Date End Date Taking? Authorizing Provider  allopurinol (ZYLOPRIM) 300 MG tablet TAKE 1 TABLET BY MOUTH EVERY DAY 04/19/21   Marin Olp, MD  amoxicillin (AMOXIL) 500 MG tablet Take 4 tablets by mouth 1 hour prior to dental appointments and cleanings 01/27/21   Eileen Stanford, PA-C  atorvastatin (LIPITOR) 40 MG tablet TAKE 1 TABLET BY MOUTH EVERY DAY 02/16/21   Marin Olp, MD  AURYXIA 1 GM 210 MG(Fe) tablet Take 420 mg by mouth 3 (three) times daily. 12/23/20   [provider]  calcitRIOL (ROCALTROL) 0.25 MCG capsule Take 0.25 mcg by mouth daily. 01/29/21   [provider]  colchicine 0.6 MG tablet Take 1 tablet (0.6 mg total) by mouth daily. 01/27/21   Eileen Stanford, PA-C  docusate sodium (COLACE) 100 MG capsule Take 100 mg by mouth daily.    [provider]  ELIQUIS 5 MG TABS tablet TAKE 1 TABLET BY MOUTH TWICE A DAY 07/08/21   Burnell Blanks, MD  gentamicin ointment (GARAMYCIN) 0.1 % Apply 1 application topically daily.    [provider]  HYDROcodone-acetaminophen (NORCO/VICODIN) 5-325 MG tablet 1-2 tabs po tid prn 09/07/21   McGowen, Adrian Blackwater, MD  iron sucrose in sodium chloride 0.9 % 100 mL Iron Sucrose (Venofer) 11/23/20   [provider]   labetalol (NORMODYNE) 100 MG tablet Take 100 mg by mouth 2 (two) times daily. 04/30/20   [provider]  methocarbamol (ROBAXIN) 500 MG tablet Take 2 tablets (1,000 mg total) by mouth 4 (four) times daily as needed (for Headache). START with 1 pill, if not too drowsy take 2 pills. 09/09/21   Jeanie Sewer, NP  Methoxy PEG-Epoetin Beta (MIRCERA IJ) Inject into the skin as needed. 06/15/20   [provider]  torsemide (DEMADEX) 100 MG tablet Take 1 tablet (100 mg total) by mouth daily. 08/25/21   Burnell Blanks, MD      Allergies    Patient has no known allergies.    Review of Systems   Review of Systems  HENT:  Negative for ear pain, facial swelling and hearing loss.   Eyes:  Negative for photophobia and visual disturbance.  Gastrointestinal:  Negative for nausea and vomiting.  All other systems reviewed and are negative.   Physical Exam Updated Vital Signs BP (!) 144/94   Pulse 79   Temp 97.9 F (36.6 C) (Oral)   Resp 20   SpO2 98%  Physical Exam Vitals and nursing note reviewed.  Constitutional:      Appearance: Daniel Solomon is well-developed.  HENT:     Head: Normocephalic.     Mouth/Throat:     Mouth: Mucous membranes are moist.  Eyes:     Extraocular  Movements: Extraocular movements intact.     Pupils: Pupils are equal, round, and reactive to light.  Cardiovascular:     Rate and Rhythm: Normal rate and regular rhythm.  Pulmonary:     Effort: Pulmonary effort is normal.     Breath sounds: Normal breath sounds.  Abdominal:     General: There is no distension.  Musculoskeletal:        General: Normal range of motion.     Cervical back: Normal range of motion.  Neurological:     General: No focal deficit present.     Mental Status: Daniel Solomon is alert and oriented to person, place, and time.  Psychiatric:        Mood and Affect: Mood normal.     ED Results / Procedures / Treatments   Labs (all labs ordered are listed, but only abnormal results are  displayed) Labs Reviewed - No data to display  EKG None  Radiology MR Brain Wo Contrast  Result Date: 09/22/2021 CLINICAL DATA:  Headache, new or worsening. EXAM: MRI HEAD WITHOUT CONTRAST TECHNIQUE: Multiplanar, multiecho pulse sequences of the brain and surrounding structures were obtained without intravenous contrast. COMPARISON:  None FINDINGS: Brain: There is an acute subdural hematoma along the convexity on the left with maximal thickness of 14 mm. There is mass effect on the left hemisphere with left-to-right shift of 6 mm. The dentate nuclei regions of the cerebellum show either dilated perivascular spaces or old infarctions. There could be some calcification in this region. No supra tentorial stroke, intra-axial mass or hydrocephalus. Vascular: Major vessels at the base of the brain show flow. Skull and upper cervical spine: Negative Sinuses/Orbits: Mild mucosal thickening of the right maxillary sinus floor. Orbits negative. Other: None IMPRESSION: Acute left convexity subdural hematoma running from front to back with maximal thickness of 14 mm. Mass effect with left-to-right shift of 6 mm. We are trying to retrieve the patient, who was escorted towards the exit. Call report to ordering physician in progress. If the patient can be caught before Daniel Solomon leaves, Daniel Solomon will be taken to the emergency room. Electronically Signed   By: Nelson Chimes M.D.   On: 09/22/2021 18:03    Procedures Procedures    Medications Ordered in ED Medications - No data to display  ED Course/ Medical Decision Making/ A&P                           Medical Decision Making Pt complains of a headache for the past 2 weeks   Amount and/or Complexity of Data Reviewed External Data Reviewed: radiology.    Details: Outpt MRi reviewed,  Pt has an acute subdural hematoma Labs: ordered. Decision-making details documented in ED Course.    Details: labs ordered reviewed and interpreted Radiology: ordered and independent  interpretation performed. Decision-making details documented in ED Course.    Details: MRi shows Subdural wiht 74m shift Discussion of management or test interpretation with external provider(s): Neurosurgery consulted I spoke with KGlenford Peerswho is the provider on call for Dr. JRonnald Ramp  She spoke with Dr. NKathyrn Sheriffwho advised Daniel Solomon will do a thrombectomy on Tuesday Daniel Solomon has requested that patient be admitted to medicine for stabilization and to have his Eliquis stopped.  She advised neurosurgery will see and document consult with recommendations Hospitalist consulted for admission.  Risk Decision regarding hospitalization.           Final Clinical Impression(s) / ED Diagnoses Final diagnoses:  Subdural hematoma [facility])    Rx / DC Orders ED Discharge Orders     None         Sidney Ace 09/22/21 2253    Gareth Morgan, MD 09/23/21 [phone]

## 2021-09-22 NOTE — Progress Notes (Signed)
Called in regards to this patients MRI brain after having headaches for two weeks. MRI shows a moderate left sided SDH with some midline shift and mass effect. Difficult to tell whether this is acute or chronic on an mri. Recommend medicine admit since he has a complex medical history and is on xarelto for heart valve. Dialysis needs to be done without heparin. No surgical intervention right now. We are planning for Dr. Kathyrn Sheriff to do an MMA embolization next Tuesday. Formal consult to follow.

## 2021-09-22 NOTE — H&P (Addendum)
History and Physical  Daniel Solomon OVF:[id number] DOB: April 26, 1960 DOA: 09/22/2021  Referring physician: Caryl Ada, PA-EDP  PCP: Daniel Olp, MD  Outpatient Specialists: Nephrology Patient coming from: Home  Chief Complaint: Headaches   HPI: Daniel Solomon is a 61 y.o. male with medical history significant for ESRD on PD, persistent atrial fibrillation on Eliquis, bicuspid aortic valve with severe aortic stenosis status post TAVR, sleep apnea, thoracic aortic aneurysm, coronary artery disease, hypertension, hyperlipidemia, who presented to [facility] ED at the recommendation of his PCP after abnormal MRI brain findings.  The patient endorses having a headache for the past 2 weeks.  Associated with nausea and vomiting 5 days ago.  Denies any head trauma or uncontrolled hypertension.  He checks his blood pressure twice daily at home and it has been normotensive.    Due to persistent headaches he presented to his primary care provider who ordered outpatient MRI brain.  Subsequently revealed subdural hematoma running from front to back with maximal thickness of 14 mm, mass effect with left-to-right shift of 6 mm.  Last dose of home Eliquis was this morning.  The patient presented to the ED for further evaluation and management of his intracranial hemorrhage.  EDP discussed the case with Meyran neurosurgery PA who recommended holding off home Eliquis, and no reversal agents.  Start AED prophylactically IV Keppra 500 mg twice daily x7 days.  The patient was admitted by [facility], hospitalist service.  ED Course: Tmax 97.9.  BP 123/90, pulse 75, respiratory 15, saturation 97% on room air.  Lab studies remarkable for serum potassium 3.4, hemoglobin 12.5, platelet 138.  Review of Systems: Review of systems as noted in the HPI. All other systems reviewed and are negative.   Past Medical History:  Diagnosis Date   Anemia    Bicuspid aortic valve    Blood transfusion without reported diagnosis    CAD  (coronary artery disease)    proximal LAD 50%, mid LAD 50-60%, mid to distal LAD 99%; mid circumflex 20%; proximal RCA 20%, distal RCA 50%; PDA 60-70%, proximal posterior AV groove 50%, proximal posterior lateral 50%.  There was a question of possible LVOT gradient;  s/p Resolute DES to mid-distal LAD 10/2010;  echocardiogram 11/17/10: EF 55-60%, mild LVH, grade 1 diastolic dysfunction, normal aortic valve, mild MR, PASP 32.    Chicken pox    Enlarged aorta ([name])    ESRD on peritoneal dialysis ([name])    Glaucoma    Gout    allopurinol '300mg'$ , colchicine prn in past, has not had flares   Hypercholesterolemia    With hypertriglyceridemia   Hypertension    Kidney stones    hx hematuria as result. cystosopy normal   Mild aortic stenosis    PAF (paroxysmal atrial fibrillation) ([place])    a. Documented on event monitor 01/2016.   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   Sleep apnea    cpap nightly   Past Surgical History:  Procedure Laterality Date   CORONARY STENT PLACEMENT  2012   CYSTOSCOPY/RETROGRADE/URETEROSCOPY  03/26/2012   Procedure: CYSTOSCOPY/RETROGRADE/URETEROSCOPY;  Surgeon: [name], MD;  Location: [facility];  Service: Urology;  Laterality: Bilateral;  CYSTOSCOPY BILATERAL RETROGRADE PYELOGRAM AND POSSIBLE URETEROSCOPY   INTRAOPERATIVE TRANSTHORACIC ECHOCARDIOGRAM N/A 01/19/2021   Procedure: INTRAOPERATIVE TRANSTHORACIC ECHOCARDIOGRAM;  Surgeon: Burnell Blanks, MD;  Location: [facility]  OR;  Service: Open Heart Surgery;  Laterality: N/A;   RIGHT HEART CATH AND CORONARY ANGIOGRAPHY N/A 12/04/2020   Procedure: RIGHT HEART CATH AND CORONARY ANGIOGRAPHY;  Surgeon: Burnell Blanks, MD;  Location: [facility];  Service: Cardiovascular;  Laterality: N/A;   TRANSCATHETER AORTIC VALVE REPLACEMENT, TRANSFEMORAL N/A  01/19/2021   Procedure: TRANSCATHETER AORTIC VALVE REPLACEMENT, TRANSFEMORAL USING A 26MM EDWARDS VALVE.;  Surgeon: Burnell Blanks, MD;  Location: [place];  Service: Open Heart Surgery;  Laterality: N/A;    Social History:  reports that he has never smoked. He has never used smokeless tobacco. He reports that he does not drink alcohol and does not use drugs.   No Known Allergies  Family History  Problem Relation Age of Onset   Hypertension Father    CAD Father        CABG 36s   Kidney disease Father        Stage IV   Valvular heart disease Mother        AVR July 2015   Diabetes Paternal Grandmother    Colon cancer Neg Hx       Prior to Admission medications   Medication Sig Start Date End Date Taking? Authorizing Provider  allopurinol (ZYLOPRIM) 300 MG tablet TAKE 1 TABLET BY MOUTH EVERY DAY 04/19/21   Daniel Olp, MD  amoxicillin (AMOXIL) 500 MG tablet Take 4 tablets by mouth 1 hour prior to dental appointments and cleanings 01/27/21   Eileen Stanford, PA-C  atorvastatin (LIPITOR) 40 MG tablet TAKE 1 TABLET BY MOUTH EVERY DAY 02/16/21   Daniel Olp, MD  AURYXIA 1 GM 210 MG(Fe) tablet Take 420 mg by mouth 3 (three) times daily. 12/23/20   [provider]  calcitRIOL (ROCALTROL) 0.25 MCG capsule Take 0.25 mcg by mouth daily. 01/29/21   [provider]  colchicine 0.6 MG tablet Take 1 tablet (0.6 mg total) by mouth daily. 01/27/21   Eileen Stanford, PA-C  docusate sodium (COLACE) 100 MG capsule Take 100 mg by mouth daily.    [provider]  ELIQUIS 5 MG TABS tablet TAKE 1 TABLET BY MOUTH TWICE A DAY 07/08/21   Burnell Blanks, MD  gentamicin ointment (GARAMYCIN) 0.1 % Apply 1 application topically daily.    [provider]  HYDROcodone-acetaminophen (NORCO/VICODIN) 5-325 MG tablet 1-2 tabs po tid prn 09/07/21   McGowen, Adrian Blackwater, MD  iron sucrose in sodium chloride 0.9 % 100 mL Iron Sucrose (Venofer) 11/23/20    [provider]  labetalol (NORMODYNE) 100 MG tablet Take 100 mg by mouth 2 (two) times daily. 04/30/20   [provider]  methocarbamol (ROBAXIN) 500 MG tablet Take 2 tablets (1,000 mg total) by mouth 4 (four) times daily as needed (for Headache). START with 1 pill, if not too drowsy take 2 pills. 09/09/21   Jeanie Sewer, NP  Methoxy PEG-Epoetin Beta (MIRCERA IJ) Inject into the skin as needed. 06/15/20   [provider]  torsemide (DEMADEX) 100 MG tablet Take 1 tablet (100 mg total) by mouth daily. 08/25/21   Burnell Blanks, MD    Physical Exam: BP 121/82   Pulse 87   Temp 97.9 F (36.6 C) (Oral)   Resp 14   Ht '5\' 8"'$  (1.727 m)   Wt 96.4 kg   SpO2 98%   BMI 32.31 kg/m   General: 61 y.o. year-old male well developed well nourished in no acute distress.  Alert and oriented x3. Cardiovascular:  Irregular rate and rhythm with no rubs or gallops.  No thyromegaly or JVD noted.  No lower extremity edema. 2/4 pulses in all 4 extremities. Respiratory: Clear to auscultation with no wheezes or rales. Good inspiratory effort. Abdomen: Soft nontender nondistended with normal bowel sounds x4 quadrants. Muskuloskeletal: No cyanosis, clubbing or edema noted bilaterally Neuro: CN II-XII intact, strength, sensation, reflexes Skin: No ulcerative lesions noted or rashes Psychiatry: Judgement and insight appear normal. Mood is appropriate for condition and setting          Labs on Admission:  Basic Metabolic Panel: Recent Labs  Lab 09/22/21 1849  NA 136  K 3.4*  CL 94*  CO2 25  GLUCOSE 116*  BUN 60*  CREATININE 14.97*  CALCIUM 9.9   Liver Function Tests: Recent Labs  Lab 09/22/21 1849  AST 21  ALT 41  ALKPHOS 82  BILITOT 0.6  PROT 6.8  ALBUMIN 3.6   No results for input(s): "LIPASE", "AMYLASE" in the last 168 hours. No results for input(s): "AMMONIA" in the last 168 hours. CBC: Recent Labs  Lab 09/22/21 1849  WBC 7.0  NEUTROABS 4.3  HGB  12.5*  HCT 36.2*  MCV 105.5*  PLT 138*   Cardiac Enzymes: No results for input(s): "CKTOTAL", "CKMB", "CKMBINDEX", "TROPONINI" in the last 168 hours.  BNP (last 3 results) Recent Labs    01/15/21 1000  BNP 123.8*    ProBNP (last 3 results) No results for input(s): "PROBNP" in the last 8760 hours.  CBG: No results for input(s): "GLUCAP" in the last 168 hours.  Radiological Exams on Admission: MR Brain Wo Contrast  Result Date: 09/22/2021 CLINICAL DATA:  Headache, new or worsening. EXAM: MRI HEAD WITHOUT CONTRAST TECHNIQUE: Multiplanar, multiecho pulse sequences of the brain and surrounding structures were obtained without intravenous contrast. COMPARISON:  None FINDINGS: Brain: There is an acute subdural hematoma along the convexity on the left with maximal thickness of 14 mm. There is mass effect on the left hemisphere with left-to-right shift of 6 mm. The dentate nuclei regions of the cerebellum show either dilated perivascular spaces or old infarctions. There could be some calcification in this region. No supra tentorial stroke, intra-axial mass or hydrocephalus. Vascular: Major vessels at the base of the brain show flow. Skull and upper cervical spine: Negative Sinuses/Orbits: Mild mucosal thickening of the right maxillary sinus floor. Orbits negative. Other: None IMPRESSION: Acute left convexity subdural hematoma running from front to back with maximal thickness of 14 mm. Mass effect with left-to-right shift of 6 mm. We are trying to retrieve the patient, who was escorted towards the exit. Call report to ordering physician in progress. If the patient can be caught before he leaves, he will be taken to the emergency room. Electronically Signed   By: Nelson Chimes M.D.   On: 09/22/2021 18:03    EKG: I independently viewed the EKG done and my findings are as followed: Irregular rate and rhythm, rate of 85.  Nonspecific ST-T changes.  QTc 478.  Assessment/Plan Present on Admission:   Subdural hematoma ([name])  Principal Problem:   Subdural hematoma ([name])  Spontaneous subdural hematoma, POA Denies trauma or uncontrolled hypertension Last dose home Eliquis was this morning 09/22/2021. EDP discussed the case with neurosurgery The writer also discussed the case with neurosurgery, Meyran, via phone. Recommended to hold off home Eliquis, no reversal agents. Recommended to repeat CT scan in the morning 5 AM. Started on AED, Keppra prophylactically 500 mg twice daily x7 days Seizure precautions SBP  goal 100-130 Closely monitor on progressive neurosurgery unit. N.p.o. after midnight or until seen by neurosurgery.  Headaches, likely secondary to intracranial hemorrhage Supportive care Rest of management per neurosurgery.  ESRD on PD Nephrology consulted to resume home PD Volume status and electrolytes managed with PD  Hypokalemia Serum potassium 3.4 Repleted judiciously in the setting of ESRD with 20 mEq of KCl x1  Mild thrombocytopenia Platelet count 138 Monitor for now  Persistent atrial fibrillation, on Eliquis prior to admission Continue to hold off home Eliquis due to intracranial hemorrhage Monitor on telemetry. He is on labetalol prior to admission, hold off to avoid hypotension with PD.  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  Code Status: Full code  Family Communication: Wife at bedside.  Disposition Plan: Admitted to progressive care unit.  Consults called: Neurosurgery and nephrology.  Admission status: Inpatient status   Status is: Inpatient The patient requires at least 2 midnights for further evaluation and treatment of present condition.   Kayleen Memos MD [facility] Pager [phone]  If 7PM-7AM, please contact night-coverage [email or web address] Password [facility]  09/22/2021,  10:06 PM

## 2021-09-22 NOTE — Progress Notes (Signed)
ESRD on PD coming with subdural.  Orders written for home PD using all 1.5% fluid.  No heparin was added to the PD fluid bags.  Full consult to follow.  Madelon Lips MD [employer] [facility] [phone]

## 2021-09-22 NOTE — Consult Note (Signed)
Reason for Consult:SDH Referring Physician: EDP  Daniel Solomon is an 61 y.o. male.   HPI:  61 year old male was sent to [place] for a scheduled MRI brain ordered by his pcp. He has been having consistent headaches for the last 2 weeks which is abnormal for him. He has had no other symptoms. Denies any nausea vomiting dizziness or vision changes. His wife is at the bedside with him. He is on eliquis for afib and an aortic heart valve.   Past Medical History:  Diagnosis Date   Anemia    Bicuspid aortic valve    Blood transfusion without reported diagnosis    CAD (coronary artery disease)    proximal LAD 50%, mid LAD 50-60%, mid to distal LAD 99%; mid circumflex 20%; proximal RCA 20%, distal RCA 50%; PDA 60-70%, proximal posterior AV groove 50%, proximal posterior lateral 50%.  There was a question of possible LVOT gradient;  s/p Resolute DES to mid-distal LAD 10/2010;  echocardiogram 11/17/10: EF 55-60%, mild LVH, grade 1 diastolic dysfunction, normal aortic valve, mild MR, PASP 32.    Chicken pox    Enlarged aorta ([name])    ESRD on peritoneal dialysis ([name])    Glaucoma    Gout    allopurinol '300mg'$ , colchicine prn in past, has not had flares   Hypercholesterolemia    With hypertriglyceridemia   Hypertension    Kidney stones    hx hematuria as result. cystosopy normal   Mild aortic stenosis    PAF (paroxysmal atrial fibrillation) ([place])    a. Documented on event monitor 01/2016.   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   Sleep apnea    cpap nightly    Past Surgical History:  Procedure Laterality Date   CORONARY STENT PLACEMENT  2012   CYSTOSCOPY/RETROGRADE/URETEROSCOPY  03/26/2012   Procedure: CYSTOSCOPY/RETROGRADE/URETEROSCOPY;  Surgeon: [name], MD;  Location: [facility];  Service: Urology;  Laterality: Bilateral;   CYSTOSCOPY BILATERAL RETROGRADE PYELOGRAM AND POSSIBLE URETEROSCOPY   INTRAOPERATIVE TRANSTHORACIC ECHOCARDIOGRAM N/A 01/19/2021   Procedure: INTRAOPERATIVE TRANSTHORACIC ECHOCARDIOGRAM;  Surgeon: Burnell Blanks, MD;  Location: [place];  Service: Open Heart Surgery;  Laterality: N/A;   RIGHT HEART CATH AND CORONARY ANGIOGRAPHY N/A 12/04/2020   Procedure: RIGHT HEART CATH AND CORONARY ANGIOGRAPHY;  Surgeon: Burnell Blanks, MD;  Location: [facility] [place] [facility];  Service: Cardiovascular;  Laterality: N/A;   TRANSCATHETER AORTIC VALVE REPLACEMENT, TRANSFEMORAL N/A 01/19/2021   Procedure: TRANSCATHETER AORTIC VALVE REPLACEMENT, TRANSFEMORAL USING A 26MM EDWARDS VALVE.;  Surgeon: Burnell Blanks, MD;  Location: [place];  Service: Open Heart Surgery;  Laterality: N/A;    No Known Allergies  Social History   Tobacco Use   Smoking status: Never   Smokeless tobacco: Never  Substance Use Topics   Alcohol use: No    Family History  Problem Relation Age of Onset   Hypertension Father    CAD Father        CABG 37s   Kidney disease Father        Stage IV   Valvular heart disease Mother        AVR July 2015   Diabetes Paternal Grandmother    Colon cancer Neg Hx      Review of Systems  Positive ROS: as above  All other systems have been reviewed  and were otherwise negative with the exception of those mentioned in the HPI and as above.  Objective: Vital signs in last 24 hours: Temp:  [97.9 F (36.6 C)] 97.9 F (36.6 C) (07/26 2314) Pulse Rate:  [79-91] 91 (07/26 2314) Resp:  [11-20] 17 (07/26 2314) BP: (91-144)/(80-94) 122/81 (07/26 2314) SpO2:  [96 %-99 %] 96 % (07/26 2314) Weight:  [96.4 kg] 96.4 kg (07/26 1910)  General Appearance: Alert, cooperative, no distress, appears stated age Head: Normocephalic, without obvious abnormality, atraumatic Eyes: PERRL, conjunctiva/corneas clear, EOM's intact, fundi benign, both eyes      Back: Symmetric, no curvature, ROM  normal, no CVA tenderness Lungs: respirations unlabored Heart: Regular rate and rhythm Extremities: Extremities normal, atraumatic, no cyanosis or edema Pulses: 2+ and symmetric all extremities Skin: Skin color, texture, turgor normal, no rashes or lesions  NEUROLOGIC:   Mental status: A&O x4, no aphasia, good attention span, Memory and fund of knowledge Motor Exam - grossly normal, normal tone and bulk Sensory Exam - grossly normal Reflexes: symmetric, no pathologic reflexes, No Hoffman's, No clonus Coordination - grossly normal Gait - not tested Balance - not tested Cranial Nerves: I: smell Not tested  II: visual acuity  OS: na    OD: na  II: visual fields Full to confrontation  II: pupils Equal, round, reactive to light  III,VII: ptosis None  III,IV,VI: extraocular muscles  Full ROM  V: mastication Normal  V: facial light touch sensation  Normal  V,VII: corneal reflex  Present  VII: facial muscle function - upper  Normal  VII: facial muscle function - lower Normal  VIII: hearing Not tested  IX: soft palate elevation  Normal  IX,X: gag reflex Present  XI: trapezius strength  5/5  XI: sternocleidomastoid strength 5/5  XI: neck flexion strength  5/5  XII: tongue strength  Normal    Data Review Lab Results  Component Value Date   WBC 7.0 09/22/2021   HGB 12.5 (L) 09/22/2021   HCT 36.2 (L) 09/22/2021   MCV 105.5 (H) 09/22/2021   PLT 138 (L) 09/22/2021   Lab Results  Component Value Date   NA 136 09/22/2021   K 3.4 (L) 09/22/2021   CL 94 (L) 09/22/2021   CO2 25 09/22/2021   BUN 60 (H) 09/22/2021   CREATININE 14.97 (H) 09/22/2021   GLUCOSE 116 (H) 09/22/2021   Lab Results  Component Value Date   INR 1.3 (H) 09/22/2021    Radiology: MR Brain Wo Contrast  Result Date: 09/22/2021 CLINICAL DATA:  Headache, new or worsening. EXAM: MRI HEAD WITHOUT CONTRAST TECHNIQUE: Multiplanar, multiecho pulse sequences of the brain and surrounding structures were obtained  without intravenous contrast. COMPARISON:  None FINDINGS: Brain: There is an acute subdural hematoma along the convexity on the left with maximal thickness of 14 mm. There is mass effect on the left hemisphere with left-to-right shift of 6 mm. The dentate nuclei regions of the cerebellum show either dilated perivascular spaces or old infarctions. There could be some calcification in this region. No supra tentorial stroke, intra-axial mass or hydrocephalus. Vascular: Major vessels at the base of the brain show flow. Skull and upper cervical spine: Negative Sinuses/Orbits: Mild mucosal thickening of the right maxillary sinus floor. Orbits negative. Other: None IMPRESSION: Acute left convexity subdural hematoma running from front to back with maximal thickness of 14 mm. Mass effect with left-to-right shift of 6 mm. We are trying to retrieve the patient, who was escorted towards the exit. Call report to  ordering physician in progress. If the patient can be caught before he leaves, he will be taken to the emergency room. Electronically Signed   By: Nelson Chimes M.D.   On: 09/22/2021 18:03     Assessment/Plan: 61 year old male presented to [facility] for a scheduled outpatient MRI of his brain for persistent headaches. MRI showed a left convexity SDH measuring 25m in size with some mass effect and midline shift. No urgent surgical intervention is warranted at this time. No anticoagulation right. I do not think he needs reversal of his eliquis right now. We will get a repeat head CT in the morning to better assess the acuteness of this SDH. If it is chronic we will discuss the option of MMA emoblization with Dr. NKathyrn Sheriff If it is acute we will likely just watch this depending on the scan.   KOcie CornfieldMeyran 09/22/2021 11:38 PM

## 2021-09-22 NOTE — ED Notes (Signed)
This RN attempted to call report at this time.  

## 2021-09-22 NOTE — ED Triage Notes (Signed)
Pt came in from home due to headache for the past two weeks that would not go away.  MRI showed abnormalities.

## 2021-09-23 ENCOUNTER — Inpatient Hospital Stay (HOSPITAL_COMMUNITY): Payer: Managed Care, Other (non HMO)

## 2021-09-23 DIAGNOSIS — S065XAA Traumatic subdural hemorrhage with loss of consciousness status unknown, initial encounter: Secondary | ICD-10-CM | POA: Diagnosis not present

## 2021-09-23 LAB — COMPREHENSIVE METABOLIC PANEL
ALT: 38 U/L (ref 0–44)
AST: 21 U/L (ref 15–41)
Albumin: 3.4 g/dL — ABNORMAL LOW (ref 3.5–5.0)
Alkaline Phosphatase: 81 U/L (ref 38–126)
Anion gap: 16 — ABNORMAL HIGH (ref 5–15)
BUN: 60 mg/dL — ABNORMAL HIGH (ref 6–20)
CO2: 24 mmol/L (ref 22–32)
Calcium: 9.8 mg/dL (ref 8.9–10.3)
Chloride: 99 mmol/L (ref 98–111)
Creatinine, Ser: 14.67 mg/dL — ABNORMAL HIGH (ref 0.61–1.24)
GFR, Estimated: 3 mL/min — ABNORMAL LOW (ref >60)
Glucose, Bld: 101 mg/dL — ABNORMAL HIGH (ref 70–99)
Potassium: 3.5 mmol/L (ref 3.5–5.1)
Sodium: 139 mmol/L (ref 135–145)
Total Bilirubin: 0.6 mg/dL (ref 0.3–1.2)
Total Protein: 7.1 g/dL (ref 6.5–8.1)

## 2021-09-23 LAB — CBC WITH DIFFERENTIAL/PLATELET
Abs Immature Granulocytes: 0.03 10*3/uL (ref 0.00–0.07)
Basophils Absolute: 0.1 10*3/uL (ref 0.0–0.1)
Basophils Relative: 1 %
Eosinophils Absolute: 0.2 10*3/uL (ref 0.0–0.5)
Eosinophils Relative: 3 %
HCT: 36.4 % — ABNORMAL LOW (ref 39.0–52.0)
Hemoglobin: 12.2 g/dL — ABNORMAL LOW (ref 13.0–17.0)
Immature Granulocytes: 1 %
Lymphocytes Relative: 23 %
Lymphs Abs: 1.5 10*3/uL (ref 0.7–4.0)
MCH: 34.5 pg — ABNORMAL HIGH (ref 26.0–34.0)
MCHC: 33.5 g/dL (ref 30.0–36.0)
MCV: 102.8 fL — ABNORMAL HIGH (ref 80.0–100.0)
Monocytes Absolute: 0.7 10*3/uL (ref 0.1–1.0)
Monocytes Relative: 11 %
Neutro Abs: 4 10*3/uL (ref 1.7–7.7)
Neutrophils Relative %: 61 %
Platelets: 140 10*3/uL — ABNORMAL LOW (ref 150–400)
RBC: 3.54 MIL/uL — ABNORMAL LOW (ref 4.22–5.81)
RDW: 13.6 % (ref 11.5–15.5)
WBC: 6.4 10*3/uL (ref 4.0–10.5)
nRBC: 0 % (ref 0.0–0.2)

## 2021-09-23 LAB — PHOSPHORUS: Phosphorus: 5.6 mg/dL — ABNORMAL HIGH (ref 2.5–4.6)

## 2021-09-23 LAB — MAGNESIUM: Magnesium: 2.6 mg/dL — ABNORMAL HIGH (ref 1.7–2.4)

## 2021-09-23 LAB — HIV ANTIBODY (ROUTINE TESTING W REFLEX): HIV Screen 4th Generation wRfx: NONREACTIVE

## 2021-09-23 MED ORDER — LEVETIRACETAM 500 MG PO TABS: 500.0000 mg | ORAL_TABLET | Freq: Two times a day (BID) | ORAL | 0 refills | Status: DC

## 2021-09-23 MED ORDER — HYDROCODONE-ACETAMINOPHEN 5-325 MG PO TABS: 1.0000 | ORAL_TABLET | Freq: Four times a day (QID) | ORAL | 0 refills | Status: DC | PRN

## 2021-09-23 MED ORDER — LEVETIRACETAM 500 MG PO TABS
500.0000 mg | ORAL_TABLET | Freq: Two times a day (BID) | ORAL | Status: DC
Start: 2021-09-23 — End: 2021-09-23
  Administered 2021-09-23: 500 mg via ORAL
  Filled 2021-09-23: qty 1

## 2021-09-23 NOTE — Progress Notes (Signed)
Pt with d/c orders home. Assessment and VS complete pt is stable. PIV removed. Discharge packet and education provided to pt all questions answered. Prescription sent to pharmacy pt is aware. Pt and all belongings transported via wheelchair to private vehicle.

## 2021-09-23 NOTE — Progress Notes (Signed)
PT Cancellation Note  Patient Details Name: Daniel Solomon MRN: [id number] DOB: 10/13/1960   Cancelled Treatment:    Reason Eval/Treat Not Completed: Patient at procedure or test/unavailable - peritoneal dialysis  Stacie Glaze, PT DPT Acute Rehabilitation Services Pager [phone]  Office [phone]   Louis Matte 09/23/2021, 12:37 PM

## 2021-09-23 NOTE — Progress Notes (Addendum)
Pt ambulated appox 1000 ft independently in the hallway.

## 2021-09-23 NOTE — Progress Notes (Signed)
Pt is on CCPD, dose 4 exchanges overnight , fill volume is 3000 cc. Presented w/ new onset headaches for 2 wks. PCP sent pt to ED after abnormal MRI findings. Imaging showed 8m SDH. Pt on eliquis for afib at home. Pt admitted and seen by neurosurgery. Today CT changes are unchanged, so reportedly per the pmd pt may be going home and coming back on Tuesday for middle meningeal artery embolization.  He rec'd PD starting early am today and is due to complete the session around 2 pm. Labs and volume are okay. Creat runs high 10-15 which is the same here.  Per the primary MD, pt will be dc'd and will hold his eliquis (afib) and return on Tuesday for the embolization procedure. He does not require any further inpatient PD, will dc PD asap this afternoon. Will notify OP Home therapies to avoid any IP heparin for the next 4 weeks.   RKelly Splinter MD 09/23/2021, 1:28 PM

## 2021-09-23 NOTE — Progress Notes (Signed)
Subjective: Patient reports some headaches still but tolerable   Objective: Vital signs in last 24 hours: Temp:  [97.9 F (36.6 C)-98.2 F (36.8 C)] 98.2 F (36.8 C) (07/27 0317) Pulse Rate:  [76-91] 76 (07/27 0317) Resp:  [11-20] 17 (07/27 0317) BP: (91-144)/(80-94) 128/81 (07/27 0317) SpO2:  [92 %-99 %] 92 % (07/27 0317) Weight:  [96.4 kg] 96.4 kg (07/26 1910)  Intake/Output from previous day: No intake/output data recorded. Intake/Output this shift: No intake/output data recorded.  Neurologic: Grossly normal  Lab Results: Lab Results  Component Value Date   WBC 6.4 09/23/2021   HGB 12.2 (L) 09/23/2021   HCT 36.4 (L) 09/23/2021   MCV 102.8 (H) 09/23/2021   PLT 140 (L) 09/23/2021   Lab Results  Component Value Date   INR 1.3 (H) 09/22/2021   BMET Lab Results  Component Value Date   NA 139 09/23/2021   K 3.5 09/23/2021   CL 99 09/23/2021   CO2 24 09/23/2021   GLUCOSE 101 (H) 09/23/2021   BUN 60 (H) 09/23/2021   CREATININE 14.67 (H) 09/23/2021   CALCIUM 9.8 09/23/2021    Studies/Results: CT HEAD WO CONTRAST (5MM)  Result Date: 09/23/2021 CLINICAL DATA:  61 year old male with headache and left side subdural hematoma. EXAM: CT HEAD WITHOUT CONTRAST TECHNIQUE: Contiguous axial images were obtained from the base of the skull through the vertex without intravenous contrast. RADIATION DOSE REDUCTION: This exam was performed according to the departmental dose-optimization program which includes automated exposure control, adjustment of the mA and/or kV according to patient size and/or use of iterative reconstruction technique. COMPARISON:  Brain MRI 09/22/2021. FINDINGS: Brain: Widespread left side subdural hematoma is mixed density, largely isodense in areas. Hemorrhage is stable an and configuration from the MRI yesterday measuring up to 13 mm in thickness (coronal image 40). Mass effect on the left hemisphere with rightward midline shift of 6-7 mm, stable. Stable mass  effect on the left lateral ventricle. No ventriculomegaly. Basilar cisterns remain patent. No new intracranial hemorrhage identified. Stable gray-white matter differentiation. Small chronic left basal ganglia and cerebellar lacunar infarcts. No cortically based acute infarct identified. Vascular: Calcified atherosclerosis at the skull base. Skull: Intact.  No acute osseous abnormality identified. Sinuses/Orbits: Visualized paranasal sinuses and mastoids are stable and well aerated. Trace right maxillary mucosal thickening. Other: Visualized orbits and scalp soft tissues are within normal limits. IMPRESSION: 1. Hemispheric left subdural hematoma is stable yesterday, approximately 13 mm in thickness with intracranial mass effect including rightward midline shift of 6-7 mm. No skull fracture. 2. No new intracranial abnormality.  Chronic small vessel disease. Electronically Signed   By: Genevie Ann M.D.   On: 09/23/2021 07:16   MR Brain Wo Contrast  Result Date: 09/22/2021 CLINICAL DATA:  Headache, new or worsening. EXAM: MRI HEAD WITHOUT CONTRAST TECHNIQUE: Multiplanar, multiecho pulse sequences of the brain and surrounding structures were obtained without intravenous contrast. COMPARISON:  None FINDINGS: Brain: There is an acute subdural hematoma along the convexity on the left with maximal thickness of 14 mm. There is mass effect on the left hemisphere with left-to-right shift of 6 mm. The dentate nuclei regions of the cerebellum show either dilated perivascular spaces or old infarctions. There could be some calcification in this region. No supra tentorial stroke, intra-axial mass or hydrocephalus. Vascular: Major vessels at the base of the brain show flow. Skull and upper cervical spine: Negative Sinuses/Orbits: Mild mucosal thickening of the right maxillary sinus floor. Orbits negative. Other: None IMPRESSION: Acute left convexity  subdural hematoma running from front to back with maximal thickness of 14 mm. Mass  effect with left-to-right shift of 6 mm. We are trying to retrieve the patient, who was escorted towards the exit. Call report to ordering physician in progress. If the patient can be caught before he leaves, he will be taken to the emergency room. Electronically Signed   By: Nelson Chimes M.D.   On: 09/22/2021 18:03    Assessment/Plan: CT this morning shows left chronic/subacute SDH similar in comparison to MRI yesterday. Therefore, I think he will be a good candidate for MMA embolization with Dr. Kathyrn Sheriff.    LOS: 1 day    Ocie Cornfield [facility] 09/23/2021, 7:47 AM

## 2021-09-23 NOTE — Progress Notes (Signed)
PT Cancellation Note  Patient Details Name: Daniel Solomon MRN: [id number] DOB: 1960-06-03   Cancelled Treatment:    Reason Eval/Treat Not Completed: PT screened, no needs identified, will sign off Per chart review, patient ambulating in hallway independently. No deficits from CT/MRI findings besides headache. No skilled PT needs identified. PT will sign off.   Breelynn Bankert A. Gilford Rile PT, DPT Acute Rehabilitation Services Office [phone]    [name] 09/23/2021, 3:29 PM

## 2021-09-23 NOTE — Progress Notes (Signed)
  NEUROSURGERY PROGRESS NOTE   No issues overnight. Patient with unchanged headache this morning.  He does not complain of any other visual changes, numbness tingling or weakness of the extremities.  He remains ambulatory.    Of note, the patient does have a history of end-stage renal disease on dialysis.  He has a history of coronary artery disease status post stent angioplasty.  He has atrial fibrillation maintained on Eliquis.  EXAM:  BP 110/80 (BP Location: Right Arm)   Pulse 85   Temp 97.7 F (36.5 C) (Oral)   Resp 16   Ht '5\' 8"'$  (1.727 m)   Wt 96.4 kg   SpO2 98%   BMI 32.31 kg/m   Awake, alert, oriented  Speech fluent, appropriate  CN grossly intact  5/5 BUE/BLE   IMPRESSION:  61 y.o. 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  It does appear that he would be a good candidate for middle meningeal artery embolization  PLAN: -  we will plan left MM a embolization this coming Tuesday, September 28, 2021. -  from a neurosurgical standpoint, patient should be stable for discharge to come back in for the procedure on an outpatient basis -  would continue to hold his Eliquis   I have reviewed the imaging findings and treatment options with the patient and his wife at bedside.  We discussed expectant observation, surgical evacuation, and the option for middle meningeal artery embolization.  I did review with them the details of the procedure as well as the expected postoperative course and recovery.  We did also discuss the risks of the procedure to include risk of stroke leading to weakness, paralysis, coma, death, arterial dissection, and hematoma.  All their questions were answered.  Patient does wish to proceed with the embolization procedure.  Consuella Lose, MD [facility] [place]    Consuella Lose, MD [facility]

## 2021-09-23 NOTE — Progress Notes (Signed)
Hemodialysis called to hook pt back to peritoneal dialysis since pt returned from CT.   Provider paged to obtain order for a diet. Diet order placed.

## 2021-09-25 NOTE — Discharge Summary (Addendum)
Physician Discharge Summary   Patient: Daniel Solomon MRN: [id number] DOB: 11/23/1960  Admit date:     09/22/2021  Discharge date: 09/23/2021  Discharge Physician: Berle Mull  PCP: Marin Olp, MD  Recommendations at discharge: Follow-up with neurosurgery as recommended. Do not take Eliquis.  Discharge Diagnoses: Principal Problem:   Subdural hematoma ([place]) ESRD on PD. Hypokalemia Thrombocytopenia Persistent A-fib on Eliquis. Ascending aortic aorta aneurysm. Obesity.  Assessment and Plan: Spontaneous subdural hematoma, POA Headache Denies trauma or uncontrolled hypertension Last dose home Eliquis was this morning 09/22/2021. Neurosurgery was consulted. I repeated CT head was performed.  Which does not show any evidence of acute abnormality. This headache actually has been ongoing for last 1 to 2 weeks. Patient was started on Keppra prophylactically for 7 days. Patient will follow-up with neurosurgery at the clinic for MMA embolization. Currently scheduled. Neurosurgery staff will call the patient for preop recommendation. Patient able to ambulate without any complication. Does not have any focal deficit. Headache actually has resolved. Instructed patient not to take Eliquis, ibuprofen Aleve naproxen. Narcotics provided for headache.   ESRD on PD Nephrology consulted to resume home PD Nephrology will ensure that the patient does not receive any hypotension in his PD fluid for next 1 month.  Hypokalemia Management per nephrology.   Mild thrombocytopenia Platelet count has been on regarding the lower side. Not on medication that can do that. Recommend repeat CBC in 1 week.  If persistent will require further work-up.   Persistent atrial fibrillation, on Eliquis prior to admission Continue to hold off home Eliquis due to intracranial hemorrhage Rate controlled.   Ascending aorta aneurysm measuring 4.1 cm Monitor for now; outpatient  surveillance  Obesity. Body mass index is 32.31 kg/m.  Placing the pt at higher risk of poor outcomes.  Pain control - [employer] Controlled Substance Reporting System database was reviewed. and patient was instructed, not to drive, operate heavy machinery, perform activities at heights, swimming or participation in water activities or provide baby-sitting services while on Pain, Sleep and Anxiety Medications; until their outpatient Physician has advised to do so again. Also recommended to not to take more than prescribed Pain, Sleep and Anxiety Medications.   Consultants: Neurosurgery, nephrology Procedures performed:  PD  DISCHARGE MEDICATION: Allergies as of 09/23/2021   No Known Allergies      Medication List     STOP taking these medications    Eliquis 5 MG Tabs tablet Generic drug: apixaban       TAKE these medications    allopurinol 300 MG tablet Commonly known as: ZYLOPRIM TAKE 1 TABLET BY MOUTH EVERY DAY   amoxicillin 500 MG tablet Commonly known as: AMOXIL Take 4 tablets by mouth 1 hour prior to dental appointments and cleanings What changed:  how much to take how to take this when to take this   atorvastatin 40 MG tablet Commonly known as: LIPITOR TAKE 1 TABLET BY MOUTH EVERY DAY   Auryxia 1 GM 210 MG(Fe) tablet Generic drug: ferric citrate Take 630 mg by mouth 3 (three) times daily with meals.   calcitRIOL 0.25 MCG capsule Commonly known as: ROCALTROL Take 0.25 mcg by mouth daily.   docusate sodium 100 MG capsule Commonly known as: COLACE Take 100 mg by mouth daily.   gentamicin ointment 0.1 % Commonly known as: GARAMYCIN Apply 1 application topically daily.   HYDROcodone-acetaminophen 5-325 MG tablet Commonly known as: NORCO/VICODIN Take 1 tablet by mouth every 6 (six) hours as needed for moderate  pain or severe pain. 1-2 tabs po tid prn What changed:  how much to take how to take this when to take this reasons to take this    iron sucrose in sodium chloride 0.9 % 100 mL Iron Sucrose (Venofer)   labetalol 100 MG tablet Commonly known as: NORMODYNE Take 100 mg by mouth 2 (two) times daily.   levETIRAcetam 500 MG tablet Commonly known as: KEPPRA Take 1 tablet (500 mg total) by mouth 2 (two) times daily for 7 days.   methocarbamol 500 MG tablet Commonly known as: ROBAXIN Take 2 tablets (1,000 mg total) by mouth 4 (four) times daily as needed (for Headache). START with 1 pill, if not too drowsy take 2 pills.   MIRCERA IJ Inject into the skin as needed.   torsemide 100 MG tablet Commonly known as: DEMADEX Take 1 tablet (100 mg total) by mouth daily.        Disposition: Home Diet recommendation: Renal diet  Discharge Exam: Vitals:   09/22/21 2314 09/23/21 0317 09/23/21 0809 09/23/21 1202  BP: 122/81 128/81 108/85 110/80  Pulse: 91 76 76 85  Resp: '17 17 16 16  '$ Temp: 97.9 F (36.6 C) 98.2 F (36.8 C) 97.6 F (36.4 C) 97.7 F (36.5 C)  TempSrc: Oral Oral Oral Oral  SpO2: 96% 92% 98% 98%  Weight:      Height:       General: Appear in no distress; no visible Abnormal Neck Mass Or lumps, Conjunctiva normal Cardiovascular: S1 and S2 Present, no Murmur, Respiratory: good respiratory effort, Bilateral Air entry present and CTA, no Crackles, no wheezes Abdomen: Bowel Sound present, Non tender  Extremities: no Pedal edema Neurology: alert and oriented to time, place, and person  Gait not checked due to patient safety concerns Filed Weights   09/22/21 1910  Weight: 96.4 kg   Condition at discharge: stable  The results of significant diagnostics from this hospitalization (including imaging, microbiology, ancillary and laboratory) are listed below for reference.   Imaging Studies: CT HEAD WO CONTRAST (5MM)  Result Date: 09/23/2021 CLINICAL DATA:  61 year old male with headache and left side subdural hematoma. EXAM: CT HEAD WITHOUT CONTRAST TECHNIQUE: Contiguous axial images were obtained from  the base of the skull through the vertex without intravenous contrast. RADIATION DOSE REDUCTION: This exam was performed according to the departmental dose-optimization program which includes automated exposure control, adjustment of the mA and/or kV according to patient size and/or use of iterative reconstruction technique. COMPARISON:  Brain MRI 09/22/2021. FINDINGS: Brain: Widespread left side subdural hematoma is mixed density, largely isodense in areas. Hemorrhage is stable an and configuration from the MRI yesterday measuring up to 13 mm in thickness (coronal image 40). Mass effect on the left hemisphere with rightward midline shift of 6-7 mm, stable. Stable mass effect on the left lateral ventricle. No ventriculomegaly. Basilar cisterns remain patent. No new intracranial hemorrhage identified. Stable gray-white matter differentiation. Small chronic left basal ganglia and cerebellar lacunar infarcts. No cortically based acute infarct identified. Vascular: Calcified atherosclerosis at the skull base. Skull: Intact.  No acute osseous abnormality identified. Sinuses/Orbits: Visualized paranasal sinuses and mastoids are stable and well aerated. Trace right maxillary mucosal thickening. Other: Visualized orbits and scalp soft tissues are within normal limits. IMPRESSION: 1. Hemispheric left subdural hematoma is stable yesterday, approximately 13 mm in thickness with intracranial mass effect including rightward midline shift of 6-7 mm. No skull fracture. 2. No new intracranial abnormality.  Chronic small vessel disease. Electronically Signed  By: Genevie Ann M.D.   On: 09/23/2021 07:16   MR Brain Wo Contrast  Result Date: 09/22/2021 CLINICAL DATA:  Headache, new or worsening. EXAM: MRI HEAD WITHOUT CONTRAST TECHNIQUE: Multiplanar, multiecho pulse sequences of the brain and surrounding structures were obtained without intravenous contrast. COMPARISON:  None FINDINGS: Brain: There is an acute subdural hematoma along  the convexity on the left with maximal thickness of 14 mm. There is mass effect on the left hemisphere with left-to-right shift of 6 mm. The dentate nuclei regions of the cerebellum show either dilated perivascular spaces or old infarctions. There could be some calcification in this region. No supra tentorial stroke, intra-axial mass or hydrocephalus. Vascular: Major vessels at the base of the brain show flow. Skull and upper cervical spine: Negative Sinuses/Orbits: Mild mucosal thickening of the right maxillary sinus floor. Orbits negative. Other: None IMPRESSION: Acute left convexity subdural hematoma running from front to back with maximal thickness of 14 mm. Mass effect with left-to-right shift of 6 mm. We are trying to retrieve the patient, who was escorted towards the exit. Call report to ordering physician in progress. If the patient can be caught before he leaves, he will be taken to the emergency room. Electronically Signed   By: Nelson Chimes M.D.   On: 09/22/2021 18:03    Microbiology: Results for orders placed or performed during the hospital encounter of 01/15/21  SARS CORONAVIRUS 2 (TAT 6-24 HRS) Nasopharyngeal Nasopharyngeal Swab     Status: None   Collection Time: 01/15/21  9:27 AM   Specimen: Nasopharyngeal Swab  Result Value Ref Range Status   SARS Coronavirus 2 NEGATIVE NEGATIVE Final    Comment: (NOTE) SARS-CoV-2 target nucleic acids are NOT DETECTED.  The SARS-CoV-2 RNA is generally detectable in upper and lower respiratory specimens during the acute phase of infection. Negative results do not preclude SARS-CoV-2 infection, do not rule out co-infections with other pathogens, and should not be used as the sole basis for treatment or other patient management decisions. Negative results must be combined with clinical observations, patient history, and epidemiological information. The expected result is Negative.  Fact Sheet for  Patients: SugarRoll.be  Fact Sheet for Healthcare Providers: https://www.woods-mathews.com/  This test is not yet approved or cleared by the [place] FDA and  has been authorized for detection and/or diagnosis of SARS-CoV-2 by FDA under an Emergency Use Authorization (EUA). This EUA will remain  in effect (meaning this test can be used) for the duration of the COVID-19 declaration under Se ction 564(b)(1) of the Act, 21 U.S.C. section 360bbb-3(b)(1), unless the authorization is terminated or revoked sooner.  Performed at [facility], [place] [address], [place], [place] [address]   Surgical pcr screen     Status: None   Collection Time: 01/15/21  9:27 AM   Specimen: Nasal Mucosa; Nasal Swab  Result Value Ref Range Status   MRSA, PCR NEGATIVE NEGATIVE Final   Staphylococcus aureus NEGATIVE NEGATIVE Final    Comment: (NOTE) The Xpert SA Assay (FDA approved for NASAL specimens in patients 70 years of age and older), is one component of a comprehensive surveillance program. It is not intended to diagnose infection nor to guide or monitor treatment. Performed at [facility], [place] [address] [place], [place] [address]     Labs: CBC: Recent Labs  Lab 09/22/21 1849 09/23/21 0641  WBC 7.0 6.4  NEUTROABS 4.3 4.0  HGB 12.5* 12.2*  HCT 36.2* 36.4*  MCV 105.5* 102.8*  PLT 138* 947*   Basic Metabolic Panel: Recent Labs  Lab 09/22/21 1849 09/23/21 0641  NA 136 139  K 3.4* 3.5  CL 94* 99  CO2 25 24  GLUCOSE 116* 101*  BUN 60* 60*  CREATININE 14.97* 14.67*  CALCIUM 9.9 9.8  MG  --  2.6*  PHOS  --  5.6*   Liver Function Tests: Recent Labs  Lab 09/22/21 1849 09/23/21 0641  AST 21 21  ALT 41 38  ALKPHOS 82 81  BILITOT 0.6 0.6  PROT 6.8 7.1  ALBUMIN 3.6 3.4*   CBG: No results for input(s): "GLUCAP" in the last 168 hours.  Discharge time spent: greater than 30 minutes.  Signed: Berle Mull, MD [facility] 09/23/2021

## 2021-09-27 ENCOUNTER — Encounter (HOSPITAL_COMMUNITY): Payer: Self-pay | Admitting: Neurosurgery

## 2021-09-27 NOTE — Anesthesia Preprocedure Evaluation (Addendum)
Anesthesia Evaluation  Patient identified by MRN, date of birth, ID band Patient awake    Reviewed: Allergy & Precautions, H&P , NPO status , Patient's Chart, lab work & pertinent test results, reviewed documented beta blocker date and time   Airway Mallampati: II  TM Distance: >3 FB Neck ROM: Full    Dental no notable dental hx. (+) Teeth Intact, Dental Advisory Given   Pulmonary neg pulmonary ROS, sleep apnea ,    Pulmonary exam normal breath sounds clear to auscultation       Cardiovascular hypertension, Pt. on medications and Pt. on home beta blockers + CAD and + Cardiac Stents  + dysrhythmias Atrial Fibrillation  Rhythm:Regular Rate:Normal  S/p TAVR   Neuro/Psych  Headaches, negative neurological ROS  negative psych ROS   GI/Hepatic negative GI ROS, Neg liver ROS,   Endo/Other  negative endocrine ROS  Renal/GU ESRF and DialysisRenal diseasenegative Renal ROS  negative genitourinary   Musculoskeletal   Abdominal   Peds  Hematology negative hematology ROS (+) Blood dyscrasia, anemia ,   Anesthesia Other Findings   Reproductive/Obstetrics negative OB ROS                           Anesthesia Physical Anesthesia Plan  ASA: 3  Anesthesia Plan: General   Post-op Pain Management: Ofirmev IV (intra-op)*   Induction: Intravenous  PONV Risk Score and Plan: 3 and Ondansetron, Dexamethasone and Treatment may vary due to age or medical condition  Airway Management Planned: Oral ETT  Additional Equipment: Arterial line  Intra-op Plan:   Post-operative Plan: Extubation in OR  Informed Consent: I have reviewed the patients History and Physical, chart, labs and discussed the procedure including the risks, benefits and alternatives for the proposed anesthesia with the patient or authorized representative who has indicated his/her understanding and acceptance.     Dental advisory  given  Plan Discussed with: CRNA  Anesthesia Plan Comments: (PAT note written 09/27/2021 by Shonna Chock, PA-C. )       Anesthesia Quick Evaluation

## 2021-09-27 NOTE — Progress Notes (Signed)
Anesthesia Chart Review:  Case: [id number] Date/Time: 09/28/21 1515   Procedure: Middle meningeal artery embolization   Anesthesia type: General   Pre-op diagnosis: S06.5  aneursym   Location: [facility] OR ROOM 21 / [place] [facility]   Surgeons: Consuella Lose, MD       DISCUSSION: Patient is a 61 year old male scheduled for the above procedure.  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  Patient evaluated at primary care office on 09/06/21 and later on 09/09/21 for headache x 5 days, some N/V. A little relief with Vicodin. Prescribed Robaxin and if no improvement would consider brain imaging. Headache persisted and out-patient brain MRI on 09/22/21 showed acute left SDH measuring 153min size with some mass effect and midline shift. He was sent to the ED for admission and neurosurgery consultation. Eliquis held, but given stable follow-up imaging, neurosurgery did not feel he needed urgent surgical intervention but rather scheduled for out-patient middle meningeal artery embolization with Dr. NuKathyrn Sheriffn 09/28/21. He was discharged home on 09/23/21 with instructions to hold Eliquis until his procedure. Keppra started. Nephrology consulted for management for ESRD while hospitalized.  Last cardiology visit was on 08/25/21 with Dr. McAngelena FormOn Eliquis alone (not ASA) for persistent afib, TAVR. SBE prophylaxis as indicated. No b-blocker due to bradycardia. One year follow-up recommended.  Reported last Eliquis dose 09/22/21. Anesthesia team to evaluate on the day of surgery.    VS:  BP Readings from Last 3 Encounters:  09/23/21 110/80  09/09/21  126/82  09/06/21 124/70   Pulse Readings from Last 3 Encounters:  09/23/21 85  09/09/21 86  09/06/21 83     PROVIDERS: HuMarin OlpMD is PCP  McLauree ChandlerMD is structural heart cardiologist SoChesley MiresMD is pulmonologist (OSA) SaPearson GrippeMD is nephrologist [facility]  LABS: Labs on arrival as indicated (ESRD). Most recent lab results include: Lab Results  Component Value Date   WBC 6.4 09/23/2021   HGB 12.2 (L) 09/23/2021   HCT 36.4 (L) 09/23/2021   PLT 140 (L) 09/23/2021   GLUCOSE 101 (H) 09/23/2021   ALT 38 09/23/2021   AST 21 09/23/2021   NA 139 09/23/2021   K 3.5 09/23/2021   CL 99 09/23/2021   CREATININE 14.67 (H) 09/23/2021   BUN 60 (H) 09/23/2021   CO2 24 09/23/2021   TSH 2.18 10/24/2018   PSA 0.92 10/24/2018   INR 1.3 (H) 09/22/2021   HGBA1C 5.1 01/12/2016     IMAGES: CT Head 09/23/21: IMPRESSION: 1. Hemispheric left subdural hematoma is stable yesterday, approximately 13 mm in thickness with intracranial mass effect including rightward midline shift of 6-7 mm. No skull fracture. 2. No new intracranial abnormality.  Chronic small vessel disease.   MRI Brain 09/22/21: IMPRESSION: Acute left convexity subdural hematoma running from front to back with maximal thickness of 14 mm. Mass effect with left-to-right shift of 6 mm.   CXR 01/15/21: FINDINGS: Heart size within normal limits. Atherosclerotic calcification of the aortic knob. No focal airspace consolidation, pleural effusion, or pneumothorax. No acute bony findings. IMPRESSION: No active cardiopulmonary disease.   EKG: 09/22/21: Atrial fibrillation  at 85 bpm Minimal ST depression, diffuse leads Borderline prolonged QT interval Confirmed by Dory Horn) on 09/23/2021 10:26:48 AM   CV: Echo 02/18/21: IMPRESSIONS   1. DVI 0.45. Peak gradient 27 mmHg. EOA 1.83 cm2. The aortic  valve has  been replaced by a 26 mm Sapien valve. There is trivial PVL only seen in  the PLAX view. Aortic valve mean gradient measures 18.0 mmHg. Aortic valve  Vmax measures 2.47 m/s. Aortic  valve acceleration time measures 67 msec.   2. Left ventricular ejection fraction, by estimation, is 60 to 65%. The  left ventricle has normal function. The left ventricle has no regional  wall motion abnormalities. There is mild concentric left ventricular  hypertrophy. Left ventricular diastolic  parameters are indeterminate.   3. Right ventricular systolic function is normal. The right ventricular  size is normal. There is normal pulmonary artery systolic pressure.   4. Left atrial size was severely dilated.   5. The mitral valve is abnormal. Trivial mitral valve regurgitation. No  evidence of mitral stenosis.   6. Aortic dilatation noted. There is mild dilatation of the ascending  aorta, measuring 41 mm.   7. The inferior vena cava is normal in size with greater than 50%  respiratory variability, suggesting right atrial pressure of 3 mmHg.  - Comparison(s): In the setting of atrial fibrillation and beat to beat  variation, likely prosthetic gradients from prior.    RHC/LHC 12/04/20:   Prox LAD to Mid LAD lesion is 50% stenosed.   Mid LAD lesion is 50% stenosed.   Dist LAD-1 lesion is 20% stenosed.   Dist LAD-2 lesion is 50% stenosed.   1st Mrg lesion is 30% stenosed.   Prox Cx to Mid Cx lesion is 40% stenosed.   Dist RCA lesion is 70% stenosed. Moderate diffuse LAD disease. Patent mid to distal LAD stent Mild mid Circumflex disease Large dominant RCA with moderately severe distal stenosis at the bifurcation into the PDA and posterolateral artery.    Recommendations: Will continue workup for AVR vs TAVR. Will arrange CT scans next. His distal RCA stenosis could be addressed with PCI but given his lack of angina or ischemic symptoms this would be best managed medically. Given his young age,  will still need to consider surgical AVR as well. Further planning will be based on his CT scans.     Cardiac event monitor 01/28/16: Sinus rhythm with premature atrial contractions Atrial fibrillation with controlled heart rate     US Carotid 01/28/16: Impressions: Heterogeneous plaque, bilaterally. 1 to 39% bilateral ICA stenosis. Normal subclavian arteries bilaterally. Patent bilateral vertebral arteries with antegrade flow, with abnormal waveforms in the right vertebral artery.   Past Medical History:  Diagnosis Date   Anemia    Bicuspid aortic valve    Blood transfusion without reported diagnosis    CAD (coronary artery disease)    proximal LAD 50%, mid LAD 50-60%, mid to distal LAD 99%; mid circumflex 20%; proximal RCA 20%, distal RCA 50%; PDA 60-70%, proximal posterior AV groove 50%, proximal posterior lateral 50%.  There was a question of possible LVOT gradient;  s/p Resolute DES to mid-distal LAD 10/2010;  echocardiogram 11/17/10: EF 55-60%, mild LVH, grade 1 diastolic dysfunction, normal aortic valve, mild MR, PASP 32.    Chicken pox    Enlarged aorta ([name])    ESRD on peritoneal dialysis ([name])    Glaucoma    Gout    allopurinol '300mg'$ , colchicine prn in past, has not  had flares   Headache    History of kidney stones    Hypercholesterolemia    With hypertriglyceridemia   Hypertension    Mild aortic stenosis    PAF (paroxysmal atrial fibrillation) ([place])    a. Documented on event monitor 01/2016.   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   SDH (subdural hematoma) ([place]) 08/2021   left   Sleep apnea    cpap nightly    Past Surgical History:  Procedure Laterality Date   CORONARY STENT PLACEMENT  2012   CYSTOSCOPY/RETROGRADE/URETEROSCOPY  03/26/2012   Procedure: CYSTOSCOPY/RETROGRADE/URETEROSCOPY;  Surgeon: [name], MD;   Location: [facility];  Service: Urology;  Laterality: Bilateral;  CYSTOSCOPY BILATERAL RETROGRADE PYELOGRAM AND POSSIBLE URETEROSCOPY   INTRAOPERATIVE TRANSTHORACIC ECHOCARDIOGRAM N/A 01/19/2021   Procedure: INTRAOPERATIVE TRANSTHORACIC ECHOCARDIOGRAM;  Surgeon: Burnell Blanks, MD;  Location: [place] [place];  Service: Open Heart Surgery;  Laterality: N/A;   RIGHT HEART CATH AND CORONARY ANGIOGRAPHY N/A 12/04/2020   Procedure: RIGHT HEART CATH AND CORONARY ANGIOGRAPHY;  Surgeon: Burnell Blanks, MD;  Location: [facility];  Service: Cardiovascular;  Laterality: N/A;   TRANSCATHETER AORTIC VALVE REPLACEMENT, TRANSFEMORAL N/A 01/19/2021   Procedure: TRANSCATHETER AORTIC VALVE REPLACEMENT, TRANSFEMORAL USING A 26MM EDWARDS VALVE.;  Surgeon: Burnell Blanks, MD;  Location: [place];  Service: Open Heart Surgery;  Laterality: N/A;    MEDICATIONS: No current facility-administered medications for this encounter.    allopurinol (ZYLOPRIM) 300 MG tablet   amoxicillin (AMOXIL) 500 MG tablet   atorvastatin (LIPITOR) 40 MG tablet   AURYXIA 1 GM 210 MG(Fe) tablet   calcitRIOL (ROCALTROL) 0.25 MCG capsule   docusate sodium (COLACE) 100 MG capsule   gentamicin ointment (GARAMYCIN) 0.1 %   HYDROcodone-acetaminophen (NORCO/VICODIN) 5-325 MG tablet   labetalol (NORMODYNE) 100 MG tablet   levETIRAcetam (KEPPRA) 500 MG tablet   torsemide (DEMADEX) 100 MG tablet   ELIQUIS 5 MG TABS tablet   iron sucrose in sodium chloride 0.9 % 100 mL   methocarbamol (ROBAXIN) 500 MG tablet   Methoxy PEG-Epoetin Beta (MIRCERA IJ)    Myra Gianotti, PA-C Surgical Short Stay/Anesthesiology [facility] Phone [phone] [facility] Phone [phone] 09/27/2021 12:21 PM

## 2021-09-27 NOTE — Progress Notes (Signed)
Spoke with pt for pre-op call. Pt has hx of HTN, A-fib (on Eliquis) and CKD and on peritoneal dialysis, instructed pt to come in "empty" tomorrow. Pt voiced understanding. Had been having headaches for 2 weeks, in the ED on 09/22/21 and found to have a subdural hematoma. Pt discharged on the 27th. His last dose of Eliquis was 09/22/21. Pt states he is not diabetic.   Shower instructions given to pt and he voiced understanding.

## 2021-09-28 ENCOUNTER — Encounter (HOSPITAL_COMMUNITY): Payer: Self-pay | Admitting: Neurosurgery

## 2021-09-28 ENCOUNTER — Other Ambulatory Visit (HOSPITAL_COMMUNITY): Payer: Self-pay

## 2021-09-28 ENCOUNTER — Inpatient Hospital Stay (HOSPITAL_COMMUNITY)
Admission: RE | Admit: 2021-09-28 | Discharge: 2021-09-28 | Disposition: A | Discharge status: Home / Self Care | Payer: Managed Care, Other (non HMO) | Source: Home / Self Care | Attending: Neurosurgery | Admitting: Neurosurgery

## 2021-09-28 ENCOUNTER — Inpatient Hospital Stay (HOSPITAL_COMMUNITY): Payer: Managed Care, Other (non HMO) | Admitting: Certified Registered"

## 2021-09-28 ENCOUNTER — Other Ambulatory Visit (HOSPITAL_COMMUNITY): Payer: Managed Care, Other (non HMO)

## 2021-09-28 ENCOUNTER — Other Ambulatory Visit: Payer: Self-pay | Admitting: Neurosurgery

## 2021-09-28 ENCOUNTER — Encounter (HOSPITAL_COMMUNITY)
Admission: RE | Disposition: A | Discharge status: Home / Self Care | Payer: Self-pay | Source: Home / Self Care | Attending: Neurosurgery

## 2021-09-28 ENCOUNTER — Inpatient Hospital Stay (HOSPITAL_COMMUNITY)
Admission: RE | Admit: 2021-09-28 | Discharge: 2021-09-29 | DRG: 020 | Disposition: A | Discharge status: Home / Self Care | Payer: Managed Care, Other (non HMO) | Attending: Neurosurgery | Admitting: Neurosurgery

## 2021-09-28 ENCOUNTER — Encounter (HOSPITAL_COMMUNITY): Payer: Self-pay

## 2021-09-28 DIAGNOSIS — N186 End stage renal disease: Secondary | ICD-10-CM | POA: Diagnosis present

## 2021-09-28 DIAGNOSIS — Z992 Dependence on renal dialysis: Secondary | ICD-10-CM

## 2021-09-28 DIAGNOSIS — I2510 Atherosclerotic heart disease of native coronary artery without angina pectoris: Secondary | ICD-10-CM | POA: Diagnosis present

## 2021-09-28 DIAGNOSIS — I671 Cerebral aneurysm, nonruptured: Secondary | ICD-10-CM | POA: Diagnosis present

## 2021-09-28 DIAGNOSIS — Z955 Presence of coronary angioplasty implant and graft: Secondary | ICD-10-CM | POA: Diagnosis not present

## 2021-09-28 DIAGNOSIS — N2581 Secondary hyperparathyroidism of renal origin: Secondary | ICD-10-CM | POA: Diagnosis present

## 2021-09-28 DIAGNOSIS — S065XAA Traumatic subdural hemorrhage with loss of consciousness status unknown, initial encounter: Secondary | ICD-10-CM | POA: Diagnosis present

## 2021-09-28 DIAGNOSIS — D631 Anemia in chronic kidney disease: Secondary | ICD-10-CM | POA: Diagnosis present

## 2021-09-28 DIAGNOSIS — Z7901 Long term (current) use of anticoagulants: Secondary | ICD-10-CM | POA: Diagnosis not present

## 2021-09-28 DIAGNOSIS — H409 Unspecified glaucoma: Secondary | ICD-10-CM | POA: Diagnosis present

## 2021-09-28 DIAGNOSIS — M109 Gout, unspecified: Secondary | ICD-10-CM | POA: Diagnosis present

## 2021-09-28 DIAGNOSIS — Z79899 Other long term (current) drug therapy: Secondary | ICD-10-CM | POA: Diagnosis not present

## 2021-09-28 DIAGNOSIS — Z87442 Personal history of urinary calculi: Secondary | ICD-10-CM

## 2021-09-28 DIAGNOSIS — I120 Hypertensive chronic kidney disease with stage 5 chronic kidney disease or end stage renal disease: Secondary | ICD-10-CM | POA: Diagnosis present

## 2021-09-28 DIAGNOSIS — Z841 Family history of disorders of kidney and ureter: Secondary | ICD-10-CM | POA: Diagnosis not present

## 2021-09-28 DIAGNOSIS — Z8249 Family history of ischemic heart disease and other diseases of the circulatory system: Secondary | ICD-10-CM

## 2021-09-28 DIAGNOSIS — I6203 Nontraumatic chronic subdural hemorrhage: Principal | ICD-10-CM | POA: Diagnosis present

## 2021-09-28 DIAGNOSIS — D499 Neoplasm of unspecified behavior of unspecified site: Secondary | ICD-10-CM

## 2021-09-28 DIAGNOSIS — Z953 Presence of xenogenic heart valve: Secondary | ICD-10-CM | POA: Diagnosis not present

## 2021-09-28 DIAGNOSIS — G4733 Obstructive sleep apnea (adult) (pediatric): Secondary | ICD-10-CM | POA: Diagnosis present

## 2021-09-28 DIAGNOSIS — E7800 Pure hypercholesterolemia, unspecified: Secondary | ICD-10-CM | POA: Diagnosis present

## 2021-09-28 DIAGNOSIS — I4819 Other persistent atrial fibrillation: Secondary | ICD-10-CM | POA: Diagnosis present

## 2021-09-28 DIAGNOSIS — Z8679 Personal history of other diseases of the circulatory system: Secondary | ICD-10-CM | POA: Diagnosis present

## 2021-09-28 HISTORY — PX: IR ANGIO EXTERNAL CAROTID SEL EXT CAROTID UNI L MOD SED: IMG5370

## 2021-09-28 HISTORY — PX: IR ANGIO INTRA EXTRACRAN SEL INTERNAL CAROTID UNI L MOD SED: IMG5361

## 2021-09-28 HISTORY — PX: IR ANGIOGRAM FOLLOW UP STUDY: IMG697

## 2021-09-28 HISTORY — PX: IR NEURO EACH ADD'L AFTER BASIC UNI LEFT (MS): IMG5373

## 2021-09-28 HISTORY — PX: IR TRANSCATH/EMBOLIZ: IMG695

## 2021-09-28 HISTORY — DX: Headache, unspecified: R51.9

## 2021-09-28 HISTORY — DX: Personal history of urinary calculi: Z87.442

## 2021-09-28 HISTORY — PX: RADIOLOGY WITH ANESTHESIA: SHX6223

## 2021-09-28 HISTORY — PX: IR ANGIOGRAM SELECTIVE EACH ADDITIONAL VESSEL: IMG667

## 2021-09-28 LAB — POCT I-STAT, CHEM 8
BUN: 76 mg/dL — ABNORMAL HIGH (ref 6–20)
Calcium, Ion: 1.17 mmol/L (ref 1.15–1.40)
Chloride: 98 mmol/L (ref 98–111)
Creatinine, Ser: >18 mg/dL — ABNORMAL HIGH (ref 0.61–1.24)
Glucose, Bld: 76 mg/dL (ref 70–99)
HCT: 43 % (ref 39.0–52.0)
Hemoglobin: 14.6 g/dL (ref 13.0–17.0)
Potassium: 4 mmol/L (ref 3.5–5.1)
Sodium: 137 mmol/L (ref 135–145)
TCO2: 26 mmol/L (ref 22–32)

## 2021-09-28 LAB — MRSA NEXT GEN BY PCR, NASAL: MRSA by PCR Next Gen: NOT DETECTED

## 2021-09-28 SURGERY — IR WITH ANESTHESIA
Anesthesia: General

## 2021-09-28 MED ORDER — TORSEMIDE 100 MG PO TABS
100.0000 mg | ORAL_TABLET | Freq: Every day | ORAL | Status: DC
  Administered 2021-09-29: 100 mg via ORAL
  Filled 2021-09-28: qty 1

## 2021-09-28 MED ORDER — SODIUM CHLORIDE 0.9 % IV SOLN: INTRAVENOUS | Status: DC

## 2021-09-28 MED ORDER — ONDANSETRON HCL 4 MG/2ML IJ SOLN
4.0000 mg | INTRAMUSCULAR | Status: DC | PRN
Start: 2021-09-28 — End: 2021-09-29

## 2021-09-28 MED ORDER — LIDOCAINE 2% (20 MG/ML) 5 ML SYRINGE
INTRAMUSCULAR | Status: DC | PRN
  Administered 2021-09-28: 60 mg via INTRAVENOUS

## 2021-09-28 MED ORDER — SODIUM CHLORIDE 0.9 % IV SOLN: INTRAVENOUS | Status: DC | PRN

## 2021-09-28 MED ORDER — PHENYLEPHRINE HCL-NACL 20-0.9 MG/250ML-% IV SOLN
INTRAVENOUS | Status: DC | PRN
  Administered 2021-09-28: 50 ug/min via INTRAVENOUS

## 2021-09-28 MED ORDER — DELFLEX-LC/4.25% DEXTROSE 483 MOSM/L IP SOLN: INTRAPERITONEAL | Status: DC

## 2021-09-28 MED ORDER — ROCURONIUM BROMIDE 10 MG/ML (PF) SYRINGE
PREFILLED_SYRINGE | INTRAVENOUS | Status: DC | PRN
  Administered 2021-09-28: 90 mg via INTRAVENOUS

## 2021-09-28 MED ORDER — CALCITRIOL 0.25 MCG PO CAPS
0.2500 ug | ORAL_CAPSULE | Freq: Every day | ORAL | Status: DC
  Administered 2021-09-29: 0.25 ug via ORAL
  Filled 2021-09-28: qty 1

## 2021-09-28 MED ORDER — MIDAZOLAM HCL 2 MG/2ML IJ SOLN
INTRAMUSCULAR | Status: AC
  Filled 2021-09-28: qty 2

## 2021-09-28 MED ORDER — ATORVASTATIN CALCIUM 40 MG PO TABS
40.0000 mg | ORAL_TABLET | Freq: Every day | ORAL | Status: DC
  Administered 2021-09-29: 40 mg via ORAL
  Filled 2021-09-28: qty 1

## 2021-09-28 MED ORDER — DOCUSATE SODIUM 100 MG PO CAPS
100.0000 mg | ORAL_CAPSULE | Freq: Every day | ORAL | Status: DC
  Administered 2021-09-28 – 2021-09-29 (×2): 100 mg via ORAL
  Filled 2021-09-28 (×2): qty 1

## 2021-09-28 MED ORDER — ONDANSETRON HCL 4 MG PO TABS
4.0000 mg | ORAL_TABLET | ORAL | Status: DC | PRN
Start: 2021-09-28 — End: 2021-09-29

## 2021-09-28 MED ORDER — ALLOPURINOL 300 MG PO TABS
300.0000 mg | ORAL_TABLET | Freq: Every day | ORAL | Status: DC
  Administered 2021-09-29: 300 mg via ORAL
  Filled 2021-09-28: qty 1

## 2021-09-28 MED ORDER — DEXAMETHASONE SODIUM PHOSPHATE 10 MG/ML IJ SOLN
INTRAMUSCULAR | Status: DC | PRN
  Administered 2021-09-28: 4 mg via INTRAVENOUS

## 2021-09-28 MED ORDER — HEPARIN SODIUM (PORCINE) 1000 UNIT/ML IJ SOLN
INTRAMUSCULAR | Status: DC | PRN
  Administered 2021-09-28: 2000 [IU] via INTRAVENOUS

## 2021-09-28 MED ORDER — CHLORHEXIDINE GLUCONATE 0.12 % MT SOLN
15.0000 mL | OROMUCOSAL | Status: AC
  Administered 2021-09-28: 15 mL via OROMUCOSAL
  Filled 2021-09-28: qty 15

## 2021-09-28 MED ORDER — FERRIC CITRATE 1 GM 210 MG(FE) PO TABS
630.0000 mg | ORAL_TABLET | Freq: Three times a day (TID) | ORAL | Status: DC
  Filled 2021-09-28 (×2): qty 3

## 2021-09-28 MED ORDER — PHENYLEPHRINE 80 MCG/ML (10ML) SYRINGE FOR IV PUSH (FOR BLOOD PRESSURE SUPPORT)
PREFILLED_SYRINGE | INTRAVENOUS | Status: DC | PRN
  Administered 2021-09-28: 80 ug via INTRAVENOUS

## 2021-09-28 MED ORDER — IOHEXOL 300 MG/ML  SOLN
100.0000 mL | Freq: Once | INTRAMUSCULAR | Status: AC | PRN
  Administered 2021-09-28: 60 mL via INTRA_ARTERIAL

## 2021-09-28 MED ORDER — HYDROCODONE-ACETAMINOPHEN 5-325 MG PO TABS: 1.0000 | ORAL_TABLET | Freq: Four times a day (QID) | ORAL | Status: DC | PRN

## 2021-09-28 MED ORDER — SUGAMMADEX SODIUM 200 MG/2ML IV SOLN
INTRAVENOUS | Status: DC | PRN
  Administered 2021-09-28: 200 mg via INTRAVENOUS

## 2021-09-28 MED ORDER — LABETALOL HCL 5 MG/ML IV SOLN
INTRAVENOUS | Status: DC | PRN
  Administered 2021-09-28: 10 mg via INTRAVENOUS

## 2021-09-28 MED ORDER — HYDRALAZINE HCL 20 MG/ML IJ SOLN: 10.0000 mg | INTRAMUSCULAR | Status: DC | PRN

## 2021-09-28 MED ORDER — ONDANSETRON HCL 4 MG/2ML IJ SOLN
INTRAMUSCULAR | Status: DC | PRN
  Administered 2021-09-28: 4 mg via INTRAVENOUS

## 2021-09-28 MED ORDER — GENTAMICIN SULFATE 0.1 % EX CREA
1.0000 | TOPICAL_CREAM | Freq: Every day | CUTANEOUS | Status: DC
  Filled 2021-09-28: qty 15

## 2021-09-28 MED ORDER — MIDAZOLAM HCL 2 MG/2ML IJ SOLN
INTRAMUSCULAR | Status: DC | PRN
  Administered 2021-09-28: 2 mg via INTRAVENOUS

## 2021-09-28 MED ORDER — LEVETIRACETAM 500 MG PO TABS
500.0000 mg | ORAL_TABLET | Freq: Two times a day (BID) | ORAL | Status: DC
  Administered 2021-09-28 – 2021-09-29 (×2): 500 mg via ORAL
  Filled 2021-09-28 (×2): qty 1

## 2021-09-28 MED ORDER — FENTANYL CITRATE (PF) 250 MCG/5ML IJ SOLN
INTRAMUSCULAR | Status: AC
  Filled 2021-09-28: qty 5

## 2021-09-28 MED ORDER — DELFLEX-LC/2.5% DEXTROSE 394 MOSM/L IP SOLN: INTRAPERITONEAL | Status: DC

## 2021-09-28 MED ORDER — ORAL CARE MOUTH RINSE: 15.0000 mL | OROMUCOSAL | Status: DC | PRN

## 2021-09-28 MED ORDER — FENTANYL CITRATE (PF) 250 MCG/5ML IJ SOLN
INTRAMUSCULAR | Status: DC | PRN
  Administered 2021-09-28: 100 ug via INTRAVENOUS
  Administered 2021-09-28: 50 ug via INTRAVENOUS

## 2021-09-28 MED ORDER — CHLORHEXIDINE GLUCONATE CLOTH 2 % EX PADS
6.0000 | MEDICATED_PAD | Freq: Every day | CUTANEOUS | Status: DC
  Administered 2021-09-28 – 2021-09-29 (×2): 6 via TOPICAL

## 2021-09-28 MED ORDER — LABETALOL HCL 100 MG PO TABS
100.0000 mg | ORAL_TABLET | Freq: Two times a day (BID) | ORAL | Status: DC
  Administered 2021-09-28 – 2021-09-29 (×2): 100 mg via ORAL
  Filled 2021-09-28 (×2): qty 1

## 2021-09-28 MED ORDER — PROPOFOL 10 MG/ML IV BOLUS
INTRAVENOUS | Status: DC | PRN
  Administered 2021-09-28: 140 mg via INTRAVENOUS
  Administered 2021-09-28: 40 mg via INTRAVENOUS
  Administered 2021-09-28: 50 mg via INTRAVENOUS

## 2021-09-28 MED ORDER — DELFLEX-LC/1.5% DEXTROSE 344 MOSM/L IP SOLN: INTRAPERITONEAL | Status: DC

## 2021-09-28 NOTE — Anesthesia Procedure Notes (Signed)
Arterial Line Insertion Start/End8/02/2021 2:50 PM, 09/28/2021 3:05 PM Performed by: Suzette Battiest, MD, Anastasio Auerbach, CRNA, CRNA  Patient location: Pre-op. Preanesthetic checklist: patient identified, IV checked, site marked, risks and benefits discussed, surgical consent, monitors and equipment checked, pre-op evaluation, timeout performed and anesthesia consent Lidocaine 1% used for infiltration Left, radial was placed Catheter size: 20 G Hand hygiene performed  and Seldinger technique used Allen's test indicative of satisfactory collateral circulation Attempts: 2 Procedure performed without using ultrasound guided technique. Following insertion, Biopatch and dressing applied. Post procedure assessment: normal  Post procedure complications: unsuccessful attempts. Patient tolerated the procedure well with no immediate complications.

## 2021-09-28 NOTE — H&P (Signed)
Chief Complaint   Headache  History of Present Illness  Daniel Solomon is a 61 y.o. male initially presented to the hospital last week through the emergency department after several days of progressively worsening headache.  He did not have any visual changes or new numbness tingling or weakness of the extremities.  His work-up included CT scan and MRI demonstrating a chronic left-sided hemispheric subdural hematoma.  Of note, the patient does have end-stage renal disease on dialysis and is maintained on Eliquis for atrial fibrillation.  During his admission his anticoagulation was discontinued and he was scheduled electively for middle meningeal artery embolization.  Past Medical History   Past Medical History:  Diagnosis Date   Anemia    Bicuspid aortic valve    Blood transfusion without reported diagnosis    CAD (coronary artery disease)    proximal LAD 50%, mid LAD 50-60%, mid to distal LAD 99%; mid circumflex 20%; proximal RCA 20%, distal RCA 50%; PDA 60-70%, proximal posterior AV groove 50%, proximal posterior lateral 50%.  There was a question of possible LVOT gradient;  s/p Resolute DES to mid-distal LAD 10/2010;  echocardiogram 11/17/10: EF 55-60%, mild LVH, grade 1 diastolic dysfunction, normal aortic valve, mild MR, PASP 32.    Chicken pox    Enlarged aorta ([name])    ESRD on peritoneal dialysis ([name])    Glaucoma    Gout    allopurinol '300mg'$ , colchicine prn in past, has not had flares   Headache    History of kidney stones    Hypercholesterolemia    With hypertriglyceridemia   Hypertension    Mild aortic stenosis    PAF (paroxysmal atrial fibrillation) ([place])    a. Documented on event monitor 01/2016.   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   SDH (subdural hematoma) ([place]) 08/2021   left   Sleep apnea    cpap nightly    Past  Surgical History   Past Surgical History:  Procedure Laterality Date   CORONARY STENT PLACEMENT  2012   CYSTOSCOPY/RETROGRADE/URETEROSCOPY  03/26/2012   Procedure: CYSTOSCOPY/RETROGRADE/URETEROSCOPY;  Surgeon: [name], MD;  Location: [facility];  Service: Urology;  Laterality: Bilateral;  CYSTOSCOPY BILATERAL RETROGRADE PYELOGRAM AND POSSIBLE URETEROSCOPY   INTRAOPERATIVE TRANSTHORACIC ECHOCARDIOGRAM N/A 01/19/2021   Procedure: INTRAOPERATIVE TRANSTHORACIC ECHOCARDIOGRAM;  Surgeon: Burnell Blanks, MD;  Location: [place];  Service: Open Heart Surgery;  Laterality: N/A;   RIGHT HEART CATH AND CORONARY ANGIOGRAPHY N/A 12/04/2020   Procedure: RIGHT HEART CATH AND CORONARY ANGIOGRAPHY;  Surgeon: Burnell Blanks, MD;  Location: [place] [facility];  Service: Cardiovascular;  Laterality: N/A;   TRANSCATHETER AORTIC VALVE REPLACEMENT, TRANSFEMORAL N/A 01/19/2021   Procedure: TRANSCATHETER AORTIC VALVE REPLACEMENT, TRANSFEMORAL USING A 26MM EDWARDS VALVE.;  Surgeon: Burnell Blanks, MD;  Location: [place];  Service: Open Heart Surgery;  Laterality: N/A;    Social History   Social History   Tobacco Use   Smoking status: Never   Smokeless tobacco: Never  Vaping Use   Vaping Use: Never used  Substance Use Topics   Alcohol use: No   Drug use: No    Medications   Prior to Admission medications   Medication Sig Start Date End Date Taking? Authorizing Provider  allopurinol (ZYLOPRIM) 300 MG tablet TAKE 1 TABLET BY MOUTH EVERY DAY 04/19/21  Yes Garret Reddish  O, MD  amoxicillin (AMOXIL) 500 MG tablet Take 4 tablets by mouth 1 hour prior to dental appointments and cleanings Patient taking differently: Take 2,000 mg by mouth See admin instructions. Take 4 tablets by mouth 1 hour prior to dental appointments and cleanings 01/27/21  Yes Eileen Stanford, PA-C  atorvastatin (LIPITOR) 40 MG tablet TAKE 1 TABLET BY MOUTH EVERY DAY 02/16/21  Yes Marin Olp, MD  AURYXIA 1 GM 210 MG(Fe) tablet Take 630 mg by mouth 3 (three) times daily with meals. 12/23/20  Yes [provider]  calcitRIOL (ROCALTROL) 0.25 MCG capsule Take 0.25 mcg by mouth daily. 01/29/21  Yes [provider]  docusate sodium (COLACE) 100 MG capsule Take 100 mg by mouth daily.   Yes [provider]  gentamicin ointment (GARAMYCIN) 0.1 % Apply 1 application topically daily.   Yes [provider]  HYDROcodone-acetaminophen (NORCO/VICODIN) 5-325 MG tablet Take 1 tablet by mouth every 6 (six) hours as needed for moderate pain or severe pain. 1-2 tabs po tid prn 09/23/21  Yes Lavina Hamman, MD  iron sucrose in sodium chloride 0.9 % 100 mL Iron Sucrose (Venofer) 11/23/20  Yes [provider]  labetalol (NORMODYNE) 100 MG tablet Take 100 mg by mouth 2 (two) times daily. 04/30/20  Yes [provider]  levETIRAcetam (KEPPRA) 500 MG tablet Take 1 tablet (500 mg total) by mouth 2 (two) times daily for 7 days. 09/23/21 09/30/21 Yes Lavina Hamman, MD  torsemide (DEMADEX) 100 MG tablet Take 1 tablet (100 mg total) by mouth daily. 08/25/21  Yes Burnell Blanks, MD  ELIQUIS 5 MG TABS tablet Take 5 mg by mouth 2 (two) times daily. 09/23/21   [provider]  methocarbamol (ROBAXIN) 500 MG tablet Take 2 tablets (1,000 mg total) by mouth 4 (four) times daily as needed (for Headache). START with 1 pill, if not too drowsy take 2 pills. Patient not taking: Reported on 09/24/2021 09/09/21   Jeanie Sewer, NP  Methoxy PEG-Epoetin Beta (MIRCERA IJ) Inject into the skin as needed. 06/15/20   [provider]    Allergies  No Known Allergies  Review of Systems  ROS  Neurologic Exam  Awake, alert, oriented Memory and concentration grossly intact Speech fluent, appropriate CN grossly intact Motor exam: Upper Extremities Deltoid Bicep Tricep Grip  Right 5/5 5/5 5/5 5/5  Left 5/5 5/5 5/5 5/5   Lower Extremities IP Quad PF DF  EHL  Right 5/5 5/5 5/5 5/5 5/5  Left 5/5 5/5 5/5 5/5 5/5   Sensation grossly intact to LT  Imaging  CT scan and MRI of the brain dated 7/27 and 726 were both personally reviewed and reveal a essentially hemispheric left convexity chronic subdural hematoma with associated local mass effect and mild left to right midline shift.  Impression  - 61 y.o. male with largely asymptomatic large left hemispheric chronic subdural hematoma.  Plan  -We will plan on proceeding with endovascular left middle meningeal artery embolization  I have reviewed the details of the procedure as well as the expected postoperative course and recovery with the patient and his wife.  We have discussed the associated risks, benefits, and alternative treatment strategies.  All their questions today were answered and he provided informed consent to proceed.   Consuella Lose, MD [facility]

## 2021-09-28 NOTE — Transfer of Care (Signed)
Immediate Anesthesia Transfer of Care Note  Patient: Daniel Solomon  Procedure(s) Performed: Middle meningeal artery embolization  Patient Location: PACU  Anesthesia Type:General  Level of Consciousness: awake, oriented and drowsy  Airway & Oxygen Therapy: Patient Spontanous Breathing and Patient connected to nasal cannula oxygen  Post-op Assessment: Report given to RN and Post -op Vital signs reviewed and stable  Post vital signs: Reviewed  Last Vitals:  Vitals Value Taken Time  BP 114/69 09/28/21 1818  Temp    Pulse 82 09/28/21 1824  Resp 20 09/28/21 1824  SpO2 98 % 09/28/21 1824  Vitals shown include unvalidated device data.  Last Pain:  Vitals:   09/28/21 1320  TempSrc:   PainSc: 4       Patients Stated Pain Goal: 0 (12/81/18 8677)  Complications: No notable events documented.

## 2021-09-28 NOTE — Sedation Documentation (Signed)
This RN and Microbiologist transported pt to PACU with CRNA. Report given to PACU nurse. All questions answered prior to our departure. Right groin clean, dry and intact. No bleeding or hematoma present.

## 2021-09-28 NOTE — Brief Op Note (Signed)
  NEUROSURGERY BRIEF OPERATIVE  NOTE   PREOP DX: Left chronic subdural hematoma  POSTOP DX: Same  PROCEDURE: Onyx embolization of left middle meningeal artery  SURGEON: Dr. Consuella Lose, MD  ANESTHESIA: GETA  EBL: Minimal  SPECIMENS: None  COMPLICATIONS: None  CONDITION: Stable to recovery  FINDINGS (Full report in [facility]): 1. Successful onyx embolization of left MMA   Consuella Lose, MD [facility]

## 2021-09-28 NOTE — Anesthesia Procedure Notes (Signed)
Procedure Name: Intubation Date/Time: 09/28/2021 4:21 PM  Performed by: Anastasio Auerbach, CRNAPre-anesthesia Checklist: Patient identified, Emergency Drugs available, Suction available and Patient being monitored Patient Re-evaluated:Patient Re-evaluated prior to induction Oxygen Delivery Method: Circle system utilized Preoxygenation: Pre-oxygenation with 100% oxygen Induction Type: IV induction Ventilation: Mask ventilation without difficulty and Oral airway inserted - appropriate to patient size Laryngoscope Size: Mac and 4 Grade View: Grade I Tube type: Oral Tube size: 7.5 mm Number of attempts: 1 Airway Equipment and Method: Stylet and Oral airway Placement Confirmation: ETT inserted through vocal cords under direct vision, positive ETCO2 and breath sounds checked- equal and bilateral Secured at: 24 cm Tube secured with: Tape Dental Injury: Teeth and Oropharynx as per pre-operative assessment

## 2021-09-29 ENCOUNTER — Other Ambulatory Visit (HOSPITAL_COMMUNITY): Payer: Self-pay | Admitting: Neurosurgery

## 2021-09-29 ENCOUNTER — Encounter (HOSPITAL_COMMUNITY): Payer: Self-pay | Admitting: Neurosurgery

## 2021-09-29 DIAGNOSIS — D499 Neoplasm of unspecified behavior of unspecified site: Secondary | ICD-10-CM

## 2021-09-29 NOTE — Discharge Summary (Signed)
Physician Discharge Summary  Patient ID: Daniel Solomon MRN: [id number] DOB/AGE: 07/05/1960 61 y.o.  Admit date: 09/28/2021 Discharge date: 09/29/2021  Admission Diagnoses:  Subdural hematoma  Discharge Diagnoses:  Same Principal Problem:   Middle cerebral artery aneurysm Active Problems:   Subdural hematoma [facility])   Discharged Condition: Stable  Hospital Course:  Daniel Solomon is a 61 y.o. male admitted after elective left MMA embolization. HE was at baseline postop. He had his peritoneal dialysis. HE was ambulatory, had minimal HA, and requested d/c home.  Treatments: Surgery - Left MMA embolization  Discharge Exam: Blood pressure 112/81, pulse 79, temperature 98 F (36.7 C), temperature source Oral, resp. rate 14, height '5\' 8"'$  (1.727 m), weight 88 kg, SpO2 93 %. Awake, alert, oriented Speech fluent, appropriate CN grossly intact 5/5 BUE/BLE Wound c/d/i  Disposition: Discharge disposition: 01-Home or Self Care       Discharge Instructions     Call MD for:  redness, tenderness, or signs of infection (pain, swelling, redness, odor or green/yellow discharge around incision site)   Complete by: As directed    Call MD for:  temperature >100.4   Complete by: As directed    Diet - low sodium heart healthy   Complete by: As directed    Discharge instructions   Complete by: As directed    Walk at home as much as possible, at least 4 times / day   Increase activity slowly   Complete by: As directed    Lifting restrictions   Complete by: As directed    No lifting > 10 lbs   May shower / Bathe   Complete by: As directed    48 hours after surgery   May walk up steps   Complete by: As directed    No wound care   Complete by: As directed    Other Restrictions   Complete by: As directed    No bending/twisting at waist      Allergies as of 09/29/2021   No Known Allergies      Medication List     STOP taking these medications    Eliquis 5 MG Tabs  tablet Generic drug: apixaban       TAKE these medications    allopurinol 300 MG tablet Commonly known as: ZYLOPRIM TAKE 1 TABLET BY MOUTH EVERY DAY   amoxicillin 500 MG tablet Commonly known as: AMOXIL Take 4 tablets by mouth 1 hour prior to dental appointments and cleanings What changed:  how much to take how to take this when to take this   atorvastatin 40 MG tablet Commonly known as: LIPITOR TAKE 1 TABLET BY MOUTH EVERY DAY   Auryxia 1 GM 210 MG(Fe) tablet Generic drug: ferric citrate Take 630 mg by mouth 3 (three) times daily with meals.   calcitRIOL 0.25 MCG capsule Commonly known as: ROCALTROL Take 0.25 mcg by mouth daily.   docusate sodium 100 MG capsule Commonly known as: COLACE Take 100 mg by mouth daily.   gentamicin ointment 0.1 % Commonly known as: GARAMYCIN Apply 1 application topically daily.   HYDROcodone-acetaminophen 5-325 MG tablet Commonly known as: NORCO/VICODIN Take 1 tablet by mouth every 6 (six) hours as needed for moderate pain or severe pain. 1-2 tabs po tid prn   iron sucrose in sodium chloride 0.9 % 100 mL Iron Sucrose (Venofer)   labetalol 100 MG tablet Commonly known as: NORMODYNE Take 100 mg by mouth 2 (two) times daily.   levETIRAcetam 500 MG tablet  Commonly known as: KEPPRA Take 1 tablet (500 mg total) by mouth 2 (two) times daily for 7 days.   methocarbamol 500 MG tablet Commonly known as: ROBAXIN Take 2 tablets (1,000 mg total) by mouth 4 (four) times daily as needed (for Headache). START with 1 pill, if not too drowsy take 2 pills.   MIRCERA IJ Inject into the skin as needed.   torsemide 100 MG tablet Commonly known as: DEMADEX Take 1 tablet (100 mg total) by mouth daily.        Follow-up Information     Eustace Moore, MD Follow up in 3 week(s).   Specialty: Neurosurgery Contact information: [address] [place] [address] [phone]                 Signed: Jairo Ben 09/29/2021, 10:08 AM

## 2021-09-29 NOTE — Anesthesia Postprocedure Evaluation (Signed)
Anesthesia Post Note  Patient: Daniel Solomon  Procedure(s) Performed: Middle meningeal artery embolization     Patient location during evaluation: PACU Anesthesia Type: General Level of consciousness: patient cooperative and responds to stimulation Pain management: pain level controlled Vital Signs Assessment: post-procedure vital signs reviewed and stable Respiratory status: spontaneous breathing, nonlabored ventilation, respiratory function stable and patient connected to nasal cannula oxygen Cardiovascular status: blood pressure returned to baseline and stable Postop Assessment: no apparent nausea or vomiting Anesthetic complications: no   No notable events documented.  Last Vitals:  Vitals:   09/29/21 0730 09/29/21 0800  BP:  112/81  Pulse: 79 79  Resp: (!) 24 14  Temp:  36.7 C  SpO2: 96% 93%    Last Pain:  Vitals:   09/29/21 0800  TempSrc: Oral  PainSc: 0-No pain                 Norvell Ureste

## 2021-09-29 NOTE — Consult Note (Addendum)
[facility] Renal Consultation Note    Indication for Consultation:  Management of ESRD/hemodialysis; anemia, hypertension/volume and secondary hyperparathyroidism  HER:DEYCXK, Brayton Mars, MD  HPI: Daniel Solomon is a 61 y.o. male with ESRD on peritoneal dialysis.  Past medical history significant for persistent A fib on Eliquis, bicuspid aortic valve w/ severe aortic stenosis s/p TAVR, OSA, thoracic aortic aneurysm, CAD, HTN, HLD, and chronic L sided hemispheric subdural hematoma.  Patient presented to the hospital yesterday for elective middle meningeal artery embolization.  Today he is feeling well and requested to discharge home.  No issues with peritoneal dialysis overnight. Denies CP, SOB and n/v/d.    Past Medical History:  Diagnosis Date   Anemia    Bicuspid aortic valve    Blood transfusion without reported diagnosis    CAD (coronary artery disease)    proximal LAD 50%, mid LAD 50-60%, mid to distal LAD 99%; mid circumflex 20%; proximal RCA 20%, distal RCA 50%; PDA 60-70%, proximal posterior AV groove 50%, proximal posterior lateral 50%.  There was a question of possible LVOT gradient;  s/p Resolute DES to mid-distal LAD 10/2010;  echocardiogram 11/17/10: EF 55-60%, mild LVH, grade 1 diastolic dysfunction, normal aortic valve, mild MR, PASP 32.    Chicken pox    Enlarged aorta ([name])    ESRD on peritoneal dialysis ([name])    Glaucoma    Gout    allopurinol '300mg'$ , colchicine prn in past, has not had flares   Headache    History of kidney stones    Hypercholesterolemia    With hypertriglyceridemia   Hypertension    Mild aortic stenosis    PAF (paroxysmal atrial fibrillation) ([place])    a. Documented on event monitor 01/2016.   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   SDH (subdural hematoma) ([place]) 08/2021   left   Sleep apnea     cpap nightly   Past Surgical History:  Procedure Laterality Date   CORONARY STENT PLACEMENT  2012   CYSTOSCOPY/RETROGRADE/URETEROSCOPY  03/26/2012   Procedure: CYSTOSCOPY/RETROGRADE/URETEROSCOPY;  Surgeon: [name], MD;  Location: [facility];  Service: Urology;  Laterality: Bilateral;  CYSTOSCOPY BILATERAL RETROGRADE PYELOGRAM AND POSSIBLE URETEROSCOPY   INTRAOPERATIVE TRANSTHORACIC ECHOCARDIOGRAM N/A 01/19/2021   Procedure: INTRAOPERATIVE TRANSTHORACIC ECHOCARDIOGRAM;  Surgeon: Burnell Blanks, MD;  Location: [place];  Service: Open Heart Surgery;  Laterality: N/A;   RADIOLOGY WITH ANESTHESIA N/A 09/28/2021   Procedure: Middle meningeal artery embolization;  Surgeon: Consuella Lose, MD;  Location: [place];  Service: Radiology;  Laterality: N/A;   RIGHT HEART CATH AND CORONARY ANGIOGRAPHY N/A 12/04/2020   Procedure: RIGHT HEART CATH AND CORONARY ANGIOGRAPHY;  Surgeon: Burnell Blanks, MD;  Location: [facility] [facility];  Service: Cardiovascular;  Laterality: N/A;   TRANSCATHETER AORTIC VALVE REPLACEMENT, TRANSFEMORAL N/A 01/19/2021   Procedure: TRANSCATHETER AORTIC VALVE REPLACEMENT, TRANSFEMORAL USING A 26MM EDWARDS VALVE.;  Surgeon: Burnell Blanks, MD;  Location: [place];  Service: Open Heart Surgery;  Laterality: N/A;   Family History  Problem Relation Age of Onset   Hypertension Father    CAD Father        CABG 68s   Kidney disease Father        Stage IV   Valvular heart disease Mother        AVR July 2015   Diabetes  Paternal Grandmother    Colon cancer Neg Hx    Social History:  reports that he has never smoked. He has never used smokeless tobacco. He reports that he does not drink alcohol and does not use drugs. No Known Allergies Prior to Admission medications   Medication Sig Start Date End Date Taking? Authorizing Provider  allopurinol (ZYLOPRIM) 300 MG tablet TAKE 1 TABLET BY MOUTH EVERY DAY 04/19/21  Yes Marin Olp, MD   amoxicillin (AMOXIL) 500 MG tablet Take 4 tablets by mouth 1 hour prior to dental appointments and cleanings Patient taking differently: Take 2,000 mg by mouth See admin instructions. Take 4 tablets by mouth 1 hour prior to dental appointments and cleanings 01/27/21  Yes Eileen Stanford, PA-C  atorvastatin (LIPITOR) 40 MG tablet TAKE 1 TABLET BY MOUTH EVERY DAY 02/16/21  Yes Marin Olp, MD  AURYXIA 1 GM 210 MG(Fe) tablet Take 630 mg by mouth 3 (three) times daily with meals. 12/23/20  Yes [provider]  calcitRIOL (ROCALTROL) 0.25 MCG capsule Take 0.25 mcg by mouth daily. 01/29/21  Yes [provider]  docusate sodium (COLACE) 100 MG capsule Take 100 mg by mouth daily.   Yes [provider]  gentamicin ointment (GARAMYCIN) 0.1 % Apply 1 application topically daily.   Yes [provider]  HYDROcodone-acetaminophen (NORCO/VICODIN) 5-325 MG tablet Take 1 tablet by mouth every 6 (six) hours as needed for moderate pain or severe pain. 1-2 tabs po tid prn 09/23/21  Yes Lavina Hamman, MD  iron sucrose in sodium chloride 0.9 % 100 mL Iron Sucrose (Venofer) 11/23/20  Yes [provider]  labetalol (NORMODYNE) 100 MG tablet Take 100 mg by mouth 2 (two) times daily. 04/30/20  Yes [provider]  levETIRAcetam (KEPPRA) 500 MG tablet Take 1 tablet (500 mg total) by mouth 2 (two) times daily for 7 days. 09/23/21 09/30/21 Yes Lavina Hamman, MD  torsemide (DEMADEX) 100 MG tablet Take 1 tablet (100 mg total) by mouth daily. 08/25/21  Yes Burnell Blanks, MD  ELIQUIS 5 MG TABS tablet Take 5 mg by mouth 2 (two) times daily. 09/23/21   [provider]  methocarbamol (ROBAXIN) 500 MG tablet Take 2 tablets (1,000 mg total) by mouth 4 (four) times daily as needed (for Headache). START with 1 pill, if not too drowsy take 2 pills. Patient not taking: Reported on 09/24/2021 09/09/21   Jeanie Sewer, NP  Methoxy PEG-Epoetin Beta (MIRCERA IJ)  Inject into the skin as needed. 06/15/20   [provider]   Current Facility-Administered Medications  Medication Dose Route Frequency Provider Last Rate Last Admin   0.9 %  sodium chloride infusion   Intravenous Continuous Consuella Lose, MD       allopurinol (ZYLOPRIM) tablet 300 mg  300 mg Oral Daily Consuella Lose, MD   300 mg at 09/29/21 1005   atorvastatin (LIPITOR) tablet 40 mg  40 mg Oral Daily Consuella Lose, MD   40 mg at 09/29/21 1005   calcitRIOL (ROCALTROL) capsule 0.25 mcg  0.25 mcg Oral Daily Consuella Lose, MD   0.25 mcg at 09/29/21 1005   Chlorhexidine Gluconate Cloth 2 % PADS 6 each  6 each Topical Daily Consuella Lose, MD   6 each at 09/29/21 1006   dialysis solution 1.5% low-MG/low-CA dianeal solution   Intraperitoneal Q24H Reesa Chew, MD       dialysis solution 2.5% low-MG/low-CA dianeal solution   Intraperitoneal Q24H Reesa Chew, MD  dialysis solution 4.25% low-MG/low-CA dianeal solution   Intraperitoneal Q24H Reesa Chew, MD       docusate sodium (COLACE) capsule 100 mg  100 mg Oral Daily Consuella Lose, MD   100 mg at 09/29/21 1005   ferric citrate (AURYXIA) tablet 630 mg  630 mg Oral TID WC Consuella Lose, MD       gentamicin cream (GARAMYCIN) 0.1 % 1 Application  1 Application Topical Daily Reesa Chew, MD       hydrALAZINE (APRESOLINE) injection 10 mg  10 mg Intravenous Q4H PRN Bergman, Meghan D, NP       HYDROcodone-acetaminophen (NORCO/VICODIN) 5-325 MG per tablet 1 tablet  1 tablet Oral Q6H PRN Consuella Lose, MD       labetalol (NORMODYNE) tablet 100 mg  100 mg Oral BID Consuella Lose, MD   100 mg at 09/29/21 1005   levETIRAcetam (KEPPRA) tablet 500 mg  500 mg Oral BID Consuella Lose, MD   500 mg at 09/29/21 1005   ondansetron (ZOFRAN) tablet 4 mg  4 mg Oral Q4H PRN Consuella Lose, MD       Or   ondansetron (ZOFRAN) injection 4 mg  4 mg Intravenous Q4H PRN Consuella Lose, MD        Oral care mouth rinse  15 mL Mouth Rinse PRN Consuella Lose, MD       torsemide (DEMADEX) tablet 100 mg  100 mg Oral Daily Consuella Lose, MD   100 mg at 09/29/21 1005   Current Outpatient Medications  Medication Sig Dispense Refill   allopurinol (ZYLOPRIM) 300 MG tablet TAKE 1 TABLET BY MOUTH EVERY DAY 90 tablet 2   amoxicillin (AMOXIL) 500 MG tablet Take 4 tablets by mouth 1 hour prior to dental appointments and cleanings (Patient taking differently: Take 2,000 mg by mouth See admin instructions. Take 4 tablets by mouth 1 hour prior to dental appointments and cleanings) 12 tablet 6   atorvastatin (LIPITOR) 40 MG tablet TAKE 1 TABLET BY MOUTH EVERY DAY 90 tablet 2   AURYXIA 1 GM 210 MG(Fe) tablet Take 630 mg by mouth 3 (three) times daily with meals.     calcitRIOL (ROCALTROL) 0.25 MCG capsule Take 0.25 mcg by mouth daily.     docusate sodium (COLACE) 100 MG capsule Take 100 mg by mouth daily.     gentamicin ointment (GARAMYCIN) 0.1 % Apply 1 application topically daily.     HYDROcodone-acetaminophen (NORCO/VICODIN) 5-325 MG tablet Take 1 tablet by mouth every 6 (six) hours as needed for moderate pain or severe pain. 1-2 tabs po tid prn 20 tablet 0   iron sucrose in sodium chloride 0.9 % 100 mL Iron Sucrose (Venofer)     labetalol (NORMODYNE) 100 MG tablet Take 100 mg by mouth 2 (two) times daily.     levETIRAcetam (KEPPRA) 500 MG tablet Take 1 tablet (500 mg total) by mouth 2 (two) times daily for 7 days. 14 tablet 0   torsemide (DEMADEX) 100 MG tablet Take 1 tablet (100 mg total) by mouth daily. 90 tablet 3   ELIQUIS 5 MG TABS tablet Take 5 mg by mouth 2 (two) times daily.     methocarbamol (ROBAXIN) 500 MG tablet Take 2 tablets (1,000 mg total) by mouth 4 (four) times daily as needed (for Headache). START with 1 pill, if not too drowsy take 2 pills. (Patient not taking: Reported on 09/24/2021) 60 tablet 0   Methoxy PEG-Epoetin Beta (MIRCERA IJ) Inject into the skin as needed.  Labs: Basic Metabolic Panel: Recent Labs  Lab 09/22/21 1849 09/23/21 0641 09/28/21 1332  NA 136 139 137  K 3.4* 3.5 4.0  CL 94* 99 98  CO2 25 24  --   GLUCOSE 116* 101* 76  BUN 60* 60* 76*  CREATININE 14.97* 14.67* >18.00*  CALCIUM 9.9 9.8  --   PHOS  --  5.6*  --    Liver Function Tests: Recent Labs  Lab 09/22/21 1849 09/23/21 0641  AST 21 21  ALT 41 38  ALKPHOS 82 81  BILITOT 0.6 0.6  PROT 6.8 7.1  ALBUMIN 3.6 3.4*   CBC: Recent Labs  Lab 09/22/21 1849 09/23/21 0641 09/28/21 1332  WBC 7.0 6.4  --   NEUTROABS 4.3 4.0  --   HGB 12.5* 12.2* 14.6  HCT 36.2* 36.4* 43.0  MCV 105.5* 102.8*  --   PLT 138* 140*  --     ROS: All others negative except those listed in HPI.   Physical Exam: Vitals:   09/29/21 0700 09/29/21 0730 09/29/21 0800 09/29/21 1000  BP: 128/83  112/81   Pulse: 73 79 79   Resp: 14 (!) '24 14 15  '$ Temp:   98 F (36.7 C)   TempSrc:   Oral   SpO2: 94% 96% 93%   Weight:      Height:         General: WDWN male, sitting dressed in bedside chair in NAD Head: NCAT sclera not icteric MMM Neck: Supple. No lymphadenopathy Lungs: CTA bilaterally. No wheeze, rales or rhonchi. Breathing is unlabored. Heart: RRR. No murmur, rubs or gallops.  Abdomen: soft, nontender, +BS, no guarding, no rebound tenderness  Lower extremities:no edema, ischemic changes, or open wounds  Neuro: AAOx3. Moves all extremities spontaneously. Psych:  Responds to questions appropriately with a normal affect. Dialysis Access: PD cath  Dialysis Orders:  CCPD, dose 4 exchanges overnight , fill volume is 3000 cc  Assessment/Plan:  Chronic subdural hematoma - s/p elective middle meningeal artery embolization  ESRD -  On PD. Tolerated PD well overnight.   Hypertension/volume  - BP in goal. Does not appear volume overloaded.   Anemia of CKD - Hgb 14. No indication for ESA.  Secondary Hyperparathyroidism -  Calcium in goal. Phos slightly elevated, continue binders.   Nutrition - Renal diet w/fluid restrictions.   Jen Mow, PA-C [facility] 09/29/2021, 11:12 AM  Nephrology attending: I have personally seen and examined the patient at bedside.  Chart reviewed.  I agree with above. Status postelective middle meningeal artery embolization for chronic subdural hematoma.  He is clinically stable.  Tolerated PD well overnight.  He is being discharged home today.  Recommend to resume PD tonight.    Katheran James, [facility].

## 2021-09-29 NOTE — Progress Notes (Signed)
Pre TX VS: 98.1-121/87-83-14-93% RA  PD treatment initiated via aseptic technique. Consent signed and in chart. Patient is alert and oriented. No complaints of pain. No specimen collected. PD exit site clean, dry and intact. Patient states his dressing was already changed, will change dressing tomorrow.

## 2021-09-30 ENCOUNTER — Encounter (HOSPITAL_COMMUNITY): Payer: Self-pay

## 2021-09-30 ENCOUNTER — Other Ambulatory Visit (HOSPITAL_COMMUNITY): Payer: Self-pay | Admitting: Neurosurgery

## 2021-09-30 DIAGNOSIS — D499 Neoplasm of unspecified behavior of unspecified site: Secondary | ICD-10-CM

## 2021-09-30 HISTORY — PX: IR NEURO EACH ADD'L AFTER BASIC UNI RIGHT (MS): IMG5374

## 2021-09-30 HISTORY — PX: IR ANGIOGRAM FOLLOW UP STUDY: IMG697

## 2021-10-05 ENCOUNTER — Encounter (HOSPITAL_COMMUNITY)
Admission: EM | Disposition: A | Discharge status: Home Health | Payer: Self-pay | Source: Home / Self Care | Attending: Neurosurgery

## 2021-10-05 ENCOUNTER — Inpatient Hospital Stay (HOSPITAL_COMMUNITY): Payer: Managed Care, Other (non HMO)

## 2021-10-05 ENCOUNTER — Inpatient Hospital Stay (HOSPITAL_COMMUNITY)
Admission: EM | Admit: 2021-10-05 | Discharge: 2021-10-14 | DRG: 025 | Disposition: A | Discharge status: Home Health | Payer: Managed Care, Other (non HMO) | Attending: Neurological Surgery | Admitting: Neurological Surgery

## 2021-10-05 ENCOUNTER — Emergency Department (HOSPITAL_COMMUNITY): Payer: Managed Care, Other (non HMO) | Admitting: Anesthesiology

## 2021-10-05 ENCOUNTER — Other Ambulatory Visit: Payer: Self-pay

## 2021-10-05 ENCOUNTER — Emergency Department (HOSPITAL_COMMUNITY): Payer: Managed Care, Other (non HMO)

## 2021-10-05 ENCOUNTER — Encounter (HOSPITAL_COMMUNITY): Payer: Self-pay | Admitting: Emergency Medicine

## 2021-10-05 DIAGNOSIS — I120 Hypertensive chronic kidney disease with stage 5 chronic kidney disease or end stage renal disease: Secondary | ICD-10-CM | POA: Diagnosis present

## 2021-10-05 DIAGNOSIS — I2510 Atherosclerotic heart disease of native coronary artery without angina pectoris: Secondary | ICD-10-CM | POA: Diagnosis present

## 2021-10-05 DIAGNOSIS — I6201 Nontraumatic acute subdural hemorrhage: Secondary | ICD-10-CM

## 2021-10-05 DIAGNOSIS — N186 End stage renal disease: Secondary | ICD-10-CM

## 2021-10-05 DIAGNOSIS — Z9911 Dependence on respirator [ventilator] status: Secondary | ICD-10-CM | POA: Diagnosis not present

## 2021-10-05 DIAGNOSIS — R1310 Dysphagia, unspecified: Secondary | ICD-10-CM | POA: Diagnosis present

## 2021-10-05 DIAGNOSIS — Z955 Presence of coronary angioplasty implant and graft: Secondary | ICD-10-CM | POA: Diagnosis not present

## 2021-10-05 DIAGNOSIS — G935 Compression of brain: Secondary | ICD-10-CM | POA: Diagnosis present

## 2021-10-05 DIAGNOSIS — I10 Essential (primary) hypertension: Secondary | ICD-10-CM | POA: Diagnosis present

## 2021-10-05 DIAGNOSIS — Z833 Family history of diabetes mellitus: Secondary | ICD-10-CM | POA: Diagnosis not present

## 2021-10-05 DIAGNOSIS — Z87442 Personal history of urinary calculi: Secondary | ICD-10-CM | POA: Diagnosis not present

## 2021-10-05 DIAGNOSIS — G9741 Accidental puncture or laceration of dura during a procedure: Secondary | ICD-10-CM | POA: Diagnosis not present

## 2021-10-05 DIAGNOSIS — E7800 Pure hypercholesterolemia, unspecified: Secondary | ICD-10-CM | POA: Diagnosis present

## 2021-10-05 DIAGNOSIS — Z9989 Dependence on other enabling machines and devices: Secondary | ICD-10-CM | POA: Diagnosis not present

## 2021-10-05 DIAGNOSIS — Z79899 Other long term (current) drug therapy: Secondary | ICD-10-CM

## 2021-10-05 DIAGNOSIS — S065XAA Traumatic subdural hemorrhage with loss of consciousness status unknown, initial encounter: Secondary | ICD-10-CM | POA: Diagnosis not present

## 2021-10-05 DIAGNOSIS — I480 Paroxysmal atrial fibrillation: Secondary | ICD-10-CM | POA: Diagnosis present

## 2021-10-05 DIAGNOSIS — I350 Nonrheumatic aortic (valve) stenosis: Secondary | ICD-10-CM | POA: Diagnosis present

## 2021-10-05 DIAGNOSIS — Z992 Dependence on renal dialysis: Secondary | ICD-10-CM

## 2021-10-05 DIAGNOSIS — G4733 Obstructive sleep apnea (adult) (pediatric): Secondary | ICD-10-CM | POA: Diagnosis not present

## 2021-10-05 DIAGNOSIS — D631 Anemia in chronic kidney disease: Secondary | ICD-10-CM | POA: Diagnosis present

## 2021-10-05 DIAGNOSIS — G9341 Metabolic encephalopathy: Secondary | ICD-10-CM | POA: Diagnosis present

## 2021-10-05 DIAGNOSIS — M109 Gout, unspecified: Secondary | ICD-10-CM | POA: Diagnosis present

## 2021-10-05 DIAGNOSIS — Y838 Other surgical procedures as the cause of abnormal reaction of the patient, or of later complication, without mention of misadventure at the time of the procedure: Secondary | ICD-10-CM | POA: Diagnosis not present

## 2021-10-05 DIAGNOSIS — Z841 Family history of disorders of kidney and ureter: Secondary | ICD-10-CM | POA: Diagnosis not present

## 2021-10-05 DIAGNOSIS — E785 Hyperlipidemia, unspecified: Secondary | ICD-10-CM | POA: Diagnosis not present

## 2021-10-05 DIAGNOSIS — Z953 Presence of xenogenic heart valve: Secondary | ICD-10-CM

## 2021-10-05 DIAGNOSIS — Z8249 Family history of ischemic heart disease and other diseases of the circulatory system: Secondary | ICD-10-CM

## 2021-10-05 DIAGNOSIS — R519 Headache, unspecified: Principal | ICD-10-CM

## 2021-10-05 DIAGNOSIS — S061XAA Traumatic cerebral edema with loss of consciousness status unknown, initial encounter: Secondary | ICD-10-CM | POA: Diagnosis not present

## 2021-10-05 DIAGNOSIS — M898X9 Other specified disorders of bone, unspecified site: Secondary | ICD-10-CM | POA: Diagnosis present

## 2021-10-05 DIAGNOSIS — E781 Pure hyperglyceridemia: Secondary | ICD-10-CM | POA: Diagnosis present

## 2021-10-05 DIAGNOSIS — R600 Localized edema: Secondary | ICD-10-CM | POA: Diagnosis present

## 2021-10-05 DIAGNOSIS — M1A9XX0 Chronic gout, unspecified, without tophus (tophi): Secondary | ICD-10-CM | POA: Diagnosis not present

## 2021-10-05 DIAGNOSIS — I6202 Nontraumatic subacute subdural hemorrhage: Principal | ICD-10-CM | POA: Diagnosis present

## 2021-10-05 DIAGNOSIS — Z9889 Other specified postprocedural states: Secondary | ICD-10-CM

## 2021-10-05 DIAGNOSIS — R270 Ataxia, unspecified: Secondary | ICD-10-CM | POA: Diagnosis present

## 2021-10-05 DIAGNOSIS — R4701 Aphasia: Secondary | ICD-10-CM | POA: Diagnosis present

## 2021-10-05 HISTORY — PX: CRANIOTOMY: SHX93

## 2021-10-05 LAB — CBC WITH DIFFERENTIAL/PLATELET
Abs Immature Granulocytes: 0.04 10*3/uL (ref 0.00–0.07)
Abs Immature Granulocytes: 0.03 10*3/uL (ref 0.00–0.07)
Basophils Absolute: 0 10*3/uL (ref 0.0–0.1)
Basophils Absolute: 0 10*3/uL (ref 0.0–0.1)
Basophils Relative: 0 %
Basophils Relative: 0 %
Eosinophils Absolute: 0.1 10*3/uL (ref 0.0–0.5)
Eosinophils Absolute: 0 10*3/uL (ref 0.0–0.5)
Eosinophils Relative: 2 %
Eosinophils Relative: 0 %
HCT: 32.9 % — ABNORMAL LOW (ref 39.0–52.0)
HCT: 26.4 % — ABNORMAL LOW (ref 39.0–52.0)
Hemoglobin: 11 g/dL — ABNORMAL LOW (ref 13.0–17.0)
Hemoglobin: 8.7 g/dL — ABNORMAL LOW (ref 13.0–17.0)
Immature Granulocytes: 1 %
Immature Granulocytes: 1 %
Lymphocytes Relative: 16 %
Lymphocytes Relative: 8 %
Lymphs Abs: 1.1 10*3/uL (ref 0.7–4.0)
Lymphs Abs: 0.5 10*3/uL — ABNORMAL LOW (ref 0.7–4.0)
MCH: 34.5 pg — ABNORMAL HIGH (ref 26.0–34.0)
MCH: 33.9 pg (ref 26.0–34.0)
MCHC: 33.4 g/dL (ref 30.0–36.0)
MCHC: 33 g/dL (ref 30.0–36.0)
MCV: 103.1 fL — ABNORMAL HIGH (ref 80.0–100.0)
MCV: 102.7 fL — ABNORMAL HIGH (ref 80.0–100.0)
Monocytes Absolute: 0.6 10*3/uL (ref 0.1–1.0)
Monocytes Absolute: 0.1 10*3/uL (ref 0.1–1.0)
Monocytes Relative: 8 %
Monocytes Relative: 2 %
Neutro Abs: 5.2 10*3/uL (ref 1.7–7.7)
Neutro Abs: 5.4 10*3/uL (ref 1.7–7.7)
Neutrophils Relative %: 73 %
Neutrophils Relative %: 89 %
Platelets: 121 10*3/uL — ABNORMAL LOW (ref 150–400)
Platelets: 102 10*3/uL — ABNORMAL LOW (ref 150–400)
RBC: 3.19 MIL/uL — ABNORMAL LOW (ref 4.22–5.81)
RBC: 2.57 MIL/uL — ABNORMAL LOW (ref 4.22–5.81)
RDW: 13.5 % (ref 11.5–15.5)
RDW: 13.4 % (ref 11.5–15.5)
WBC: 7.1 10*3/uL (ref 4.0–10.5)
WBC: 6.1 10*3/uL (ref 4.0–10.5)
nRBC: 0 % (ref 0.0–0.2)
nRBC: 0 % (ref 0.0–0.2)

## 2021-10-05 LAB — PROTIME-INR
INR: 1.3 — ABNORMAL HIGH (ref 0.8–1.2)
Prothrombin Time: 15.7 seconds — ABNORMAL HIGH (ref 11.4–15.2)

## 2021-10-05 LAB — POCT I-STAT 7, (LYTES, BLD GAS, ICA,H+H)
Acid-base deficit: 3 mmol/L — ABNORMAL HIGH (ref 0.0–2.0)
Bicarbonate: 21 mmol/L (ref 20.0–28.0)
Calcium, Ion: 1.15 mmol/L (ref 1.15–1.40)
HCT: 28 % — ABNORMAL LOW (ref 39.0–52.0)
Hemoglobin: 9.5 g/dL — ABNORMAL LOW (ref 13.0–17.0)
O2 Saturation: 100 %
Patient temperature: 97.3
Potassium: 5 mmol/L (ref 3.5–5.1)
Sodium: 134 mmol/L — ABNORMAL LOW (ref 135–145)
TCO2: 22 mmol/L (ref 22–32)
pCO2 arterial: 30.2 mmHg — ABNORMAL LOW (ref 32–48)
pH, Arterial: 7.446 (ref 7.35–7.45)
pO2, Arterial: 188 mmHg — ABNORMAL HIGH (ref 83–108)

## 2021-10-05 LAB — BASIC METABOLIC PANEL
Anion gap: 18 — ABNORMAL HIGH (ref 5–15)
BUN: 95 mg/dL — ABNORMAL HIGH (ref 6–20)
CO2: 24 mmol/L (ref 22–32)
Calcium: 9.7 mg/dL (ref 8.9–10.3)
Chloride: 97 mmol/L — ABNORMAL LOW (ref 98–111)
Creatinine, Ser: 18.17 mg/dL — ABNORMAL HIGH (ref 0.61–1.24)
GFR, Estimated: 3 mL/min — ABNORMAL LOW (ref >60)
Glucose, Bld: 105 mg/dL — ABNORMAL HIGH (ref 70–99)
Potassium: 4.3 mmol/L (ref 3.5–5.1)
Sodium: 139 mmol/L (ref 135–145)

## 2021-10-05 LAB — APTT: aPTT: 31 seconds (ref 24–36)

## 2021-10-05 SURGERY — CRANIOTOMY HEMATOMA EVACUATION SUBDURAL
Anesthesia: General | Site: Head | Laterality: Left

## 2021-10-05 SURGERY — Surgical Case
Anesthesia: *Unknown

## 2021-10-05 MED ORDER — CHLORHEXIDINE GLUCONATE 0.12 % MT SOLN: 15.0000 mL | OROMUCOSAL | Status: AC

## 2021-10-05 MED ORDER — LIDOCAINE 2% (20 MG/ML) 5 ML SYRINGE
INTRAMUSCULAR | Status: DC | PRN
  Administered 2021-10-05: 60 mg via INTRAVENOUS

## 2021-10-05 MED ORDER — PROPOFOL 1000 MG/100ML IV EMUL
0.0000 ug/kg/min | INTRAVENOUS | Status: DC
  Administered 2021-10-05: 50 ug/kg/min via INTRAVENOUS
  Administered 2021-10-06: 40 ug/kg/min via INTRAVENOUS
  Filled 2021-10-05 (×3): qty 100

## 2021-10-05 MED ORDER — LIDOCAINE 2% (20 MG/ML) 5 ML SYRINGE
INTRAMUSCULAR | Status: AC
  Filled 2021-10-05: qty 5

## 2021-10-05 MED ORDER — FENTANYL CITRATE (PF) 250 MCG/5ML IJ SOLN
INTRAMUSCULAR | Status: AC
  Filled 2021-10-05: qty 5

## 2021-10-05 MED ORDER — SODIUM CHLORIDE 0.9 % IV SOLN: INTRAVENOUS | Status: DC

## 2021-10-05 MED ORDER — CEFAZOLIN SODIUM-DEXTROSE 2-3 GM-%(50ML) IV SOLR
INTRAVENOUS | Status: DC | PRN
  Administered 2021-10-05: 2 g via INTRAVENOUS

## 2021-10-05 MED ORDER — CEFAZOLIN SODIUM-DEXTROSE 2-4 GM/100ML-% IV SOLN
INTRAVENOUS | Status: AC
  Filled 2021-10-05: qty 100

## 2021-10-05 MED ORDER — ROCURONIUM BROMIDE 10 MG/ML (PF) SYRINGE
PREFILLED_SYRINGE | INTRAVENOUS | Status: AC
  Filled 2021-10-05: qty 10

## 2021-10-05 MED ORDER — ORAL CARE MOUTH RINSE
15.0000 mL | OROMUCOSAL | Status: DC
Start: 2021-10-06 — End: 2021-10-05

## 2021-10-05 MED ORDER — THROMBIN 5000 UNITS EX SOLR
CUTANEOUS | Status: AC
  Filled 2021-10-05: qty 5000

## 2021-10-05 MED ORDER — THROMBIN 5000 UNITS EX SOLR
OROMUCOSAL | Status: DC | PRN
  Administered 2021-10-05: 5 mL via TOPICAL

## 2021-10-05 MED ORDER — ACETAMINOPHEN 10 MG/ML IV SOLN
INTRAVENOUS | Status: DC | PRN
  Administered 2021-10-05: 1000 mg via INTRAVENOUS

## 2021-10-05 MED ORDER — CHLORHEXIDINE GLUCONATE 0.12 % MT SOLN
15.0000 mL | Freq: Once | OROMUCOSAL | Status: AC
  Administered 2021-10-05: 15 mL via OROMUCOSAL
  Filled 2021-10-05: qty 15

## 2021-10-05 MED ORDER — ONDANSETRON HCL 4 MG PO TABS: 4.0000 mg | ORAL_TABLET | ORAL | Status: DC | PRN

## 2021-10-05 MED ORDER — TORSEMIDE 100 MG PO TABS
100.0000 mg | ORAL_TABLET | Freq: Every day | ORAL | Status: DC
Start: 2021-10-06 — End: 2021-10-06
  Filled 2021-10-05: qty 1

## 2021-10-05 MED ORDER — LABETALOL HCL 100 MG PO TABS
100.0000 mg | ORAL_TABLET | Freq: Two times a day (BID) | ORAL | Status: DC
Start: 2021-10-05 — End: 2021-10-05

## 2021-10-05 MED ORDER — SENNOSIDES-DOCUSATE SODIUM 8.6-50 MG PO TABS: 1.0000 | ORAL_TABLET | Freq: Every evening | ORAL | Status: DC | PRN

## 2021-10-05 MED ORDER — FENTANYL CITRATE (PF) 250 MCG/5ML IJ SOLN
INTRAMUSCULAR | Status: DC | PRN
  Administered 2021-10-05: 100 ug via INTRAVENOUS
  Administered 2021-10-05 (×4): 50 ug via INTRAVENOUS

## 2021-10-05 MED ORDER — PANTOPRAZOLE SODIUM 40 MG IV SOLR
40.0000 mg | Freq: Every day | INTRAVENOUS | Status: DC
  Administered 2021-10-05: 40 mg via INTRAVENOUS
  Filled 2021-10-05: qty 10

## 2021-10-05 MED ORDER — POLYETHYLENE GLYCOL 3350 17 G PO PACK
17.0000 g | PACK | Freq: Every day | ORAL | Status: DC
Start: 2021-10-06 — End: 2021-10-06

## 2021-10-05 MED ORDER — ONDANSETRON HCL 4 MG/2ML IJ SOLN
INTRAMUSCULAR | Status: AC
  Filled 2021-10-05: qty 2

## 2021-10-05 MED ORDER — SODIUM CHLORIDE 0.9 % IV SOLN: INTRAVENOUS | Status: DC | PRN

## 2021-10-05 MED ORDER — BISACODYL 5 MG PO TBEC: 5.0000 mg | DELAYED_RELEASE_TABLET | Freq: Every day | ORAL | Status: DC | PRN

## 2021-10-05 MED ORDER — ALBUMIN HUMAN 5 % IV SOLN: INTRAVENOUS | Status: DC | PRN

## 2021-10-05 MED ORDER — DEXAMETHASONE SODIUM PHOSPHATE 10 MG/ML IJ SOLN
INTRAMUSCULAR | Status: DC | PRN
  Administered 2021-10-05: 10 mg via INTRAVENOUS
  Administered 2021-10-05: 50 mg via INTRAVENOUS

## 2021-10-05 MED ORDER — ATORVASTATIN CALCIUM 40 MG PO TABS: 40.0000 mg | ORAL_TABLET | Freq: Every day | ORAL | Status: DC

## 2021-10-05 MED ORDER — ALLOPURINOL 300 MG PO TABS
300.0000 mg | ORAL_TABLET | Freq: Every day | ORAL | Status: DC
Start: 2021-10-06 — End: 2021-10-06

## 2021-10-05 MED ORDER — BACITRACIN ZINC 500 UNIT/GM EX OINT
TOPICAL_OINTMENT | CUTANEOUS | Status: AC
  Filled 2021-10-05: qty 28.35

## 2021-10-05 MED ORDER — ARTIFICIAL TEARS OPHTHALMIC OINT
TOPICAL_OINTMENT | OPHTHALMIC | Status: DC | PRN
  Administered 2021-10-05: 1 via OPHTHALMIC

## 2021-10-05 MED ORDER — ALLOPURINOL 300 MG PO TABS
300.0000 mg | ORAL_TABLET | Freq: Every day | ORAL | Status: DC
Start: 2021-10-06 — End: 2021-10-05

## 2021-10-05 MED ORDER — PROPOFOL 500 MG/50ML IV EMUL
INTRAVENOUS | Status: DC | PRN
  Administered 2021-10-05: 75 ug/kg/min via INTRAVENOUS

## 2021-10-05 MED ORDER — FENTANYL CITRATE PF 50 MCG/ML IJ SOSY: 50.0000 ug | PREFILLED_SYRINGE | INTRAMUSCULAR | Status: DC | PRN

## 2021-10-05 MED ORDER — ARTIFICIAL TEARS OPHTHALMIC OINT
TOPICAL_OINTMENT | OPHTHALMIC | Status: AC
  Filled 2021-10-05: qty 3.5

## 2021-10-05 MED ORDER — THROMBIN 20000 UNITS EX SOLR
CUTANEOUS | Status: AC
  Filled 2021-10-05: qty 20000

## 2021-10-05 MED ORDER — HYDROCODONE-ACETAMINOPHEN 5-325 MG PO TABS: 1.0000 | ORAL_TABLET | ORAL | Status: DC | PRN

## 2021-10-05 MED ORDER — ORAL CARE MOUTH RINSE: 15.0000 mL | OROMUCOSAL | Status: DC | PRN

## 2021-10-05 MED ORDER — ACETAMINOPHEN 325 MG PO TABS
650.0000 mg | ORAL_TABLET | ORAL | Status: DC | PRN
  Administered 2021-10-07 (×2): 650 mg via ORAL
  Filled 2021-10-05 (×2): qty 2

## 2021-10-05 MED ORDER — ORAL CARE MOUTH RINSE
15.0000 mL | OROMUCOSAL | Status: DC
  Administered 2021-10-05 – 2021-10-06 (×5): 15 mL via OROMUCOSAL

## 2021-10-05 MED ORDER — DOCUSATE SODIUM 100 MG PO CAPS
100.0000 mg | ORAL_CAPSULE | Freq: Every day | ORAL | Status: DC
Start: 2021-10-06 — End: 2021-10-05

## 2021-10-05 MED ORDER — LIDOCAINE-EPINEPHRINE 0.5 %-1:200000 IJ SOLN
INTRAMUSCULAR | Status: DC | PRN
  Administered 2021-10-05: 15 mL via INTRADERMAL

## 2021-10-05 MED ORDER — PROPOFOL 10 MG/ML IV BOLUS
INTRAVENOUS | Status: DC | PRN
  Administered 2021-10-05 (×2): 30 mg via INTRAVENOUS
  Administered 2021-10-05 (×3): 50 mg via INTRAVENOUS
  Administered 2021-10-05: 150 mg via INTRAVENOUS

## 2021-10-05 MED ORDER — MORPHINE SULFATE (PF) 2 MG/ML IV SOLN: 1.0000 mg | INTRAVENOUS | Status: DC | PRN

## 2021-10-05 MED ORDER — LABETALOL HCL 5 MG/ML IV SOLN: 10.0000 mg | INTRAVENOUS | Status: DC | PRN

## 2021-10-05 MED ORDER — ACETAMINOPHEN 10 MG/ML IV SOLN
INTRAVENOUS | Status: AC
  Filled 2021-10-05: qty 100

## 2021-10-05 MED ORDER — PROPOFOL 10 MG/ML IV BOLUS
INTRAVENOUS | Status: AC
  Filled 2021-10-05: qty 20

## 2021-10-05 MED ORDER — ACETAMINOPHEN 650 MG RE SUPP: 650.0000 mg | RECTAL | Status: DC | PRN

## 2021-10-05 MED ORDER — FERRIC CITRATE 1 GM 210 MG(FE) PO TABS
630.0000 mg | ORAL_TABLET | Freq: Three times a day (TID) | ORAL | Status: DC
Start: 2021-10-06 — End: 2021-10-13
  Filled 2021-10-05 (×23): qty 3

## 2021-10-05 MED ORDER — LABETALOL HCL 100 MG PO TABS
100.0000 mg | ORAL_TABLET | Freq: Two times a day (BID) | ORAL | Status: DC
Start: 2021-10-05 — End: 2021-10-06
  Administered 2021-10-06: 100 mg
  Filled 2021-10-05: qty 1

## 2021-10-05 MED ORDER — SENNA 8.6 MG PO TABS
1.0000 | ORAL_TABLET | Freq: Two times a day (BID) | ORAL | Status: DC
Start: 2021-10-05 — End: 2021-10-05

## 2021-10-05 MED ORDER — LIDOCAINE-EPINEPHRINE 0.5 %-1:200000 IJ SOLN
INTRAMUSCULAR | Status: AC
  Filled 2021-10-05: qty 1

## 2021-10-05 MED ORDER — CHLORHEXIDINE GLUCONATE 0.12 % MT SOLN
OROMUCOSAL | Status: AC
  Administered 2021-10-05: 15 mL via OROMUCOSAL
  Filled 2021-10-05: qty 15

## 2021-10-05 MED ORDER — CHLORHEXIDINE GLUCONATE CLOTH 2 % EX PADS
6.0000 | MEDICATED_PAD | Freq: Every day | CUTANEOUS | Status: DC
Start: 2021-10-06 — End: 2021-10-14
  Administered 2021-10-06 – 2021-10-14 (×9): 6 via TOPICAL

## 2021-10-05 MED ORDER — NALOXONE HCL 0.4 MG/ML IJ SOLN: 0.0800 mg | INTRAMUSCULAR | Status: DC | PRN

## 2021-10-05 MED ORDER — CALCITRIOL 0.25 MCG PO CAPS: 0.2500 ug | ORAL_CAPSULE | Freq: Every day | ORAL | Status: DC

## 2021-10-05 MED ORDER — TORSEMIDE 100 MG PO TABS: 100.0000 mg | ORAL_TABLET | Freq: Every day | ORAL | Status: DC

## 2021-10-05 MED ORDER — PHENYLEPHRINE HCL-NACL 20-0.9 MG/250ML-% IV SOLN: 0.0000 ug/min | INTRAVENOUS | Status: DC

## 2021-10-05 MED ORDER — CLEVIDIPINE BUTYRATE 0.5 MG/ML IV EMUL
INTRAVENOUS | Status: AC
  Filled 2021-10-05: qty 50

## 2021-10-05 MED ORDER — ONDANSETRON HCL 4 MG/2ML IJ SOLN
4.0000 mg | INTRAMUSCULAR | Status: DC | PRN
Start: 2021-10-05 — End: 2021-10-08

## 2021-10-05 MED ORDER — 0.9 % SODIUM CHLORIDE (POUR BTL) OPTIME
TOPICAL | Status: DC | PRN
  Administered 2021-10-05 (×3): 1000 mL

## 2021-10-05 MED ORDER — CLEVIDIPINE BUTYRATE 0.5 MG/ML IV EMUL
0.0000 mg/h | INTRAVENOUS | Status: DC
  Administered 2021-10-05: 1 mg/h via INTRAVENOUS
  Administered 2021-10-06: 2 mg/h via INTRAVENOUS
  Administered 2021-10-07 – 2021-10-08 (×2): 4 mg/h via INTRAVENOUS
  Administered 2021-10-08: 3 mg/h via INTRAVENOUS
  Filled 2021-10-05 (×7): qty 50

## 2021-10-05 MED ORDER — THROMBIN 20000 UNITS EX SOLR
CUTANEOUS | Status: DC | PRN
  Administered 2021-10-05: 20 mL via TOPICAL

## 2021-10-05 MED ORDER — CALCITRIOL 1 MCG/ML PO SOLN
0.2500 ug | Freq: Every day | ORAL | Status: DC
Start: 2021-10-06 — End: 2021-10-06
  Filled 2021-10-05: qty 0.25

## 2021-10-05 MED ORDER — POTASSIUM CHLORIDE IN NACL 20-0.9 MEQ/L-% IV SOLN
INTRAVENOUS | Status: DC
  Filled 2021-10-05: qty 1000

## 2021-10-05 MED ORDER — DOCUSATE SODIUM 50 MG/5ML PO LIQD
100.0000 mg | Freq: Two times a day (BID) | ORAL | Status: DC
Start: 2021-10-05 — End: 2021-10-06
  Administered 2021-10-06: 100 mg
  Filled 2021-10-05: qty 10

## 2021-10-05 MED ORDER — ONDANSETRON HCL 4 MG/2ML IJ SOLN
INTRAMUSCULAR | Status: DC | PRN
  Administered 2021-10-05: 4 mg via INTRAVENOUS

## 2021-10-05 MED ORDER — DEXAMETHASONE SODIUM PHOSPHATE 10 MG/ML IJ SOLN
INTRAMUSCULAR | Status: AC
  Filled 2021-10-05: qty 1

## 2021-10-05 MED ORDER — ROCURONIUM BROMIDE 10 MG/ML (PF) SYRINGE
PREFILLED_SYRINGE | INTRAVENOUS | Status: DC | PRN
  Administered 2021-10-05: 30 mg via INTRAVENOUS
  Administered 2021-10-05: 60 mg via INTRAVENOUS
  Administered 2021-10-05: 40 mg via INTRAVENOUS

## 2021-10-05 MED ORDER — HYDROCODONE-ACETAMINOPHEN 5-325 MG PO TABS
1.0000 | ORAL_TABLET | ORAL | Status: DC | PRN
  Administered 2021-10-06: 1
  Filled 2021-10-05: qty 1

## 2021-10-05 MED ORDER — BACITRACIN ZINC 500 UNIT/GM EX OINT
TOPICAL_OINTMENT | CUTANEOUS | Status: DC | PRN
  Administered 2021-10-05: 1 via TOPICAL

## 2021-10-05 MED ORDER — LEVETIRACETAM IN NACL 500 MG/100ML IV SOLN
500.0000 mg | Freq: Two times a day (BID) | INTRAVENOUS | Status: DC
  Administered 2021-10-05 – 2021-10-06 (×2): 500 mg via INTRAVENOUS
  Filled 2021-10-05 (×2): qty 100

## 2021-10-05 MED ORDER — SENNA 8.6 MG PO TABS
1.0000 | ORAL_TABLET | Freq: Two times a day (BID) | ORAL | Status: DC
  Administered 2021-10-06: 8.6 mg
  Filled 2021-10-05: qty 1

## 2021-10-05 MED ORDER — PHENYLEPHRINE HCL-NACL 20-0.9 MG/250ML-% IV SOLN
INTRAVENOUS | Status: DC | PRN
  Administered 2021-10-05: 20 ug/min via INTRAVENOUS

## 2021-10-05 SURGICAL SUPPLY — 69 items
BAG COUNTER SPONGE SURGICOUNT (BAG) ×3 IMPLANT
BENZOIN TINCTURE PRP APPL 2/3 (GAUZE/BANDAGES/DRESSINGS) IMPLANT
BIT DRILL WIRE PASS 1.3MM (BIT) IMPLANT
BLADE CLIPPER SURG (BLADE) ×2 IMPLANT
BLADE ULTRA TIP 2M (BLADE) ×2 IMPLANT
BNDG GAUZE ELAST 4 BULKY (GAUZE/BANDAGES/DRESSINGS) ×2 IMPLANT
BNDG STRETCH 4X75 NS LF (GAUZE/BANDAGES/DRESSINGS) ×1 IMPLANT
BUR ACORN 6.0 PRECISION (BURR) ×2 IMPLANT
BUR MATCHSTICK NEURO 3.0 LAGG (BURR) IMPLANT
BUR SPIRAL ROUTER 2.3 (BUR) ×1 IMPLANT
CANISTER SUCT 3000ML PPV (MISCELLANEOUS) ×3 IMPLANT
CARTRIDGE OIL MAESTRO DRILL (MISCELLANEOUS) ×1 IMPLANT
CLIP VESOCCLUDE MED 6/CT (CLIP) IMPLANT
COVER BURR HOLE 7 (Orthopedic Implant) ×3 IMPLANT
DIFFUSER DRILL AIR PNEUMATIC (MISCELLANEOUS) ×2 IMPLANT
DRAPE NEUROLOGICAL W/INCISE (DRAPES) ×2 IMPLANT
DRAPE SURG 17X23 STRL (DRAPES) IMPLANT
DRAPE WARM FLUID 44X44 (DRAPES) ×2 IMPLANT
DRILL WIRE PASS 1.3MM (BIT) ×2
DURAPREP 6ML APPLICATOR 50/CS (WOUND CARE) ×2 IMPLANT
ELECT REM PT RETURN 9FT ADLT (ELECTROSURGICAL) ×2
ELECTRODE REM PT RTRN 9FT ADLT (ELECTROSURGICAL) ×1 IMPLANT
EVACUATOR 1/8 PVC DRAIN (DRAIN) IMPLANT
EVACUATOR SILICONE 100CC (DRAIN) IMPLANT
GAUZE 4X4 16PLY ~~LOC~~+RFID DBL (SPONGE) IMPLANT
GAUZE SPONGE 4X4 12PLY STRL (GAUZE/BANDAGES/DRESSINGS) ×2 IMPLANT
GLOVE BIO SURGEON STRL SZ 6.5 (GLOVE) ×1 IMPLANT
GLOVE BIO SURGEON STRL SZ7 (GLOVE) ×2 IMPLANT
GLOVE ECLIPSE 6.5 STRL STRAW (GLOVE) ×2 IMPLANT
GLOVE EXAM NITRILE XL STR (GLOVE) IMPLANT
GOWN STRL REUS W/ TWL LRG LVL3 (GOWN DISPOSABLE) ×2 IMPLANT
GOWN STRL REUS W/ TWL XL LVL3 (GOWN DISPOSABLE) IMPLANT
GOWN STRL REUS W/TWL 2XL LVL3 (GOWN DISPOSABLE) IMPLANT
GOWN STRL REUS W/TWL LRG LVL3 (GOWN DISPOSABLE) ×6
GOWN STRL REUS W/TWL XL LVL3 (GOWN DISPOSABLE)
GRAFT DURAGEN MATRIX 5WX7L (Graft) ×1 IMPLANT
HEMOSTAT SURGICEL 2X14 (HEMOSTASIS) IMPLANT
HOOK DURA 1/2IN (MISCELLANEOUS) ×1 IMPLANT
KIT BASIN OR (CUSTOM PROCEDURE TRAY) ×2 IMPLANT
KIT TURNOVER KIT B (KITS) ×2 IMPLANT
NDL HYPO 25X1 1.5 SAFETY (NEEDLE) ×1 IMPLANT
NEEDLE HYPO 25X1 1.5 SAFETY (NEEDLE) ×2 IMPLANT
NS IRRIG 1000ML POUR BTL (IV SOLUTION) ×6 IMPLANT
OIL CARTRIDGE MAESTRO DRILL (MISCELLANEOUS) ×2
PACK BATTERY CMF DISP FOR DVR (ORTHOPEDIC DISPOSABLE SUPPLIES) ×1 IMPLANT
PACK CRANIOTOMY CUSTOM (CUSTOM PROCEDURE TRAY) ×2 IMPLANT
PATTIES SURGICAL .5 X.5 (GAUZE/BANDAGES/DRESSINGS) IMPLANT
PATTIES SURGICAL .5 X3 (DISPOSABLE) IMPLANT
PATTIES SURGICAL 1X1 (DISPOSABLE) IMPLANT
PLATE UNIV CMF LG 4H (Plate) ×1 IMPLANT
SCREW UNIII AXS SD 1.5X4 (Screw) ×18 IMPLANT
SPONGE NEURO XRAY DETECT 1X3 (DISPOSABLE) IMPLANT
SPONGE SURGIFOAM ABS GEL 100 (HEMOSTASIS) ×2 IMPLANT
STAPLER VISISTAT 35W (STAPLE) ×3 IMPLANT
STOCKINETTE TUBULAR 6 INCH (GAUZE/BANDAGES/DRESSINGS) ×1 IMPLANT
SUT ETHILON 3 0 FSL (SUTURE) IMPLANT
SUT ETHILON 3 0 PS 1 (SUTURE) IMPLANT
SUT NURALON 4 0 TR CR/8 (SUTURE) ×5 IMPLANT
SUT STEEL 0 (SUTURE)
SUT STEEL 0 18XMFL TIE 17 (SUTURE) IMPLANT
SUT VIC AB 2-0 CT2 18 VCP726D (SUTURE) ×6 IMPLANT
TAPE SURG TRANSPORE 1 IN (GAUZE/BANDAGES/DRESSINGS) IMPLANT
TAPE SURGICAL TRANSPORE 1 IN (GAUZE/BANDAGES/DRESSINGS) ×2
TOWEL GREEN STERILE (TOWEL DISPOSABLE) ×2 IMPLANT
TOWEL GREEN STERILE FF (TOWEL DISPOSABLE) ×2 IMPLANT
TRAY FOLEY MTR SLVR 16FR STAT (SET/KITS/TRAYS/PACK) ×2 IMPLANT
TUBE CONNECTING 12X1/4 (SUCTIONS) ×2 IMPLANT
UNDERPAD 30X36 HEAVY ABSORB (UNDERPADS AND DIAPERS) ×2 IMPLANT
WATER STERILE IRR 1000ML POUR (IV SOLUTION) ×2 IMPLANT

## 2021-10-05 NOTE — Anesthesia Postprocedure Evaluation (Signed)
Anesthesia Post Note  Patient: Daniel Solomon  Procedure(s) Performed: CRANIOTOMY HEMATOMA EVACUATION SUBDURAL (Left: Head)     Patient location during evaluation: SICU Anesthesia Type: General Level of consciousness: sedated Pain management: pain level controlled Vital Signs Assessment: post-procedure vital signs reviewed and stable Respiratory status: patient remains intubated per anesthesia plan Cardiovascular status: stable Postop Assessment: no apparent nausea or vomiting Anesthetic complications: no   No notable events documented.  Last Vitals:  Vitals:   10/05/21 2154 10/05/21 2200  BP: 128/70 110/79  Pulse: 80 85  Resp: 18   Temp: (!) 36.3 C   SpO2: 100% 100%    Last Pain:  Vitals:   10/05/21 2154  TempSrc: Axillary  PainSc:                  Meoshia Billing,W. EDMOND

## 2021-10-05 NOTE — H&P (Signed)
Daniel Solomon is a 61 y.o. male With a known subacute chronic left subdural hematoma. He was treated with Middle meningeal artery embolism on 8/1,/23 by my partner. Came in today because repeat Head CT showed an increasing amount of subdural blood and left to right shift.  He is alert, no complaints of a headache. His wife points out that his cognition is severely impaired. He threw up while I was conducting my interview.  No Known Allergies Past Medical History:  Diagnosis Date   Anemia    Bicuspid aortic valve    Blood transfusion without reported diagnosis    CAD (coronary artery disease)    proximal LAD 50%, mid LAD 50-60%, mid to distal LAD 99%; mid circumflex 20%; proximal RCA 20%, distal RCA 50%; PDA 60-70%, proximal posterior AV groove 50%, proximal posterior lateral 50%.  There was a question of possible LVOT gradient;  s/p Resolute DES to mid-distal LAD 10/2010;  echocardiogram 11/17/10: EF 55-60%, mild LVH, grade 1 diastolic dysfunction, normal aortic valve, mild MR, PASP 32.    Chicken pox    Enlarged aorta ([name])    ESRD on peritoneal dialysis ([name])    Glaucoma    Gout    allopurinol '300mg'$ , colchicine prn in past, has not had flares   Headache    History of kidney stones    Hypercholesterolemia    With hypertriglyceridemia   Hypertension    Mild aortic stenosis    PAF (paroxysmal atrial fibrillation) ([place])    a. Documented on event monitor 01/2016.   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   SDH (subdural hematoma) ([place]) 08/2021   left   Sleep apnea    cpap nightly   Past Surgical History:  Procedure Laterality Date   CORONARY STENT PLACEMENT  2012   CYSTOSCOPY/RETROGRADE/URETEROSCOPY  03/26/2012   Procedure: CYSTOSCOPY/RETROGRADE/URETEROSCOPY;   Surgeon: [name], MD;  Location: [facility];  Service: Urology;  Laterality: Bilateral;  CYSTOSCOPY BILATERAL RETROGRADE PYELOGRAM AND POSSIBLE URETEROSCOPY   INTRAOPERATIVE TRANSTHORACIC ECHOCARDIOGRAM N/A 01/19/2021   Procedure: INTRAOPERATIVE TRANSTHORACIC ECHOCARDIOGRAM;  Surgeon: Burnell Blanks, MD;  Location: [place];  Service: Open Heart Surgery;  Laterality: N/A;   IR ANGIO EXTERNAL CAROTID SEL EXT CAROTID UNI L MOD SED  09/28/2021   IR ANGIO INTRA EXTRACRAN SEL INTERNAL CAROTID UNI L MOD SED  09/28/2021   IR ANGIOGRAM FOLLOW UP STUDY  09/28/2021   IR ANGIOGRAM FOLLOW UP STUDY  09/30/2021   IR NEURO EACH ADD'L AFTER BASIC UNI LEFT (MS)  09/28/2021   IR NEURO EACH ADD'L AFTER BASIC UNI RIGHT (MS)  09/30/2021   IR TRANSCATH/EMBOLIZ  09/28/2021   RADIOLOGY WITH ANESTHESIA N/A 09/28/2021   Procedure: Middle meningeal artery embolization;  Surgeon: Consuella Lose, MD;  Location: [place];  Service: Radiology;  Laterality: N/A;   RIGHT HEART CATH AND CORONARY ANGIOGRAPHY N/A 12/04/2020   Procedure: RIGHT HEART CATH AND CORONARY ANGIOGRAPHY;  Surgeon: Burnell Blanks, MD;  Location: [place] [facility];  Service: Cardiovascular;  Laterality: N/A;   TRANSCATHETER AORTIC VALVE REPLACEMENT, TRANSFEMORAL N/A 01/19/2021   Procedure: TRANSCATHETER AORTIC VALVE REPLACEMENT, TRANSFEMORAL USING A 26MM EDWARDS VALVE.;  Surgeon: Burnell Blanks, MD;  Location: [facility]  [facility];  Service: Open Heart Surgery;  Laterality: N/A;   Social History   Socioeconomic History   Marital status: Married    Spouse name: Not on file   Number of children: 4   Years of education: Not on file   Highest education level: Not on file  Occupational History   Occupation: Lawyer: [employer]  Tobacco Use   Smoking status: Never   Smokeless tobacco: Never  Vaping Use   Vaping Use: Never used  Substance and Sexual Activity   Alcohol use: No   Drug use: No   Sexual activity: Not  on file  Other Topics Concern   Not on file  Social History Narrative   Family: Married, 4 children, 9 grandkids      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)   BS at [employer]: reading, church activities- [employer]   Social Determinants of Health   Financial Resource Strain: Not on file  Food Insecurity: Not on file  Transportation Needs: Not on file  Physical Activity: Not on file  Stress: Not on file  Social Connections: Not on file  Intimate Partner Violence: Not on file   Family History  Problem Relation Age of Onset   Hypertension Father    CAD Father        CABG 17s   Kidney disease Father        Stage IV   Valvular heart disease Mother        AVR July 2015   Diabetes Paternal Grandmother    Colon cancer Neg Hx    Prior to Admission medications   Medication Sig Start Date End Date Taking? Authorizing Provider  allopurinol (ZYLOPRIM) 300 MG tablet TAKE 1 TABLET BY MOUTH EVERY DAY 04/19/21   Marin Olp, MD  amoxicillin (AMOXIL) 500 MG tablet Take 4 tablets by mouth 1 hour prior to dental appointments and cleanings Patient taking differently: Take 2,000 mg by mouth See admin instructions. Take 4 tablets by mouth 1 hour prior to dental appointments and cleanings 01/27/21   Eileen Stanford, PA-C  atorvastatin (LIPITOR) 40 MG tablet TAKE 1 TABLET BY MOUTH EVERY DAY 02/16/21   Marin Olp, MD  AURYXIA 1 GM 210 MG(Fe) tablet Take 630 mg by mouth 3 (three) times daily with meals. 12/23/20   [provider]  calcitRIOL (ROCALTROL) 0.25 MCG capsule Take 0.25 mcg by mouth daily. 01/29/21   [provider]  docusate sodium (COLACE) 100 MG capsule Take 100 mg by mouth daily.    [provider]  gentamicin ointment (GARAMYCIN) 0.1 % Apply 1 application topically daily.    [provider]  HYDROcodone-acetaminophen (NORCO/VICODIN) 5-325 MG  tablet Take 1 tablet by mouth every 6 (six) hours as needed for moderate pain or severe pain. 1-2 tabs po tid prn 09/23/21   Lavina Hamman, MD  iron sucrose in sodium chloride 0.9 % 100 mL Iron Sucrose (Venofer) 11/23/20   [provider]  labetalol (NORMODYNE) 100 MG tablet Take 100 mg by mouth 2 (two) times daily. 04/30/20   [provider]  levETIRAcetam (KEPPRA) 500 MG tablet Take 1 tablet (500 mg total) by mouth 2 (two) times daily for 7 days. 09/23/21 09/30/21  Lavina Hamman, MD  methocarbamol (ROBAXIN) 500 MG tablet Take 2 tablets (1,000 mg total)  by mouth 4 (four) times daily as needed (for Headache). START with 1 pill, if not too drowsy take 2 pills. Patient not taking: Reported on 09/24/2021 09/09/21   Jeanie Sewer, NP  Methoxy PEG-Epoetin Beta (MIRCERA IJ) Inject into the skin as needed. 06/15/20   [provider]  torsemide (DEMADEX) 100 MG tablet Take 1 tablet (100 mg total) by mouth daily. 08/25/21   Burnell Blanks, MD   He has not taken eliquis since before the embolization Physical Exam Constitutional:      Appearance: He is ill-appearing.  HENT:     Head: Normocephalic and atraumatic.     Right Ear: External ear normal.     Left Ear: External ear normal.     Nose: Nose normal.     Mouth/Throat:     Mouth: Mucous membranes are moist.     Pharynx: Oropharynx is clear.  Eyes:     Extraocular Movements: Extraocular movements intact.     Conjunctiva/sclera: Conjunctivae normal.     Pupils: Pupils are equal, round, and reactive to light.  Pulmonary:     Effort: Pulmonary effort is normal.  Abdominal:     General: Abdomen is flat.     Palpations: Abdomen is soft.  Musculoskeletal:        General: Normal range of motion.     Cervical back: Normal range of motion.  Skin:    General: Skin is warm and dry.  Neurological:     General: No focal deficit present.     Mental Status: He is alert and oriented to person, place, and time.      Cranial Nerves: Cranial nerves 2-12 are intact. No cranial nerve deficit.     Sensory: Sensation is intact. No sensory deficit.     Motor: Motor function is intact. No weakness.     Coordination: Coordination is intact. Coordination normal.     Gait: Gait normal.     Deep Tendon Reflexes: Reflexes normal. Babinski sign absent on the right side. Babinski sign absent on the left side.  Psychiatric:        Mood and Affect: Mood normal.     Comments: Much slower processing verbally.   Assessment/Plan Mr. Brandenburg  repeat CT scan shows an enlarging subdural hematoma with increasing shift. I have recommended operative evacuation via a craniotomy. Risks including but not limited too bleeding, infection, need for further surgery, recurrence of blood, stroke, coma, death, possible seizures and other risks Mrs. Gelpi understands and wishes to proceed.

## 2021-10-05 NOTE — ED Triage Notes (Signed)
Patient here for evaluation of having worsening aphasia, ataxia, and poor mental clarity for the last month since the patient experienced a brain bleed. Patient had brain surgery one week ago, wife states symptoms have been worse after surgery.

## 2021-10-05 NOTE — Anesthesia Preprocedure Evaluation (Signed)
Anesthesia Evaluation  Patient identified by MRN, date of birth, ID band Patient awake    Reviewed: Allergy & Precautions, H&P , NPO status , Patient's Chart, lab work & pertinent test results  Airway Mallampati: II   Neck ROM: full    Dental   Pulmonary sleep apnea ,    breath sounds clear to auscultation       Cardiovascular hypertension, + CAD and + Cardiac Stents  + Valvular Problems/Murmurs  Rhythm:regular Rate:Normal  S/p TAVR for bicuspid aortic valve.   Neuro/Psych  Headaches,    GI/Hepatic   Endo/Other    Renal/GU ESRF and DialysisRenal disease     Musculoskeletal   Abdominal   Peds  Hematology   Anesthesia Other Findings   Reproductive/Obstetrics                             Anesthesia Physical Anesthesia Plan  ASA: 3  Anesthesia Plan: General   Post-op Pain Management:    Induction: Intravenous  PONV Risk Score and Plan: 2 and Ondansetron, Dexamethasone, Treatment may vary due to age or medical condition and Midazolam  Airway Management Planned: Oral ETT  Additional Equipment: Arterial line  Intra-op Plan:   Post-operative Plan: Extubation in OR  Informed Consent: I have reviewed the patients History and Physical, chart, labs and discussed the procedure including the risks, benefits and alternatives for the proposed anesthesia with the patient or authorized representative who has indicated his/her understanding and acceptance.     Dental advisory given  Plan Discussed with: CRNA, Anesthesiologist and Surgeon  Anesthesia Plan Comments:         Anesthesia Quick Evaluation

## 2021-10-05 NOTE — Consult Note (Signed)
NAME:  Daniel Solomon, MRN:  [id number], DOB:  December 04, 1960, LOS: 0 ADMISSION DATE:  10/05/2021 CONSULTATION DATE:  10/05/2021 REFERRING MD:  Christella Noa - NSGY CHIEF COMPLAINT:  Post-procedure vent management   History of Present Illness:  61 year old man who presented to [facility] ED 8/8 with worsening aphasia, ataxia and mental fogginess. Recent admissions 7/26-7/27 for subacute chronic L SDH and 8/1-8/2 for elective L MMA embolization (Dr. Kathyrn Sheriff).   PMHx significant for HTN, HLD, PAF (previously on Eliquis, stopped during 7/26 admission), CAD (s/p DES x 1 to mid-distal LAD 2012), mild AS with bicuspid AV s/p TAVR (12/2020), OSA (on CPAP), ESRD on nightly PD, nephrolithiasis, gout, glaucoma.  Patient initially presented to ED 8/8 with persistent symptoms post-L MMA embolization 1 week PTA; was recommended by NSGY to come to ED for evaluation. No major focal neurologic deficits on presentation per EDP. CT Head was completed in the setting of recent NSGY intervention and demonstrated enlarging SDH with increased mass effect. NSGY was reconsulted with decision for SDH evacuation and craniotomy 8/8PM (Dr. Christella Noa).  PCCM conuslted for post-procedure vent/medical management.  Pertinent Medical History:   Past Medical History:  Diagnosis Date   Anemia    Bicuspid aortic valve    Blood transfusion without reported diagnosis    CAD (coronary artery disease)    proximal LAD 50%, mid LAD 50-60%, mid to distal LAD 99%; mid circumflex 20%; proximal RCA 20%, distal RCA 50%; PDA 60-70%, proximal posterior AV groove 50%, proximal posterior lateral 50%.  There was a question of possible LVOT gradient;  s/p Resolute DES to mid-distal LAD 10/2010;  echocardiogram 11/17/10: EF 55-60%, mild LVH, grade 1 diastolic dysfunction, normal aortic valve, mild MR, PASP 32.    Chicken pox    Enlarged aorta ([name])    ESRD on peritoneal dialysis ([name])    Glaucoma    Gout    allopurinol '300mg'$ , colchicine prn in past, has not had  flares   Headache    History of kidney stones    Hypercholesterolemia    With hypertriglyceridemia   Hypertension    Mild aortic stenosis    PAF (paroxysmal atrial fibrillation) ([place])    a. Documented on event monitor 01/2016.   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   SDH (subdural hematoma) ([place]) 08/2021   left   Sleep apnea    cpap nightly   Significant Hospital Events: Including procedures, antibiotic start and stop dates in addition to other pertinent events   7/26-7/27 - Admission for subacute chronic L SDH, no intervention 8/1-8/2 - Admission for elective L MMA embolization (Dr. Kathyrn Sheriff) 8/8 - Presented to [facility] ED with worsening ataxia, aphasia and mental fogginess. No significant focal neuro deficits in ED; CT Head completed ( given recent NSGY intervention) with increased SDH and mass effect. Taken to OR with NSGY ([place]) for SDH evacuation/crani. PCCM consulted for post-procedure vent management.  Interim History / Subjective:  PCCM consulted for post-procedure vent management  Objective:  Blood pressure 128/70, pulse 80, temperature (!) 97.3 F (36.3 C), temperature source Axillary, resp. rate 18, height '5\' 8"'$  (1.727 m), weight 88 kg, SpO2 100 %.    Vent Mode: PRVC FiO2 (%):  [50 %] 50 % Set Rate:  [18 bmp] 18 bmp Vt Set:  [540 mL] 540 mL PEEP:  [5 cmH20] 5 cmH20 Plateau Pressure:  [  15 cmH20] 15 cmH20   Intake/Output Summary (Last 24 hours) at 10/05/2021 2203 Last data filed at 10/05/2021 2051 Gross per 24 hour  Intake 650 ml  Output 800 ml  Net -150 ml   Filed Weights   10/05/21 1716  Weight: 88 kg   Physical Examination: General: Acutely ill-appearing middle-aged man in NAD. Intubated, sedated. HEENT: Postoperative bandages in place, staple closure per op note. Mild facial/periorbital edema. Anicteric sclera, pupils  39m, pinpoint, equal, moist mucous membranes. ETT in place. Neuro: Deeply sedated. Does not respond to verbal, tactile or noxious stimuli. Not following commands. No spontaneous movement of extremities in the setting of deep sedation. CV: Irregularly irregular rhythm, II/VI systolic murmur. PULM: Breathing even and unlabored on vent (PEEP 5, FiO2 50%). Lung fields CTAB. GI: Soft, nontender, nondistended. Normoactive bowel sounds. +PD catheter in place to RLQ without surrounding erythema or drainage. Extremities: No LE edema noted. Skin: Warm/dry, no rashes.  Resolved Hospital Problem List:    Assessment & Plan:  Left subacute SDH with brain compression S/p SDH evacuation and craniotomy 8/8 (Dr. CChristella Noa - Postoperative management per NSGY - Goal SBP > 90, < 140 - Cleviprex, Neosynephrine titrated to SBP goal - Repeat brain imaging per NSGY - Keppra for seizure prophylaxis  Respiratory insufficiency, periprocedural requring MV OSA - Continue full vent support (4-8cc/kg IBW) - Likely able to extubate 8/9, will lighten sedation and attempt weaning 8/9AM - Wean FiO2 for O2 sat > 90% - Daily WUA/SBT - VAP bundle - Pulmonary hygiene - PAD protocol for sedation: Propofol and Fentanyl for goal RASS -1 to -2 - Will require CPAP QHS when extubated, may temporarily need BiPAP PRN (pending progress with extubation)  PAF Previously on Eliquis, discontinued 7/26 admission in the setting of SDH. - Risk/benefit of AC resumption to be discussed with NSGY, Cardiology - Cardiac monitoring  CAD Mild AS s/p TAVR S/p DES x 1 to mid-distal LAD 2012, s/p TAVR 12/2020 (bioprosthetic, Edwards Sapien 3 THV).  HTN - Goal SBP < 140 per NSGY - Cleviprex titrated to goal SBP - Continue home labetalol to allow Cleviprex weaning as able  HLD - Continue statin  ESRD on PD - Nephrology consulted, will follow for PD - PD nightly beginning 8/9PM, labs ok to hold 8/8PM - Trend BMP - Replete  electrolytes as indicated - Monitor I&Os - Resume home torsemide as appropriate - Avoid nephrotoxic agents as able - Ensure adequate renal perfusion - Resume home binders when taking PO  Gout - Resume home allopurinol as clinically appropriate  Best Practice: (right click and "Reselect all SmartList Selections" daily)   Diet/type: NPO w/ meds via tube DVT prophylaxis: SCDs, AC resumption per NSGY GI prophylaxis: PPI Lines: Arterial Line Foley:  N/A Code Status:  full code Last date of multidisciplinary goals of care discussion [Per Primary]  Labs:  CBC: Recent Labs  Lab 10/05/21 1250  WBC 7.1  NEUTROABS 5.2  HGB 11.0*  HCT 32.9*  MCV 103.1*  PLT 1426   Basic Metabolic Panel: Recent Labs  Lab 10/05/21 1250  NA 139  K 4.3  CL 97*  CO2 24  GLUCOSE 105*  BUN 95*  CREATININE 18.17*  CALCIUM 9.7   GFR: Estimated Creatinine Clearance: 4.7 mL/min (A) (by C-G formula based on SCr of 18.17 mg/dL (H)). Recent Labs  Lab 10/05/21 1250  WBC 7.1   Liver Function Tests: No results for input(s): "AST", "ALT", "ALKPHOS", "BILITOT", "PROT", "ALBUMIN" in the last 168 hours. No results for  input(s): "LIPASE", "AMYLASE" in the last 168 hours. No results for input(s): "AMMONIA" in the last 168 hours.  ABG:    Component Value Date/Time   PHART 7.403 01/19/2021 0825   PCO2ART 34.6 01/19/2021 0825   PO2ART 137 (H) 01/19/2021 0825   HCO3 21.6 01/19/2021 0825   TCO2 26 09/28/2021 1332   ACIDBASEDEF 3.0 (H) 01/19/2021 0825   O2SAT 99.0 01/19/2021 0825   Coagulation Profile: No results for input(s): "INR", "PROTIME" in the last 168 hours.  Cardiac Enzymes: No results for input(s): "CKTOTAL", "CKMB", "CKMBINDEX", "TROPONINI" in the last 168 hours.  HbA1C: Hgb A1c MFr Bld  Date/Time Value Ref Range Status  01/12/2016 08:49 AM 5.1 <5.7 % Final    Comment:      For the purpose of screening for the presence of diabetes:   <5.7%       Consistent with the absence of  diabetes 5.7-6.4 %   Consistent with increased risk for diabetes (prediabetes) >=6.5 %     Consistent with diabetes   This assay result is consistent with a decreased risk of diabetes.   Currently, no consensus exists regarding use of hemoglobin A1c for diagnosis of diabetes in children.   According to American Diabetes Association (ADA) guidelines, hemoglobin A1c <7.0% represents optimal control in non-pregnant diabetic patients. Different metrics may apply to specific patient populations. Standards of Medical Care in Diabetes (ADA).     11/16/2010 06:27 PM 5.7 (H) <5.7 % Final    Comment:    (NOTE)                                                                       According to the ADA Clinical Practice Recommendations for 2011, when HbA1c is used as a screening test:  >=6.5%   Diagnostic of Diabetes Mellitus           (if abnormal result is confirmed) 5.7-6.4%   Increased risk of developing Diabetes Mellitus References:Diagnosis and Classification of Diabetes Mellitus,Diabetes ERDE,0814,48(JEHUD 1):S62-S69 and Standards of Medical Care in         Diabetes - 2011,Diabetes JSHF,0263,78 (Suppl 1):S11-S61.   CBG: No results for input(s): "GLUCAP" in the last 168 hours.  Review of Systems:   Patient is encephalopathic and/or intubated. Therefore history has been obtained from chart review.   Past Medical History:  He,  has a past medical history of Anemia, Bicuspid aortic valve, Blood transfusion without reported diagnosis, CAD (coronary artery disease), Chicken pox, Enlarged aorta ([place]), ESRD on peritoneal dialysis ([place]), Glaucoma, Gout, Headache, History of kidney stones, Hypercholesterolemia, Hypertension, Mild aortic stenosis, PAF (paroxysmal atrial fibrillation) ([place]), Premature atrial contractions, S/P TAVR (transcatheter aortic valve replacement) (01/19/2021), SDH (subdural hematoma) ([place]) (08/2021), and Sleep apnea.   Surgical History:   Past Surgical History:   Procedure Laterality Date   CORONARY STENT PLACEMENT  2012   CYSTOSCOPY/RETROGRADE/URETEROSCOPY  03/26/2012   Procedure: CYSTOSCOPY/RETROGRADE/URETEROSCOPY;  Surgeon: [name], MD;  Location: [facility];  Service: Urology;  Laterality: Bilateral;  CYSTOSCOPY BILATERAL RETROGRADE PYELOGRAM AND POSSIBLE URETEROSCOPY   INTRAOPERATIVE TRANSTHORACIC ECHOCARDIOGRAM N/A 01/19/2021   Procedure: INTRAOPERATIVE TRANSTHORACIC ECHOCARDIOGRAM;  Surgeon: Burnell Blanks, MD;  Location: [place];  Service: Open Heart Surgery;  Laterality: N/A;  IR ANGIO EXTERNAL CAROTID SEL EXT CAROTID UNI L MOD SED  09/28/2021   IR ANGIO INTRA EXTRACRAN SEL INTERNAL CAROTID UNI L MOD SED  09/28/2021   IR ANGIOGRAM FOLLOW UP STUDY  09/28/2021   IR ANGIOGRAM FOLLOW UP STUDY  09/30/2021   IR NEURO EACH ADD'L AFTER BASIC UNI LEFT (MS)  09/28/2021   IR NEURO EACH ADD'L AFTER BASIC UNI RIGHT (MS)  09/30/2021   IR TRANSCATH/EMBOLIZ  09/28/2021   RADIOLOGY WITH ANESTHESIA N/A 09/28/2021   Procedure: Middle meningeal artery embolization;  Surgeon: Consuella Lose, MD;  Location: [place];  Service: Radiology;  Laterality: N/A;   RIGHT HEART CATH AND CORONARY ANGIOGRAPHY N/A 12/04/2020   Procedure: RIGHT HEART CATH AND CORONARY ANGIOGRAPHY;  Surgeon: Burnell Blanks, MD;  Location: [place] [facility];  Service: Cardiovascular;  Laterality: N/A;   TRANSCATHETER AORTIC VALVE REPLACEMENT, TRANSFEMORAL N/A 01/19/2021   Procedure: TRANSCATHETER AORTIC VALVE REPLACEMENT, TRANSFEMORAL USING A 26MM EDWARDS VALVE.;  Surgeon: Burnell Blanks, MD;  Location: [place];  Service: Open Heart Surgery;  Laterality: N/A;   Social History:   reports that he has never smoked. He has never used smokeless tobacco. He reports that he does not drink alcohol and does not use drugs.   Family History:  His family history includes CAD in his father; Diabetes in his paternal grandmother; Hypertension in his father; Kidney disease in  his father; Valvular heart disease in his mother. There is no history of Colon cancer.   Allergies: No Known Allergies   Home Medications: Prior to Admission medications   Medication Sig Start Date End Date Taking? Authorizing Provider  allopurinol (ZYLOPRIM) 300 MG tablet TAKE 1 TABLET BY MOUTH EVERY DAY Patient taking differently: Take 300 mg by mouth daily. 04/19/21  Yes Marin Olp, MD  atorvastatin (LIPITOR) 40 MG tablet TAKE 1 TABLET BY MOUTH EVERY DAY Patient taking differently: Take 40 mg by mouth daily. 02/16/21  Yes Marin Olp, MD  AURYXIA 1 GM 210 MG(Fe) tablet Take 630 mg by mouth 3 (three) times daily with meals. 12/23/20  Yes [provider]  calcitRIOL (ROCALTROL) 0.25 MCG capsule Take 0.25 mcg by mouth daily. 01/29/21  Yes [provider]  docusate sodium (COLACE) 100 MG capsule Take 100 mg by mouth daily.   Yes [provider]  gentamicin ointment (GARAMYCIN) 0.1 % Apply 1 application topically daily.   Yes [provider]  iron sucrose in sodium chloride 0.9 % 100 mL Iron Sucrose (Venofer) 11/23/20  Yes [provider]  labetalol (NORMODYNE) 100 MG tablet Take 100 mg by mouth 2 (two) times daily. 04/30/20  Yes [provider]  Methoxy PEG-Epoetin Beta (MIRCERA IJ) Inject into the skin as needed. 06/15/20  Yes [provider]  torsemide (DEMADEX) 100 MG tablet Take 1 tablet (100 mg total) by mouth daily. 08/25/21  Yes Burnell Blanks, MD  HYDROcodone-acetaminophen (NORCO/VICODIN) 5-325 MG tablet Take 1 tablet by mouth every 6 (six) hours as needed for moderate pain or severe pain. 1-2 tabs po tid prn Patient not taking: Reported on 10/05/2021 09/23/21   Lavina Hamman, MD  levETIRAcetam (KEPPRA) 500 MG tablet Take 1 tablet (500 mg total) by mouth 2 (two) times daily for 7 days. 09/23/21 09/30/21  Lavina Hamman, MD  methocarbamol (ROBAXIN) 500 MG tablet Take 2 tablets (1,000 mg total) by mouth 4 (four)  times daily as needed (for Headache). START with 1 pill, if not too drowsy take 2 pills.  Patient not taking: Reported on 09/24/2021 09/09/21   Jeanie Sewer, NP   Critical care time: 49 minutes   Lestine Mount, [facility] 10/05/21 10:03 PM  Please see [email or web address] for pager details.  From 7A-7P if no response, please call [phone] After hours, please call ELink [phone]

## 2021-10-05 NOTE — Anesthesia Procedure Notes (Signed)
Procedure Name: Intubation Date/Time: 10/05/2021 6:43 PM  Performed by: Reece Agar, CRNAPre-anesthesia Checklist: Patient identified, Emergency Drugs available, Suction available and Patient being monitored Patient Re-evaluated:Patient Re-evaluated prior to induction Oxygen Delivery Method: Circle System Utilized Preoxygenation: Pre-oxygenation with 100% oxygen Induction Type: IV induction Ventilation: Mask ventilation without difficulty Laryngoscope Size: Mac and 4 Grade View: Grade I Tube type: Oral Tube size: 7.5 mm Number of attempts: 1 Airway Equipment and Method: Stylet Placement Confirmation: ETT inserted through vocal cords under direct vision, positive ETCO2 and breath sounds checked- equal and bilateral Secured at: 22 cm Tube secured with: Tape Dental Injury: Teeth and Oropharynx as per pre-operative assessment

## 2021-10-05 NOTE — Progress Notes (Signed)
[place] Progress Note Patient Name: Daniel Solomon DOB: September 20, 1960 MRN: [id number]   Date of Service  10/05/2021  HPI/Events of Note  Patient is s/p a craniotomy to evacuate a large subdural hematoma with mass effect, and was admitted to the neuro ICU post-operatively, intubated and sedated, critical care consulted for ventilator management.  eICU Interventions  New Patient Evaluation.        Kerry Kass Pacey [place] 10/05/2021, 11:05 PM

## 2021-10-05 NOTE — ED Provider Notes (Signed)
[facility] EMERGENCY DEPARTMENT Provider Note   CSN: [id number] Arrival date & time: 10/05/21  1146     History  Chief Complaint  Patient presents with   Altered Mental Status    Daniel Solomon is a 60 y.o. male.  The history is provided by the patient and medical records. No language interpreter was used.  Altered Mental Status Presenting symptoms: confusion   Severity:  Mild Most recent episode:  More than 2 days ago Episode history:  Continuous Timing:  Constant Progression:  Waxing and waning Chronicity:  New Associated symptoms: headaches and weakness (per fam r foot)   Associated symptoms: no abdominal pain, no agitation, no fever, no hallucinations, no light-headedness, no nausea, no palpitations, no rash, no seizures, no slurred speech, no visual change and no vomiting   Headaches:    Severity:  Moderate      Home Medications Prior to Admission medications   Medication Sig Start Date End Date Taking? Authorizing Provider  allopurinol (ZYLOPRIM) 300 MG tablet TAKE 1 TABLET BY MOUTH EVERY DAY 04/19/21   Marin Olp, MD  amoxicillin (AMOXIL) 500 MG tablet Take 4 tablets by mouth 1 hour prior to dental appointments and cleanings Patient taking differently: Take 2,000 mg by mouth See admin instructions. Take 4 tablets by mouth 1 hour prior to dental appointments and cleanings 01/27/21   Eileen Stanford, PA-C  atorvastatin (LIPITOR) 40 MG tablet TAKE 1 TABLET BY MOUTH EVERY DAY 02/16/21   Marin Olp, MD  AURYXIA 1 GM 210 MG(Fe) tablet Take 630 mg by mouth 3 (three) times daily with meals. 12/23/20   [provider]  calcitRIOL (ROCALTROL) 0.25 MCG capsule Take 0.25 mcg by mouth daily. 01/29/21   [provider]  docusate sodium (COLACE) 100 MG capsule Take 100 mg by mouth daily.    [provider]  gentamicin ointment (GARAMYCIN) 0.1 % Apply 1 application topically daily.    [provider]   HYDROcodone-acetaminophen (NORCO/VICODIN) 5-325 MG tablet Take 1 tablet by mouth every 6 (six) hours as needed for moderate pain or severe pain. 1-2 tabs po tid prn 09/23/21   Lavina Hamman, MD  iron sucrose in sodium chloride 0.9 % 100 mL Iron Sucrose (Venofer) 11/23/20   [provider]  labetalol (NORMODYNE) 100 MG tablet Take 100 mg by mouth 2 (two) times daily. 04/30/20   [provider]  levETIRAcetam (KEPPRA) 500 MG tablet Take 1 tablet (500 mg total) by mouth 2 (two) times daily for 7 days. 09/23/21 09/30/21  Lavina Hamman, MD  methocarbamol (ROBAXIN) 500 MG tablet Take 2 tablets (1,000 mg total) by mouth 4 (four) times daily as needed (for Headache). START with 1 pill, if not too drowsy take 2 pills. Patient not taking: Reported on 09/24/2021 09/09/21   Jeanie Sewer, NP  Methoxy PEG-Epoetin Beta (MIRCERA IJ) Inject into the skin as needed. 06/15/20   [provider]  torsemide (DEMADEX) 100 MG tablet Take 1 tablet (100 mg total) by mouth daily. 08/25/21   Burnell Blanks, MD      Allergies    Patient has no known allergies.    Review of Systems   Review of Systems  Constitutional:  Negative for chills, diaphoresis, fatigue and fever.  HENT:  Negative for congestion.   Eyes:  Negative for visual disturbance.  Respiratory:  Negative for cough, chest tightness and shortness of breath.   Cardiovascular:  Negative for palpitations.  Gastrointestinal:  Negative  for abdominal pain, constipation, diarrhea, nausea and vomiting.  Genitourinary:  Negative for dysuria and flank pain.  Musculoskeletal:  Negative for back pain, neck pain and neck stiffness.  Skin:  Negative for rash and wound.  Neurological:  Positive for speech difficulty, weakness (per fam r foot) and headaches. Negative for seizures and light-headedness.  Psychiatric/Behavioral:  Positive for confusion. Negative for agitation and hallucinations.   All other systems reviewed and are  negative.   Physical Exam Updated Vital Signs BP (!) 145/99 (BP Location: Right Arm)   Pulse 70   Temp 98 F (36.7 C) (Oral)   Resp 17   SpO2 98%  Physical Exam Vitals and nursing note reviewed.  Constitutional:      General: He is not in acute distress.    Appearance: He is well-developed. He is not ill-appearing, toxic-appearing or diaphoretic.  HENT:     Head: Normocephalic and atraumatic.     Nose: Nose normal.     Mouth/Throat:     Mouth: Mucous membranes are moist.  Eyes:     Extraocular Movements: Extraocular movements intact.     Conjunctiva/sclera: Conjunctivae normal.     Pupils: Pupils are equal, round, and reactive to light.  Neck:     Vascular: No carotid bruit.  Cardiovascular:     Rate and Rhythm: Normal rate and regular rhythm.     Heart sounds: No murmur heard. Pulmonary:     Effort: Pulmonary effort is normal. No respiratory distress.     Breath sounds: Normal breath sounds. No wheezing, rhonchi or rales.  Chest:     Chest wall: No tenderness.  Abdominal:     General: Abdomen is flat.     Palpations: Abdomen is soft.     Tenderness: There is no abdominal tenderness. There is no right CVA tenderness, left CVA tenderness, guarding or rebound.  Musculoskeletal:        General: No swelling or tenderness.     Cervical back: Neck supple. No tenderness.  Skin:    General: Skin is warm and dry.     Capillary Refill: Capillary refill takes less than 2 seconds.     Findings: No erythema or rash.  Neurological:     Mental Status: He is alert and oriented to person, place, and time.     Sensory: No sensory deficit.     Motor: No weakness.  Psychiatric:        Mood and Affect: Mood normal.     ED Results / Procedures / Treatments   Labs (all labs ordered are listed, but only abnormal results are displayed) Labs Reviewed  BASIC METABOLIC PANEL - Abnormal; Notable for the following components:      Result Value   Chloride 97 (*)    Glucose, Bld 105 (*)     BUN 95 (*)    Creatinine, Ser 18.17 (*)    GFR, Estimated 3 (*)    Anion gap 18 (*)    All other components within normal limits  CBC WITH DIFFERENTIAL/PLATELET - Abnormal; Notable for the following components:   RBC 3.19 (*)    Hemoglobin 11.0 (*)    HCT 32.9 (*)    MCV 103.1 (*)    MCH 34.5 (*)    Platelets 121 (*)    All other components within normal limits    EKG None  Radiology CT Head Wo Contrast  Result Date: 10/05/2021 CLINICAL DATA:  Provided history: Mental status change unknown cause. Additional history provided: Worsening  mental fog ongoing for 1 month. Intermittent aphasia, confusion occasional dragging of foot all walking. Recent procedure for aneurysm. EXAM: CT HEAD WITHOUT CONTRAST TECHNIQUE: Contiguous axial images were obtained from the base of the skull through the vertex without intravenous contrast. RADIATION DOSE REDUCTION: This exam was performed according to the departmental dose-optimization program which includes automated exposure control, adjustment of the mA and/or kV according to patient size and/or use of iterative reconstruction technique. COMPARISON:  Head CT 09/23/2021. Brain MRI 09/22/2021. FINDINGS: Brain: Interval increase in size of a mixed density subdural hematoma overlying the left cerebral hemisphere as compared to the prior head CT of 09/23/2021. The subdural hematoma now measures up to 20 mm in thickness. Increased mass effect upon the underlying left cerebral hemisphere with near complete effacement of the left lateral ventricle. 13 mm rightward midline shift measured at the level of the septum pellucidum, also increased. Partial effacement the basal cisterns on the right, also progressed. 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 Unchanged punctate focus of parenchymal  calcification within the mid to posterior left frontal lobe. No demarcated cortical infarct. No evidence of an intracranial mass. Vascular: Atherosclerotic calcifications. Embolization material along the course of the left middle meningeal artery. Skull: No fracture or aggressive osseous lesion. Sinuses/Orbits: No mass or acute finding within the imaged orbits. No significant paranasal sinus disease at the imaged levels. These results were called by telephone at the time of interpretation on 10/05/2021 at 2:09 pm to provider Dr. Gustavus Messing, who verbally acknowledged these results. IMPRESSION: A mixed density subdural hematoma overlying the left cerebral hemisphere has increased in size since the prior head CT of 09/23/2021, now measuring up to 20 mm in thickness (previously 13 mm). There is increased mass effect upon the underlying left cerebral hemisphere, now with 13 mm right midline shift (previously 6-7 mm). Partial effacement of the basal cisterns on the left, also progressed. Neurosurgical consultation is recommended. Background chronic small vessel ischemic disease, unchanged. Electronically Signed   By: Kellie Simmering D.O.   On: 10/05/2021 14:11    Procedures Procedures    CRITICAL CARE Performed by: Gwenyth Allegra Garnett Nunziata Total critical care time: 35 minutes Critical care time was exclusive of separately billable procedures and treating other patients. Critical care was necessary to treat or prevent imminent or life-threatening deterioration. Critical care was time spent personally by me on the following activities: development of treatment plan with patient and/or surrogate as well as nursing, discussions with consultants, evaluation of patient's response to treatment, examination of patient, obtaining history from patient or surrogate, ordering and performing treatments and interventions, ordering and review of laboratory studies, ordering and review of radiographic studies, pulse oximetry and  re-evaluation of patient's condition.   Medications Ordered in ED Medications - No data to display   ED Course/ Medical Decision Making/ A&P                           Medical Decision Making   JORY TANGUMA is a 61 y.o. 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  Patient called and spoke to the outpatient neurosurgery team who recommended coming in for evaluation.  On my exam, patient has intact sensation and strength in extremities and face.  Symmetric smile.  Clear speech.  He did not have aphasia during my exam however given reports he cannot have long conversations without getting tripped up on his words.  Pupils are symmetric and reactive with normal extraocular movements.  Normal finger-nose-finger testing.  Lungs clear and chest nontender.  Abdomen nontender.  Patient resting comfortably.  Did not ambulate patient initially.  Patient will had CT and labs ordered in triage given his recent neurosurgical procedure and recent subdurals with this headache.  2:23 PM Radiologist called report the patient's subdural has enlarged and is causing more mass effect.  Neurosurgery consultation recommended.  My exam patient not have any focal neurologic deficits but we will call neurosurgery to discuss a plan.  Spoke with Dr. Christella Noa who will call back to give recommendations on management.   3:32 PM Just spoke to Dr. Christella Noa who saw the patient and feels he needs to go the operating room tonight and they will admit for further management.         Final Clinical Impression(s) / ED Diagnoses Final diagnoses:   Nonintractable headache, unspecified chronicity pattern, unspecified headache type  SDH (subdural hematoma) ([name])    Clinical Impression: 1. Nonintractable headache, unspecified chronicity pattern, unspecified headache type   2. SDH (subdural hematoma) ([name])     Disposition: Admit  This note was prepared with assistance of Dragon voice recognition software. Occasional wrong-word or sound-a-like substitutions may have occurred due to the inherent limitations of voice recognition software.      Baleigh Rennaker, Gwenyth Allegra, MD 10/05/21 [phone]

## 2021-10-05 NOTE — Op Note (Signed)
10/05/2021  10:17 PM  PATIENT:  Daniel Solomon  61 y.o. male with an acute/subacute subdural hematoma on the left side causing mass effect. I recommended he undergo operative decompression.   PRE-OPERATIVE DIAGNOSIS:  Left subdural hematoma  POST-OPERATIVE DIAGNOSIS:  Left subdural hematoma  PROCEDURE:  Procedure(s): Left CRANIOTOMY HEMATOMA EVACUATION SUBDURAL  SURGEON: Surgeon(s): Ashok Pall, MD  ASSISTANTS:none  ANESTHESIA:   general  EBL:  Total I/O In: 1100 [I.V.:500; IV Piggyback:600] Out: 850 [Urine:550; Blood:300]  BLOOD ADMINISTERED:none  CELL SAVER GIVEN:none  COUNT:per nursing  DRAINS: none   SPECIMEN:  No Specimen  DICTATION: [name] was taken to the operating room, intubated, and placed under a general anesthetic without difficulty. He was positioned supine on the operating room table. Once adequate anesthesia was obtained I placed his head in a [place] three pin head holder. His head was secured to the bed with the [place] adapter. His head was shaved, was prepped and was draped in a sterile manner. I made a reverse question mark incision on the left side starting just in front of the tragus. I developed and reflected the scalp rostral and retracted it with fish hooks. I divided the temporalis muscle and retracted it rostrally with fish hooks.  I created burr holes in the skull surrounding my planned cerebral exposure. I connected the burr holes with the craniotome and elevated the skull flap exposing the dura. The dura did tear with the elevation but I flapped it laterally. I encountered chronic subdural blood and subacute blood. I irrigated copiously, removed some membranes and thickened clots. Once satisfied with the decompression and seeing a pulsatile brain I started my closure.  I placed a large piece of dural substitute on the brain surface obliterating the subdural space. I placed the skull flap into place with plates and screws. I approximated the  temporalis muscle. I approximated the scalp with galeal closure and staples on the surface. I placed a sterile dressing and he was transported to the ICU intubated.  PLAN OF CARE: Admit to inpatient   PATIENT DISPOSITION:  ICU - intubated and hemodynamically stable.   Delay start of Pharmacological VTE agent (>24hrs) due to surgical blood loss or risk of bleeding:  yes

## 2021-10-05 NOTE — Transfer of Care (Signed)
Immediate Anesthesia Transfer of Care Note  Patient: Daniel Solomon  Procedure(s) Performed: CRANIOTOMY HEMATOMA EVACUATION SUBDURAL (Left: Head)  Patient Location: ICU  Anesthesia Type:General  Level of Consciousness: Patient remains intubated per anesthesia plan  Airway & Oxygen Therapy: Patient remains intubated per anesthesia plan and Patient placed on Ventilator (see vital sign flow sheet for setting)  Post-op Assessment: Report given to RN and Post -op Vital signs reviewed and stable  Post vital signs: Reviewed and stable  Last Vitals:  Vitals Value Taken Time  BP 132/71 10/05/21 2154  Temp 36.3 C 10/05/21 2154  Pulse 84 10/05/21 2154  Resp 18 10/05/21 2154  SpO2 100 % 10/05/21 2154  Vitals shown include unvalidated device data.  Last Pain:  Vitals:   10/05/21 2154  TempSrc: Axillary  PainSc:          Complications: No notable events documented.

## 2021-10-05 NOTE — Anesthesia Procedure Notes (Signed)
Arterial Line Insertion Start/End8/09/2021 5:45 PM, 10/05/2021 6:04 PM Performed by: Albertha Ghee, MD, Minerva Ends, CRNA, CRNA  Patient location: Pre-op. Preanesthetic checklist: patient identified, IV checked, site marked, risks and benefits discussed, surgical consent, monitors and equipment checked, pre-op evaluation, timeout performed and anesthesia consent Lidocaine 1% used for infiltration Right, radial was placed Catheter size: 20 G Hand hygiene performed  and maximum sterile barriers used   Attempts: 2 Procedure performed using ultrasound guided technique. Following insertion, dressing applied and Biopatch. Post procedure assessment: normal and unchanged  Patient tolerated the procedure well with no immediate complications.

## 2021-10-05 NOTE — ED Provider Triage Note (Signed)
Emergency Medicine Provider Triage Evaluation Note  Daniel Solomon , a 61 y.o. male  was evaluated in triage.  Pt complains of worsening mental "fog" that has been ongoing for about 1 month.  Wife also mentions intermittent aphasia, confusion and occasional foot dragging while walking.  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  He had surgery done by Dr. Kathyrn Sheriff.  Wife and patient states that symptoms have not necessarily worsened since he was discharged a week ago but his symptoms have not improved at all since his surgery.  They did call the neurosurgeon who told him to come to the emergency department for evaluation.  Patient denies chest pain, shortness of breath  Review of Systems  Positive:  Negative:   Physical Exam  BP (!) 146/98 (BP Location: Left Arm)   Pulse 76   Temp 98.5 F (36.9 C) (Oral)   Resp 18   SpO2 97%  Gen:   Awake, no distress   Resp:  Normal effort  MSK:   Moves extremities without difficulty  Other:  Speech is clear, able to follow commands CN III-XII intact Normal strength in upper and lower extremities bilaterally including dorsiflexion and plantar flexion, strong and equal grip strength Sensation normal to light and sharp touch Moves extremities without ataxia, coordination intact Normal finger to nose and rapid alternating movements No pronator drift   Medical Decision Making  Medically screening exam initiated at 12:45 PM.  Appropriate orders placed.  [name] was informed that the remainder of the evaluation will be completed by another provider, this initial triage assessment does not replace that evaluation, and the importance of remaining in the ED until their evaluation is complete.     [name], [place] 10/05/21 1247

## 2021-10-06 ENCOUNTER — Inpatient Hospital Stay (HOSPITAL_COMMUNITY): Payer: Managed Care, Other (non HMO)

## 2021-10-06 DIAGNOSIS — Z9889 Other specified postprocedural states: Secondary | ICD-10-CM | POA: Diagnosis not present

## 2021-10-06 LAB — TRIGLYCERIDES: Triglycerides: 213 mg/dL — ABNORMAL HIGH (ref <150)

## 2021-10-06 LAB — IRON AND TIBC
Iron: 79 ug/dL (ref 45–182)
Saturation Ratios: 53 % — ABNORMAL HIGH (ref 17.9–39.5)
TIBC: 150 ug/dL — ABNORMAL LOW (ref 250–450)
UIBC: 71 ug/dL

## 2021-10-06 LAB — PHOSPHORUS: Phosphorus: 9.2 mg/dL — ABNORMAL HIGH (ref 2.5–4.6)

## 2021-10-06 LAB — ALBUMIN: Albumin: 3.2 g/dL — ABNORMAL LOW (ref 3.5–5.0)

## 2021-10-06 MED ORDER — LEVETIRACETAM 500 MG PO TABS
500.0000 mg | ORAL_TABLET | Freq: Two times a day (BID) | ORAL | Status: DC
  Administered 2021-10-06 – 2021-10-07 (×2): 500 mg via ORAL
  Filled 2021-10-06 (×2): qty 1

## 2021-10-06 MED ORDER — PANTOPRAZOLE 2 MG/ML SUSPENSION: 40.0000 mg | Freq: Every day | ORAL | Status: DC

## 2021-10-06 MED ORDER — LABETALOL HCL 100 MG PO TABS
100.0000 mg | ORAL_TABLET | Freq: Two times a day (BID) | ORAL | Status: DC
Start: 2021-10-06 — End: 2021-10-08
  Administered 2021-10-06 – 2021-10-07 (×2): 100 mg via ORAL
  Filled 2021-10-06 (×2): qty 1

## 2021-10-06 MED ORDER — CALCITRIOL 1 MCG/ML PO SOLN
0.2500 ug | Freq: Every day | ORAL | Status: DC
  Administered 2021-10-07: 0.25 ug via ORAL
  Filled 2021-10-06 (×2): qty 0.25

## 2021-10-06 MED ORDER — GENTAMICIN SULFATE 0.1 % EX CREA
1.0000 | TOPICAL_CREAM | Freq: Every day | CUTANEOUS | Status: DC
  Administered 2021-10-06 – 2021-10-14 (×10): 1 via TOPICAL
  Filled 2021-10-06: qty 15

## 2021-10-06 MED ORDER — ALLOPURINOL 300 MG PO TABS
300.0000 mg | ORAL_TABLET | Freq: Every day | ORAL | Status: DC
  Filled 2021-10-06: qty 1

## 2021-10-06 MED ORDER — HYDROCODONE-ACETAMINOPHEN 5-325 MG PO TABS
1.0000 | ORAL_TABLET | ORAL | Status: DC | PRN
  Administered 2021-10-06 – 2021-10-07 (×2): 1 via ORAL
  Filled 2021-10-06 (×2): qty 1

## 2021-10-06 MED ORDER — ATORVASTATIN CALCIUM 40 MG PO TABS
40.0000 mg | ORAL_TABLET | Freq: Every day | ORAL | Status: DC
  Administered 2021-10-07: 40 mg via ORAL
  Filled 2021-10-06: qty 1

## 2021-10-06 MED ORDER — SENNA 8.6 MG PO TABS
1.0000 | ORAL_TABLET | Freq: Two times a day (BID) | ORAL | Status: DC
  Administered 2021-10-06 – 2021-10-07 (×2): 8.6 mg via ORAL
  Filled 2021-10-06 (×2): qty 1

## 2021-10-06 MED ORDER — CALCITRIOL 0.25 MCG PO CAPS: 0.2500 ug | ORAL_CAPSULE | Freq: Every day | ORAL | Status: DC

## 2021-10-06 MED ORDER — TORSEMIDE 100 MG PO TABS
100.0000 mg | ORAL_TABLET | Freq: Every day | ORAL | Status: DC
  Administered 2021-10-07: 100 mg via ORAL
  Filled 2021-10-06 (×2): qty 1

## 2021-10-06 MED ORDER — DELFLEX-LC/1.5% DEXTROSE 344 MOSM/L IP SOLN: INTRAPERITONEAL | Status: DC

## 2021-10-06 NOTE — Consult Note (Signed)
Renal Service Consult Note [facility]  Daniel ENCINAS 10/06/2021 Sol Blazing, MD Requesting Physician: Dr. Christella Noa  Reason for Consult: ESRD pt w/ AMS and SDH HPI: The patient is a 61 y.o. year-old w/ hx of anemia, CAD, esrd on PD, gout, HL, HTN, PAF, sp TAVR, hx SDH (July 2023) who presented to ED 8/8 w/ AMS including ataxia and aphasia, w/ worsening mental clarity for the last month. Pt was here July 26-27 for subacute SDH on left side and was readmitted 8/1- 8/2 for elective L MMA embolization done by Dr Kathyrn Sheriff. In ED CT showed enlarging SDH w/ ^'d mass effect. NSGY was consulted and took pt to OR for craniotomy and evacuation of SDH which was done last night overnight. We are asked to see for ESRD/ dialysis.   Pt seen in room, he is sedated on the vent. Wife at bedside. Pt is getting IV cleviprex +/- neo gtt w/ goal SBP >90 and < 140. Per wife he started PD 1.5-2 yrs ago. No issues w/ infection or catheter malfunction. Pt not alert and cannot give any hx.   ROS - n/a  Past Medical History  Past Medical History:  Diagnosis Date   Anemia    Bicuspid aortic valve    Blood transfusion without reported diagnosis    CAD (coronary artery disease)    proximal LAD 50%, mid LAD 50-60%, mid to distal LAD 99%; mid circumflex 20%; proximal RCA 20%, distal RCA 50%; PDA 60-70%, proximal posterior AV groove 50%, proximal posterior lateral 50%.  There was a question of possible LVOT gradient;  s/p Resolute DES to mid-distal LAD 10/2010;  echocardiogram 11/17/10: EF 55-60%, mild LVH, grade 1 diastolic dysfunction, normal aortic valve, mild MR, PASP 32.    Chicken pox    Enlarged aorta ([name])    ESRD on peritoneal dialysis ([name])    Glaucoma    Gout    allopurinol '300mg'$ , colchicine prn in past, has not had flares   Headache    History of kidney stones    Hypercholesterolemia    With hypertriglyceridemia   Hypertension    Mild aortic stenosis    PAF (paroxysmal atrial  fibrillation) ([place])    a. Documented on event monitor 01/2016.   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   SDH (subdural hematoma) ([place]) 08/2021   left   Sleep apnea    cpap nightly   Past Surgical History  Past Surgical History:  Procedure Laterality Date   CORONARY STENT PLACEMENT  2012   CYSTOSCOPY/RETROGRADE/URETEROSCOPY  03/26/2012   Procedure: CYSTOSCOPY/RETROGRADE/URETEROSCOPY;  Surgeon: [name], MD;  Location: [facility]  Service: Urology;  Laterality: Bilateral;  CYSTOSCOPY BILATERAL RETROGRADE PYELOGRAM AND POSSIBLE URETEROSCOPY   INTRAOPERATIVE TRANSTHORACIC ECHOCARDIOGRAM N/A 01/19/2021   Procedure: INTRAOPERATIVE TRANSTHORACIC ECHOCARDIOGRAM;  Surgeon: Burnell Blanks, MD;  Location: [place];  Service: Open Heart Surgery;  Laterality: N/A;   IR ANGIO EXTERNAL CAROTID SEL EXT CAROTID UNI L MOD SED  09/28/2021   IR ANGIO INTRA EXTRACRAN SEL INTERNAL CAROTID UNI L MOD SED  09/28/2021   IR ANGIOGRAM FOLLOW UP STUDY  09/28/2021   IR ANGIOGRAM FOLLOW UP STUDY  09/30/2021   IR NEURO EACH ADD'L AFTER BASIC UNI LEFT (MS)  09/28/2021   IR NEURO EACH ADD'L AFTER BASIC UNI RIGHT (MS)  09/30/2021  IR TRANSCATH/EMBOLIZ  09/28/2021   RADIOLOGY WITH ANESTHESIA N/A 09/28/2021   Procedure: Middle meningeal artery embolization;  Surgeon: Consuella Lose, MD;  Location: [place];  Service: Radiology;  Laterality: N/A;   RIGHT HEART CATH AND CORONARY ANGIOGRAPHY N/A 12/04/2020   Procedure: RIGHT HEART CATH AND CORONARY ANGIOGRAPHY;  Surgeon: Burnell Blanks, MD;  Location: [place] [facility];  Service: Cardiovascular;  Laterality: N/A;   TRANSCATHETER AORTIC VALVE REPLACEMENT, TRANSFEMORAL N/A 01/19/2021   Procedure: TRANSCATHETER AORTIC VALVE REPLACEMENT, TRANSFEMORAL USING A 26MM EDWARDS VALVE.;  Surgeon: Burnell Blanks, MD;   Location: [place];  Service: Open Heart Surgery;  Laterality: N/A;   Family History  Family History  Problem Relation Age of Onset   Hypertension Father    CAD Father        CABG 38s   Kidney disease Father        Stage IV   Valvular heart disease Mother        AVR July 2015   Diabetes Paternal Grandmother    Colon cancer Neg Hx    Social History  reports that he has never smoked. He has never used smokeless tobacco. He reports that he does not drink alcohol and does not use drugs. Allergies No Known Allergies Home medications Prior to Admission medications   Medication Sig Start Date End Date Taking? Authorizing Provider  allopurinol (ZYLOPRIM) 300 MG tablet TAKE 1 TABLET BY MOUTH EVERY DAY Patient taking differently: Take 300 mg by mouth daily. 04/19/21  Yes Marin Olp, MD  atorvastatin (LIPITOR) 40 MG tablet TAKE 1 TABLET BY MOUTH EVERY DAY Patient taking differently: Take 40 mg by mouth daily. 02/16/21  Yes Marin Olp, MD  AURYXIA 1 GM 210 MG(Fe) tablet Take 630 mg by mouth 3 (three) times daily with meals. 12/23/20  Yes [provider]  calcitRIOL (ROCALTROL) 0.25 MCG capsule Take 0.25 mcg by mouth daily. 01/29/21  Yes [provider]  docusate sodium (COLACE) 100 MG capsule Take 100 mg by mouth daily.   Yes [provider]  gentamicin ointment (GARAMYCIN) 0.1 % Apply 1 application topically daily.   Yes [provider]  iron sucrose in sodium chloride 0.9 % 100 mL Iron Sucrose (Venofer) 11/23/20  Yes [provider]  labetalol (NORMODYNE) 100 MG tablet Take 100 mg by mouth 2 (two) times daily. 04/30/20  Yes [provider]  Methoxy PEG-Epoetin Beta (MIRCERA IJ) Inject into the skin as needed. 06/15/20  Yes [provider]  torsemide (DEMADEX) 100 MG tablet Take 1 tablet (100 mg total) by mouth daily. 08/25/21  Yes Burnell Blanks, MD  HYDROcodone-acetaminophen (NORCO/VICODIN) 5-325 MG tablet Take 1  tablet by mouth every 6 (six) hours as needed for moderate pain or severe pain. 1-2 tabs po tid prn Patient not taking: Reported on 10/05/2021 09/23/21   Lavina Hamman, MD  levETIRAcetam (KEPPRA) 500 MG tablet Take 1 tablet (500 mg total) by mouth 2 (two) times daily for 7 days. 09/23/21 09/30/21  Lavina Hamman, MD  methocarbamol (ROBAXIN) 500 MG tablet Take 2 tablets (1,000 mg total) by mouth 4 (four) times daily as needed (for Headache). START with 1 pill, if not too drowsy take 2 pills. Patient not taking: Reported on 09/24/2021 09/09/21   Jeanie Sewer, NP     Vitals:   10/06/21 0600 10/06/21 0700 10/06/21 0758 10/06/21 0800  BP: 113/66 116/77 126/79 126/79  Pulse: 83 90 88 85  Resp: '16 16 14 '$ 12  Temp:      TempSrc:      SpO2: 100% 100% 100% 100%  Weight:      Height:       Exam Gen on vent, sedated No rash, cyanosis or gangrene Sclera anicteric, throat w/ ETT No jvd or bruits Chest clear anterior/ lateral RRR no MRG Abd soft ntnd no mass or ascites +bs GU normal male MS no joint effusions or deformity Ext no LE or UE edema, no wounds or ulcers Neuro is on vent, sedated  RLQ PD cath intact, clear exit site   Home meds include - allopurinol, atorvastatin, auryxia 3 ac tid, calcitriol 0.25 qd, docusate, hydrocodone-aceta prn, labetalol 100 bid, levetiracetam, torsemide 100 qd, prns/ vits / supps     OP PD: CCPD [facility] 7d /wk 93kg  5 overnight, 3000 fill, last fill 2500 for daybag - last Hb 11.6 on 7/7, tsat 14%, ferr 754 - no esa or IV fe currently   Assessment/ Plan: SDH w/ brain compression - sp SDH evac and craniotomy 8/8 Resp insuff - requiring MV, per CCM PAF - holding eliquis for now, per cards/ NSGY ESRD - on CCPD. Has missed some PD sessions w/ recurrent recent hospital admissions, and creat is high at 18. Will try to do extra daytime exchanges today if staffing allows, then plan for regular CCPD overnight tonight.  CAD hx PCI / sp TAVR HTN - as above Volume  - euvolemic on exam, but is well under dry wt. Getting NS at 80 cc/hr, would continue. Will use lowest UF fluids w/ PD.  Anemia esrd - not on esa, Hb 11 > 8.7. Transfuse prn.  MBD ckd - Ca in range, will add on albumin/ phos. Cont daily vdra,  and binder when eating.        Kelly Splinter  MD 10/06/2021, 8:04 AM Recent Labs  Lab 10/05/21 1250 10/05/21 2255 10/05/21 2304  HGB 11.0* 9.5* 8.7*  CALCIUM 9.7  --   --   CREATININE 18.17*  --   --   K 4.3 5.0  --

## 2021-10-06 NOTE — Progress Notes (Signed)
PT on RA with clear BS. CPAP in room however recent craniotomy staples/swelling present. CPAP straps around head contraindicated at this time. Will allow more time for recovery from surgery before placing tight straps on surgical site. Will continue to monitor pt for OSA/distress.

## 2021-10-06 NOTE — Progress Notes (Signed)
   NAME:  Daniel Solomon, MRN:  [id number], DOB:  03/04/1960, LOS: 1 ADMISSION DATE:  10/05/2021, CONSULTATION DATE:  8/8 REFERRING MD:  Christella Noa, CHIEF COMPLAINT:  post procedure ventilator management   History of Present Illness:  61 y/o male with multiple medical problems underwent an emergent craniotomy and hematoma evacuation on 8/8 in the setting of new onset aphasia and weakness due to an enlarging subdural hematoma.  He underwent an elective middle meningeal artery embolism on 8/1.    Pertinent  Medical History  ESRD CAD Hyperlipidemia Aortic stenosis Paroxysmal atrial fibrillation SDH Hypertension OSA Gout   Significant Hospital Events: Including procedures, antibiotic start and stop dates in addition to other pertinent events   8/8 admission, underwent craniotomy with hematoma evacuation, sent to ICU on vent  Interim History / Subjective:   Passing SBT this morning Following commands  Objective   Blood pressure 116/77, pulse 90, temperature (!) 97.5 F (36.4 C), temperature source Axillary, resp. rate 16, height '5\' 8"'$  (1.727 m), weight 87.8 kg, SpO2 100 %.    Vent Mode: PRVC FiO2 (%):  [40 %-50 %] 40 % Set Rate:  [16 bmp-18 bmp] 16 bmp Vt Set:  [540 mL] 540 mL PEEP:  [5 cmH20] 5 cmH20 Plateau Pressure:  [14 cmH20-15 cmH20] 14 cmH20   Intake/Output Summary (Last 24 hours) at 10/06/2021 0751 Last data filed at 10/06/2021 0600 Gross per 24 hour  Intake 1964.33 ml  Output 900 ml  Net 1064.33 ml   Filed Weights   10/05/21 1716 10/05/21 2154  Weight: 88 kg 87.8 kg    Examination:  General:  In bed on vent HENT: NCAT ETT in place PULM: CTA B, vent supported breathing CV: RRR, no mgr GI: BS+, soft, nontender MSK: normal bulk and tone Neuro: sedated on vent  Resolved Hospital Problem list     Assessment & Plan:  Left subacute subdural hematoma with brain compression S/p SDH evacuation and craniotomy by Dr. Barrie Folk op care per neurosurgery  Goal SBP <  140 Cleviprex for SBP goal Keppra to continue  Need for mechanical ventilation after neurosurgery > resolved OSA Extubate Nightly CPAP Aspiration precautions  Paroxysmal atrial fibrillation, previously on Eliquis Tele Restart labetalol when taking PO  CAD Aortic stenosis, s/p TAVR Tele Labetalol to restart when taking po Torsemide to continue  Hypertension Cleviprex for SBP goal  As above  Hyperlipidemia Statin when taking PO  ESRD on Peritoneal dialysis PD today per renal Monitor BMET and UOP Replace electrolytes as needed  Gout Restart allopurinol when taking PO  Best Practice (right click and "Reselect all SmartList Selections" daily)   Diet/type: NPO DVT prophylaxis: SCD GI prophylaxis: N/A Lines: N/A Foley:  Yes, and it is no longer needed > remove Code Status:  full code Last date of multidisciplinary goals of care discussion [per NSGY, wife updated bedside by [facility] on 8/9]  Critical care time: 31 minutes      Roselie Awkward, MD [facility] Pager: [phone] Cell: [phone] After 7:00 pm call Elink  [phone]

## 2021-10-06 NOTE — Progress Notes (Addendum)
Patient ID: Daniel Solomon, male   DOB: Sep 26, 1960, 61 y.o.   MRN: [id number] BP (!) 143/90   Pulse 71   Temp 97.6 F (36.4 C) (Oral)   Resp 14   Ht '5\' 8"'$  (1.727 m)   Wt 87.8 kg   SpO2 100%   BMI 29.43 kg/m  Alert, paucity of speech. Speech is clear Moving all extremities well Wound is clean, no signs of infection Acutally believe blood is in the epiduraL space. I believe the scan will improve with time.  And he shall also

## 2021-10-06 NOTE — Progress Notes (Signed)
PD tx initation note:   Pre TX VS: BP 132/37mHg, HR 71bpm, Sats 100% on RA, T 97.81F, RR 13brpm  Pre TX weight: UTA ( Bed weight not working)  PD treatment initiated via aseptic technique. Consent signed and in chart. Patient is alert and oriented. No complaints of pain. No specimen collected. PD exit site clean, dry and intact. Gentamycin and new dressing applied. Bedside RN educated on PD machine and how to contact tech support when PD machine alarms.

## 2021-10-06 NOTE — Progress Notes (Signed)
Subjective: Patient reports doing well, no headaches, sitting up in bed eager to get up  Objective: Vital signs in last 24 hours: Temp:  [96.6 F (35.9 C)-98.5 F (36.9 C)] 96.6 F (35.9 C) (08/09 0800) Pulse Rate:  [70-91] 85 (08/09 0800) Resp:  [11-20] 12 (08/09 0800) BP: (108-170)/(60-113) 126/79 (08/09 0800) SpO2:  [97 %-100 %] 100 % (08/09 0902) Arterial Line BP: (123-150)/(59-74) 141/65 (08/09 0800) FiO2 (%):  [40 %-50 %] 40 % (08/09 0800) Weight:  [87.8 kg-88 kg] 87.8 kg (08/08 2154)  Intake/Output from previous day: 08/08 0701 - 08/09 0700 In: 1964.3 [I.V.:1214.3; IV Piggyback:750] Out: 900 [Urine:600; Blood:300] Intake/Output this shift: Total I/O In: 215.6 [I.V.:215.6] Out: -   Neurologic: Grossly normal  Lab Results: Lab Results  Component Value Date   WBC 6.1 10/05/2021   HGB 8.7 (L) 10/05/2021   HCT 26.4 (L) 10/05/2021   MCV 102.7 (H) 10/05/2021   PLT 102 (L) 10/05/2021   Lab Results  Component Value Date   INR 1.3 (H) 10/05/2021   BMET Lab Results  Component Value Date   NA 134 (L) 10/05/2021   K 5.0 10/05/2021   CL 97 (L) 10/05/2021   CO2 24 10/05/2021   GLUCOSE 105 (H) 10/05/2021   BUN 95 (H) 10/05/2021   CREATININE 18.17 (H) 10/05/2021   CALCIUM 9.7 10/05/2021    Studies/Results: DG CHEST PORT 1 VIEW  Result Date: 10/05/2021 CLINICAL DATA:  Respiratory failure EXAM: PORTABLE CHEST 1 VIEW COMPARISON:  01/15/2021 FINDINGS: Endotracheal tube is seen 6.6 cm above the carina. Nasogastric tube tip is within the expected gastric fundus. Lungs are clear. No pneumothorax or pleural effusion. 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 Cardiac size is within normal limits. Pulmonary vascularity is normal. No acute bone abnormality. IMPRESSION: 1. Support tubes in appropriate position. 2. No active disease. Electronically Signed   By: Fidela Salisbury M.D.   On: 10/05/2021 23:52   CT Head Wo Contrast  Result Date: 10/05/2021 CLINICAL DATA:   Provided history: Mental status change unknown cause. Additional history provided: Worsening mental fog ongoing for 1 month. Intermittent aphasia, confusion occasional dragging of foot all walking. Recent procedure for aneurysm. EXAM: CT HEAD WITHOUT CONTRAST TECHNIQUE: Contiguous axial images were obtained from the base of the skull through the vertex without intravenous contrast. RADIATION DOSE REDUCTION: This exam was performed according to the departmental dose-optimization program which includes automated exposure control, adjustment of the mA and/or kV according to patient size and/or use of iterative reconstruction technique. COMPARISON:  Head CT 09/23/2021. Brain MRI 09/22/2021. FINDINGS: Brain: Interval increase in size of a mixed density subdural hematoma overlying the left cerebral hemisphere as compared to the prior head CT of 09/23/2021. The subdural hematoma now measures up to 20 mm in thickness. Increased mass effect upon the underlying left cerebral hemisphere with near complete effacement of the left lateral ventricle. 13 mm rightward midline shift measured at the level of the septum pellucidum, also increased. Partial effacement the basal cisterns on the right, also progressed. 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 Unchanged punctate focus of parenchymal calcification within the mid to posterior left frontal lobe. No demarcated cortical infarct. No evidence of an intracranial mass. Vascular: Atherosclerotic calcifications. Embolization material along the course of the left middle meningeal artery. Skull: No fracture or aggressive osseous lesion. Sinuses/Orbits: No mass or acute  finding within the imaged orbits. No significant paranasal sinus disease at the imaged levels. These results were called by telephone at the time of  interpretation on 10/05/2021 at 2:09 pm to provider Dr. Gustavus Messing, who verbally acknowledged these results. IMPRESSION: A mixed density subdural hematoma overlying the left cerebral hemisphere has increased in size since the prior head CT of 09/23/2021, now measuring up to 20 mm in thickness (previously 13 mm). There is increased mass effect upon the underlying left cerebral hemisphere, now with 13 mm right midline shift (previously 6-7 mm). Partial effacement of the basal cisterns on the left, also progressed. Neurosurgical consultation is recommended. Background chronic small vessel ischemic disease, unchanged. Electronically Signed   By: Kellie Simmering D.O.   On: 10/05/2021 14:11    Assessment/Plan: Postop day 1 crani for SDH done by Dr. Christella Noa last night in Dr. Ronnald Ramp' absence. He is doing much better per his wife who is at bedside. Will d/c a line today, get OOB and advance diet as tolerated.    LOS: 1 day    Ocie Cornfield Icel Castles 10/06/2021, 10:36 AM

## 2021-10-07 ENCOUNTER — Encounter (HOSPITAL_COMMUNITY)
Admission: EM | Disposition: A | Discharge status: Home Health | Payer: Self-pay | Source: Home / Self Care | Attending: Neurosurgery

## 2021-10-07 ENCOUNTER — Other Ambulatory Visit: Payer: Self-pay

## 2021-10-07 ENCOUNTER — Inpatient Hospital Stay (HOSPITAL_COMMUNITY): Payer: Managed Care, Other (non HMO) | Admitting: Certified Registered Nurse Anesthetist

## 2021-10-07 ENCOUNTER — Encounter (HOSPITAL_COMMUNITY): Payer: Self-pay | Admitting: Neurosurgery

## 2021-10-07 ENCOUNTER — Inpatient Hospital Stay (HOSPITAL_COMMUNITY): Payer: Managed Care, Other (non HMO)

## 2021-10-07 DIAGNOSIS — I2510 Atherosclerotic heart disease of native coronary artery without angina pectoris: Secondary | ICD-10-CM | POA: Diagnosis not present

## 2021-10-07 DIAGNOSIS — S061XAA Traumatic cerebral edema with loss of consciousness status unknown, initial encounter: Secondary | ICD-10-CM | POA: Diagnosis not present

## 2021-10-07 DIAGNOSIS — Z992 Dependence on renal dialysis: Secondary | ICD-10-CM

## 2021-10-07 DIAGNOSIS — I120 Hypertensive chronic kidney disease with stage 5 chronic kidney disease or end stage renal disease: Secondary | ICD-10-CM | POA: Diagnosis not present

## 2021-10-07 DIAGNOSIS — N186 End stage renal disease: Secondary | ICD-10-CM

## 2021-10-07 DIAGNOSIS — S065XAA Traumatic subdural hemorrhage with loss of consciousness status unknown, initial encounter: Secondary | ICD-10-CM | POA: Diagnosis not present

## 2021-10-07 DIAGNOSIS — Z9889 Other specified postprocedural states: Secondary | ICD-10-CM | POA: Diagnosis not present

## 2021-10-07 HISTORY — PX: CRANIOTOMY: SHX93

## 2021-10-07 LAB — BASIC METABOLIC PANEL
Anion gap: 16 — ABNORMAL HIGH (ref 5–15)
BUN: 97 mg/dL — ABNORMAL HIGH (ref 6–20)
CO2: 21 mmol/L — ABNORMAL LOW (ref 22–32)
Calcium: 9.5 mg/dL (ref 8.9–10.3)
Chloride: 100 mmol/L (ref 98–111)
Creatinine, Ser: 17.36 mg/dL — ABNORMAL HIGH (ref 0.61–1.24)
GFR, Estimated: 3 mL/min — ABNORMAL LOW (ref >60)
Glucose, Bld: 106 mg/dL — ABNORMAL HIGH (ref 70–99)
Potassium: 4.7 mmol/L (ref 3.5–5.1)
Sodium: 137 mmol/L (ref 135–145)

## 2021-10-07 LAB — SURGICAL PCR SCREEN
MRSA, PCR: NEGATIVE
Staphylococcus aureus: NEGATIVE

## 2021-10-07 LAB — POCT I-STAT 7, (LYTES, BLD GAS, ICA,H+H)
Acid-base deficit: 5 mmol/L — ABNORMAL HIGH (ref 0.0–2.0)
Acid-base deficit: 7 mmol/L — ABNORMAL HIGH (ref 0.0–2.0)
Bicarbonate: 20.6 mmol/L (ref 20.0–28.0)
Bicarbonate: 18.9 mmol/L — ABNORMAL LOW (ref 20.0–28.0)
Calcium, Ion: 1.16 mmol/L (ref 1.15–1.40)
Calcium, Ion: 1.1 mmol/L — ABNORMAL LOW (ref 1.15–1.40)
HCT: 21 % — ABNORMAL LOW (ref 39.0–52.0)
HCT: 24 % — ABNORMAL LOW (ref 39.0–52.0)
Hemoglobin: 7.1 g/dL — ABNORMAL LOW (ref 13.0–17.0)
Hemoglobin: 8.2 g/dL — ABNORMAL LOW (ref 13.0–17.0)
O2 Saturation: 97 %
O2 Saturation: 98 %
Patient temperature: 35.7
Potassium: 5.1 mmol/L (ref 3.5–5.1)
Potassium: 5.4 mmol/L — ABNORMAL HIGH (ref 3.5–5.1)
Sodium: 136 mmol/L (ref 135–145)
Sodium: 134 mmol/L — ABNORMAL LOW (ref 135–145)
TCO2: 22 mmol/L (ref 22–32)
TCO2: 20 mmol/L — ABNORMAL LOW (ref 22–32)
pCO2 arterial: 39 mmHg (ref 32–48)
pCO2 arterial: 40.5 mmHg (ref 32–48)
pH, Arterial: 7.323 — ABNORMAL LOW (ref 7.35–7.45)
pH, Arterial: 7.277 — ABNORMAL LOW (ref 7.35–7.45)
pO2, Arterial: 91 mmHg (ref 83–108)
pO2, Arterial: 110 mmHg — ABNORMAL HIGH (ref 83–108)

## 2021-10-07 LAB — CBC
HCT: 24.2 % — ABNORMAL LOW (ref 39.0–52.0)
Hemoglobin: 8.1 g/dL — ABNORMAL LOW (ref 13.0–17.0)
MCH: 34.3 pg — ABNORMAL HIGH (ref 26.0–34.0)
MCHC: 33.5 g/dL (ref 30.0–36.0)
MCV: 102.5 fL — ABNORMAL HIGH (ref 80.0–100.0)
Platelets: 103 10*3/uL — ABNORMAL LOW (ref 150–400)
RBC: 2.36 MIL/uL — ABNORMAL LOW (ref 4.22–5.81)
RDW: 13.4 % (ref 11.5–15.5)
WBC: 9.5 10*3/uL (ref 4.0–10.5)
nRBC: 0 % (ref 0.0–0.2)

## 2021-10-07 LAB — COMPREHENSIVE METABOLIC PANEL
ALT: 10 U/L (ref 0–44)
AST: 16 U/L (ref 15–41)
Albumin: 2.8 g/dL — ABNORMAL LOW (ref 3.5–5.0)
Alkaline Phosphatase: 50 U/L (ref 38–126)
Anion gap: 18 — ABNORMAL HIGH (ref 5–15)
BUN: 102 mg/dL — ABNORMAL HIGH (ref 6–20)
CO2: 18 mmol/L — ABNORMAL LOW (ref 22–32)
Calcium: 9.3 mg/dL (ref 8.9–10.3)
Chloride: 101 mmol/L (ref 98–111)
Creatinine, Ser: 18.03 mg/dL — ABNORMAL HIGH (ref 0.61–1.24)
GFR, Estimated: 3 mL/min — ABNORMAL LOW (ref >60)
Glucose, Bld: 109 mg/dL — ABNORMAL HIGH (ref 70–99)
Potassium: 4.6 mmol/L (ref 3.5–5.1)
Sodium: 137 mmol/L (ref 135–145)
Total Bilirubin: 0.5 mg/dL (ref 0.3–1.2)
Total Protein: 5.1 g/dL — ABNORMAL LOW (ref 6.5–8.1)

## 2021-10-07 LAB — PREPARE RBC (CROSSMATCH)

## 2021-10-07 SURGERY — CRANIOTOMY HEMATOMA EVACUATION SUBDURAL
Anesthesia: General | Site: Head | Laterality: Left

## 2021-10-07 MED ORDER — SODIUM CHLORIDE 0.9 % IV SOLN: INTRAVENOUS | Status: DC | PRN

## 2021-10-07 MED ORDER — PROPOFOL 10 MG/ML IV BOLUS
INTRAVENOUS | Status: DC | PRN
  Administered 2021-10-07: 100 mg via INTRAVENOUS

## 2021-10-07 MED ORDER — OXYCODONE HCL 5 MG PO TABS: 5.0000 mg | ORAL_TABLET | Freq: Once | ORAL | Status: DC | PRN

## 2021-10-07 MED ORDER — CHLORHEXIDINE GLUCONATE 0.12 % MT SOLN
OROMUCOSAL | Status: AC
  Administered 2021-10-07: 15 mL via OROMUCOSAL
  Filled 2021-10-07: qty 15

## 2021-10-07 MED ORDER — PHENYLEPHRINE HCL (PRESSORS) 10 MG/ML IV SOLN
INTRAVENOUS | Status: DC | PRN
  Administered 2021-10-07: 80 ug via INTRAVENOUS

## 2021-10-07 MED ORDER — ROCURONIUM BROMIDE 100 MG/10ML IV SOLN
INTRAVENOUS | Status: DC | PRN
  Administered 2021-10-07: 50 mg via INTRAVENOUS
  Administered 2021-10-07: 80 mg via INTRAVENOUS

## 2021-10-07 MED ORDER — ORAL CARE MOUTH RINSE: 15.0000 mL | Freq: Once | OROMUCOSAL | Status: AC

## 2021-10-07 MED ORDER — BACITRACIN ZINC 500 UNIT/GM EX OINT
TOPICAL_OINTMENT | CUTANEOUS | Status: DC | PRN
  Administered 2021-10-07: 1 via TOPICAL

## 2021-10-07 MED ORDER — 0.9 % SODIUM CHLORIDE (POUR BTL) OPTIME
TOPICAL | Status: DC | PRN
  Administered 2021-10-07 (×3): 1000 mL

## 2021-10-07 MED ORDER — PROPOFOL 10 MG/ML IV BOLUS
INTRAVENOUS | Status: AC
  Filled 2021-10-07: qty 20

## 2021-10-07 MED ORDER — MANNITOL 25 % IV SOLN
INTRAVENOUS | Status: DC | PRN
  Administered 2021-10-07: 25 g via INTRAVENOUS

## 2021-10-07 MED ORDER — ALBUMIN HUMAN 5 % IV SOLN: INTRAVENOUS | Status: DC | PRN

## 2021-10-07 MED ORDER — SODIUM BICARBONATE 8.4 % IV SOLN
INTRAVENOUS | Status: AC
  Filled 2021-10-07: qty 50

## 2021-10-07 MED ORDER — THROMBIN 20000 UNITS EX SOLR
CUTANEOUS | Status: AC
  Filled 2021-10-07: qty 20000

## 2021-10-07 MED ORDER — CEFAZOLIN SODIUM 1 G IJ SOLR
INTRAMUSCULAR | Status: AC
  Filled 2021-10-07: qty 20

## 2021-10-07 MED ORDER — SUCCINYLCHOLINE CHLORIDE 200 MG/10ML IV SOSY
PREFILLED_SYRINGE | INTRAVENOUS | Status: DC | PRN
  Administered 2021-10-07: 120 mg via INTRAVENOUS

## 2021-10-07 MED ORDER — CEFAZOLIN SODIUM-DEXTROSE 2-3 GM-%(50ML) IV SOLR
INTRAVENOUS | Status: DC | PRN
  Administered 2021-10-07: 2 g via INTRAVENOUS

## 2021-10-07 MED ORDER — LIDOCAINE-EPINEPHRINE 0.5 %-1:200000 IJ SOLN
INTRAMUSCULAR | Status: AC
  Filled 2021-10-07: qty 1

## 2021-10-07 MED ORDER — MUPIROCIN 2 % EX OINT
1.0000 | TOPICAL_OINTMENT | Freq: Two times a day (BID) | CUTANEOUS | Status: DC
  Administered 2021-10-07: 1 via NASAL
  Filled 2021-10-07: qty 22

## 2021-10-07 MED ORDER — FENTANYL CITRATE (PF) 100 MCG/2ML IJ SOLN: 25.0000 ug | INTRAMUSCULAR | Status: DC | PRN

## 2021-10-07 MED ORDER — THROMBIN 5000 UNITS EX SOLR
CUTANEOUS | Status: AC
  Filled 2021-10-07: qty 5000

## 2021-10-07 MED ORDER — LIDOCAINE HCL (CARDIAC) PF 100 MG/5ML IV SOSY
PREFILLED_SYRINGE | INTRAVENOUS | Status: DC | PRN
  Administered 2021-10-07: 40 mg via INTRAVENOUS

## 2021-10-07 MED ORDER — ONDANSETRON HCL 4 MG/2ML IJ SOLN: 4.0000 mg | Freq: Four times a day (QID) | INTRAMUSCULAR | Status: DC | PRN

## 2021-10-07 MED ORDER — PROPOFOL 500 MG/50ML IV EMUL
INTRAVENOUS | Status: DC | PRN
  Administered 2021-10-07: 50 ug/kg/min via INTRAVENOUS

## 2021-10-07 MED ORDER — SODIUM BICARBONATE 8.4 % IV SOLN
INTRAVENOUS | Status: DC | PRN
  Administered 2021-10-07: 50 mL via INTRAVENOUS

## 2021-10-07 MED ORDER — FENTANYL CITRATE (PF) 250 MCG/5ML IJ SOLN
INTRAMUSCULAR | Status: AC
  Filled 2021-10-07: qty 5

## 2021-10-07 MED ORDER — THROMBIN 20000 UNITS EX SOLR
CUTANEOUS | Status: DC | PRN
  Administered 2021-10-07: 20 mL via TOPICAL

## 2021-10-07 MED ORDER — DELFLEX-LC/1.5% DEXTROSE 344 MOSM/L IP SOLN: INTRAPERITONEAL | Status: DC

## 2021-10-07 MED ORDER — ALLOPURINOL 100 MG PO TABS
100.0000 mg | ORAL_TABLET | Freq: Every day | ORAL | Status: DC
  Administered 2021-10-07: 100 mg via ORAL
  Filled 2021-10-07 (×2): qty 1

## 2021-10-07 MED ORDER — THROMBIN 5000 UNITS EX SOLR
OROMUCOSAL | Status: DC | PRN
  Administered 2021-10-07: 5 mL via TOPICAL

## 2021-10-07 MED ORDER — CHLORHEXIDINE GLUCONATE 0.12 % MT SOLN: 15.0000 mL | Freq: Once | OROMUCOSAL | Status: AC

## 2021-10-07 MED ORDER — OXYCODONE HCL 5 MG/5ML PO SOLN: 5.0000 mg | Freq: Once | ORAL | Status: DC | PRN

## 2021-10-07 MED ORDER — PANTOPRAZOLE SODIUM 40 MG PO TBEC
40.0000 mg | DELAYED_RELEASE_TABLET | Freq: Every day | ORAL | Status: DC
  Administered 2021-10-07: 40 mg via ORAL
  Filled 2021-10-07: qty 1

## 2021-10-07 MED ORDER — SODIUM CHLORIDE 0.9% IV SOLUTION: Freq: Once | INTRAVENOUS | Status: DC

## 2021-10-07 MED ORDER — FENTANYL CITRATE (PF) 100 MCG/2ML IJ SOLN
INTRAMUSCULAR | Status: DC | PRN
  Administered 2021-10-07: 100 ug via INTRAVENOUS
  Administered 2021-10-07: 50 ug via INTRAVENOUS
  Administered 2021-10-07: 100 ug via INTRAVENOUS

## 2021-10-07 MED ORDER — BACITRACIN ZINC 500 UNIT/GM EX OINT
TOPICAL_OINTMENT | CUTANEOUS | Status: AC
Start: 2021-10-07 — End: ?
  Filled 2021-10-07: qty 28.35

## 2021-10-07 MED ORDER — SODIUM CHLORIDE 0.9 % IV SOLN: INTRAVENOUS | Status: DC

## 2021-10-07 MED ORDER — PHENYLEPHRINE HCL-NACL 20-0.9 MG/250ML-% IV SOLN
INTRAVENOUS | Status: DC | PRN
  Administered 2021-10-07: 20 ug/min via INTRAVENOUS

## 2021-10-07 SURGICAL SUPPLY — 68 items
APPLICATOR COTTON TIP 6 STRL (MISCELLANEOUS) IMPLANT
APPLICATOR COTTON TIP 6IN STRL (MISCELLANEOUS) ×4 IMPLANT
BAG COUNTER SPONGE SURGICOUNT (BAG) ×2 IMPLANT
BENZOIN TINCTURE PRP APPL 2/3 (GAUZE/BANDAGES/DRESSINGS) IMPLANT
BLADE CLIPPER SURG (BLADE) ×2 IMPLANT
BLADE ULTRA TIP 2M (BLADE) ×2 IMPLANT
BNDG GAUZE ELAST 4 BULKY (GAUZE/BANDAGES/DRESSINGS) ×2 IMPLANT
BNDG STRETCH 4X75 NS LF (GAUZE/BANDAGES/DRESSINGS) ×1 IMPLANT
BUR ACORN 6.0 PRECISION (BURR) ×1 IMPLANT
BUR MATCHSTICK NEURO 3.0 LAGG (BURR) IMPLANT
BUR SPIRAL ROUTER 2.3 (BUR) IMPLANT
CANISTER SUCT 3000ML PPV (MISCELLANEOUS) ×2 IMPLANT
CARTRIDGE OIL MAESTRO DRILL (MISCELLANEOUS) ×1 IMPLANT
CLIP RANEY DISP (INSTRUMENTS) ×1 IMPLANT
CLIP VESOCCLUDE MED 6/CT (CLIP) IMPLANT
DIFFUSER DRILL AIR PNEUMATIC (MISCELLANEOUS) ×2 IMPLANT
DRAPE NEUROLOGICAL W/INCISE (DRAPES) ×2 IMPLANT
DRAPE SURG 17X23 STRL (DRAPES) IMPLANT
DRAPE WARM FLUID 44X44 (DRAPES) ×2 IMPLANT
DURAGUARD 06CMX08CM ×2 IMPLANT
DURAPREP 6ML APPLICATOR 50/CS (WOUND CARE) ×2 IMPLANT
ELECT REM PT RETURN 9FT ADLT (ELECTROSURGICAL) ×2
ELECTRODE REM PT RTRN 9FT ADLT (ELECTROSURGICAL) ×1 IMPLANT
EVACUATOR 1/8 PVC DRAIN (DRAIN) IMPLANT
EVACUATOR SILICONE 100CC (DRAIN) ×1 IMPLANT
GAUZE 4X4 16PLY ~~LOC~~+RFID DBL (SPONGE) IMPLANT
GAUZE SPONGE 4X4 12PLY STRL (GAUZE/BANDAGES/DRESSINGS) ×5 IMPLANT
GLOVE BIO SURGEON STRL SZ7 (GLOVE) ×2 IMPLANT
GLOVE BIOGEL PI IND STRL 7.5 (GLOVE) IMPLANT
GLOVE BIOGEL PI INDICATOR 7.5 (GLOVE) ×2
GLOVE ECLIPSE 6.5 STRL STRAW (GLOVE) ×2 IMPLANT
GLOVE ECLIPSE 7.0 STRL STRAW (GLOVE) ×1 IMPLANT
GLOVE EXAM NITRILE XL STR (GLOVE) IMPLANT
GOWN STRL REUS W/ TWL LRG LVL3 (GOWN DISPOSABLE) ×2 IMPLANT
GOWN STRL REUS W/ TWL XL LVL3 (GOWN DISPOSABLE) IMPLANT
GOWN STRL REUS W/TWL 2XL LVL3 (GOWN DISPOSABLE) IMPLANT
GOWN STRL REUS W/TWL LRG LVL3 (GOWN DISPOSABLE) ×4
GOWN STRL REUS W/TWL XL LVL3 (GOWN DISPOSABLE)
HEMOSTAT SURGICEL 2X14 (HEMOSTASIS) IMPLANT
HOOK DURA 1/2IN (MISCELLANEOUS) ×1 IMPLANT
KIT BASIN OR (CUSTOM PROCEDURE TRAY) ×2 IMPLANT
KIT TURNOVER KIT B (KITS) ×2 IMPLANT
NDL HYPO 25X1 1.5 SAFETY (NEEDLE) ×1 IMPLANT
NEEDLE HYPO 25X1 1.5 SAFETY (NEEDLE) ×2 IMPLANT
NS IRRIG 1000ML POUR BTL (IV SOLUTION) ×6 IMPLANT
OIL CARTRIDGE MAESTRO DRILL (MISCELLANEOUS) ×2
PACK CRANIOTOMY CUSTOM (CUSTOM PROCEDURE TRAY) ×2 IMPLANT
PATTIES SURGICAL .5 X.5 (GAUZE/BANDAGES/DRESSINGS) IMPLANT
PATTIES SURGICAL .5 X3 (DISPOSABLE) IMPLANT
PATTIES SURGICAL 1X1 (DISPOSABLE) IMPLANT
SPONGE NEURO XRAY DETECT 1X3 (DISPOSABLE) IMPLANT
SPONGE SURGIFOAM ABS GEL 100 (HEMOSTASIS) ×2 IMPLANT
STAPLER VISISTAT 35W (STAPLE) ×3 IMPLANT
STOCKINETTE TUBULAR 6 INCH (GAUZE/BANDAGES/DRESSINGS) ×1 IMPLANT
SUT ETHILON 3 0 FSL (SUTURE) IMPLANT
SUT ETHILON 3 0 PS 1 (SUTURE) IMPLANT
SUT NURALON 4 0 TR CR/8 (SUTURE) ×7 IMPLANT
SUT STEEL 0 (SUTURE)
SUT STEEL 0 18XMFL TIE 17 (SUTURE) IMPLANT
SUT VIC AB 2-0 CT2 18 VCP726D (SUTURE) ×6 IMPLANT
TAPE SURG TRANSPORE 1 IN (GAUZE/BANDAGES/DRESSINGS) IMPLANT
TAPE SURGICAL TRANSPORE 1 IN (GAUZE/BANDAGES/DRESSINGS) ×2
TOWEL GREEN STERILE (TOWEL DISPOSABLE) ×2 IMPLANT
TOWEL GREEN STERILE FF (TOWEL DISPOSABLE) ×2 IMPLANT
TRAY FOLEY MTR SLVR 16FR STAT (SET/KITS/TRAYS/PACK) ×2 IMPLANT
TUBE CONNECTING 12X1/4 (SUCTIONS) ×2 IMPLANT
UNDERPAD 30X36 HEAVY ABSORB (UNDERPADS AND DIAPERS) ×2 IMPLANT
WATER STERILE IRR 1000ML POUR (IV SOLUTION) ×2 IMPLANT

## 2021-10-07 NOTE — Anesthesia Procedure Notes (Signed)
Procedure Name: Intubation Date/Time: 10/07/2021 7:40 PM  Performed by: Jahziah Simonin T, CRNAPre-anesthesia Checklist: Patient identified, Emergency Drugs available, Suction available and Patient being monitored Patient Re-evaluated:Patient Re-evaluated prior to induction Oxygen Delivery Method: Circle system utilized Preoxygenation: Pre-oxygenation with 100% oxygen Induction Type: IV induction and Rapid sequence Ventilation: Mask ventilation without difficulty Laryngoscope Size: Miller and 3 Grade View: Grade II Tube type: Oral Tube size: 7.5 mm Number of attempts: 1 Airway Equipment and Method: Stylet and Oral airway Placement Confirmation: ETT inserted through vocal cords under direct vision, positive ETCO2 and breath sounds checked- equal and bilateral Secured at: 23 cm Tube secured with: Tape Dental Injury: Teeth and Oropharynx as per pre-operative assessment

## 2021-10-07 NOTE — Op Note (Signed)
10/07/2021  11:11 PM  PATIENT:  Daniel Solomon  61 y.o. male With increased shift PRE-OPERATIVE DIAGNOSIS:  right Epidural and Subdural hematoma  POST-OPERATIVE DIAGNOSIS:  right Epidural and Subdural hematoma  PROCEDURE:  Procedure(s): right Craniectomy for Recurrent  Intracranial Hematoma  SURGEON: Surgeon(s): Ashok Pall, MD  ASSISTANTS:none  ANESTHESIA:   general  EBL:  Total I/O In: 3330 [I.V.:2200; Blood:630; IV Piggyback:500] Out: 1100 [Urine:700; Blood:400]  BLOOD ADMINISTERED: 630 CC PRBC  CELL SAVER GIVEN:none  COUNT:per nursing  DRAINS: none   SPECIMEN:  No Specimen  DICTATION: [name] was taken to the operating room, intubated, and placed under a general anesthetic without difficulty. he was positioned supine on a horseshoe headrest. His staples were removed and his head was prepped and draped in a sterile manner. I opened the incision, divided the sutures, and reflected the flap anteriorly. I cut the sutures approximating the temporalis muscle. I reflected the muscle with the scalp. I removed the skull flap by removing the skull side screws. Once I removed the flap I removed the dural substitute which essentially had acted like a sponge absorbing a good deal of blood and lying on the surface of the brain. The brain had no clotted blood on its surface. I irrigated the subdural space with saline. I performed a generous duraplasty. During this time there was some swelling of the brain. It remained pulsatile, but I did not feel replacing the skull flap was beneficial. I placed numerous dural tack up sutures around the periphery of the craniectomy. I closed by approximating the temporalis, then the galea, finally the scalp edges. He remained intubated and was brought to the icu.  PLAN OF CARE: Admit to inpatient   PATIENT DISPOSITION:  PACU - hemodynamically stable.   Delay start of Pharmacological VTE agent (>24hrs) due to surgical blood loss or risk of  bleeding:  no

## 2021-10-07 NOTE — Progress Notes (Signed)
OT Cancellation Note  Patient Details Name: Daniel Solomon MRN: [id number] DOB: May 07, 1960   Cancelled Treatment:    Reason Eval/Treat Not Completed: Patient at procedure or test/ unavailable (going to CT)  [facility] 10/07/2021, 2:30 PM Maurie Boettcher, OT/L   Acute OT Clinical Specialist Acute Rehabilitation Services Pager [phone] Office [phone]

## 2021-10-07 NOTE — Evaluation (Signed)
Physical Therapy Evaluation Patient Details Name: Daniel Solomon MRN: [id number] DOB: Jun 20, 1960 Today's Date: 10/07/2021  History of Present Illness  Pt is a 61 y.o. male who presented 10/05/21 with repeat head CT showing an increasing amount of subdural blood and L to R shift. 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 S/p craniotomy hematoma evacuation subdural 8/8. PMH: anemia, bicuspid aortic valve, CAD, enlarged aorta, ESRD on peritoneal dialysis, glaucoma, gout, hypercholesterolemia, HTN, mild aortic stenosis, PAF, s/p TAVR, SDH, sleep apnea   Clinical Impression  Pt presents with condition above and deficits mentioned below, see PT Problem List. PTA, he was IND without DME, working "in computers", driving, and living with his wife in a 2-level house (can stay on main level) with 2-5 STE. Currently, pt is requiring mod-max cues for all functional mobility along with minA physically for transfers and min-modA physically to ambulate up to ~35 ft with a RW. He demonstrates deficits in communication, cognition, balance, activity tolerance, R-side attention, potential R-sided proprioception, and R-sided strength. He is at high risk for falls and would greatly benefit from intensive therapy in the AIR setting. Will continue to follow acutely.       Recommendations for follow up therapy are one component of a multi-disciplinary discharge planning process, led by the attending physician.  Recommendations may be updated based on patient status, additional functional criteria and insurance authorization.  Follow Up Recommendations Acute inpatient rehab (3hours/day)      Assistance Recommended at Discharge Frequent or constant Supervision/Assistance  Patient can return home with the following  A lot of help with walking and/or transfers;A lot of help with bathing/dressing/bathroom;Assistance with cooking/housework;Direct supervision/assist for medications  management;Direct supervision/assist for financial management;Assist for transportation;Help with stairs or ramp for entrance    Equipment Recommendations Rolling walker (2 wheels);BSC/3in1  Recommendations for Other Services  Rehab consult;Speech consult    Functional Status Assessment Patient has had a recent decline in their functional status and demonstrates the ability to make significant improvements in function in a reasonable and predictable amount of time.     Precautions / Restrictions Precautions Precautions: Fall Restrictions Weight Bearing Restrictions: No      Mobility  Bed Mobility               General bed mobility comments: Pt up in recliner upon arrival.    Transfers Overall transfer level: Needs assistance Equipment used: Rolling walker (2 wheels) Transfers: Sit to/from Stand Sit to Stand: Min assist, Max assist           General transfer comment: When pt initiated the sit > stand from recliner while pulling up on RW, he required minA for stability and to power up to stand. Mod cues. When pt was lethargic and not wanting to attempt to stand he required maxA    Ambulation/Gait Ambulation/Gait assistance: Min assist, Mod assist Gait Distance (Feet): 35 Feet Assistive device: Rolling walker (2 wheels) Gait Pattern/deviations: Step-to pattern, Decreased step length - right, Decreased weight shift to right, Decreased weight shift to left, Decreased dorsiflexion - right, Trunk flexed Gait velocity: reduced Gait velocity interpretation: <1.31 ft/sec, indicative of household ambulator   General Gait Details: Pt with slow gait and with decreased R step length, benefiting from tactile cues to advance R leg. Pt also requiring tactile cues for weight shifting bil to continue ambulating at times. Pt needing repeated cues to position R hand back on R hand grip of RW. Pt stopping often and needing  cues to gain attention back on task. Min-modA for stability, but  max cues.  Stairs            Wheelchair Mobility    Modified Rankin (Stroke Patients Only) Modified Rankin (Stroke Patients Only) Pre-Morbid Rankin Score: No symptoms Modified Rankin: Moderately severe disability     Balance Overall balance assessment: Needs assistance         Standing balance support: Bilateral upper extremity supported, During functional activity, Reliant on assistive device for balance Standing balance-Leahy Scale: Poor Standing balance comment: Reliant on RW and external physical assistance                             Pertinent Vitals/Pain Pain Assessment Pain Assessment: Faces Faces Pain Scale: Hurts little more Pain Location: grimacing noted intermittently Pain Descriptors / Indicators: Grimacing Pain Intervention(s): Limited activity within patient's tolerance, Monitored during session, Repositioned    Home Living Family/patient expects to be discharged to:: Private residence Living Arrangements: Spouse/significant other Available Help at Discharge: Family;Available 24 hours/day Type of Home: House Home Access: Stairs to enter Entrance Stairs-Rails: None Entrance Stairs-Number of Steps: 2-5 (2 in garage, 5 in front)   Home Layout: Two level;Able to live on main level with bedroom/bathroom Home Equipment: None Additional Comments: wife can work from home, mother lives next door    Prior Function Prior Level of Function : Independent/Modified Independent;Driving;Working/employed             Mobility Comments: No AD. 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     Hand Dominance   Dominant Hand: Right    Extremity/Trunk Assessment   Upper Extremity Assessment Upper Extremity Assessment: Defer to OT evaluation    Lower Extremity Assessment Lower Extremity Assessment: RLE deficits/detail;Difficult to assess due to impaired cognition RLE Deficits / Details: Noted functional weakness, but able to lift  against gravity; noted potential proprioception deficits with functional mobility, or more related to R inattention    Cervical / Trunk Assessment Cervical / Trunk Assessment: Kyphotic  Communication   Communication: Receptive difficulties;Expressive difficulties  Cognition Arousal/Alertness: Lethargic, Awake/alert Behavior During Therapy: Flat affect Overall Cognitive Status: Impaired/Different from baseline Area of Impairment: Attention, Memory, Following commands, Safety/judgement, Awareness, Problem solving                   Current Attention Level: Focused Memory: Decreased short-term memory Following Commands: Follows one step commands inconsistently, Follows one step commands with increased time Safety/Judgement: Decreased awareness of safety, Decreased awareness of deficits Awareness: Intellectual Problem Solving: Slow processing, Decreased initiation, Difficulty sequencing, Requires verbal cues, Requires tactile cues General Comments: Pt lethargic, often closing eyes when sitting in chair, but able to keep eyes open when ambulating. Pt with flat affect and needing continual stimulation to maintain attention, even just briefly. Pt inattentive to R side, needing repeated multi-modal cues to look and reposition R hand often. Pt following simple cues ~10-15% of time and appears to have receptive language deficits. Pt also only stating "yeah ok" repeatedly. One time did state "aw come on" when PT appeared to have annoyed him by asking him to stand one more time.        General Comments General comments (skin integrity, edema, etc.): VSS on RA    Exercises     Assessment/Plan    PT Assessment Patient needs continued PT services  PT Problem List Decreased strength;Decreased activity tolerance;Decreased balance;Decreased mobility;Decreased cognition;Decreased coordination;Decreased knowledge of use  of DME;Decreased safety awareness;Impaired sensation       PT Treatment  Interventions DME instruction;Gait training;Stair training;Therapeutic activities;Functional mobility training;Therapeutic exercise;Balance training;Neuromuscular re-education;Cognitive remediation;Patient/family education    PT Goals (Current goals can be found in the Care Plan section)  Acute Rehab PT Goals Patient Stated Goal: pt did not state; mother wants pt to improve PT Goal Formulation: With patient/family Time For Goal Achievement: 10/21/21 Potential to Achieve Goals: Good    Frequency Min 4X/week     Co-evaluation               AM-PAC PT "6 Clicks" Mobility  Outcome Measure Help needed turning from your back to your side while in a flat bed without using bedrails?: A Little Help needed moving from lying on your back to sitting on the side of a flat bed without using bedrails?: A Lot Help needed moving to and from a bed to a chair (including a wheelchair)?: A Lot Help needed standing up from a chair using your arms (e.g., wheelchair or bedside chair)?: A Lot Help needed to walk in hospital room?: A Lot Help needed climbing 3-5 steps with a railing? : Total 6 Click Score: 12    End of Session Equipment Utilized During Treatment: Gait belt Activity Tolerance: Patient tolerated treatment well Patient left: in chair;with call bell/phone within reach;with chair alarm set;with family/visitor present Nurse Communication: Mobility status PT Visit Diagnosis: Unsteadiness on feet (R26.81);Other abnormalities of gait and mobility (R26.89);Muscle weakness (generalized) (M62.81);Difficulty in walking, not elsewhere classified (R26.2);Other symptoms and signs involving the nervous system (R29.898);Hemiplegia and hemiparesis Hemiplegia - Right/Left: Right Hemiplegia - dominant/non-dominant: Dominant Hemiplegia - caused by: Nontraumatic intracerebral hemorrhage    Time: 6808-8110 PT Time Calculation (min) (ACUTE ONLY): 28 min   Charges:   PT Evaluation $PT Eval Moderate  Complexity: 1 Mod PT Treatments $Gait Training: 8-22 mins        Moishe Spice, PT, DPT Acute Rehabilitation Services  Office: [phone]   Orvan Falconer 10/07/2021, 4:16 PM

## 2021-10-07 NOTE — Progress Notes (Signed)
NAME:  Daniel Solomon, MRN:  [id number], DOB:  [birth year], LOS: 2 ADMISSION DATE:  10/05/2021, CONSULTATION DATE:  8/8 REFERRING MD:  Christella Noa, CHIEF COMPLAINT:  post procedure ventilator management   History of Present Illness:  61 y/o male with multiple medical problems underwent an emergent craniotomy and hematoma evacuation on 8/8 in the setting of new onset aphasia and weakness due to an enlarging subdural hematoma.  He underwent an elective middle meningeal artery embolism on 8/1.    Pertinent  Medical History  ESRD CAD Hyperlipidemia Aortic stenosis Paroxysmal atrial fibrillation SDH Hypertension OSA Gout  Significant Hospital Events: Including procedures, antibiotic start and stop dates in addition to other pertinent events   8/8 admission, underwent craniotomy with hematoma evacuation, sent to ICU on vent 8/9 extubated  Interim History / Subjective:   More drowsy today On PD since last night Received percocet x3 in last 24 hours Didn't get CPAP due to scalp lacerations  Objective   Blood pressure 104/74, pulse 62, temperature 97.6 F (36.4 C), temperature source Oral, resp. rate 12, height '5\' 8"'$  (1.727 m), weight 87.8 kg, SpO2 98 %.    Vent Mode: PSV;CPAP FiO2 (%):  [40 %] 40 % Set Rate:  [16 bmp] 16 bmp Vt Set:  [540 mL] 540 mL PEEP:  [5 cmH20] 5 cmH20 Pressure Support:  [10 cmH20] 10 cmH20 Plateau Pressure:  [12 cmH20] 12 cmH20   Intake/Output Summary (Last 24 hours) at 10/07/2021 1540 Last data filed at 10/06/2021 1500 Gross per 24 hour  Intake 625.2 ml  Output 125 ml  Net 500.2 ml   Filed Weights   10/05/21 1716 10/05/21 2154  Weight: 88 kg 87.8 kg    Examination:  General:  Resting comfortably in chair HENT: R eye bruising/swelling, scalp incision site well healed, OP clear PULM: CTA B, normal effort CV: RRR, no mgr GI: BS+, soft, nontender MSK: normal bulk and tone Neuro: drowsy but wakes up, slowly follows commands   Resolved Hospital  Problem list     Assessment & Plan:  Left subacute subdural hematoma with brain compression Keppra to continue Post op care per neurosurgery Goal SBP < 140 Cleviprex for SBP goal  Remains critically ill due to acute metabolic encephalopathy due to uremia, narcotics, untreated OSA with brain compression from SDH Dialysis now Hold narcotics See if we can get home CPAP machine (if it will fit over scalp dressing) Head CT if no improvement after PD today Currently protecting airway and he has no respiratory failure concern or life threatening electrolyte abnormalities.  However if this changes will need HD  Need for mechanical ventilation after neurosurgery > resolved OSA> unable to use hospital masks due to craniotomy OSA > see if we can use home CPAP device, will ask wife to bring it in Aspiration precautions  Paroxysmal atrial fibrillation, previously on Eliquis Tele Give labetalol today Holding eliquis  CAD Aortic stenosis, s/p TAVR Tele Labetalol torsemide  Hypertension Cleviprex for SBP goal As above  Hyperlipidemia Atorvastatin  ESRD on Peritoneal dialysis, uremia PD today per renal > longer session planned Monitor BMET and UOP Replace electrolytes as needed See discussion above, may need HD if condition worsens  Gout Allopurinol, renal adjusted dose  Best Practice (right click and "Reselect all SmartList Selections" daily)   Diet/type: NPO DVT prophylaxis: SCD GI prophylaxis: N/A Lines: N/A Foley:  Yes, and it is no longer needed > remove Code Status:  full code Last date of multidisciplinary goals of care discussion [  per [place], wife updated bedside by [facility] on 8/10]  Critical care time: 35 minutes      Roselie Awkward, MD [facility] Pager: [phone] Cell: [phone] After 7:00 pm call Elink  [phone]

## 2021-10-07 NOTE — Anesthesia Preprocedure Evaluation (Addendum)
Anesthesia Evaluation  Patient identified by MRN, date of birth, ID band Patient confused    Reviewed: Allergy & Precautions, H&P , NPO status , Patient's Chart, lab work & pertinent test results, Unable to perform ROS - Chart review only  Airway Mallampati: III   Neck ROM: full    Dental  (+) Teeth Intact   Pulmonary sleep apnea and Continuous Positive Airway Pressure Ventilation ,    breath sounds clear to auscultation       Cardiovascular hypertension, Pt. on medications + CAD and + Cardiac Stents  + Valvular Problems/Murmurs  Rhythm:regular Rate:Normal  S/p TAVR for bicuspid aortic valve.   Neuro/Psych  Headaches, Subdural hematoma    GI/Hepatic negative GI ROS, Neg liver ROS,   Endo/Other  negative endocrine ROS  Renal/GU ESRF and DialysisRenal disease     Musculoskeletal   Abdominal   Peds  Hematology  (+) Blood dyscrasia, anemia ,   Anesthesia Other Findings   Reproductive/Obstetrics                            Anesthesia Physical  Anesthesia Plan  ASA: 4 and emergent  Anesthesia Plan: General   Post-op Pain Management:    Induction: Intravenous  PONV Risk Score and Plan: 2 and Ondansetron, Dexamethasone, Treatment may vary due to age or medical condition and Midazolam  Airway Management Planned: Oral ETT  Additional Equipment: Arterial line  Intra-op Plan:   Post-operative Plan: Extubation in OR  Informed Consent: I have reviewed the patients History and Physical, chart, labs and discussed the procedure including the risks, benefits and alternatives for the proposed anesthesia with the patient or authorized representative who has indicated his/her understanding and acceptance.     Dental advisory given and Consent reviewed with POA  Plan Discussed with: CRNA, Anesthesiologist and Surgeon  Anesthesia Plan Comments:        Anesthesia Quick Evaluation

## 2021-10-07 NOTE — Progress Notes (Signed)
[facility] Progress Note  Subjective: pt confused or at least not able to speak much today, just giving short responses. Is up in chair.   Vitals:   10/07/21 0300 10/07/21 0400 10/07/21 0500 10/07/21 0600  BP: 105/68 107/65 122/81 104/74  Pulse: 63 63 80 62  Resp: '13 14 17 12  '$ Temp:  97.6 F (36.4 C)    TempSrc:  Oral    SpO2: 92% 96% 95% 98%  Weight:      Height:        Exam:  alert, nad   no jvd  Chest cta bilat  Cor reg no RG  Abd soft ntnd no ascites   Ext no LE edema   Alert, NF, ox3   RLQ PD cath intact, clear exit site    Home meds include - allopurinol, atorvastatin, auryxia 3 ac tid, calcitriol 0.25 qd, docusate, hydrocodone-aceta prn, labetalol 100 bid, levetiracetam, torsemide 100 qd, prns/ vits / supps        OP PD: CCPD [facility] 7d /wk 93kg  5 overnight, 3000 fill, last fill 2500 for daybag - last Hb 11.6 on 7/7, tsat 14%, ferr 754 - no esa or IV fe currently     Assessment/ Plan: SDH w/ brain compression - sp SDH evac and craniotomy 8/8 AMS - possibly combination or uremia, surgery, edema, pain meds. His baseline creat is 11-14, will repeat labs after PD completed this am to help determine if uremia is contributing or not.  PAF - holding eliquis for now, per cards/ NSGY ESRD - on CCPD. Has missed some PD sessions recently, creat up at 18. Had full PD last night comes off at 10 am this am. Repeat labs post PD as above. Plan full PD again tonight.  CAD hx PCI / sp TAVR HTN - on home labetalol 100 bid and torsemide '100mg'$  qd. IV cleviprex per pmd. SBP goal < 140 for now.  Volume - euvolemic on exam, under dry wt. Cont to use lowest UF fluids (1.5%) w/ PD.  Anemia esrd - not on esa, Hb 11 > 8.1. Transfuse prn.  MBD ckd - CCa in range, phos is high. Cont daily vdra, and cont auryxia as binder.       Rob Suleima Ohlendorf 10/07/2021, 7:35 AM   Recent Labs  Lab 10/05/21 1250 10/05/21 2255 10/05/21 2304 10/06/21 1042 10/07/21 0249  HGB 11.0* 9.5*  8.7*  --  8.1*  ALBUMIN  --   --   --  3.2* 2.8*  CALCIUM 9.7  --   --   --  9.3  PHOS  --   --   --  9.2*  --   CREATININE 18.17*  --   --   --  18.03*  K 4.3 5.0  --   --  4.6   Recent Labs  Lab 10/06/21 1042  IRON 79  TIBC 150*   Inpatient medications:  allopurinol  300 mg Oral Daily   atorvastatin  40 mg Oral Daily   calcitRIOL  0.25 mcg Oral Daily   Chlorhexidine Gluconate Cloth  6 each Topical Daily   ferric citrate  630 mg Oral TID WC   gentamicin cream  1 Application Topical Daily   labetalol  100 mg Oral BID   levETIRAcetam  500 mg Oral BID   pantoprazole sodium  40 mg Oral Daily   senna  1 tablet Oral BID   torsemide  100 mg Oral Daily    sodium chloride 10 mL/hr  at 10/05/21 1718   clevidipine Stopped (10/06/21 1125)   dialysis solution 1.5% low-MG/low-CA     dialysis solution 1.5% low-MG/low-CA     phenylephrine (NEO-SYNEPHRINE) Adult infusion     acetaminophen **OR** acetaminophen, bisacodyl, HYDROcodone-acetaminophen, labetalol, naLOXone (NARCAN)  injection, ondansetron **OR** ondansetron (ZOFRAN) IV

## 2021-10-07 NOTE — Anesthesia Procedure Notes (Signed)
Arterial Line Insertion Start/End8/11/2021 7:44 PM, 10/07/2021 7:46 PM Performed by: Effie Berkshire, MD  Patient location: Pre-op. Preanesthetic checklist: patient identified, IV checked, site marked, risks and benefits discussed, surgical consent, monitors and equipment checked, pre-op evaluation, timeout performed and anesthesia consent Lidocaine 1% used for infiltration Right, radial was placed Catheter size: 20 G Hand hygiene performed  and maximum sterile barriers used   Attempts: 1 Procedure performed without using ultrasound guided technique. Following insertion, dressing applied and Biopatch. Post procedure assessment: normal and unchanged  Patient tolerated the procedure well with no immediate complications.

## 2021-10-07 NOTE — Progress Notes (Signed)
? ?  Inpatient Rehab Admissions Coordinator : ? ?Per therapy recommendations, patient was screened for CIR candidacy by Onika Gudiel RN MSN.  At this time patient appears to be a potential candidate for CIR. I will place a rehab consult per protocol for full assessment. Please call me with any questions. ? ?Patrizia Paule RN MSN ?Admissions Coordinator ?[phone] ?  ?

## 2021-10-07 NOTE — Transfer of Care (Signed)
Immediate Anesthesia Transfer of Care Note  Patient: Daniel Solomon  Procedure(s) Performed: Craniectomy for Recurrent  Intracranial Hematoma (Left: Head)  Patient Location: ICU  Anesthesia Type:General  Level of Consciousness: Patient remains intubated per anesthesia plan  Airway & Oxygen Therapy: Patient remains intubated per anesthesia plan and Patient placed on Ventilator (see vital sign flow sheet for setting)  Post-op Assessment: Report given to RN and Post -op Vital signs reviewed and stable  Post vital signs: Reviewed and stable  Last Vitals:  Vitals Value Taken Time  BP 136/95 10/07/21 2234  Temp    Pulse 79 10/07/21 2248  Resp 16 10/07/21 2248  SpO2 100 % 10/07/21 2248  Vitals shown include unvalidated device data.  Last Pain:  Vitals:   10/07/21 1600  TempSrc: Oral  PainSc: Asleep      Patients Stated Pain Goal: 0 (08/81/10 3159)  Complications: No notable events documented.

## 2021-10-07 NOTE — Progress Notes (Signed)
Patient ID: Daniel Solomon, male   DOB: 1960-06-17, 61 y.o.   MRN: [id number] BP (!) 131/93   Pulse 78   Temp 97.7 F (36.5 C) (Oral)   Resp 20   Ht '5\' 8"'$  (1.727 m)   Wt 86.3 kg   SpO2 99%   BMI 28.93 kg/m  Repeat head ct showed increased shift. Will take to the or for redo crani Moving all extremities, expressive aphasia Wound is clean and dry

## 2021-10-08 ENCOUNTER — Inpatient Hospital Stay (HOSPITAL_COMMUNITY): Payer: Managed Care, Other (non HMO)

## 2021-10-08 ENCOUNTER — Encounter (HOSPITAL_COMMUNITY): Payer: Self-pay | Admitting: Neurosurgery

## 2021-10-08 DIAGNOSIS — S065XAA Traumatic subdural hemorrhage with loss of consciousness status unknown, initial encounter: Secondary | ICD-10-CM | POA: Diagnosis not present

## 2021-10-08 DIAGNOSIS — Z9889 Other specified postprocedural states: Secondary | ICD-10-CM | POA: Diagnosis not present

## 2021-10-08 DIAGNOSIS — Z9911 Dependence on respirator [ventilator] status: Secondary | ICD-10-CM | POA: Diagnosis not present

## 2021-10-08 LAB — GLUCOSE, CAPILLARY
Glucose-Capillary: 133 mg/dL — ABNORMAL HIGH (ref 70–99)
Glucose-Capillary: 138 mg/dL — ABNORMAL HIGH (ref 70–99)

## 2021-10-08 LAB — BASIC METABOLIC PANEL
Anion gap: 17 — ABNORMAL HIGH (ref 5–15)
BUN: 96 mg/dL — ABNORMAL HIGH (ref 6–20)
CO2: 18 mmol/L — ABNORMAL LOW (ref 22–32)
Calcium: 8.6 mg/dL — ABNORMAL LOW (ref 8.9–10.3)
Chloride: 102 mmol/L (ref 98–111)
Creatinine, Ser: 16.34 mg/dL — ABNORMAL HIGH (ref 0.61–1.24)
GFR, Estimated: 3 mL/min — ABNORMAL LOW (ref >60)
Glucose, Bld: 116 mg/dL — ABNORMAL HIGH (ref 70–99)
Potassium: 4.5 mmol/L (ref 3.5–5.1)
Sodium: 137 mmol/L (ref 135–145)

## 2021-10-08 LAB — BPAM RBC
Blood Product Expiration Date: 202308312359
Blood Product Expiration Date: 202308312359
ISSUE DATE / TIME: 202308102011
ISSUE DATE / TIME: 202308102011
Unit Type and Rh: 5100
Unit Type and Rh: 5100

## 2021-10-08 LAB — TYPE AND SCREEN
ABO/RH(D): O POS
Antibody Screen: NEGATIVE
Unit division: 0
Unit division: 0

## 2021-10-08 LAB — CBC
HCT: 27 % — ABNORMAL LOW (ref 39.0–52.0)
Hemoglobin: 9.3 g/dL — ABNORMAL LOW (ref 13.0–17.0)
MCH: 33.6 pg (ref 26.0–34.0)
MCHC: 34.4 g/dL (ref 30.0–36.0)
MCV: 97.5 fL (ref 80.0–100.0)
Platelets: 96 10*3/uL — ABNORMAL LOW (ref 150–400)
RBC: 2.77 MIL/uL — ABNORMAL LOW (ref 4.22–5.81)
RDW: 16.6 % — ABNORMAL HIGH (ref 11.5–15.5)
WBC: 8.8 10*3/uL (ref 4.0–10.5)
nRBC: 0 % (ref 0.0–0.2)

## 2021-10-08 LAB — PROTIME-INR
INR: 1.5 — ABNORMAL HIGH (ref 0.8–1.2)
Prothrombin Time: 17.7 seconds — ABNORMAL HIGH (ref 11.4–15.2)

## 2021-10-08 LAB — APTT: aPTT: 30 seconds (ref 24–36)

## 2021-10-08 MED ORDER — SENNOSIDES 8.8 MG/5ML PO SYRP
5.0000 mL | ORAL_SOLUTION | Freq: Two times a day (BID) | ORAL | Status: DC
Start: 2021-10-08 — End: 2021-10-13
  Administered 2021-10-08 – 2021-10-12 (×4): 5 mL
  Filled 2021-10-08 (×9): qty 5

## 2021-10-08 MED ORDER — ONDANSETRON HCL 4 MG PO TABS
4.0000 mg | ORAL_TABLET | ORAL | Status: DC | PRN
  Administered 2021-10-11 – 2021-10-12 (×2): 4 mg
  Filled 2021-10-08 (×2): qty 1

## 2021-10-08 MED ORDER — PROSOURCE TF20 ENFIT COMPATIBL EN LIQD
60.0000 mL | Freq: Two times a day (BID) | ENTERAL | Status: DC
  Administered 2021-10-08 – 2021-10-12 (×9): 60 mL
  Filled 2021-10-08 (×9): qty 60

## 2021-10-08 MED ORDER — LEVETIRACETAM 100 MG/ML PO SOLN
500.0000 mg | Freq: Two times a day (BID) | ORAL | Status: DC
Start: 2021-10-08 — End: 2021-10-13
  Administered 2021-10-08 – 2021-10-13 (×11): 500 mg
  Filled 2021-10-08 (×11): qty 5

## 2021-10-08 MED ORDER — HYDROCODONE-ACETAMINOPHEN 5-325 MG PO TABS
1.0000 | ORAL_TABLET | ORAL | Status: DC | PRN
  Administered 2021-10-09 – 2021-10-13 (×11): 1
  Filled 2021-10-08 (×11): qty 1

## 2021-10-08 MED ORDER — CALCITRIOL 1 MCG/ML PO SOLN
0.2500 ug | Freq: Every day | ORAL | Status: DC
Start: 2021-10-08 — End: 2021-10-13
  Administered 2021-10-08 – 2021-10-13 (×6): 0.25 ug
  Filled 2021-10-08 (×6): qty 0.25

## 2021-10-08 MED ORDER — OSMOLITE 1.5 CAL PO LIQD
1000.0000 mL | ORAL | Status: DC
  Administered 2021-10-08 – 2021-10-12 (×5): 1000 mL

## 2021-10-08 MED ORDER — DELFLEX-LC/1.5% DEXTROSE 344 MOSM/L IP SOLN: INTRAPERITONEAL | Status: DC

## 2021-10-08 MED ORDER — LABETALOL HCL 100 MG PO TABS
100.0000 mg | ORAL_TABLET | Freq: Two times a day (BID) | ORAL | Status: DC
  Administered 2021-10-08 – 2021-10-13 (×10): 100 mg
  Filled 2021-10-08 (×11): qty 1

## 2021-10-08 MED ORDER — ALLOPURINOL 100 MG PO TABS
100.0000 mg | ORAL_TABLET | Freq: Every day | ORAL | Status: DC
Start: 2021-10-08 — End: 2021-10-13
  Administered 2021-10-08 – 2021-10-13 (×6): 100 mg
  Filled 2021-10-08 (×6): qty 1

## 2021-10-08 MED ORDER — ACETAMINOPHEN 325 MG PO TABS
650.0000 mg | ORAL_TABLET | ORAL | Status: DC | PRN
  Administered 2021-10-10 – 2021-10-13 (×7): 650 mg
  Filled 2021-10-08 (×7): qty 2

## 2021-10-08 MED ORDER — PROPOFOL 1000 MG/100ML IV EMUL
INTRAVENOUS | Status: AC
  Filled 2021-10-08: qty 100

## 2021-10-08 MED ORDER — PANTOPRAZOLE 2 MG/ML SUSPENSION
40.0000 mg | Freq: Every day | ORAL | Status: DC
  Administered 2021-10-08 – 2021-10-13 (×6): 40 mg
  Filled 2021-10-08 (×5): qty 20

## 2021-10-08 MED ORDER — TORSEMIDE 100 MG PO TABS
100.0000 mg | ORAL_TABLET | Freq: Every day | ORAL | Status: DC
Start: 2021-10-08 — End: 2021-10-10
  Administered 2021-10-08 – 2021-10-10 (×3): 100 mg
  Filled 2021-10-08 (×3): qty 1

## 2021-10-08 MED ORDER — ONDANSETRON HCL 4 MG/2ML IJ SOLN
4.0000 mg | INTRAMUSCULAR | Status: DC | PRN
  Administered 2021-10-13: 4 mg via INTRAVENOUS
  Filled 2021-10-08 (×3): qty 2

## 2021-10-08 MED ORDER — ATORVASTATIN CALCIUM 40 MG PO TABS
40.0000 mg | ORAL_TABLET | Freq: Every day | ORAL | Status: DC
Start: 2021-10-08 — End: 2021-10-13
  Administered 2021-10-08 – 2021-10-13 (×6): 40 mg
  Filled 2021-10-08 (×6): qty 1

## 2021-10-08 MED ORDER — PROPOFOL 1000 MG/100ML IV EMUL
5.0000 ug/kg/min | INTRAVENOUS | Status: DC
  Administered 2021-10-08 (×2): 40 ug/kg/min via INTRAVENOUS
  Filled 2021-10-08: qty 100

## 2021-10-08 MED ORDER — ACETAMINOPHEN 650 MG RE SUPP: 650.0000 mg | RECTAL | Status: DC | PRN

## 2021-10-08 NOTE — Progress Notes (Signed)
Patient ID: Daniel Solomon, male   DOB: [birth year], 61 y.o.   MRN: [id number] BP (!) 148/81   Pulse 98   Temp 98.9 F (37.2 C) (Axillary)   Resp 15   Ht '5\' 8"'$  (1.727 m)   Wt 86.3 kg   SpO2 99%   BMI 28.93 kg/m  Lethargic, being extubated currently Will move extremities Dressing blood stained, will check coags Right pupil reactive, unable to open left eye

## 2021-10-08 NOTE — Progress Notes (Signed)
OT Cancellation Note  Patient Details Name: Daniel Solomon MRN: [id number] DOB: 1960/09/28   Cancelled Treatment:    Reason Eval/Treat Not Completed: Patient not medically ready. RN requesting hold until potential extubation this afternoon. Will plan to follow-up as time permits.  [place] [address], OTR/L Acute Re[facility] Services [facility] [phone] Office [phone]    Almon Register 10/08/2021, 11:03 AM

## 2021-10-08 NOTE — Progress Notes (Signed)
NAME:  Daniel Solomon, MRN:  [id number], DOB:  [birth year], LOS: 3 ADMISSION DATE:  10/05/2021, CONSULTATION DATE:  8/8 REFERRING MD:  Christella Noa, CHIEF COMPLAINT:  post procedure ventilator management   History of Present Illness:  61 y/o male with multiple medical problems underwent an emergent craniotomy and hematoma evacuation on 8/8 in the setting of new onset aphasia and weakness due to an enlarging subdural hematoma.  He underwent an elective middle meningeal artery embolism on 8/1.  Craniotomy with hematoma evacuation on 8/8, then extubated on 8/9.  Pertinent  Medical History  ESRD CAD Hyperlipidemia Aortic stenosis Paroxysmal atrial fibrillation SDH Hypertension OSA Gout  Significant Hospital Events: Including procedures, antibiotic start and stop dates in addition to other pertinent events   8/8 admission, underwent craniotomy with hematoma evacuation, sent to ICU on vent 8/9 extubated 8/10 back to the OR for redo craniotomy given increased shift noted on head CT  Interim History / Subjective:   Progressive mass effect with 15 mm midline shift on head CT 8/10 Back to the OR for redo craniotomy overnight, return to the ICU sedated on propofol and intubated Currently some agitation as propofol lifted but is tolerating PSV  Objective   Blood pressure (!) 108/58, pulse 77, temperature 98.4 F (36.9 C), temperature source Oral, resp. rate 15, height '5\' 8"'$  (1.727 m), weight 86.3 kg, SpO2 99 %.    Vent Mode: PSV;CPAP FiO2 (%):  [40 %] 40 % Set Rate:  [16 bmp] 16 bmp Vt Set:  [540 mL] 540 mL PEEP:  [5 cmH20] 5 cmH20 Pressure Support:  [8 cmH20] 8 cmH20 Plateau Pressure:  [12 cmH20-13 cmH20] 13 cmH20   Intake/Output Summary (Last 24 hours) at 10/08/2021 1000 Last data filed at 10/08/2021 0930 Gross per 24 hour  Intake 3557.24 ml  Output 1100 ml  Net 2457.24 ml   Filed Weights   10/05/21 1716 10/05/21 2154 10/07/21 1142  Weight: 88 kg 87.8 kg 86.3 kg     Examination:  General: Uncomfortable, somewhat agitated, intubated HENT: Unchanged R eye bruising/swelling, scalp incision site clean and dry PULM: Clear bilaterally CV: Regular, borderline tachycardic GI: Nondistended, positive bowel sounds MSK: No deformities Neuro: Moving all extremities, appears to be purposeful.  Does not open eyes (quite swollen).  Not following commands.  Propofol just turned off   Resolved Hospital Problem list     Assessment & Plan:  Left subacute subdural hematoma with brain compression.  Craniotomy on 8/8, then redo on 8/10 -Continue Keppra -Goal SBP less than 140, Cleviprex if needed.  Scheduled labetalol ordered -Postop care as per neurosurgery plans  Remains critically ill due to acute metabolic encephalopathy due to uremia, narcotics, untreated OSA with brain compression from SDH -Minimize narcotics -Subsequent CT, imaging as per Dr. Hewitt Shorts plans -PD or HD depending on nephrology plans  Need for mechanical ventilation after neurosurgery.  Initially extubated 8/9, mechanically ventilated 8/10 for redo OSA> unable to use hospital masks due to craniotomy -Minimize sedation and push SBT/PSV -Hopefully will be able to extubate 8/11 if he is able to follow commands.  If agitation remains an issue then consider transition propofol to Precedex to facilitate SBT -Aspiration precautions -Will see if we we will be able to use his home CPAP device once he is extubated and stabilizing  Paroxysmal atrial fibrillation, previously on Eliquis -Telemetry monitoring -Labetalol as ordered -Eliquis is on hold  CAD Aortic stenosis, s/p TAVR -Continue telemetry monitoring -Labetalol as ordered -Torsemide as ordered  Hypertension -Continue Cleviprex  for SBP goal -Labetalol as ordered  Hyperlipidemia -Atorvastatin  ESRD on Peritoneal dialysis, uremia -Appreciate nephrology management.  PD versus HD depending on stability, degree of uremia -Follow  BMP, urine output -Replace electrolytes as indicated -Renal dose medications  Gout -Allopurinol as ordered  Best Practice (right click and "Reselect all SmartList Selections" daily)   Diet/type: NPO DVT prophylaxis: SCD GI prophylaxis: N/A Lines: N/A Foley:  Yes, and it is no longer needed > remove Code Status:  full code Last date of multidisciplinary goals of care discussion [per NSGY, wife updated bedside by [facility] on 8/10] Family: Wife updated at bedside on 8/11  Critical care time: 4 minutes     Baltazar Apo, MD, PhD 10/08/2021, 10:11 AM [facility] [phone] or if no answer before 7:00PM call [phone] For any issues after 7:00PM please call eLink [phone]

## 2021-10-08 NOTE — Progress Notes (Signed)
RT note: CPAP held for now due to recent craniotomy. Pt resting on Daniel Solomon at this time. No distress noted

## 2021-10-08 NOTE — Procedures (Signed)
Cortrak  Person Inserting Tube:  Maylon Peppers C, RD Tube Type:  Cortrak - 43 inches Tube Size:  10 Tube Location:  Left nare Secured by: Bridle Technique Used to Measure Tube Placement:  Marking at nare/corner of mouth Cortrak Secured At:  72 cm   Cortrak Tube Team Note:  Consult received to place a Cortrak feeding tube.   X-ray is required, abdominal x-ray has been ordered by the Cortrak team. Please confirm tube placement before using the Cortrak tube.   If the tube becomes dislodged please keep the tube and contact the Cortrak team at [email or web address] (password [email or web address]) for replacement.  If after hours and replacement cannot be delayed, place a NG tube and confirm placement with an abdominal x-ray.    Lockie Pares., RD, LDN, CNSC See AMiON for contact information

## 2021-10-08 NOTE — Progress Notes (Signed)
Initial Nutrition Assessment  DOCUMENTATION CODES:   Not applicable  INTERVENTION:   Initiate tube feeding via Cortrak tube: Osmolite 1.5 at 65 ml/h (1560 ml per day) Prosource TF20 60 ml BID  Provides 2420 kcal, 137 gm protein, 1185 ml free water daily   NUTRITION DIAGNOSIS:   Increased nutrient needs related to post-op healing as evidenced by estimated needs.  GOAL:   Patient will meet greater than or equal to 90% of their needs  MONITOR:   TF tolerance  REASON FOR ASSESSMENT:   Consult Enteral/tube feeding initiation and management  ASSESSMENT:   Pt with PMH of ESRD on PD, CAD, HLD, Afib, SDH, HTN, and OSA admitted with known subacute chronic SDH s/p middle meningeal artery embolism 8/1 admitted 8/8 for enlarging SDH.   8/8 s/p crani 8/9 extubated 8/10 s/p redo crani due to 15 mm midline shift  8/11 extubated  Wife at bedside. Reports that pt has been eating some less due to chronic headaches.   Medications reviewed and include: protonix, senokot   Labs reviewed: PO4 9.2 TG: 213   UOP: 700 ml    NUTRITION - FOCUSED PHYSICAL EXAM:  Flowsheet Row Most Recent Value  Orbital Region Unable to assess  Upper Arm Region No depletion  Thoracic and Lumbar Region No depletion  Buccal Region No depletion  Temple Region No depletion  Clavicle Bone Region No depletion  Clavicle and Acromion Bone Region No depletion  Scapular Bone Region No depletion  Dorsal Hand No depletion  Patellar Region No depletion  Anterior Thigh Region No depletion  Posterior Calf Region No depletion  Edema (RD Assessment) None  Hair Unable to assess  Eyes Unable to assess  Mouth Reviewed  Skin Reviewed  Nails Reviewed       Diet Order:   Diet Order             Diet NPO time specified  Diet effective now                   EDUCATION NEEDS:   No education needs have been identified at this time  Skin:  Skin Assessment: Reviewed RN Assessment  Last BM:   unknown  Height:   Ht Readings from Last 1 Encounters:  10/05/21 '5\' 8"'$  (1.727 m)    Weight:   Wt Readings from Last 1 Encounters:  10/08/21 87.2 kg    BMI:  Body mass index is 29.23 kg/m.  Estimated Nutritional Needs:   Kcal:  2300-2500  Protein:  120-140 grams  Fluid:  >1.2 L/day  Lockie Pares., RD, LDN, CNSC See AMiON for contact information

## 2021-10-08 NOTE — Progress Notes (Signed)
PT Cancellation Note  Patient Details Name: Daniel Solomon MRN: [id number] DOB: Oct 11, 1960   Cancelled Treatment:    Reason Eval/Treat Not Completed: Other (comment). RN requesting hold until potential extubation this afternoon. Will plan to follow-up as time permits.   Moishe Spice, PT, DPT Acute Rehabilitation Services  Office: [place] 10/08/2021, 11:01 AM

## 2021-10-08 NOTE — Progress Notes (Signed)
PCCM Interval Note  Assesses pt on SBT, sedation off.  Improved MS and tolerating PSV Strong cough w suctioning.   Plan to extubate now and follow closely.    Baltazar Apo, MD, PhD 10/08/2021, 12:25 PM [facility] [phone] or if no answer before 7:00PM call [phone] For any issues after 7:00PM please call eLink [phone]

## 2021-10-08 NOTE — Progress Notes (Signed)
[facility] Progress Note Patient Name: Daniel Solomon DOB: Mar 03, 1960 MRN: [id number]   Date of Service  10/08/2021  HPI/Events of Note  Patient is on a Propofol IV infusion for sedation while intubated and mechanically ventilated. No orders for Propofol IV infusion.   eICU Interventions  Plan: Propofol IV infusion. Titrate to RASS = 0 to -1.      Intervention Category Major Interventions: Other:  Lysle Dingwall 10/08/2021, 1:31 AM

## 2021-10-08 NOTE — Anesthesia Postprocedure Evaluation (Signed)
Anesthesia Post Note  Patient: Daniel Solomon  Procedure(s) Performed: Craniectomy for Recurrent  Intracranial Hematoma (Left: Head)     Patient location during evaluation: SICU Anesthesia Type: General Level of consciousness: sedated Pain management: pain level controlled Vital Signs Assessment: post-procedure vital signs reviewed and stable Respiratory status: patient remains intubated per anesthesia plan Cardiovascular status: stable Postop Assessment: no apparent nausea or vomiting Anesthetic complications: no   No notable events documented.              Effie Berkshire

## 2021-10-08 NOTE — Progress Notes (Signed)
Daniel Solomon Progress Note  Subjective: pt intubated post op after going back to OR from removal of hematoma. Came off PD this am about 10. Sedated now  Vitals:   10/08/21 1345 10/08/21 1400 10/08/21 1415 10/08/21 1500  BP:      Pulse: 83 87 77   Resp:      Temp:      TempSrc:      SpO2: 99% 99% 100%   Weight:    87.2 kg  Height:        Exam:  on vent ,sedated  no jvd  throat ett in place  Chest cta bilat and lat  Cor reg no RG  Abd soft ntnd no ascites   Ext no LE edema   Neuro on vent and sedated, not following commands   RLQ PD cath intact, clear exit site    Home meds include - allopurinol, atorvastatin, auryxia 3 ac tid, calcitriol 0.25 qd, docusate, hydrocodone-aceta prn, labetalol 100 bid, levetiracetam, torsemide 100 qd, prns/ vits / supps        OP PD: CCPD [facility] 7d /wk 93kg  5 overnight, 3000 fill, last fill 2500 for daybag - last Hb 11.6 on 7/7, tsat 14%, ferr 754 - no esa or IV fe currently     Assessment/ Plan: SDH w/ brain compression - sp SDH evac and craniotomy 8/8. Went back to OR last night for hematoma evacuation.   AMS - doubt uremia, his baseline creat is 12- 14, and creat today is 16.  PAF - holding eliquis for now, per cards/ NSGY ESRD - on CCPD. Continue nightly CCPD.  CAD hx PCI / sp TAVR HTN - on home labetalol 100 bid and torsemide '100mg'$  qd. IV cleviprex per pmd. SBP goal < 140 for now.  Volume - euvolemic on exam, under dry wt. Cont to use lowest UF fluids (1.5%) w/ PD.  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 Transfuse prn.  MBD ckd - CCa in range, phos is high. Cont daily vdra, and cont auryxia as binder.       Daniel Solomon 10/08/2021, 3:30 PM   Recent Labs  Lab 10/06/21 1042 10/07/21 0249 10/07/21 1136 10/07/21 2004 10/07/21 2137 10/08/21 0202  HGB  --  8.1*  --    < > 8.2* 9.3*  ALBUMIN 3.2* 2.8*  --   --   --   --   CALCIUM  --  9.3 9.5  --   --  8.6*  PHOS 9.2*  --   --   --   --   --   CREATININE   --  18.03* 17.36*  --   --  16.34*  K  --  4.6 4.7   < > 5.4* 4.5   < > = values in this interval not displayed.    Recent Labs  Lab 10/06/21 1042  IRON 79  TIBC 150*    Inpatient medications:  sodium chloride   Intravenous Once   allopurinol  100 mg Per Tube Daily   atorvastatin  40 mg Per Tube Daily   calcitRIOL  0.25 mcg Per Tube Daily   Chlorhexidine Gluconate Cloth  6 each Topical Daily   ferric citrate  630 mg Oral TID WC   gentamicin cream  1 Application Topical Daily   labetalol  100 mg Per Tube BID   levETIRAcetam  500 mg Per Tube BID   pantoprazole  40 mg Per Tube Daily  sennosides  5 mL Per Tube BID   torsemide  100 mg Per Tube Daily    sodium chloride 10 mL/hr at 10/05/21 1718   clevidipine Stopped (10/08/21 1431)   dialysis solution 1.5% low-MG/low-CA     phenylephrine (NEO-SYNEPHRINE) Adult infusion     acetaminophen **OR** acetaminophen, bisacodyl, HYDROcodone-acetaminophen, labetalol, naLOXone (NARCAN)  injection, ondansetron **OR** ondansetron (ZOFRAN) IV

## 2021-10-08 NOTE — Progress Notes (Signed)
PD tx initation note: s/p craniotomy  Pre TX VS: 97.6-148/88-73-16-100% ventilator  PD treatment initiated via aseptic technique. Consent signed and in chart. Patient was sedated and intubated, wife at bedside. PD catheter was unclamped but transfer set was closed and capped. PD exit site clean, dry and intact. Gentamycin and new dressing applied. Report given to bedside RN.

## 2021-10-08 NOTE — Progress Notes (Signed)
SLP Cancellation Note  Patient Details Name: Daniel Solomon MRN: [id number] DOB: [birth year]   Cancelled treatment:       Reason Eval/Treat Not Completed: Patient not medically ready for cognitive eval, planning extubation later today per chart   Genevieve Arbaugh, Katherene Ponto 10/08/2021, 11:56 AM

## 2021-10-08 NOTE — Procedures (Signed)
Extubation Procedure Note  Patient Details:   Name: Daniel Solomon DOB: 11/13/1960 MRN: [id number]   Airway Documentation:    Vent end date: 10/08/21 Vent end time: 1227   Evaluation  O2 sats: stable throughout Complications: No apparent complications Patient did tolerate procedure well. Bilateral Breath Sounds: Clear, Diminished   Yes  Pt extubated to [place] with RN and wife at bedside. Positive cuff leak noted and pt is tolerating well. RT will monitor.  Cathie Olden 10/08/2021, 12:27 PM

## 2021-10-08 NOTE — Evaluation (Signed)
Physical Therapy Re-Evaluation Patient Details Name: Daniel Solomon MRN: [id number] DOB: 06-Jun-1960 Today's Date: 10/08/2021  History of Present Illness  Pt is a 61 y.o. male who presented 10/05/21 with repeat head CT showing an increasing amount of subdural blood and L to R shift. 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 S/p craniotomy hematoma evacuation subdural 8/8. S/p craniectomy for Recurrent Intracranial Hematoma 8/10.PMH: anemia, bicuspid aortic valve, CAD, enlarged aorta, ESRD on peritoneal dialysis, glaucoma, gout, hypercholesterolemia, HTN, mild aortic stenosis, PAF, s/p TAVR, SDH, sleep apnea.   Clinical Impression  Pt now s/p craniectomy 8/10 for recurrent intracranial hematoma and demonstrating a functional decline. Pt is limited today by a lack of vision due to his bil eyes being swollen shut, thereby relying on increased cues and assistance to guide him with all mobility though. He continues to display R-sided weakness compared to his L, but is needing increased cuing for weight shifting to advance his legs when taking a few steps to transfer to recliner with UE support and modAx2 today. Continuing to recommend AIR. Will continue to follow acutely.       Recommendations for follow up therapy are one component of a multi-disciplinary discharge planning process, led by the attending physician.  Recommendations may be updated based on patient status, additional functional criteria and insurance authorization.  Follow Up Recommendations Acute inpatient rehab (3hours/day)      Assistance Recommended at Discharge Frequent or constant Supervision/Assistance  Patient can return home with the following  A lot of help with walking and/or transfers;A lot of help with bathing/dressing/bathroom;Assistance with cooking/housework;Direct supervision/assist for medications management;Direct supervision/assist for financial management;Assist for transportation;Help  with stairs or ramp for entrance    Equipment Recommendations Rolling walker (2 wheels);BSC/3in1  Recommendations for Other Services  Rehab consult;Speech consult    Functional Status Assessment Patient has had a recent decline in their functional status and demonstrates the ability to make significant improvements in function in a reasonable and predictable amount of time.     Precautions / Restrictions Precautions Precautions: Fall Precaution Comments: No flap Restrictions Weight Bearing Restrictions: No      Mobility  Bed Mobility Overal bed mobility: Needs Assistance Bed Mobility: Supine to Sit     Supine to sit: Min assist, HOB elevated     General bed mobility comments: MinA to guide legs and ascend trunk to sit L EOB, HOB elevated.    Transfers Overall transfer level: Needs assistance Equipment used: 2 person hand held assist Transfers: Sit to/from Stand, Bed to chair/wheelchair/BSC Sit to Stand: Min assist, +2 safety/equipment   Step pivot transfers: Mod assist, +2 physical assistance, +2 safety/equipment       General transfer comment: Pt needed consistent VC, tactile cues, and weight shift A to move legs to turn from bed to recliner and to side step along bed, modAx2. MinA to power up to stand and steady.    Ambulation/Gait Ambulation/Gait assistance: Mod assist, +2 physical assistance, +2 safety/equipment Gait Distance (Feet): 3 Feet (x2 bouts of ~2-3 ft each) Assistive device: 2 person hand held assist Gait Pattern/deviations: Step-to pattern, Decreased step length - right, Decreased step length - left, Decreased stride length, Decreased weight shift to right, Decreased weight shift to left, Trunk flexed Gait velocity: reduced Gait velocity interpretation: <1.31 ft/sec, indicative of household ambulator   General Gait Details: Pt with slow, steps and needing continual verbal and tactile cues for weight shifting and to advance legs to step along EOB  and  to recliner with bil HHA for guidance due to eyes swollen shut.  Stairs            Wheelchair Mobility    Modified Rankin (Stroke Patients Only) Modified Rankin (Stroke Patients Only) Pre-Morbid Rankin Score: No symptoms Modified Rankin: Moderately severe disability     Balance Overall balance assessment: Needs assistance Sitting-balance support: Single extremity supported, Feet supported Sitting balance-Leahy Scale: Fair Sitting balance - Comments: Initially upon sitting up on EOB pt with right lateral lean needing verbal and tactile cues to correct but eventually pt self corrected and maintained.   Standing balance support: Bilateral upper extremity supported Standing balance-Leahy Scale: Poor Standing balance comment: Reliant on UE support and assistance                             Pertinent Vitals/Pain Pain Assessment Pain Assessment: No/denies pain    Home Living Family/patient expects to be discharged to:: Private residence Living Arrangements: Spouse/significant other Available Help at Discharge: Family;Available 24 hours/day Type of Home: House Home Access: Stairs to enter Entrance Stairs-Rails: None Entrance Stairs-Number of Steps: 2-5 (2 in garage, 5 in front)   Home Layout: Two level;Able to live on main level with bedroom/bathroom Home Equipment: None Additional Comments: wife can work from home, mother lives next door    Prior Function Prior Level of Function : Independent/Modified Independent;Driving;Working/employed             Mobility Comments: No AD. 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    Extremity/Trunk Assessment   Upper Extremity Assessment Upper Extremity Assessment: Defer to OT evaluation RUE Deficits / Details: Increased tone throughout, can raise arm to touch top of head, no isolated finger movement noted, edematous RUE Coordination: decreased fine motor LUE  Coordination: WNL    Lower Extremity Assessment Lower Extremity Assessment: RLE deficits/detail RLE Deficits / Details: noted functional weakness but able to lift against gravity, difficult to formally assess due to cognition    Cervical / Trunk Assessment Cervical / Trunk Assessment: Kyphotic  Communication   Communication: Receptive difficulties;Expressive difficulties (shaking his head no when we ask him to use his voice to answer questions)  Cognition Arousal/Alertness: Awake/alert (but eyes shut) Behavior During Therapy: Flat affect Overall Cognitive Status: Impaired/Different from baseline Area of Impairment: Following commands, Safety/judgement, Awareness, Problem solving                       Following Commands: Follows one step commands inconsistently, Follows one step commands with increased time Safety/Judgement: Decreased awareness of safety, Decreased awareness of deficits Awareness: Intellectual Problem Solving: Slow processing, Difficulty sequencing, Requires verbal cues, Requires tactile cues General Comments: Pt with eyes swollen shut, following commands sometimes with increased time. No verbalizations        General Comments General comments (skin integrity, edema, etc.): VSS on RA    Exercises     Assessment/Plan    PT Assessment Patient needs continued PT services  PT Problem List Decreased strength;Decreased activity tolerance;Decreased balance;Decreased mobility;Decreased cognition;Decreased coordination;Decreased knowledge of use of DME;Decreased safety awareness;Impaired sensation       PT Treatment Interventions DME instruction;Gait training;Stair training;Therapeutic activities;Functional mobility training;Therapeutic exercise;Balance training;Neuromuscular re-education;Cognitive remediation;Patient/family education    PT Goals (Current goals can be found in the Care Plan section)  Acute Rehab PT Goals Patient Stated Goal: pt did not  state;  wife wants pt to improve PT Goal Formulation: With patient/family Time For Goal Achievement: 10/21/21 Potential to Achieve Goals: Good    Frequency Min 4X/week     Co-evaluation PT/OT/SLP Co-Evaluation/Treatment: Yes Reason for Co-Treatment: Necessary to address cognition/behavior during functional activity;For patient/therapist safety;To address functional/ADL transfers PT goals addressed during session: Mobility/safety with mobility;Balance OT goals addressed during session: Strengthening/ROM;ADL's and self-care       AM-PAC PT "6 Clicks" Mobility  Outcome Measure Help needed turning from your back to your side while in a flat bed without using bedrails?: A Little Help needed moving from lying on your back to sitting on the side of a flat bed without using bedrails?: A Little Help needed moving to and from a bed to a chair (including a wheelchair)?: Total Help needed standing up from a chair using your arms (e.g., wheelchair or bedside chair)?: A Little Help needed to walk in hospital room?: Total Help needed climbing 3-5 steps with a railing? : Total 6 Click Score: 12    End of Session Equipment Utilized During Treatment: Gait belt Activity Tolerance: Patient tolerated treatment well Patient left: in chair;with call bell/phone within reach;with chair alarm set;with family/visitor present Nurse Communication: Mobility status;Other (comment) (sats) PT Visit Diagnosis: Unsteadiness on feet (R26.81);Other abnormalities of gait and mobility (R26.89);Muscle weakness (generalized) (M62.81);Difficulty in walking, not elsewhere classified (R26.2);Other symptoms and signs involving the nervous system (R29.898);Hemiplegia and hemiparesis Hemiplegia - Right/Left: Right Hemiplegia - dominant/non-dominant: Dominant Hemiplegia - caused by: Nontraumatic intracerebral hemorrhage    Time: 0865-7846 PT Time Calculation (min) (ACUTE ONLY): 29 min   Charges:   PT Evaluation $PT  Re-evaluation: 1 Re-eval          Moishe Spice, PT, DPT Acute Rehabilitation Services  Office: [phone]   Orvan Falconer 10/08/2021, 4:49 PM

## 2021-10-08 NOTE — Progress Notes (Signed)
Pt placed on PS/CPAP 8/5 on 40% and is tolerating well. RT will monitor. 

## 2021-10-08 NOTE — Evaluation (Signed)
Occupational Therapy Evaluation Patient Details Name: Daniel Solomon MRN: [id number] DOB: 1960-10-29 Today's Date: 10/08/2021   History of Present Illness Pt is a 61 y.o. male who presented 10/05/21 with repeat head CT showing an increasing amount of subdural blood and L to R shift. 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 S/p craniotomy hematoma evacuation subdural 8/8.8/10 craniectomy for Recurrent Intracranial Hematoma.PMH: anemia, bicuspid aortic valve, CAD, enlarged aorta, ESRD on peritoneal dialysis, glaucoma, gout, hypercholesterolemia, HTN, mild aortic stenosis, PAF, s/p TAVR, SDH, sleep apnea.   Clinical Impression   This 61 yo male admitted and underwent above presents to acute OT with PLOF before 09/28/21 of being totally independent with basic ADLs, IADLs, driving, and working as a Dance movement psychotherapist. He currently is Mod-total A for a basic ADLs and min A-mod A +2 for mobility. He has decreased use of RUE with increased tone and currently eyes are swollen shut. He will continue to benefit from acute OT with follow up on AIR.      Recommendations for follow up therapy are one component of a multi-disciplinary discharge planning process, led by the attending physician.  Recommendations may be updated based on patient status, additional functional criteria and insurance authorization.   Follow Up Recommendations  Acute inpatient rehab (3hours/day)    Assistance Recommended at Discharge Frequent or constant Supervision/Assistance  Patient can return home with the following A lot of help with walking and/or transfers;A lot of help with bathing/dressing/bathroom;Assistance with cooking/housework;Assistance with feeding;Help with stairs or ramp for entrance;Assist for transportation;Direct supervision/assist for financial management;Direct supervision/assist for medications management    Functional Status Assessment  Patient has had a recent decline in their  functional status and demonstrates the ability to make significant improvements in function in a reasonable and predictable amount of time.  Equipment Recommendations  Other (comment) (TBD next venue)    Recommendations for Other Services Rehab consult     Precautions / Restrictions Precautions Precautions: Fall Precaution Comments: No flap Restrictions Weight Bearing Restrictions: No      Mobility Bed Mobility Overal bed mobility: Needs Assistance Bed Mobility: Supine to Sit     Supine to sit: Min assist, HOB elevated          Transfers Overall transfer level: Needs assistance Equipment used: 2 person hand held assist Transfers: Sit to/from Stand, Bed to chair/wheelchair/BSC Sit to Stand: Min assist, +2 safety/equipment Stand pivot transfers: Mod assist, +2 physical assistance         General transfer comment: Pt needed consistent VC, tactile cues, and weight shift A to move legs to turn from bed to recliner and to side step along bed.      Balance Overall balance assessment: Needs assistance Sitting-balance support: Single extremity supported, Feet supported Sitting balance-Leahy Scale: Fair Sitting balance - Comments: Initially upon sitting up on EOB pt with right lateral lean needing verbal and tactile cues to correct but eventually pt self corrected and maintained.   Standing balance support: Bilateral upper extremity supported (HHA) Standing balance-Leahy Scale: Poor                             ADL either performed or assessed with clinical judgement   ADL Overall ADL's : Needs assistance/impaired Eating/Feeding: NPO   Grooming: Moderate assistance;Sitting   Upper Body Bathing: Maximal assistance;Sitting   Lower Body Bathing: Maximal assistance Lower Body Bathing Details (indicate cue type and reason): min A +2  sit<>stand for safety Upper Body Dressing : Maximal assistance;Sitting   Lower Body Dressing: Total assistance Lower Body  Dressing Details (indicate cue type and reason): min A +2 sit<>stand for safety Toilet Transfer: Moderate assistance;Stand-pivot Toilet Transfer Details (indicate cue type and reason): Bil HHA, bed>recliner going to his right (VCs for sequencing and tactile cues for sequencing as well) Toileting- Clothing Manipulation and Hygiene: Total assistance Toileting - Clothing Manipulation Details (indicate cue type and reason): min A +2 sit<>stand for safety             Vision   Additional Comments: eyes swollen shut            Pertinent Vitals/Pain Pain Assessment Pain Assessment: No/denies pain     Hand Dominance Right   Extremity/Trunk Assessment Upper Extremity Assessment Upper Extremity Assessment: RUE deficits/detail;LUE deficits/detail RUE Deficits / Details: Increased tone throughout, can raise arm to touch top of head, no isolated finger movement noted, edematous RUE Coordination: decreased fine motor LUE Coordination: WNL           Communication Communication Communication: Receptive difficulties;Expressive difficulties (shaking his head no when we ask him to use his voice to answer questions)   Cognition Arousal/Alertness: Awake/alert (but eyes shut) Behavior During Therapy: Flat affect Overall Cognitive Status: Impaired/Different from baseline Area of Impairment: Following commands, Safety/judgement, Awareness, Problem solving                       Following Commands: Follows one step commands inconsistently, Follows one step commands with increased time Safety/Judgement: Decreased awareness of safety, Decreased awareness of deficits Awareness: Intellectual Problem Solving: Slow processing, Difficulty sequencing, Requires verbal cues, Requires tactile cues General Comments: Pt with eyes swollen shut, following commands sometimes with increased time. No verbalizations. He would not lay back down at end of session--RN agreed to let us get him up to recliner  with alarm pad.     General Comments  VSS on RA            Home Living Family/patient expects to be discharged to:: Private residence Living Arrangements: Spouse/significant other Available Help at Discharge: Family;Available 24 hours/day Type of Home: House Home Access: Stairs to enter [employer] of Steps: 2-5 (2 in garage, 5 in front)   Home Layout: Two level;Able to live on main level with bedroom/bathroom     Bathroom Shower/Tub: Occupational psychologist: Standard Bathroom Accessibility: Yes   Home Equipment: None   Additional Comments: wife can work from home, mother lives next door      Prior Functioning/Environment Prior Level of Function : Independent/Modified Independent;Driving;Working/employed             Mobility Comments: No AD. 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 Problem List: Decreased strength;Decreased range of motion;Impaired balance (sitting and/or standing);Impaired vision/perception;Decreased coordination;Decreased cognition;Decreased safety awareness;Impaired UE functional use;Impaired tone;Increased edema      OT Treatment/Interventions: Self-care/ADL training;DME and/or AE instruction;Patient/family education;Balance training;Visual/perceptual remediation/compensation;Therapeutic activities;Therapeutic exercise    OT Goals(Current goals can be found in the care plan section) Acute Rehab OT Goals Patient Stated Goal: Did not state OT Goal Formulation: With patient/family Time For Goal Achievement: 10/22/21 Potential to Achieve Goals: Good  OT Frequency: Min 2X/week    Co-evaluation PT/OT/SLP Co-Evaluation/Treatment: Yes Reason for Co-Treatment: For patient/therapist safety;To address functional/ADL transfers PT goals addressed during session: Mobility/safety with mobility;Balance;Strengthening/ROM OT goals addressed during session: Strengthening/ROM;ADL's and self-care  AM-PAC OT "6  Clicks" Daily Activity     Outcome Measure Help from another person eating meals?: Total Help from another person taking care of personal grooming?: A Lot Help from another person toileting, which includes using toliet, bedpan, or urinal?: A Lot Help from another person bathing (including washing, rinsing, drying)?: A Lot Help from another person to put on and taking off regular upper body clothing?: A Lot Help from another person to put on and taking off regular lower body clothing?: Total 6 Click Score: 10   End of Session Equipment Utilized During Treatment: Gait belt Nurse Communication: Mobility status  Activity Tolerance: Patient tolerated treatment well Patient left: in chair;with call bell/phone within reach  OT Visit Diagnosis: Unsteadiness on feet (R26.81);Other abnormalities of gait and mobility (R26.89);Muscle weakness (generalized) (M62.81);Other symptoms and signs involving cognitive function;Low vision, both eyes (H54.2);Hemiplegia and hemiparesis Hemiplegia - Right/Left: Right Hemiplegia - dominant/non-dominant: Dominant Hemiplegia - caused by:  (chronic SDH)                Time: 7893-8101 OT Time Calculation (min): 31 min Charges:  OT General Charges $OT Visit: 1 Visit OT Evaluation $OT Eval Moderate Complexity: [place], OTR/L [facility] Re[facility] [phone] Office [phone]    Almon Register 10/08/2021, 4:41 PM

## 2021-10-08 NOTE — Plan of Care (Signed)
  Problem: Skin Integrity: Goal: Demonstration of wound healing without infection will improve Outcome: Progressing   Problem: Activity: Goal: Ability to tolerate increased activity will improve Outcome: Progressing   Problem: Respiratory: Goal: Ability to maintain a clear airway and adequate ventilation will improve Outcome: Progressing   Problem: Safety: Goal: Non-violent Restraint(s) Outcome: Completed/Met

## 2021-10-09 DIAGNOSIS — S065XAA Traumatic subdural hemorrhage with loss of consciousness status unknown, initial encounter: Secondary | ICD-10-CM | POA: Diagnosis not present

## 2021-10-09 DIAGNOSIS — Z9889 Other specified postprocedural states: Secondary | ICD-10-CM | POA: Diagnosis not present

## 2021-10-09 LAB — CBC
HCT: 26.1 % — ABNORMAL LOW (ref 39.0–52.0)
Hemoglobin: 8.6 g/dL — ABNORMAL LOW (ref 13.0–17.0)
MCH: 33.1 pg (ref 26.0–34.0)
MCHC: 33 g/dL (ref 30.0–36.0)
MCV: 100.4 fL — ABNORMAL HIGH (ref 80.0–100.0)
Platelets: 86 10*3/uL — ABNORMAL LOW (ref 150–400)
RBC: 2.6 MIL/uL — ABNORMAL LOW (ref 4.22–5.81)
RDW: 16.9 % — ABNORMAL HIGH (ref 11.5–15.5)
WBC: 9.2 10*3/uL (ref 4.0–10.5)
nRBC: 0 % (ref 0.0–0.2)

## 2021-10-09 LAB — BASIC METABOLIC PANEL
Anion gap: 16 — ABNORMAL HIGH (ref 5–15)
BUN: 86 mg/dL — ABNORMAL HIGH (ref 6–20)
CO2: 21 mmol/L — ABNORMAL LOW (ref 22–32)
Calcium: 8.8 mg/dL — ABNORMAL LOW (ref 8.9–10.3)
Chloride: 103 mmol/L (ref 98–111)
Creatinine, Ser: 15.22 mg/dL — ABNORMAL HIGH (ref 0.61–1.24)
GFR, Estimated: 3 mL/min — ABNORMAL LOW (ref >60)
Glucose, Bld: 119 mg/dL — ABNORMAL HIGH (ref 70–99)
Potassium: 4.4 mmol/L (ref 3.5–5.1)
Sodium: 140 mmol/L (ref 135–145)

## 2021-10-09 LAB — GLUCOSE, CAPILLARY
Glucose-Capillary: 136 mg/dL — ABNORMAL HIGH (ref 70–99)
Glucose-Capillary: 154 mg/dL — ABNORMAL HIGH (ref 70–99)
Glucose-Capillary: 135 mg/dL — ABNORMAL HIGH (ref 70–99)
Glucose-Capillary: 150 mg/dL — ABNORMAL HIGH (ref 70–99)
Glucose-Capillary: 142 mg/dL — ABNORMAL HIGH (ref 70–99)
Glucose-Capillary: 122 mg/dL — ABNORMAL HIGH (ref 70–99)

## 2021-10-09 LAB — PHOSPHORUS: Phosphorus: 7.6 mg/dL — ABNORMAL HIGH (ref 2.5–4.6)

## 2021-10-09 LAB — MAGNESIUM: Magnesium: 2.3 mg/dL (ref 1.7–2.4)

## 2021-10-09 MED ORDER — DELFLEX-LC/1.5% DEXTROSE 344 MOSM/L IP SOLN: INTRAPERITONEAL | Status: DC

## 2021-10-09 MED ORDER — HEPARIN 1000 UNIT/ML FOR PERITONEAL DIALYSIS: INTRAPERITONEAL | Status: DC | PRN

## 2021-10-09 NOTE — Progress Notes (Signed)
NAME:  Daniel Solomon, MRN:  [id number], DOB:  1960-05-28, LOS: 4 ADMISSION DATE:  10/05/2021, CONSULTATION DATE:  8/8 REFERRING MD:  Christella Noa, CHIEF COMPLAINT:  post procedure ventilator management   History of Present Illness:  61 y/o male with multiple medical problems underwent an emergent craniotomy and hematoma evacuation on 8/8 in the setting of new onset aphasia and weakness due to an enlarging subdural hematoma.  He underwent an elective middle meningeal artery embolism on 8/1.  Craniotomy with hematoma evacuation on 8/8, then extubated on 8/9.  Pertinent  Medical History  ESRD CAD Hyperlipidemia Aortic stenosis Paroxysmal atrial fibrillation SDH Hypertension OSA Gout  Significant Hospital Events: Including procedures, antibiotic start and stop dates in addition to other pertinent events   8/8 admission, underwent craniotomy with hematoma evacuation, sent to ICU on vent 8/9 extubated 8/10 back to the OR for redo craniotomy given increased shift noted on head CT Extubated 8/11  Interim History / Subjective:  Extubated successfully 8/11 Has continued his nocturnal peritoneal dialysis Worked with PT and OT yesterday CPAP deferred overnight given his craniotomy No infusions  Objective   Blood pressure (!) 138/95, pulse 81, temperature 99.1 F (37.3 C), temperature source Axillary, resp. rate 13, height '5\' 8"'$  (1.727 m), weight 87.2 kg, SpO2 99 %.        Intake/Output Summary (Last 24 hours) at 10/09/2021 0921 Last data filed at 10/09/2021 0700 Gross per 24 hour  Intake 994.7 ml  Output 255 ml  Net 739.7 ml   Filed Weights   10/05/21 2154 10/07/21 1142 10/08/21 1500  Weight: 87.8 kg 86.3 kg 87.2 kg    Examination:  General: Up to chair.  Ill-appearing.  Completing PD now HENT: Right eye bruising, scalp swelling incision clean PULM: Clear bilaterally CV: Regular, no murmur, trace pretibial edema GI: Nondistended, positive bowel sounds MSK: No deformity Neuro:  Moves extremities.  Will nod to questions and answer simple questions yes/no.  Eyes are swollen, pupils not examined   Resolved Hospital Problem list     Assessment & Plan:  Left subacute subdural hematoma with brain compression.  Craniotomy on 8/8, then redo on 8/10 -Continue Keppra -Goal SBP <140.  Cleviprex available if needed.  On scheduled labetalol  Remains critically ill due to acute metabolic encephalopathy due to uremia, narcotics, untreated OSA with brain compression from SDH -Minimize narcotics and sedating medications -Any subsequent imaging as per Dr. Lacy Duverney plans -Currently on PD.  No clear indication to initiate HD based on mental status/uremia  Need for mechanical ventilation after neurosurgery.  Initially extubated 8/9, mechanically ventilated 8/10 for redo OSA> unable to use hospital masks due to craniotomy -Extubated successfully 8/11 -Push pulmonary hygiene -Aspirations precautions -Restart CPAP when safe to do so post craniotomy  Paroxysmal atrial fibrillation, previously on Eliquis -Telemetry monitoring -Labetalol as ordered -Eliquis is currently on hold   CAD Aortic stenosis, s/p TAVR -Continue telemetry monitoring -Labetalol as ordered -Torsemide as ordered  Hypertension -Labetalol as ordered  Hyperlipidemia -Atorvastatin  ESRD on Peritoneal dialysis, uremia -Appreciate nephrology management -Plan to continue nocturnal PD -Follow BMP and urine output -Replace electrolytes as indicated -Renal dose medications  Gout -Allopurinol as ordered  Best Practice (right click and "Reselect all SmartList Selections" daily)   Diet/type: NPO DVT prophylaxis: SCD GI prophylaxis: N/A Lines: N/A Foley:  Yes, and it is no longer needed > remove Code Status:  full code Last date of multidisciplinary goals of care discussion [per NSGY, wife updated bedside by [facility] on  8/10] Family: Wife updated at bedside on 8/11  Critical care time: NA      Baltazar Apo, MD, PhD 10/09/2021, 9:21 AM [facility] [phone] or if no answer before 7:00PM call [phone] For any issues after 7:00PM please call eLink [phone]

## 2021-10-09 NOTE — Progress Notes (Signed)
Inpatient Rehab Admissions:  Inpatient Rehab Consult received.  I met with patient and pt's wife Earlie Server at the bedside for rehabilitation assessment and to discuss goals and expectations of an inpatient rehab admission.  Pt was asleep so spoke with wife. Discussed average length of stay at CIR. Discussed 24/7 support after discharge requirement. Earlie Server confirmed that she will be able to provide 24/7 support for pt after discharge. Also discussed pre-authorization requirement for pt's insurance. Earlie Server acknowledged understanding. Will continue to follow.  Signed: Gayland Curry, [place], [place] Admissions Coordinator [phone]

## 2021-10-09 NOTE — Progress Notes (Signed)
Physical Therapy Treatment Patient Details Name: Daniel Solomon MRN: [id number] DOB: Jan 27, 1961 Today's Date: 10/09/2021   History of Present Illness Pt is a 61 y.o. male who presented 10/05/21 with repeat head CT showing an increasing amount of subdural blood and L to R shift. 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 S/p craniotomy hematoma evacuation subdural 8/8. S/p craniectomy for Recurrent Intracranial Hematoma 8/10. PMH: anemia, bicuspid aortic valve, CAD, enlarged aorta, ESRD on peritoneal dialysis, glaucoma, gout, hypercholesterolemia, HTN, mild aortic stenosis, PAF, s/p TAVR, SDH, sleep apnea.    PT Comments    Pt is making good, steady progress with mobility. He was able to open his R eye to assist him in seeing his environment for mobility today. Pt required minA to come to stand and min-modA to ambulate up to ~50 ft x2 bouts with a RW today, demonstrating deficits in R-sided strength resulting in decreased R step length, R foot clearance, and R grip on RW. Pt requires continual cuing and directing. Will continue to follow acutely. Current recommendations remain appropriate.     Recommendations for follow up therapy are one component of a multi-disciplinary discharge planning process, led by the attending physician.  Recommendations may be updated based on patient status, additional functional criteria and insurance authorization.  Follow Up Recommendations  Acute inpatient rehab (3hours/day)     Assistance Recommended at Discharge Frequent or constant Supervision/Assistance  Patient can return home with the following A lot of help with walking and/or transfers;A lot of help with bathing/dressing/bathroom;Assistance with cooking/housework;Direct supervision/assist for medications management;Direct supervision/assist for financial management;Assist for transportation;Help with stairs or ramp for entrance   Equipment Recommendations  Rolling walker (2  wheels);BSC/3in1    Recommendations for Other Services       Precautions / Restrictions Precautions Precautions: Fall Precaution Comments: No flap Restrictions Weight Bearing Restrictions: No     Mobility  Bed Mobility               General bed mobility comments: Pt up in recliner upon arrival.    Transfers Overall transfer level: Needs assistance Equipment used: Rolling walker (2 wheels) Transfers: Sit to/from Stand Sit to Stand: Min assist           General transfer comment: MinA to steady with transfers to stand from recliner to RW, needing help to hold R hand on RW.    Ambulation/Gait Ambulation/Gait assistance: Min assist, Mod assist Gait Distance (Feet): 50 Feet (x2 bouts of ~50 ft each) Assistive device: Rolling walker (2 wheels) Gait Pattern/deviations: Decreased step length - right, Decreased dorsiflexion - right, Trunk flexed, Step-to pattern Gait velocity: reduced Gait velocity interpretation: <1.31 ft/sec, indicative of household ambulator   General Gait Details: Pt with slow gait and decreased R step length and foot clearance. Provided repeated verbal and tactile cues to correct with min success. Pt with L eye swollen shut, thereby only using R eye to look around for guidance, needing continual cues to direct pt and intermittent assistance at [facility] with turns. Pt needing repeated cues to attend to R hand and return hand to grip of RW.   Stairs             Wheelchair Mobility    Modified Rankin (Stroke Patients Only) Modified Rankin (Stroke Patients Only) Pre-Morbid Rankin Score: No symptoms Modified Rankin: Moderately severe disability     Balance Overall balance assessment: Needs assistance         Standing balance support: Bilateral upper  extremity supported, During functional activity, Reliant on assistive device for balance Standing balance-Leahy Scale: Poor Standing balance comment: Reliant on RW and external physical  assistance                            Cognition Arousal/Alertness: Awake/alert Behavior During Therapy: Flat affect (smiling intermittently) Overall Cognitive Status: Impaired/Different from baseline Area of Impairment: Attention, Memory, Following commands, Safety/judgement, Awareness, Problem solving                   Current Attention Level: Sustained Memory: Decreased short-term memory Following Commands: Follows one step commands inconsistently, Follows one step commands with increased time Safety/Judgement: Decreased awareness of safety, Decreased awareness of deficits Awareness: Intellectual Problem Solving: Slow processing, Decreased initiation, Difficulty sequencing, Requires verbal cues, Requires tactile cues General Comments: Pt much more awake and smiling intermittently today. Pt with slow processing and inconsistent in following simple cues. Pt still had R inattention, but better awareness of R side at times. Poor safety awareness, declining to sit at a point even though he appeared tired due to noted shaking. Needs cues to guide him due to L eye still swollen shut. Repeatedly stating "ok".        Exercises      General Comments General comments (skin integrity, edema, etc.): VSS on RA      Pertinent Vitals/Pain Pain Assessment Pain Assessment: Faces Faces Pain Scale: No hurt Pain Intervention(s): Monitored during session    Home Living   Living Arrangements: Spouse/significant other Available Help at Discharge: Family;Available 24 hours/day Type of Home: House Home Access: Stairs to enter Entrance Stairs-Rails: None Entrance Stairs-Number of Steps: 3   Home Layout: Able to live on main level with bedroom/bathroom        Prior Function            PT Goals (current goals can now be found in the care plan section) Acute Rehab PT Goals Patient Stated Goal: pt agreeable to session; wife reports pt has been wanting to get up and walk PT  Goal Formulation: With patient/family Time For Goal Achievement: 10/21/21 Potential to Achieve Goals: Good Progress towards PT goals: Progressing toward goals    Frequency    Min 4X/week      PT Plan Current plan remains appropriate    Co-evaluation              AM-PAC PT "6 Clicks" Mobility   Outcome Measure  Help needed turning from your back to your side while in a flat bed without using bedrails?: A Little Help needed moving from lying on your back to sitting on the side of a flat bed without using bedrails?: A Lot Help needed moving to and from a bed to a chair (including a wheelchair)?: A Lot Help needed standing up from a chair using your arms (e.g., wheelchair or bedside chair)?: A Little Help needed to walk in hospital room?: A Lot Help needed climbing 3-5 steps with a railing? : Total 6 Click Score: 13    End of Session Equipment Utilized During Treatment: Gait belt Activity Tolerance: Patient tolerated treatment well Patient left: in chair;with call bell/phone within reach;with family/visitor present Nurse Communication: Mobility status;Other (comment) (sats) PT Visit Diagnosis: Unsteadiness on feet (R26.81);Other abnormalities of gait and mobility (R26.89);Muscle weakness (generalized) (M62.81);Difficulty in walking, not elsewhere classified (R26.2);Other symptoms and signs involving the nervous system (R29.898);Hemiplegia and hemiparesis Hemiplegia - Right/Left: Right Hemiplegia - dominant/non-dominant: Dominant  Hemiplegia - caused by: Nontraumatic intracerebral hemorrhage     Time: 9191-6606 PT Time Calculation (min) (ACUTE ONLY): 31 min  Charges:  $Gait Training: 23-37 mins                     Moishe Spice, PT, DPT Acute Rehabilitation Services  Office: [place] 10/09/2021, 3:01 PM

## 2021-10-09 NOTE — Evaluation (Signed)
SLP Cancellation Note  Patient Details Name: PENN GRISSETT MRN: [id number] DOB: [birth year]   Cancelled treatment:       Reason Eval/Treat Not Completed: Other (comment) (pt sitting upright in chair with head tilted down to chest, RN reports he just had a bath and is sleepy- and did not attempt swallow with 3 ounce [place] attempt, wife agreed to defer until later, will continue efforts and try back today as schedule allows)   Macario Golds 10/09/2021, 11:55 AM   Kathleen Lime, MS [place] Office [phone] Pager [phone]

## 2021-10-09 NOTE — Progress Notes (Signed)
[facility] Progress Note  Subjective: pt intubated post op after going back to OR from removal of hematoma. Came off PD this am about 10. Sedated now  Vitals:   10/09/21 0600 10/09/21 0700 10/09/21 0800 10/09/21 0900  BP: (!) 141/78 (!) 138/95 130/80 116/79  Pulse: 68 81 78 64  Resp: '14 13 15 17  '$ Temp:   99.1 F (37.3 C)   TempSrc:   Axillary   SpO2: 97% 99% 99% 100%  Weight:      Height:        Exam:  alert, nad , taking better today  no jvd  Chest cta bilat  Cor reg no RG  Abd soft ntnd no ascites   Ext no LE edema   Alert, NF, ox3   RLQ PD cath intact, clear exit site    Home meds include - allopurinol, atorvastatin, auryxia 3 ac tid, calcitriol 0.25 qd, docusate, hydrocodone-aceta prn, labetalol 100 bid, levetiracetam, torsemide 100 qd, prns/ vits / supps        OP PD: CCPD [facility] 7d /wk 93kg  5 overnight, 3000 fill, last fill 2500 for daybag - last Hb 11.6 on 7/7, tsat 14%, ferr 754 - no esa or IV fe currently     Assessment/ Plan: SDH w/ brain compression - sp SDH evac and craniotomy 8/8. Went back to OR last night for hematoma evacuation.  Doing well today. Per NSGY.  AMS - baseline creat is 12- 14. MS is better.   PAF - holding eliquis for now, per cards/ NSGY ESRD - on CCPD. Continue nightly CCPD.  CAD hx PCI / sp TAVR HTN - on home labetalol 100 bid and torsemide '100mg'$  qd. IV cleviprex per pmd. SBP goal < 140 for now.  Volume - euvolemic on exam, under dry wt. Use all 1.5% w/ PD.  Anemia esrd - not on esa, Hb 8.5- 10 range. Will start esa if drops any further. Transfuse prn.  MBD ckd - CCa in range, phos is high. Cont daily vdra, and cont auryxia as binder.       Rob Dayshia Ballinas 10/09/2021, 11:34 AM   Recent Labs  Lab 10/06/21 1042 10/07/21 0249 10/07/21 1136 10/08/21 0202 10/09/21 0547  HGB  --  8.1*   < > 9.3* 8.6*  ALBUMIN 3.2* 2.8*  --   --   --   CALCIUM  --  9.3   < > 8.6* 8.8*  PHOS 9.2*  --   --   --  7.6*  CREATININE  --   18.03*   < > 16.34* 15.22*  K  --  4.6   < > 4.5 4.4   < > = values in this interval not displayed.    Recent Labs  Lab 10/06/21 1042  IRON 79  TIBC 150*    Inpatient medications:  sodium chloride   Intravenous Once   allopurinol  100 mg Per Tube Daily   atorvastatin  40 mg Per Tube Daily   calcitRIOL  0.25 mcg Per Tube Daily   Chlorhexidine Gluconate Cloth  6 each Topical Daily   feeding supplement (PROSource TF20)  60 mL Per Tube BID   ferric citrate  630 mg Oral TID WC   gentamicin cream  1 Application Topical Daily   labetalol  100 mg Per Tube BID   levETIRAcetam  500 mg Per Tube BID   pantoprazole  40 mg Per Tube Daily   sennosides  5 mL Per Tube BID  torsemide  100 mg Per Tube Daily    sodium chloride 10 mL/hr at 10/05/21 1718   clevidipine Stopped (10/08/21 1431)   dialysis solution 1.5% low-MG/low-CA     feeding supplement (OSMOLITE 1.5 CAL) 65 mL/hr at 10/09/21 0700   phenylephrine (NEO-SYNEPHRINE) Adult infusion     acetaminophen **OR** acetaminophen, bisacodyl, HYDROcodone-acetaminophen, labetalol, naLOXone (NARCAN)  injection, ondansetron **OR** ondansetron (ZOFRAN) IV

## 2021-10-10 DIAGNOSIS — Z9889 Other specified postprocedural states: Secondary | ICD-10-CM | POA: Diagnosis not present

## 2021-10-10 LAB — CBC
HCT: 23.2 % — ABNORMAL LOW (ref 39.0–52.0)
Hemoglobin: 8 g/dL — ABNORMAL LOW (ref 13.0–17.0)
MCH: 33.8 pg (ref 26.0–34.0)
MCHC: 34.5 g/dL (ref 30.0–36.0)
MCV: 97.9 fL (ref 80.0–100.0)
Platelets: 102 10*3/uL — ABNORMAL LOW (ref 150–400)
RBC: 2.37 MIL/uL — ABNORMAL LOW (ref 4.22–5.81)
RDW: 16.5 % — ABNORMAL HIGH (ref 11.5–15.5)
WBC: 8.9 10*3/uL (ref 4.0–10.5)
nRBC: 0 % (ref 0.0–0.2)

## 2021-10-10 LAB — GLUCOSE, CAPILLARY
Glucose-Capillary: 114 mg/dL — ABNORMAL HIGH (ref 70–99)
Glucose-Capillary: 131 mg/dL — ABNORMAL HIGH (ref 70–99)
Glucose-Capillary: 136 mg/dL — ABNORMAL HIGH (ref 70–99)
Glucose-Capillary: 138 mg/dL — ABNORMAL HIGH (ref 70–99)
Glucose-Capillary: 143 mg/dL — ABNORMAL HIGH (ref 70–99)
Glucose-Capillary: 147 mg/dL — ABNORMAL HIGH (ref 70–99)

## 2021-10-10 LAB — RENAL FUNCTION PANEL
Albumin: 2.4 g/dL — ABNORMAL LOW (ref 3.5–5.0)
Anion gap: 16 — ABNORMAL HIGH (ref 5–15)
BUN: 85 mg/dL — ABNORMAL HIGH (ref 6–20)
CO2: 23 mmol/L (ref 22–32)
Calcium: 8.9 mg/dL (ref 8.9–10.3)
Chloride: 103 mmol/L (ref 98–111)
Creatinine, Ser: 14.57 mg/dL — ABNORMAL HIGH (ref 0.61–1.24)
GFR, Estimated: 3 mL/min — ABNORMAL LOW (ref >60)
Glucose, Bld: 137 mg/dL — ABNORMAL HIGH (ref 70–99)
Phosphorus: 7.4 mg/dL — ABNORMAL HIGH (ref 2.5–4.6)
Potassium: 3.6 mmol/L (ref 3.5–5.1)
Sodium: 142 mmol/L (ref 135–145)

## 2021-10-10 LAB — IRON AND TIBC
Iron: 70 ug/dL (ref 45–182)
Saturation Ratios: 46 % — ABNORMAL HIGH (ref 17.9–39.5)
TIBC: 154 ug/dL — ABNORMAL LOW (ref 250–450)
UIBC: 84 ug/dL

## 2021-10-10 LAB — FERRITIN: Ferritin: 1449 ng/mL — ABNORMAL HIGH (ref 24–336)

## 2021-10-10 LAB — MAGNESIUM: Magnesium: 2.3 mg/dL (ref 1.7–2.4)

## 2021-10-10 MED ORDER — DELFLEX-LC/1.5% DEXTROSE 344 MOSM/L IP SOLN: INTRAPERITONEAL | Status: DC

## 2021-10-10 MED ORDER — DARBEPOETIN ALFA 60 MCG/0.3ML IJ SOSY
60.0000 ug | PREFILLED_SYRINGE | INTRAMUSCULAR | Status: DC
  Administered 2021-10-10: 60 ug via SUBCUTANEOUS
  Filled 2021-10-10 (×2): qty 0.3

## 2021-10-10 NOTE — Evaluation (Signed)
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   Severe aortic stenosis    Allergy, unspecified, sequela 03/20/2020   Anemia in chronic kidney disease 03/20/2020   Coagulation defect, unspecified ([place]) 03/20/2020   ESRD on peritoneal dialysis ([place]) 03/20/2020   History of urinary stone 03/20/2020   Iron deficiency anemia, unspecified 03/20/2020   Nephrotic syndrome with focal and segmental glomerular lesions 03/20/2020   Secondary hyperparathyroidism of renal origin ([place]) 03/20/2020   Dilated aortic root ([place]) 12/14/2017   Morbid obesity ([place]) 10/04/2016   Paroxysmal atrial fibrillation ([place]) 03/02/2016   Branch retinal artery occlusion 03/01/2016   Mild aortic stenosis 08/11/2014   Gout    Carotid bruit 02/09/2012   OSA (obstructive sleep apnea) 12/06/2010   CKD (chronic kidney disease), stage III ([place]) 12/06/2010   CAD (coronary artery disease) 12/06/2010   Mixed hyperlipidemia 12/06/2010   HTN (hypertension) 12/06/2010   Bradycardia 12/06/2010   Past Medical History:  Past Medical History:  Diagnosis Date   Anemia    Bicuspid aortic valve    Blood transfusion without reported diagnosis    CAD (coronary artery disease)    proximal LAD 50%, mid LAD 50-60%, mid to distal LAD 99%; mid circumflex 20%; proximal RCA 20%, distal RCA 50%; PDA 60-70%, proximal posterior AV groove 50%, proximal posterior lateral 50%.  There was a question of possible LVOT gradient;  s/p Resolute DES to mid-distal LAD 10/2010;  echocardiogram 11/17/10: EF 55-60%, mild LVH,  grade 1 diastolic dysfunction, normal aortic valve, mild MR, PASP 32.    Chicken pox    Enlarged aorta ([name])    ESRD on peritoneal dialysis ([name])    Glaucoma    Gout    allopurinol '300mg'$ , colchicine prn in past, has not had flares   Headache    History of kidney stones    Hypercholesterolemia    With hypertriglyceridemia   Hypertension    Mild aortic stenosis    PAF (paroxysmal atrial fibrillation) ([place])    a. Documented on event monitor 01/2016.   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   SDH (subdural hematoma) ([place]) 08/2021   left   Sleep apnea    cpap nightly   Past Surgical History:  Past Surgical History:  Procedure Laterality Date   CORONARY STENT PLACEMENT  2012   CRANIOTOMY Left 10/05/2021   Procedure: CRANIOTOMY HEMATOMA EVACUATION SUBDURAL;  Surgeon: Ashok Pall, MD;  Location: [place];  Service: Neurosurgery;  Laterality: Left;   CRANIOTOMY Left 10/07/2021   Procedure: Craniectomy for Recurrent  Intracranial Hematoma;  Surgeon: Ashok Pall, MD;  Location: [place];  Service: Neurosurgery;  Laterality: Left;   CYSTOSCOPY/RETROGRADE/URETEROSCOPY  03/26/2012   Procedure: CYSTOSCOPY/RETROGRADE/URETEROSCOPY;  Surgeon: [name], MD;  [facility];  Service: Urology;  Laterality: Bilateral;  CYSTOSCOPY BILATERAL RETROGRADE PYELOGRAM AND POSSIBLE URETEROSCOPY   INTRAOPERATIVE TRANSTHORACIC ECHOCARDIOGRAM N/A 01/19/2021   Procedure: INTRAOPERATIVE TRANSTHORACIC ECHOCARDIOGRAM;  Surgeon: Burnell Blanks, MD;  Location: [place];  Service: Open Heart Surgery;  Laterality: N/A;   IR ANGIO EXTERNAL CAROTID SEL EXT CAROTID UNI L MOD SED  09/28/2021   IR ANGIO INTRA EXTRACRAN SEL INTERNAL CAROTID UNI L MOD SED  09/28/2021   IR ANGIOGRAM FOLLOW UP STUDY  09/28/2021   IR ANGIOGRAM FOLLOW UP STUDY  09/30/2021   IR NEURO EACH  ADD'L AFTER BASIC UNI LEFT (MS)  09/28/2021   IR NEURO EACH ADD'L AFTER BASIC UNI RIGHT (MS)  09/30/2021   IR TRANSCATH/EMBOLIZ  09/28/2021   RADIOLOGY WITH ANESTHESIA N/A 09/28/2021   Procedure: Middle meningeal artery embolization;  Surgeon: Consuella Lose, MD;  Location: [place];  Service: Radiology;  Laterality: N/A;   RIGHT HEART CATH AND CORONARY ANGIOGRAPHY N/A 12/04/2020   Procedure: RIGHT HEART CATH AND CORONARY ANGIOGRAPHY;  Surgeon: Burnell Blanks, MD;  Location: [place] [facility];  Service: Cardiovascular;  Laterality: N/A;   TRANSCATHETER AORTIC VALVE REPLACEMENT, TRANSFEMORAL N/A 01/19/2021   Procedure: TRANSCATHETER AORTIC VALVE REPLACEMENT, TRANSFEMORAL USING A 26MM EDWARDS VALVE.;  Surgeon: Burnell Blanks, MD;  Location: [place];  Service: Open Heart Surgery;  Laterality: N/A;   HPI:  Pt is a 61 y.o. male who presented 10/05/21 with repeat head CT showing an increasing amount of subdural blood and L to R shift. 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 S/p craniotomy hematoma evacuation subdural 8/8. S/p craniectomy for Recurrent Intracranial Hematoma 8/10. Intubated briefly x2 for surgeries. PMH: anemia, bicuspid aortic valve, CAD, enlarged aorta, ESRD on peritoneal dialysis, glaucoma, gout, hypercholesterolemia, HTN, mild aortic stenosis, PAF, s/p TAVR, SDH, sleep apnea.   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 He is unable to repeat single vowels or consonant-vowel combinations; unable to produce any automatic sequences or intonational patterns in repetition. Comprehension is better than he can demonstrate; apraxia interferes with output; however, there remains a component of receptive aphasia ( command-following and yes/no reliability are compromised.) Recommend  SLP f/u while in acute care and in AIR to address the above deficits.    SLP Assessment  SLP Recommendation/Assessment: Patient needs continued Speech [place] [facility] SLP Visit Diagnosis: Aphasia (R47.01)    Recommendations for follow up therapy are one component of a multi-disciplinary discharge planning process, led by the attending physician.  Recommendations may be updated based on patient status, additional functional criteria and insurance authorization.    Follow Up Recommendations  Acute inpatient rehab (3hours/day)    Assistance Recommended at Discharge  Frequent or constant Supervision/Assistance  Functional Status Assessment Patient has had a recent decline in their functional status and demonstrates the ability to make significant improvements in function in a reasonable and predictable amount of time.  Frequency and Duration min 2x/week  2 weeks      SLP Evaluation Cognition  Overall Cognitive Status: Difficult to assess (due to speech and language deficits) Arousal/Alertness: Awake/alert       Comprehension  Auditory Comprehension Overall Auditory Comprehension: Impaired Yes/No Questions: Impaired Basic Biographical Questions: 26-50% accurate Commands: Impaired One Step Basic Commands: 25-49% accurate EffectiveTechniques: Extra processing time Visual Recognition/Discrimination Discrimination: Exceptions to [facility] Pictures: Able in field of 2 Reading Comprehension Reading Status: Not tested    Expression Expression Primary Mode of Expression: Verbal Verbal Expression Overall Verbal Expression: Impaired Initiation: Impaired Level of Generative/Spontaneous Verbalization: Word Repetition: Impaired Level of  Impairment: Word level Naming: Impairment Pictures: Able in field of 2 Written Expression Dominant Hand: Right   Oral / Motor  Oral Motor/Sensory Function Overall Oral Motor/Sensory Function: Within functional limits Motor Speech Overall  Motor Speech: Impaired Motor Planning: Impaired Level of Impairment: Word            Assunta Curtis 10/10/2021, 3:28 PM Estill Bamberg L. Tivis Ringer, MA CCC/SLP Clinical Specialist - [place] Office number [phone]

## 2021-10-10 NOTE — Progress Notes (Signed)
PD tx initation note:   Pre TX VS: 79, 16, 142/69, 98.5, and 98%  Pre TX weight: 90 kg  PD treatment initiated via aseptic technique. Consent signed and in chart. Patient is alert and oriented. No complaints of pain. No specimen collected. PD exit site clean, dry and intact. Gentamycin and new dressing applied. Bedside RN educated on PD machine and how to contact tech support when PD machine alarms.

## 2021-10-10 NOTE — Progress Notes (Signed)
Spoke with pt and wife about CPAP they still wish to hold for now. RT told them if they change their minds to call.

## 2021-10-10 NOTE — Progress Notes (Signed)
NAME:  Daniel Solomon, MRN:  [id number], DOB:  01/30/1961, LOS: 5 ADMISSION DATE:  10/05/2021, CONSULTATION DATE:  8/8 REFERRING MD:  Christella Noa, CHIEF COMPLAINT:  post procedure ventilator management   History of Present Illness:  61 y/o male with multiple medical problems underwent an emergent craniotomy and hematoma evacuation on 8/8 in the setting of new onset aphasia and weakness due to an enlarging subdural hematoma.  He underwent an elective middle meningeal artery embolism on 8/1.  Craniotomy with hematoma evacuation on 8/8, then extubated on 8/9.  Pertinent  Medical History  ESRD CAD Hyperlipidemia Aortic stenosis Paroxysmal atrial fibrillation SDH Hypertension OSA Gout  Significant Hospital Events: Including procedures, antibiotic start and stop dates in addition to other pertinent events   8/8 admission, underwent craniotomy with hematoma evacuation, sent to ICU on vent 8/9 extubated 8/10 back to the OR for redo craniotomy given increased shift noted on head CT Extubated 8/11  Interim History / Subjective:  No significant interval change Working with PT, out of bed Remains n.p.o., speech therapy to see CPAP deferred overnight due to craniotomy  Objective   Blood pressure (!) 140/79, pulse 65, temperature 98.3 F (36.8 C), resp. rate 14, height '5\' 8"'$  (1.727 m), weight 87.2 kg, SpO2 97 %.        Intake/Output Summary (Last 24 hours) at 10/10/2021 0951 Last data filed at 10/10/2021 0630 Gross per 24 hour  Intake 19272.5 ml  Output 18063 ml  Net 1209.5 ml   Filed Weights   10/05/21 2154 10/07/21 1142 10/08/21 1500  Weight: 87.8 kg 86.3 kg 87.2 kg    Examination: Unchanged >>  General: Up to chair.  Ill-appearing.   HENT: Right eye bruising, scalp swelling incision clean PULM: Clear bilaterally CV: Regular, no murmur, trace pretibial edema GI: Nondistended, positive bowel sounds MSK: No deformity Neuro: Moves extremities.  Will nod to questions and answer  simple questions yes/no.  Eyes are swollen, pupils not examined   Resolved Hospital Problem list     Assessment & Plan:  Left subacute subdural hematoma with brain compression.  Craniotomy on 8/8, then redo on 8/10 -Continue Keppra -Goal SBP <140.  Labetalol scheduled and available as needed.  Torsemide scheduled  Remains critically ill due to acute metabolic encephalopathy due to uremia, narcotics, untreated OSA with brain compression from SDH -Continue to minimize narcotics and sedating medications -Any subsequent imaging as per Dr. Lacy Duverney plans -Tolerating PD.  No clear indication for HD, unclear whether uremia contributing to suppressed mental status.  Appreciate nephrology management  Need for mechanical ventilation after neurosurgery.  Initially extubated 8/9, mechanically ventilated 8/10 for redo OSA> unable to use hospital masks due to craniotomy -Push pulmonary hygiene -Aspiration precautions -Restart CPAP when safe to do so postcraniotomy  Paroxysmal atrial fibrillation, previously on Eliquis -Telemetry monitoring -Eliquis on hold -Labetalol as ordered  CAD Aortic stenosis, s/p TAVR -Continue telemetry monitoring -Labetalol as ordered -Torsemide as ordered  Hypertension -Labetalol as ordered  Hyperlipidemia -Atorvastatin  ESRD on Peritoneal dialysis, uremia -Appreciate nephrology management -Plan to continue nocturnal PD -Follow urine output, BMP -Replace electrolytes as indicated -Renal dose medications  Gout -Continue allopurinol as ordered  Best Practice (right click and "Reselect all SmartList Selections" daily)   Diet/type: NPO DVT prophylaxis: SCD GI prophylaxis: N/A Lines: N/A Foley:  Yes, and it is no longer needed > remove Code Status:  full code Last date of multidisciplinary goals of care discussion [per NSGY, wife updated bedside by [facility] on 8/10] Family:  Wife updated at bedside on 8/13  Critical care time: NA     Baltazar Apo, MD, PhD 10/10/2021, 9:51 AM [facility] [phone] or if no answer before 7:00PM call [phone] For any issues after 7:00PM please call eLink [phone]

## 2021-10-10 NOTE — Progress Notes (Signed)
Daniel Solomon Progress Note  Subjective: up in chair, extubated, doing much better. PD w/o issues.   Vitals:   10/10/21 1000 10/10/21 1100 10/10/21 1200 10/10/21 1400  BP:  135/75 128/88 137/86  Pulse: 75 60 73 63  Resp: (!) '9 14 15 10  '$ Temp:   98.5 F (36.9 C)   TempSrc:      SpO2: 97% 98% 99% 100%  Weight:      Height:        Exam:  alert, nad , much more alert and interacting  no jvd  Chest cta bilat  Cor reg no RG  Abd soft ntnd no ascites   Ext no LE edema   Alert, NF, ox3   RLQ PD cath intact, clear exit site    Home meds include - allopurinol, atorvastatin, auryxia 3 ac tid, calcitriol 0.25 qd, docusate, hydrocodone-aceta prn, labetalol 100 bid, levetiracetam, torsemide 100 qd, prns/ vits / supps        OP PD: CCPD [facility] 7d /wk 93kg  5 overnight, 3000 fill, last fill 2500 for daybag - last Hb 11.6 on 7/7, tsat 14%, ferr 754 - no esa or IV fe currently     Assessment/ Plan: SDH w/ brain compression - sp SDH evac and craniotomy 8/8. Went back to OR 8/11 for hematoma evacuation.  Doing much better w/ mentation. Per NSGY.   PAF - holding eliquis for now, per cards/ NSGY ESRD - on CCPD. Continue nightly CCPD. Creat down to 14, within his baseline range (creat 11-14).  CAD hx PCI / sp TAVR HTN - on home labetalol 100 bid and torsemide '100mg'$  qd. Off of IV meds, transferring out of ICU.  Volume - euvolemic on exam, under dry wt. Use all 1.5% w/ PD. Will dc torsemide for now as not eating.  Nutrition - getting tube feeds per cortrak Anemia esrd - not on esa, Hb continues to decline to low 8s today. Will start esa w/ darbe 60 mcg weekly. Get fe studies. Transfuse prn.  MBD ckd - CCa in range, phos is high. Cont daily vdra, and cont auryxia as binder.       Rob Dedrick Heffner 10/10/2021, 3:42 PM   Recent Labs  Lab 10/07/21 0249 10/07/21 1136 10/09/21 0547 10/10/21 0128  HGB 8.1*   < > 8.6* 8.0*  ALBUMIN 2.8*  --   --  2.4*  CALCIUM 9.3   < > 8.8* 8.9   PHOS  --   --  7.6* 7.4*  CREATININE 18.03*   < > 15.22* 14.57*  K 4.6   < > 4.4 3.6   < > = values in this interval not displayed.    Recent Labs  Lab 10/06/21 1042  IRON 79  TIBC 150*    Inpatient medications:  sodium chloride   Intravenous Once   allopurinol  100 mg Per Tube Daily   atorvastatin  40 mg Per Tube Daily   calcitRIOL  0.25 mcg Per Tube Daily   Chlorhexidine Gluconate Cloth  6 each Topical Daily   feeding supplement (PROSource TF20)  60 mL Per Tube BID   ferric citrate  630 mg Oral TID WC   gentamicin cream  1 Application Topical Daily   labetalol  100 mg Per Tube BID   levETIRAcetam  500 mg Per Tube BID   pantoprazole  40 mg Per Tube Daily   sennosides  5 mL Per Tube BID   torsemide  100 mg Per Tube Daily  sodium chloride 10 mL/hr at 10/05/21 1718   dialysis solution 1.5% low-MG/low-CA     feeding supplement (OSMOLITE 1.5 CAL) Stopped (10/10/21 1241)   acetaminophen **OR** acetaminophen, bisacodyl, heparin 2,500 Units in dialysis solution 1.5% low-MG/low-CA 5,000 mL dialysis solution, HYDROcodone-acetaminophen, labetalol, naLOXone (NARCAN)  injection, ondansetron **OR** ondansetron (ZOFRAN) IV

## 2021-10-10 NOTE — Evaluation (Signed)
Clinical/Bedside Swallow Evaluation Patient Details  Name: Daniel Solomon MRN: [id number] Date of Birth: 23-Sep-1960  Today's Date: 10/10/2021 Time: SLP Start Time (ACUTE ONLY): 1300 SLP Stop Time (ACUTE ONLY): 1315 SLP Time Calculation (min) (ACUTE ONLY): 15 min  Past Medical History:  Past Medical History:  Diagnosis Date   Anemia    Bicuspid aortic valve    Blood transfusion without reported diagnosis    CAD (coronary artery disease)    proximal LAD 50%, mid LAD 50-60%, mid to distal LAD 99%; mid circumflex 20%; proximal RCA 20%, distal RCA 50%; PDA 60-70%, proximal posterior AV groove 50%, proximal posterior lateral 50%.  There was a question of possible LVOT gradient;  s/p Resolute DES to mid-distal LAD 10/2010;  echocardiogram 11/17/10: EF 55-60%, mild LVH, grade 1 diastolic dysfunction, normal aortic valve, mild MR, PASP 32.    Chicken pox    Enlarged aorta ([name])    ESRD on peritoneal dialysis ([name])    Glaucoma    Gout    allopurinol '300mg'$ , colchicine prn in past, has not had flares   Headache    History of kidney stones    Hypercholesterolemia    With hypertriglyceridemia   Hypertension    Mild aortic stenosis    PAF (paroxysmal atrial fibrillation) (Daniel Solomon)    a. Documented on event monitor 01/2016.   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   SDH (subdural hematoma) ([place]) 08/2021   left   Sleep apnea    cpap nightly   Past Surgical History:  Past Surgical History:  Procedure Laterality Date   CORONARY STENT PLACEMENT  2012   CRANIOTOMY Left 10/05/2021   Procedure: CRANIOTOMY HEMATOMA EVACUATION SUBDURAL;  Surgeon: Daniel Pall, MD;  Location: [facility];  Service: Neurosurgery;  Laterality: Left;   CRANIOTOMY Left 10/07/2021   Procedure: Craniectomy for Recurrent  Intracranial Hematoma;  Surgeon: Daniel Pall, MD;  Location: [facility];   Service: Neurosurgery;  Laterality: Left;   CYSTOSCOPY/RETROGRADE/URETEROSCOPY  03/26/2012   Procedure: CYSTOSCOPY/RETROGRADE/URETEROSCOPY;  Surgeon: Daniel Jabs, MD;  Location: [facility];  Service: Urology;  Laterality: Bilateral;  CYSTOSCOPY BILATERAL RETROGRADE PYELOGRAM AND POSSIBLE URETEROSCOPY   INTRAOPERATIVE TRANSTHORACIC ECHOCARDIOGRAM N/A 01/19/2021   Procedure: INTRAOPERATIVE TRANSTHORACIC ECHOCARDIOGRAM;  Surgeon: Daniel Blanks, MD;  Location: [facility];  Service: Open Heart Surgery;  Laterality: N/A;   IR ANGIO EXTERNAL CAROTID SEL EXT CAROTID UNI L MOD SED  09/28/2021   IR ANGIO INTRA EXTRACRAN SEL INTERNAL CAROTID UNI L MOD SED  09/28/2021   IR ANGIOGRAM FOLLOW UP STUDY  09/28/2021   IR ANGIOGRAM FOLLOW UP STUDY  09/30/2021   IR NEURO EACH ADD'L AFTER BASIC UNI LEFT (MS)  09/28/2021   IR NEURO EACH ADD'L AFTER BASIC UNI RIGHT (MS)  09/30/2021   IR TRANSCATH/EMBOLIZ  09/28/2021   RADIOLOGY WITH ANESTHESIA N/A 09/28/2021   Procedure: Middle meningeal artery embolization;  Surgeon: Daniel Lose, MD;  Location: [facility];  Service: Radiology;  Laterality: N/A;   RIGHT HEART CATH AND CORONARY ANGIOGRAPHY N/A 12/04/2020   Procedure: RIGHT HEART CATH AND CORONARY ANGIOGRAPHY;  Surgeon: Daniel Blanks, MD;  Location: [facility];  Service: Cardiovascular;  Laterality: N/A;   TRANSCATHETER AORTIC VALVE REPLACEMENT, TRANSFEMORAL N/A 01/19/2021   Procedure: TRANSCATHETER AORTIC VALVE REPLACEMENT, TRANSFEMORAL USING A 26MM EDWARDS VALVE.;  Surgeon: Daniel Blanks, MD;  Location: [facility];  Service: Open Heart Surgery;  Laterality: N/A;   HPI:  Pt is a 61 y.o. male who presented 10/05/21 with repeat head CT showing an increasing amount of subdural blood and L to R shift. 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 S/p craniotomy hematoma evacuation subdural 8/8. S/p craniectomy for Recurrent Intracranial Hematoma 8/10. Intubated  briefly x2 for surgeries. PMH: anemia, bicuspid aortic valve, CAD, enlarged aorta, ESRD on peritoneal dialysis, glaucoma, gout, hypercholesterolemia, HTN, mild aortic stenosis, PAF, s/p TAVR, SDH, sleep apnea.    Assessment / Plan / Recommendation  Clinical Impression  Pt presents with dysphagia with an apraxic component - he coughed consistently after drinking thin liquids; coughing subsided with nectars. He was able to manipulate purees but had great difficulty managing cracker (unable to bite edge of cracker over multiple trials). Daniel Solomon agreed to start with dysphagia 1 consistencies, nectar thick liquids. We discussed the temporary nature of his dysphagia and the probable advancement of his diet over the next few days. SLP will follow. SLP Visit Diagnosis: Dysphagia, oropharyngeal phase (R13.12)           Diet Recommendation   Dysphagia 1, nectar thick liquids  Medication Administration: Whole meds with puree    Other  Recommendations Oral Care Recommendations: Oral care BID    Recommendations for follow up therapy are one component of a multi-disciplinary discharge planning process, led by the attending physician.  Recommendations may be updated based on patient status, additional functional criteria and insurance authorization.  Follow up Recommendations Acute inpatient rehab (3hours/day)      Assistance Recommended at Discharge Frequent or constant Supervision/Assistance  Functional Status Assessment Patient has had a recent decline in their functional status and demonstrates the ability to make significant improvements in function in a reasonable and predictable amount of time.  Frequency and Duration min 2x/week  2 weeks       Prognosis Prognosis for Safe Diet Advancement: Good      Swallow Study   General HPI: Pt is a 61 y.o. male who presented 10/05/21 with repeat head CT showing an increasing amount of subdural blood and L to R shift. Pt with known L SDH treated with  middle meningeal artery embolism 09/28/21. S/p craniotomy hematoma evacuation subdural 8/8. S/p craniectomy for Recurrent Intracranial Hematoma 8/10. Intubated briefly x2 for surgeries. PMH: anemia, bicuspid aortic valve, CAD, enlarged aorta, ESRD on peritoneal dialysis, glaucoma, gout, hypercholesterolemia, HTN, mild aortic stenosis, PAF, s/p TAVR, SDH, sleep apnea. Type of Study: Bedside Swallow Evaluation Previous Swallow Assessment: no Diet Prior to this Study: NPO Temperature Spikes Noted: No Respiratory Status: Room air History of Recent Intubation: Yes Length of Intubations (days):  (for procedure x2) Behavior/Cognition: Alert Oral Cavity Assessment: Within Functional Limits Oral Care Completed by SLP: Recent completion by staff Oral Cavity - Dentition: Adequate natural dentition Vision: Functional for self-feeding Self-Feeding Abilities: Needs assist Patient Positioning: Upright in chair Baseline Vocal Quality: Normal Volitional Cough: Cognitively unable to elicit Volitional Swallow: Unable to elicit    Oral/Motor/Sensory Function Overall Oral Motor/Sensory Function: Within functional limits   Ice Chips Ice chips: Within functional limits   Thin Liquid Thin Liquid: Impaired Presentation: Cup;Straw Pharyngeal  Phase Impairments: Throat Clearing - Immediate;Throat Clearing - Delayed    Nectar Thick Nectar Thick Liquid: Within functional limits   Honey Thick Honey Thick Liquid: Not tested   Puree Puree: Within functional limits   Solid     Solid: Impaired Oral  Phase Impairments: Reduced labial seal;Other (comment);Reduced lingual movement/coordination (apraxic movements - unable to chew cracker) Oral Phase Functional Implications: Impaired mastication      Juan Quam Laurice 10/10/2021,1:53 PM  Davielle Lingelbach L. Tivis Ringer, MA CCC/SLP Clinical Specialist - [place] Office number [phone]

## 2021-10-10 NOTE — Progress Notes (Signed)
Pt extubated, +Broca's but follows commands on the L and BLE, minimal RUE movement with increased tone. Head wrap in place and clean / dry. No change in neurosurgical plan of care

## 2021-10-10 NOTE — Progress Notes (Signed)
Neurosurgery Service Progress Note  Subjective: No acute events overnight, up and walking, speech improving   Objective: Vitals:   10/10/21 0900 10/10/21 1000 10/10/21 1100 10/10/21 1200  BP: (!) 141/83  135/75 128/88  Pulse: 63 75 60 73  Resp: 13 (!) '9 14 15  '$ Temp:    98.5 F (36.9 C)  TempSrc:      SpO2: 100% 97% 98% 99%  Weight:      Height:        Physical Exam: Head wrap d/c'd, incision c/d/I and healing well Sitting up in chair, some speech output, Fcx4 with some weakness in the RUE > RLE  Assessment & Plan:  -doing well, can transfer out of the unit to stepdown, will order a helmet  Judith Part  10/10/21 1:13 PM

## 2021-10-10 NOTE — Progress Notes (Signed)
Orthopedic Tech Progress Note Patient Details:  Daniel Solomon November 12, 1960 [id number]  Patient ID: Daniel Solomon, male   DOB: 1960-10-21, 61 y.o.   MRN: [id number] Order placed with [employer] for helmet. Vernona Rieger 10/10/2021, 1:49 PM

## 2021-10-11 ENCOUNTER — Inpatient Hospital Stay (HOSPITAL_COMMUNITY): Payer: Managed Care, Other (non HMO)

## 2021-10-11 DIAGNOSIS — G4733 Obstructive sleep apnea (adult) (pediatric): Secondary | ICD-10-CM | POA: Diagnosis not present

## 2021-10-11 DIAGNOSIS — Z9989 Dependence on other enabling machines and devices: Secondary | ICD-10-CM | POA: Diagnosis not present

## 2021-10-11 DIAGNOSIS — Z9889 Other specified postprocedural states: Secondary | ICD-10-CM | POA: Diagnosis not present

## 2021-10-11 LAB — CBC
HCT: 24.8 % — ABNORMAL LOW (ref 39.0–52.0)
Hemoglobin: 8.5 g/dL — ABNORMAL LOW (ref 13.0–17.0)
MCH: 33.5 pg (ref 26.0–34.0)
MCHC: 34.3 g/dL (ref 30.0–36.0)
MCV: 97.6 fL (ref 80.0–100.0)
Platelets: 117 10*3/uL — ABNORMAL LOW (ref 150–400)
RBC: 2.54 MIL/uL — ABNORMAL LOW (ref 4.22–5.81)
RDW: 15.6 % — ABNORMAL HIGH (ref 11.5–15.5)
WBC: 10.1 10*3/uL (ref 4.0–10.5)
nRBC: 0 % (ref 0.0–0.2)

## 2021-10-11 LAB — RENAL FUNCTION PANEL
Albumin: 2.7 g/dL — ABNORMAL LOW (ref 3.5–5.0)
Anion gap: 18 — ABNORMAL HIGH (ref 5–15)
BUN: 87 mg/dL — ABNORMAL HIGH (ref 6–20)
CO2: 23 mmol/L (ref 22–32)
Calcium: 9.5 mg/dL (ref 8.9–10.3)
Chloride: 101 mmol/L (ref 98–111)
Creatinine, Ser: 14.34 mg/dL — ABNORMAL HIGH (ref 0.61–1.24)
GFR, Estimated: 4 mL/min — ABNORMAL LOW (ref >60)
Glucose, Bld: 134 mg/dL — ABNORMAL HIGH (ref 70–99)
Phosphorus: 8.7 mg/dL — ABNORMAL HIGH (ref 2.5–4.6)
Potassium: 4 mmol/L (ref 3.5–5.1)
Sodium: 142 mmol/L (ref 135–145)

## 2021-10-11 LAB — GLUCOSE, CAPILLARY
Glucose-Capillary: 133 mg/dL — ABNORMAL HIGH (ref 70–99)
Glucose-Capillary: 108 mg/dL — ABNORMAL HIGH (ref 70–99)
Glucose-Capillary: 140 mg/dL — ABNORMAL HIGH (ref 70–99)
Glucose-Capillary: 120 mg/dL — ABNORMAL HIGH (ref 70–99)
Glucose-Capillary: 91 mg/dL (ref 70–99)

## 2021-10-11 LAB — MAGNESIUM: Magnesium: 2.3 mg/dL (ref 1.7–2.4)

## 2021-10-11 MED ORDER — DELFLEX-LC/1.5% DEXTROSE 344 MOSM/L IP SOLN: INTRAPERITONEAL | Status: DC

## 2021-10-11 MED ORDER — GENTAMICIN SULFATE 0.1 % EX CREA: 1.0000 | TOPICAL_CREAM | Freq: Every day | CUTANEOUS | Status: DC

## 2021-10-11 NOTE — Progress Notes (Signed)
   NAME:  Daniel Solomon, MRN:  [id number], DOB:  03/29/1960, LOS: 6 ADMISSION DATE:  10/05/2021, CONSULTATION DATE:  8/8 REFERRING MD:  Christella Noa, CHIEF COMPLAINT:  post procedure ventilator management   History of Present Illness:  61 y/o male with multiple medical problems underwent an emergent craniotomy and hematoma evacuation on 8/8 in the setting of new onset aphasia and weakness due to an enlarging subdural hematoma.  He underwent an elective middle meningeal artery embolism on 8/1.  Craniotomy with hematoma evacuation on 8/8, then extubated on 8/9.  Pertinent  Medical History  ESRD CAD Hyperlipidemia Aortic stenosis Paroxysmal atrial fibrillation SDH Hypertension OSA Gout  Significant Hospital Events: Including procedures, antibiotic start and stop dates in addition to other pertinent events   8/8 admission, underwent craniotomy with hematoma evacuation, sent to ICU on vent 8/9 extubated 8/10 back to the OR for redo craniotomy given increased shift noted on head CT Extubated 8/11  Interim History / Subjective:  No overnight issues other than poor sleep due to room change, bed discomfort, not being able to wear cpap Wife at bedside Objective   Blood pressure (!) 155/99, pulse 72, temperature 98 F (36.7 C), temperature source Oral, resp. rate 16, height '5\' 8"'$  (1.727 m), weight 90 kg, SpO2 91 %.        Intake/Output Summary (Last 24 hours) at 10/11/2021 1308 Last data filed at 10/11/2021 0900 Gross per 24 hour  Intake 1175.16 ml  Output --  Net 1175.16 ml   Filed Weights   10/07/21 1142 10/08/21 1500 10/11/21 0500  Weight: 86.3 kg 87.2 kg 90 kg    Examination: Sitting up in bed Left hemicraniotomy incision with staples CDI Aphasic    Resolved Hospital Problem list     Assessment & Plan:  Left subacute subdural hematoma with brain compression.  Craniotomy on 8/8, then redo on 8/10 -Continue Keppra -Goal SBP <140.  Labetalol scheduled and available as needed.    OSA on CPAP -Restart CPAP when safe to do so postcraniotomy - followed by PCP at home for this  CAD Aortic stenosis, s/p TAVR Hyperlipidemia Hypertension Paroxysmal atrial fibrillation, previously on Eliquis -Continue telemetry monitoring -Labetalol as ordered - prn diuretics -Atorvastatin - tele, eliquis held  ESRD on Peritoneal dialysis, uremia -Appreciate nephrology management -Plan to continue nocturnal PD -Follow urine output, BMP -Replace electrolytes as indicated -Renal dose medications  Gout -Continue allopurinol as ordered  Consult to [facility] for management of medical issues as above. NSGY still primary.  PCCM will see as needed.   Lenice Llamas, MD Pulmonary and [place] 10/11/2021 1:19 PM Pager: see AMION  If no response to pager, please call critical care on call (see AMION) until 7pm After 7:00 pm call [place]

## 2021-10-11 NOTE — Progress Notes (Signed)
Subjective: Patient resting comfortably in the chair  Objective: Vital signs in last 24 hours: Temp:  [98 F (36.7 C)-98.5 F (36.9 C)] 98 F (36.7 C) (08/14 0829) Pulse Rate:  [54-96] 80 (08/14 0829) Resp:  [10-19] 16 (08/14 0829) BP: (127-156)/(59-104) 155/93 (08/14 0829) SpO2:  [95 %-100 %] 98 % (08/14 0829) Weight:  [90 kg] 90 kg (08/14 0500)  Intake/Output from previous day: 08/13 0701 - 08/14 0700 In: 1365.8 [NG/GT:1365.8] Out: -  Intake/Output this shift: Total I/O In: 120 [P.O.:120] Out: -   Neurologic: Grossly normal  Lab Results: Lab Results  Component Value Date   WBC 10.1 10/11/2021   HGB 8.5 (L) 10/11/2021   HCT 24.8 (L) 10/11/2021   MCV 97.6 10/11/2021   PLT 117 (L) 10/11/2021   Lab Results  Component Value Date   INR 1.5 (H) 10/08/2021   BMET Lab Results  Component Value Date   NA 142 10/11/2021   K 4.0 10/11/2021   CL 101 10/11/2021   CO2 23 10/11/2021   GLUCOSE 134 (H) 10/11/2021   BUN 87 (H) 10/11/2021   CREATININE 14.34 (H) 10/11/2021   CALCIUM 9.5 10/11/2021    Studies/Results: No results found.  Assessment/Plan: S/p crani twice last week. Still speech difficulty but follows commands and ambulating well. Continue to work with therapy.    LOS: 6 days    Ocie Cornfield Cordaro Mukai 10/11/2021, 11:19 AM

## 2021-10-11 NOTE — Progress Notes (Signed)
[facility] Progress Note  Subjective: no acute events. Patient seen and examined in room, currently on PD. Wife at bedside, she reports that there were some drainage issues due to patient turning positions throughout the night but otherwise PD is going well so far.  Vitals:   10/11/21 0440 10/11/21 0445 10/11/21 0500 10/11/21 0829  BP:    (!) 155/93  Pulse: 74 65  80  Resp: '18 16  16  '$ Temp:    98 F (36.7 C)  TempSrc:    Oral  SpO2: 98% 97%  98%  Weight:   90 kg   Height:        Exam: Gen: NAD, standing up in room HEENT: +NGT CV: RRR Resp: CTA bl Abd: soft, NT Neuro: awake, alert, standing up in room Dialysis access: RLQ PD cath in use  Recent Labs  Lab 10/10/21 0128 10/11/21 0714  HGB 8.0* 8.5*  ALBUMIN 2.4* 2.7*  CALCIUM 8.9 9.5  PHOS 7.4* 8.7*  CREATININE 14.57* 14.34*  K 3.6 4.0   Recent Labs  Lab 10/06/21 1042 10/10/21 1608  IRON 79 70  TIBC 150* 154*  FERRITIN  --  1,449*   Inpatient medications:  sodium chloride   Intravenous Once   allopurinol  100 mg Per Tube Daily   atorvastatin  40 mg Per Tube Daily   calcitRIOL  0.25 mcg Per Tube Daily   Chlorhexidine Gluconate Cloth  6 each Topical Daily   darbepoetin (ARANESP) injection - NON-DIALYSIS  60 mcg Subcutaneous Q Sun-1800   feeding supplement (PROSource TF20)  60 mL Per Tube BID   ferric citrate  630 mg Oral TID WC   gentamicin cream  1 Application Topical Daily   labetalol  100 mg Per Tube BID   levETIRAcetam  500 mg Per Tube BID   pantoprazole  40 mg Per Tube Daily   sennosides  5 mL Per Tube BID    sodium chloride 10 mL/hr at 10/05/21 1718   dialysis solution 1.5% low-MG/low-CA     feeding supplement (OSMOLITE 1.5 CAL) 1,000 mL (10/10/21 2233)   acetaminophen **OR** acetaminophen, bisacodyl, heparin 2,500 Units in dialysis solution 1.5% low-MG/low-CA 5,000 mL dialysis solution, HYDROcodone-acetaminophen, labetalol, naLOXone (NARCAN)  injection, ondansetron **OR** ondansetron  (ZOFRAN) IV   OP PD: CCPD [facility] 7d /wk 93kg  5 overnight, 3000 fill, last fill 2500 for daybag - last Hb 11.6 on 7/7, tsat 14%, ferr 754 - no esa or IV fe currently    Assessment/ Plan: SDH w/ brain compression - sp SDH evac and craniotomy 8/8. Went back to OR 8/11 for hematoma evacuation.  Doing much better w/ mentation. Per NSGY.   PAF - holding eliquis for now, per cards/ NSGY ESRD - on CCPD. Continue nightly CCPD-1.5% bags CAD hx PCI / sp TAVR HTN - on home labetalol 100 bid. torsemide '100mg'$  qd on hold in the context of being under edw Volume - euvolemic on exam, under dry wt. Use all 1.5% w/ PD. Torsemide had been discontinued Nutrition - getting tube feeds per cortrak Anemia esrd - not on esa, Hb 8.5 today. Started on esa w/ darbe 60 mcg weekly 8/13. fe studies: iron replete. Transfuse prn.  MBD ckd - CCa in range, phos is high. Cont daily vdra, and cont auryxia as binder.

## 2021-10-11 NOTE — Progress Notes (Addendum)
SLP Cancellation Note  Patient Details Name: Daniel Solomon MRN: [id number] DOB: 1960-09-04   Cancelled treatment:       Reason Eval/Treat Not Completed: Other   Unable to assess readiness to advance textures at this time, as pt is currently sleeping soundly due to not sleeping last night. RN reports pt had a episode of vomiting this morning. Visitor at bedside requested hold for now, and try again later. Will continue efforts.   Attempted diet tol/advancement trials this afternoon. Pt currently off unit for CT. Will continue efforts.   Aphrodite Harpenau B. Quentin Ore, [facility], [place] Speech Language Pathologist Office: [phone]  Shonna Chock 10/11/2021, 12:24 PM, 13:34 PM

## 2021-10-11 NOTE — Progress Notes (Signed)
Inpatient Rehab Admissions Coordinator:  Saw pt and wife at bedside. Pt was alert so discussed CIR goals and expectations with him. He nodded his head in agreement. Answered wife's questions.  Await updated OT note prior to beginning insurance authorization. Will continue to follow.   Gayland Curry, [place], [place] Admissions Coordinator [phone]

## 2021-10-11 NOTE — Progress Notes (Signed)
Patient refused CPAP HS. No distress noted at this time.

## 2021-10-11 NOTE — Procedures (Signed)
PD Note Patient up in the chair, alert and oriented although not verbal with interactions.  Demonstrated that he was aware of the conversations that were occurring. Wife in room with  patient and will be staying during the night. The patient has decided to sleep in the chair for comfort. The area around the tube was shaved as the tape removal was creating pain.

## 2021-10-11 NOTE — Progress Notes (Signed)
Occupational Therapy Treatment Patient Details Name: Daniel Solomon MRN: [id number] DOB: [birth year] Today's Date: 10/11/2021   History of present illness Pt is a 61 y.o. male who presented 10/05/21 with repeat head CT showing an increasing amount of subdural blood and L to R shift. 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 S/p craniotomy hematoma evacuation subdural 8/8. S/p craniectomy for Recurrent Intracranial Hematoma 8/10. PMH: anemia, bicuspid aortic valve, CAD, enlarged aorta, ESRD on peritoneal dialysis, glaucoma, gout, hypercholesterolemia, HTN, mild aortic stenosis, PAF, s/p TAVR, SDH, sleep apnea.   OT comments  This 61 yo is making great progress since last seen. He now can open both eyes and is attempting to do and doing more with his RUE, but is still not truly functional as his dominant UE. He is motivated to do as much as he can by himself but is still lacking some insight as to his deficits. He has word finding problems but sticks with it until he can tell you what he wants to say (at least during our session). 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 He is able to move about his room in a min guard-min A level. He will continue to benefit from acute OT with follow up on AIR to give him the fastest and most efficient path to recovery.    Recommendations for follow up therapy are one component of a multi-disciplinary discharge planning process, led by the attending physician.  Recommendations may be updated based on patient status, additional functional criteria and insurance authorization.    Follow Up Recommendations  Acute inpatient rehab (3hours/day)    Assistance Recommended at Discharge Frequent or constant Supervision/Assistance  Patient can return home with the following  A little help with walking and/or transfers;A little help with bathing/dressing/bathroom;Assistance with feeding;Assistance with  cooking/housework;Assist for transportation;Help with stairs or ramp for entrance;Direct supervision/assist for financial management;Direct supervision/assist for medications management   Equipment Recommendations  Other (comment) (TBD Next venue)       Precautions / Restrictions Precautions Precautions: Fall Precaution Comments: No flap - Helmet Required Braces or Orthoses: Other Brace Other Brace: Helmet Restrictions Weight Bearing Restrictions: No       Mobility Bed Mobility               General bed mobility comments: up in bathroom upon my entry    Transfers Overall transfer level: Needs assistance Equipment used: None Transfers: Sit to/from Stand Sit to Stand: Min assist                 Balance Overall balance assessment: Needs assistance Sitting-balance support: No upper extremity supported, Feet supported Sitting balance-Leahy Scale: Good     Standing balance support: No upper extremity supported, During functional activity Standing balance-Leahy Scale: Fair                             ADL either performed or assessed with clinical judgement   ADL Overall ADL's : Needs assistance/impaired   Eating/Feeding Details (indicate cue type and reason): Encouraged wife to let him try and self feed 25% of his next meal Grooming: Wash/dry hands;Min guard;Standing Grooming Details (indicate cue type and reason): found soap on his own, VCs to find paper towel dispenser further to his right                 Toilet Transfer: Minimal assistance;Ambulation;Regular Toilet;Grab bars  Toileting- Clothing Manipulation and Hygiene: Minimal assistance;Sit to/from stand Toileting - Clothing Manipulation Details (indicate cue type and reason): A to get the toilet paper off of roll for him (it was hard to reach from toilet)            Extremity/Trunk Assessment Upper Extremity Assessment Upper Extremity Assessment: RUE deficits/detail RUE  Deficits / Details: Pt attempting to use his RUE functionally 75% of session. With increased time he was able to to unlace 75% of one of shoes and re-lace 50% wiht verbal and gestural cues to unlace using both hands but he automatically used both hands to re-lace,Able to foled a towel in mid air using Bil UEs~75% of normal rate. RUE Sensation: decreased proprioception RUE Coordination: decreased fine motor;decreased gross motor             Cognition Arousal/Alertness: Awake/alert Behavior During Therapy:  (smiling intermittently) Overall Cognitive Status: Impaired/Different from baseline Area of Impairment: Memory, Following commands, Awareness, Problem solving                     Memory:  (could not recall his grandkids names) Following Commands: Follows one step commands inconsistently   Awareness: Emergent Problem Solving: Slow processing, Requires tactile cues, Requires verbal cues, Difficulty sequencing General Comments: He had a hard time recalling now to spell his wife's name; had to demonstrate some of the tasks I asked him today        Exercises Other Exercises Other Exercises: Educated pt and wife on RUE and Bil UE tasks, gave them 2 handouts (one on FM and one on theraputty), issued pt yellow theraputty. He returned demonstrated all of the FM tasks on the sheet I checked for him to do except for the card ones.            Pertinent Vitals/ Pain       Pain Assessment Pain Assessment: No/denies pain         Frequency  Min 2X/week        Progress Toward Goals  OT Goals(current goals can now be found in the care plan section)  Progress towards OT goals: Progressing toward goals  Acute Rehab OT Goals OT Goal Formulation: With patient/family Time For Goal Achievement: 10/22/21 Potential to Achieve Goals: Good  Plan Discharge plan remains appropriate       AM-PAC OT "6 Clicks" Daily Activity     Outcome Measure   Help from another person eating  meals?: A Little Help from another person taking care of personal grooming?: A Little Help from another person toileting, which includes using toliet, bedpan, or urinal?: A Little Help from another person bathing (including washing, rinsing, drying)?: A Little Help from another person to put on and taking off regular upper body clothing?: A Little Help from another person to put on and taking off regular lower body clothing?: A Little 6 Click Score: 18    End of Session    OT Visit Diagnosis: Unsteadiness on feet (R26.81);Other abnormalities of gait and mobility (R26.89);Muscle weakness (generalized) (M62.81);Other symptoms and signs involving cognitive function;Low vision, both eyes (H54.2);Hemiplegia and hemiparesis Hemiplegia - Right/Left: Right Hemiplegia - dominant/non-dominant: Dominant Hemiplegia - caused by:  (chronic SDH s/p surgery)   Activity Tolerance Patient tolerated treatment well   Patient Left in chair;with family/visitor present         Time: 2248-2500 OT Time Calculation (min): 42 min  Charges: OT General Charges $OT Visit: 1 Visit OT Treatments $Self Care/Home Management :  8-22 mins $Therapeutic Activity: 23-37 mins  Golden Circle, OTR/L Acute Rehab Services [facility] [phone] Office [phone]  Almon Register 10/11/2021, 7:24 PM

## 2021-10-11 NOTE — Progress Notes (Signed)
  Transition of Care [facility]) Screening Note   Patient Details  Name: Daniel Solomon Date of Birth: Feb 18, 1961   Transition of Care [facility]) CM/SW Contact:    Pollie Friar, RN Phone Number: 10/11/2021, 2:36 PM   Pt is from home with spouse. He is on peritoneal dialysis at home. CIR has started insurance for a rehab admit.  Transition of Care Department [facility]) has reviewed patient. We will continue to monitor patient advancement through interdisciplinary progression rounds. If new patient transition needs arise, please place a TOC consult.

## 2021-10-11 NOTE — Progress Notes (Signed)
Henry reviewed. Overall looks good with no concerning finding

## 2021-10-11 NOTE — Progress Notes (Signed)
Physical Therapy Treatment Patient Details Name: Daniel Solomon MRN: [id number] DOB: 08-29-1960 Today's Date: 10/11/2021   History of Present Illness Pt is a 61 y.o. male who presented 10/05/21 with repeat head CT showing an increasing amount of subdural blood and L to R shift. 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 S/p craniotomy hematoma evacuation subdural 8/8. S/p craniectomy for Recurrent Intracranial Hematoma 8/10. PMH: anemia, bicuspid aortic valve, CAD, enlarged aorta, ESRD on peritoneal dialysis, glaucoma, gout, hypercholesterolemia, HTN, mild aortic stenosis, PAF, s/p TAVR, SDH, sleep apnea.    PT Comments    Continues to progress well towards acute PT goals. Tolerating increased distances of 150 feet today during gait training, working on balance, RW control, obstacle navigation in congested areas, and orientation/recall, at a min assist level. LE exercises tolerated well. Still having cognitive difficulties but eager to work with therapy. Patient will continue to benefit from skilled physical therapy services to further improve independence with functional mobility.    Recommendations for follow up therapy are one component of a multi-disciplinary discharge planning process, led by the attending physician.  Recommendations may be updated based on patient status, additional functional criteria and insurance authorization.  Follow Up Recommendations  Acute inpatient rehab (3hours/day)     Assistance Recommended at Discharge Frequent or constant Supervision/Assistance  Patient can return home with the following A lot of help with bathing/dressing/bathroom;Assistance with cooking/housework;Direct supervision/assist for medications management;Direct supervision/assist for financial management;Assist for transportation;Help with stairs or ramp for entrance;A little help with walking and/or transfers   Equipment Recommendations  Rolling walker (2  wheels);BSC/3in1    Recommendations for Other Services       Precautions / Restrictions Precautions Precautions: Fall Precaution Comments: No flap - Helmet Required Braces or Orthoses: Other Brace Other Brace: Helmet Restrictions Weight Bearing Restrictions: No     Mobility  Bed Mobility               General bed mobility comments: In recliner    Transfers Overall transfer level: Needs assistance Equipment used: Rolling walker (2 wheels) Transfers: Sit to/from Stand Sit to Stand: Min guard           General transfer comment: Min guard for safety, assist with lines and leads only. Cues for technique.    Ambulation/Gait Ambulation/Gait assistance: Min assist Gait Distance (Feet): 150 Feet Assistive device: Rolling walker (2 wheels) Gait Pattern/deviations: Decreased step length - right, Decreased dorsiflexion - right, Trunk flexed, Step-through pattern Gait velocity: reduced Gait velocity interpretation: <1.8 ft/sec, indicate of risk for recurrent falls   General Gait Details: Improved mechanics from previous visit. Demonstrates step-through gait pattern. Intermittent min assist for balance, notably with sharp turns. Tactile cues for which direction to turn. Min assist for RW control and cues to avoid objects in congested spaces. No buckling throughout.   Stairs             Wheelchair Mobility    Modified Rankin (Stroke Patients Only) Modified Rankin (Stroke Patients Only) Pre-Morbid Rankin Score: No symptoms Modified Rankin: Moderately severe disability     Balance Overall balance assessment: Needs assistance Sitting-balance support: No upper extremity supported, Feet supported Sitting balance-Leahy Scale: Good     Standing balance support: During functional activity, No upper extremity supported Standing balance-Leahy Scale: Fair                              Cognition Arousal/Alertness: Awake/alert  Behavior During Therapy:  Flat affect (smiling intermittently) Overall Cognitive Status: Impaired/Different from baseline Area of Impairment: Attention, Memory, Following commands, Safety/judgement, Awareness, Problem solving                   Current Attention Level: Sustained Memory: Decreased short-term memory Following Commands: Follows one step commands inconsistently, Follows one step commands with increased time Safety/Judgement: Decreased awareness of safety, Decreased awareness of deficits Awareness: Intellectual Problem Solving: Slow processing, Decreased initiation, Difficulty sequencing, Requires verbal cues, Requires tactile cues General Comments: Unable to recall room number when cued 3 times. On 4th attempt later in session pt recalls 1 and 5 but had difficulty stating "fifteen." Slow processing, requires >5 seconds at times but able to answer with enough time.        Exercises General Exercises - Lower Extremity Ankle Circles/Pumps: AROM, Both, 10 reps, Seated Gluteal Sets: Strengthening, Both, 20 reps, Seated Long Arc Quad: Strengthening, Both, 15 reps, Seated Hip ABduction/ADduction: Strengthening, Both, 15 reps, Seated Hip Flexion/Marching: Strengthening, Both, 10 reps, Seated    General Comments General comments (skin integrity, edema, etc.): VSS      Pertinent Vitals/Pain Pain Assessment Pain Assessment: Faces Faces Pain Scale: Hurts a little bit Pain Location: Head Pain Descriptors / Indicators: Dull Pain Intervention(s): Monitored during session, Repositioned    Home Living                          Prior Function            PT Goals (current goals can now be found in the care plan section) Acute Rehab PT Goals Patient Stated Goal: pt agreeable to session; wife reports pt has been wanting to get up and walk PT Goal Formulation: With patient/family Time For Goal Achievement: 10/21/21 Potential to Achieve Goals: Good Progress towards PT goals: Progressing  toward goals    Frequency    Min 4X/week      PT Plan Current plan remains appropriate    Co-evaluation              AM-PAC PT "6 Clicks" Mobility   Outcome Measure  Help needed turning from your back to your side while in a flat bed without using bedrails?: A Little Help needed moving from lying on your back to sitting on the side of a flat bed without using bedrails?: A Lot Help needed moving to and from a bed to a chair (including a wheelchair)?: A Lot Help needed standing up from a chair using your arms (e.g., wheelchair or bedside chair)?: A Little Help needed to walk in hospital room?: A Little Help needed climbing 3-5 steps with a railing? : Total 6 Click Score: 14    End of Session Equipment Utilized During Treatment: Gait belt Activity Tolerance: Patient tolerated treatment well Patient left: in chair;with call bell/phone within reach;with family/visitor present   PT Visit Diagnosis: Unsteadiness on feet (R26.81);Other abnormalities of gait and mobility (R26.89);Muscle weakness (generalized) (M62.81);Difficulty in walking, not elsewhere classified (R26.2);Other symptoms and signs involving the nervous system (R29.898);Hemiplegia and hemiparesis Hemiplegia - Right/Left: Right Hemiplegia - dominant/non-dominant: Dominant Hemiplegia - caused by: Nontraumatic intracerebral hemorrhage     Time: 1459-1517 PT Time Calculation (min) (ACUTE ONLY): 18 min  Charges:  $Gait Training: 8-22 mins                     Candie Mile, PT    Ellouise Newer 10/11/2021, 3:39 PM

## 2021-10-12 ENCOUNTER — Other Ambulatory Visit: Payer: Self-pay

## 2021-10-12 DIAGNOSIS — M1A9XX0 Chronic gout, unspecified, without tophus (tophi): Secondary | ICD-10-CM | POA: Diagnosis not present

## 2021-10-12 DIAGNOSIS — G4733 Obstructive sleep apnea (adult) (pediatric): Secondary | ICD-10-CM

## 2021-10-12 DIAGNOSIS — S065XAA Traumatic subdural hemorrhage with loss of consciousness status unknown, initial encounter: Secondary | ICD-10-CM | POA: Diagnosis not present

## 2021-10-12 DIAGNOSIS — Z9889 Other specified postprocedural states: Secondary | ICD-10-CM | POA: Diagnosis not present

## 2021-10-12 DIAGNOSIS — N186 End stage renal disease: Secondary | ICD-10-CM | POA: Diagnosis not present

## 2021-10-12 LAB — GLUCOSE, CAPILLARY
Glucose-Capillary: 138 mg/dL — ABNORMAL HIGH (ref 70–99)
Glucose-Capillary: 131 mg/dL — ABNORMAL HIGH (ref 70–99)
Glucose-Capillary: 116 mg/dL — ABNORMAL HIGH (ref 70–99)
Glucose-Capillary: 93 mg/dL (ref 70–99)
Glucose-Capillary: 110 mg/dL — ABNORMAL HIGH (ref 70–99)

## 2021-10-12 LAB — RENAL FUNCTION PANEL
Albumin: 2.6 g/dL — ABNORMAL LOW (ref 3.5–5.0)
Anion gap: 16 — ABNORMAL HIGH (ref 5–15)
BUN: 90 mg/dL — ABNORMAL HIGH (ref 6–20)
CO2: 27 mmol/L (ref 22–32)
Calcium: 9.4 mg/dL (ref 8.9–10.3)
Chloride: 99 mmol/L (ref 98–111)
Creatinine, Ser: 13.81 mg/dL — ABNORMAL HIGH (ref 0.61–1.24)
GFR, Estimated: 4 mL/min — ABNORMAL LOW
Glucose, Bld: 119 mg/dL — ABNORMAL HIGH (ref 70–99)
Phosphorus: 8.6 mg/dL — ABNORMAL HIGH (ref 2.5–4.6)
Potassium: 4.1 mmol/L (ref 3.5–5.1)
Sodium: 142 mmol/L (ref 135–145)

## 2021-10-12 NOTE — Progress Notes (Signed)
Inpatient Rehab Admissions Coordinator:   Met with patient and family at bedside to let them know insurance authorization has been started and reviewed benefits. Will continue to follow for potential admit to AIR.  Rehab Admissons Coordinator Kristyn Conetta, PT, GCS [phone]    

## 2021-10-12 NOTE — Progress Notes (Signed)
Patient ID: Daniel Solomon, male   DOB: 1961/01/15, 61 y.o.   MRN: [id number] Subjective: Patient reports no headache.  He is talking much better.  Objective: Vital signs in last 24 hours: Temp:  [98 F (36.7 C)-98.6 F (37 C)] 98.1 F (36.7 C) (08/15 0711) Pulse Rate:  [60-80] 60 (08/15 0711) Resp:  [16-18] 16 (08/15 0711) BP: (131-155)/(70-99) 131/73 (08/15 0711) SpO2:  [91 %-99 %] 98 % (08/15 0711)  Intake/Output from previous day: 08/14 0701 - 08/15 0700 In: 859.8 [P.O.:120; NG/GT:739.8] Out: -  Intake/Output this shift: No intake/output data recorded.  Aphasia today.  No weakness.  Moves all extremities.  He is awake and alert.  Incision is good.  Lab Results: Lab Results  Component Value Date   WBC 10.1 10/11/2021   HGB 8.5 (L) 10/11/2021   HCT 24.8 (L) 10/11/2021   MCV 97.6 10/11/2021   PLT 117 (L) 10/11/2021   Lab Results  Component Value Date   INR 1.5 (H) 10/08/2021   BMET Lab Results  Component Value Date   NA 142 10/11/2021   K 4.0 10/11/2021   CL 101 10/11/2021   CO2 23 10/11/2021   GLUCOSE 134 (H) 10/11/2021   BUN 87 (H) 10/11/2021   CREATININE 14.34 (H) 10/11/2021   CALCIUM 9.5 10/11/2021    Studies/Results: CT HEAD WO CONTRAST (5MM)  Result Date: 10/11/2021 CLINICAL DATA:  Subdural hematoma EXAM: CT HEAD WITHOUT CONTRAST TECHNIQUE: Contiguous axial images were obtained from the base of the skull through the vertex without intravenous contrast. RADIATION DOSE REDUCTION: This exam was performed according to the departmental dose-optimization program which includes automated exposure control, adjustment of the mA and/or kV according to patient size and/or use of iterative reconstruction technique. COMPARISON:  CT head 10/07/2021. FINDINGS: Brain: Overall decreased size of a left convexity subdural hemorrhage with small volume of hemorrhage tracking left falx. Hemorrhage measures up to 12 mm in thickness anteriorly. Left-sided cranioplasty. Decreased  midline shift, now 6 mm. No evidence of acute large vascular territory infarct, hydrocephalus, or mass lesion. Vascular: No hyperdense vessel identified. Embolization material in the region of the left middle meningeal artery. Skull: Left-sided cranioplasty with overlying scalp swelling and gas. Sinuses/Orbits: Largely clear sinuses.  Neck overall findings. Other: No mastoid effusions. IMPRESSION: Left cranioplasty with decreased size of a left subdural convexity hemorrhage measuring up to 12 mm in thickness anteriorly. Decreased midline shift, now 6 mm. Electronically Signed   By: Margaretha Sheffield M.D.   On: 10/11/2021 14:09    Assessment/Plan: Much improved.  Continue hemodialysis.  Mobilize with therapy.  Hopefully he will pass a swallowing study and we can get the Dobbhoff tube out.  Estimated body mass index is 30.17 kg/m as calculated from the following:   Height as of this encounter: '5\' 8"'$  (1.727 m).   Weight as of this encounter: 90 kg.    LOS: 7 days    Daniel Solomon 10/12/2021, 8:23 AM

## 2021-10-12 NOTE — Progress Notes (Signed)
Patient advanced to Dysphagia 2/ Thin liquids by SLP. Patient with questions about not having tube feeds during the day. This RN stated it would be a good idea to discuss with Dietician and the primary team about nocturnal tube feeds, where feeds are at night and then able to eat during the day.  Will pass along to oncoming nurse to pass along to day team and nurse. Per patient request, tube feeds were turned off at 1400 for him to ambulate, work with SLP and try to eat.  Will resume tube feeds once patient completes with dinner.

## 2021-10-12 NOTE — Progress Notes (Signed)
Lab came to draw labs but pt was still on peritoneal dialysis. Lab tech states labs are not to be drawn until 2 hrs post PD. Labs rescheduled for noon.

## 2021-10-12 NOTE — Progress Notes (Signed)
Pt refuses CPAP 

## 2021-10-12 NOTE — Progress Notes (Signed)
Nutrition Follow-up  DOCUMENTATION CODES:  Not applicable  INTERVENTION:  Continue TF via Cortrak tube: Osmolite 1.5 at 65 ml/h (1560 ml per day) Prosource TF20 60 ml BID Provides 2420 kcal, 137 gm protein, 1185 ml free water daily Advance diet as able per SLP, when tolerating consistent intake, will adjust feeds to nocturnal prior to discontinuation of feeds to avoid having to re-insert. If long term enteral nutrition is needed, will need to discuss with nephrology as pt is on PD and would place him at high risk for an infection if g-tube was placed. May need to consider HD if this was needed in the future  NUTRITION DIAGNOSIS:  Increased nutrient needs related to post-op healing as evidenced by estimated needs. - remains applicable  GOAL:  Patient will meet greater than or equal to 90% of their needs - TF infusing, diet advanced  MONITOR:  TF tolerance  REASON FOR ASSESSMENT:  Consult Enteral/tube feeding initiation and management  ASSESSMENT:  Pt with PMH of ESRD on PD, CAD, HLD, Afib, SDH, HTN, and OSA admitted with known subacute chronic SDH s/p middle meningeal artery embolism 8/1 admitted 8/8 for enlarging SDH.   8/8 s/p crani 8/9 extubated 8/10 s/p redo crani due to 15 mm midline shift  8/11 extubated 8/13 SLP evaluation, advanced to DYS1 with nectars  Pt resting in bed, not very conversant or engaged. Mother at bedside. TF infusing at goal via cortrak. SLP unable to work with pt yesterday and has not been by yet today. Currently on DYS 1 diet with nectar thick liquids with poor intake. Would not recommend pulling cortrak tube until pt has consistent intake of meals and diet has been upgraded.   Noted that CIR has started authorization for pt.  Average Meal Intake: 8/14: 10% intake x 1 recorded meals  Nutritionally Relevant Medications: Scheduled Meds:  atorvastatin  40 mg Per Tube Daily   calcitRIOL  0.25 mcg Per Tube Daily   PROSource TF 20  60 mL Per Tube  BID   ferric citrate  630 mg Oral TID WC   pantoprazole  40 mg Per Tube Daily   sennosides  5 mL Per Tube BID   Continuous Infusions:  dialysis solution 1.5% low-MG/low-CA     feeding supplement (OSMOLITE 1.5 CAL) 65 mL/hr at 10/12/21 0751   Labs Reviewed  NUTRITION - FOCUSED PHYSICAL EXAM: Flowsheet Row Most Recent Value  Orbital Region Unable to assess  Upper Arm Region No depletion  Thoracic and Lumbar Region No depletion  Buccal Region No depletion  Temple Region No depletion  Clavicle Bone Region No depletion  Clavicle and Acromion Bone Region No depletion  Scapular Bone Region No depletion  Dorsal Hand No depletion  Patellar Region No depletion  Anterior Thigh Region No depletion  Posterior Calf Region No depletion  Edema (RD Assessment) None  Hair Unable to assess  Eyes Unable to assess  Mouth Reviewed  Skin Reviewed  Nails Reviewed    Diet Order:   Diet Order             DIET - DYS 1 Room service appropriate? Yes with Assist; Fluid consistency: Nectar Thick  Diet effective now                   EDUCATION NEEDS:   No education needs have been identified at this time  Skin:  Skin Assessment: Reviewed RN Assessment  Last BM:  8/14 - type 6  Height:  Ht Readings from Last  1 Encounters:  10/05/21 '5\' 8"'$  (1.727 m)    Weight:  Wt Readings from Last 1 Encounters:  10/11/21 90 kg    BMI:  Body mass index is 30.17 kg/m.  Estimated Nutritional Needs:  Kcal:  2300-2500 Protein:  120-140 grams Fluid:  >1.2 L/day   Ranell Patrick, RD, LDN Clinical Dietitian RD pager # available in [facility]  After hours/weekend pager # available in [facility]

## 2021-10-12 NOTE — Progress Notes (Signed)
Physical Therapy Treatment Patient Details Name: Daniel Solomon MRN: [id number] DOB: Nov 20, 1960 Today's Date: 10/12/2021   History of Present Illness Pt is a 61 y.o. male who presented 10/05/21 with repeat head CT showing an increasing amount of subdural blood and L to R shift. 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 S/p craniotomy hematoma evacuation subdural 8/8. S/p craniectomy for Recurrent Intracranial Hematoma 8/10. PMH: anemia, bicuspid aortic valve, CAD, enlarged aorta, ESRD on peritoneal dialysis, glaucoma, gout, hypercholesterolemia, HTN, mild aortic stenosis, PAF, s/p TAVR, SDH, sleep apnea.    PT Comments    Continues to progress towards acute functional goals. Better recall today, more oriented to unit and room. Min assist intermittently for balance with ambulation. Still with delayed processing. Able to read poster titles, room numbers, and identify objects in hallway, but has to stop and focus, currently unable to dual-task to a higher degree. Patient will continue to benefit from skilled physical therapy services to further improve independence with functional mobility.    Recommendations for follow up therapy are one component of a multi-disciplinary discharge planning process, led by the attending physician.  Recommendations may be updated based on patient status, additional functional criteria and insurance authorization.  Follow Up Recommendations  Acute inpatient rehab (3hours/day)     Assistance Recommended at Discharge Frequent or constant Supervision/Assistance  Patient can return home with the following A lot of help with bathing/dressing/bathroom;Assistance with cooking/housework;Direct supervision/assist for medications management;Direct supervision/assist for financial management;Assist for transportation;Help with stairs or ramp for entrance;A little help with walking and/or transfers   Equipment Recommendations  Rolling walker (2  wheels);BSC/3in1    Recommendations for Other Services       Precautions / Restrictions Precautions Precautions: Fall Precaution Comments: No flap - Helmet Required Braces or Orthoses: Other Brace Other Brace: Helmet Restrictions Weight Bearing Restrictions: No     Mobility  Bed Mobility               General bed mobility comments: In recliner    Transfers Overall transfer level: Needs assistance Equipment used: Rolling walker (2 wheels) Transfers: Sit to/from Stand Sit to Stand: Min guard           General transfer comment: Min guard for safety, assist with lines and leads only. Cues for technique. Stood quickly    Ambulation/Gait Ambulation/Gait assistance: Herbalist (Feet): 260 Feet Assistive device: Rolling walker (2 wheels) Gait Pattern/deviations: Decreased step length - right, Decreased dorsiflexion - right, Trunk flexed, Step-through pattern, Drifts right/left, Leaning posteriorly Gait velocity: reduced Gait velocity interpretation: <1.8 ft/sec, indicate of risk for recurrent falls   General Gait Details: Required min assist on 2 occasions for posterior instability and balance. Cues for safety, awareness, and intermittent forward gaze (fixed on floor majority of early walk but improved.) Light assist with RW during congested turn. VC for directions around unit and able to recall room on return. Had pt turn head and identify objects, read room and posters, pt required to stop and read ea one, unable to walk continuously and read.   Stairs             Wheelchair Mobility    Modified Rankin (Stroke Patients Only) Modified Rankin (Stroke Patients Only) Pre-Morbid Rankin Score: No symptoms Modified Rankin: Moderately severe disability     Balance Overall balance assessment: Needs assistance Sitting-balance support: No upper extremity supported, Feet supported Sitting balance-Leahy Scale: Good     Standing balance support:  During  functional activity, No upper extremity supported Standing balance-Leahy Scale: Fair                              Cognition Arousal/Alertness: Awake/alert Behavior During Therapy: Flat affect Overall Cognitive Status: Impaired/Different from baseline Area of Impairment: Memory, Following commands, Safety/judgement, Awareness, Problem solving                     Memory: Decreased short-term memory Following Commands: Follows one step commands inconsistently, Follows one step commands with increased time Safety/Judgement: Decreased awareness of safety, Decreased awareness of deficits Awareness: Emergent Problem Solving: Slow processing, Decreased initiation, Difficulty sequencing, Requires verbal cues, Requires tactile cues General Comments: Recalled room number (not floor or wing) more oriented to unit and able to navigate back to room after walking around without VC.        Exercises      General Comments        Pertinent Vitals/Pain Pain Assessment Pain Assessment: No/denies pain Pain Intervention(s): Monitored during session    Home Living                          Prior Function            PT Goals (current goals can now be found in the care plan section) Acute Rehab PT Goals Patient Stated Goal: pt agreeable to session; PT Goal Formulation: With patient/family Time For Goal Achievement: 10/21/21 Potential to Achieve Goals: Good Progress towards PT goals: Progressing toward goals    Frequency    Min 4X/week      PT Plan Current plan remains appropriate    Co-evaluation              AM-PAC PT "6 Clicks" Mobility   Outcome Measure  Help needed turning from your back to your side while in a flat bed without using bedrails?: A Little Help needed moving from lying on your back to sitting on the side of a flat bed without using bedrails?: A Lot Help needed moving to and from a bed to a chair (including a  wheelchair)?: A Lot Help needed standing up from a chair using your arms (e.g., wheelchair or bedside chair)?: A Little Help needed to walk in hospital room?: A Little Help needed climbing 3-5 steps with a railing? : A Lot 6 Click Score: 15    End of Session Equipment Utilized During Treatment: Gait belt Activity Tolerance: Patient tolerated treatment well Patient left: in chair;with call bell/phone within reach;with family/visitor present   PT Visit Diagnosis: Unsteadiness on feet (R26.81);Other abnormalities of gait and mobility (R26.89);Muscle weakness (generalized) (M62.81);Difficulty in walking, not elsewhere classified (R26.2);Other symptoms and signs involving the nervous system (R29.898);Hemiplegia and hemiparesis Hemiplegia - Right/Left: Right Hemiplegia - dominant/non-dominant: Dominant Hemiplegia - caused by: Nontraumatic intracerebral hemorrhage     Time: 1024-1040 PT Time Calculation (min) (ACUTE ONLY): 16 min  Charges:  $Gait Training: 8-22 mins                     Candie Mile, PT    Ellouise Newer 10/12/2021, 11:59 AM

## 2021-10-12 NOTE — Plan of Care (Signed)
  Problem: Education: Goal: Knowledge of the prescribed therapeutic regimen will improve Outcome: Progressing   Problem: Clinical Measurements: Goal: Neurologic status will improve Outcome: Progressing   Problem: Clinical Measurements: Goal: Usual level of consciousness will be regained or maintained. Outcome: Progressing   Problem: Activity: Goal: Ability to tolerate increased activity will improve Outcome: Progressing   Problem: Education: Goal: Knowledge of General Education information will improve Description: Including pain rating scale, medication(s)/side effects and non-pharmacologic comfort measures Outcome: Progressing   Problem: Activity: Goal: Risk for activity intolerance will decrease Outcome: Progressing   Problem: Nutrition: Goal: Adequate nutrition will be maintained Outcome: Progressing   Problem: Coping: Goal: Level of anxiety will decrease Outcome: Progressing   Problem: Pain Managment: Goal: General experience of comfort will improve Outcome: Progressing

## 2021-10-12 NOTE — Progress Notes (Signed)
[facility] Progress Note  Subjective: no acute events. Patient seen and examined in room. Wife at bedside, she reports that PD went much better since he was in the chair.  Vitals:   10/11/21 1200 10/11/21 2229 10/12/21 0441 10/12/21 0711  BP: (!) 155/99 131/70 (!) 141/76 131/73  Pulse: 72 77 74 60  Resp: '16 18 16 16  '$ Temp:  98.6 F (37 C) 98.1 F (36.7 C) 98.1 F (36.7 C)  TempSrc:  Oral Oral Oral  SpO2: 91% 99% 99% 98%  Weight:      Height:        Exam: Gen: NAD, sitting up on edge of bed HEENT: +NGT CV: RRR Resp: CTA bl Abd: soft, NT Neuro: awake, alert Dialysis access: RLQ PD  Recent Labs  Lab 10/10/21 0128 10/11/21 0714  HGB 8.0* 8.5*  ALBUMIN 2.4* 2.7*  CALCIUM 8.9 9.5  PHOS 7.4* 8.7*  CREATININE 14.57* 14.34*  K 3.6 4.0   Recent Labs  Lab 10/06/21 1042 10/10/21 1608  IRON 79 70  TIBC 150* 154*  FERRITIN  --  1,449*   Inpatient medications:  allopurinol  100 mg Per Tube Daily   atorvastatin  40 mg Per Tube Daily   calcitRIOL  0.25 mcg Per Tube Daily   Chlorhexidine Gluconate Cloth  6 each Topical Daily   darbepoetin (ARANESP) injection - NON-DIALYSIS  60 mcg Subcutaneous Q Sun-1800   feeding supplement (PROSource TF20)  60 mL Per Tube BID   ferric citrate  630 mg Oral TID WC   gentamicin cream  1 Application Topical Daily   labetalol  100 mg Per Tube BID   levETIRAcetam  500 mg Per Tube BID   pantoprazole  40 mg Per Tube Daily   sennosides  5 mL Per Tube BID    sodium chloride 10 mL/hr at 10/05/21 1718   dialysis solution 1.5% low-MG/low-CA     feeding supplement (OSMOLITE 1.5 CAL) 65 mL/hr at 10/12/21 0751   acetaminophen **OR** acetaminophen, bisacodyl, heparin 2,500 Units in dialysis solution 1.5% low-MG/low-CA 5,000 mL dialysis solution, HYDROcodone-acetaminophen, labetalol, naLOXone (NARCAN)  injection, ondansetron **OR** ondansetron (ZOFRAN) IV   OP PD: CCPD [facility] 7d /wk 93kg  5 overnight, 3000 fill, last fill 2500 for  daybag - last Hb 11.6 on 7/7, tsat 14%, ferr 754 - no esa or IV fe currently    Assessment/ Plan: SDH w/ brain compression - sp SDH evac and craniotomy 8/8. Went back to OR 8/11 for hematoma evacuation.  Doing much better w/ mentation. Per NSGY.   PAF - holding eliquis for now, per cards/ NSGY ESRD - on CCPD. Continue nightly CCPD-1.5% bags CAD hx PCI / sp TAVR HTN - on home labetalol 100 bid. torsemide '100mg'$  qd on hold in the context of being under edw Volume - euvolemic on exam, under dry wt. Use all 1.5% w/ PD. 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 Started on esa w/ darbe 60 mcg weekly 8/13. fe studies: iron replete. Transfuse prn.  MBD ckd - CCa in range, phos is high. Cont daily vdra, and cont auryxia as binder.

## 2021-10-12 NOTE — Progress Notes (Signed)
Speech Language Pathology Treatment: Dysphagia  Patient Details Name: Daniel Solomon MRN: [id number] DOB: 1960-03-28 Today's Date: 10/12/2021 Time: 1500-1530 SLP Time Calculation (min) (ACUTE ONLY): 30 min  Assessment / Plan / Recommendation Clinical Impression  Pt seen at bedside for skilled ST intervention targeting goal for tolerance of least restrictive diet. Pt was seated in recliner, awake and alert. Pt's wife and mother were present. Pt completed oral care after set up. After oral care, pt accepted trials of ice chips, thin liquid, and solid textures. Pt tolerated all consistencies without overt s/s aspiration. Recommend advancing diet to dys 2 (finely chopped, primarily for energy conservation) with thin liquids, meds whole in puree. Safe swallow precautions reviewed and posted at [facility]. RN and MD informed. SLP will continue to follow to assess diet tolerance and determine readiness to advance solids further, as well as continue treatment for aphasia and apraxia.    HPI HPI: Pt is a 61 y.o. male who presented 10/05/21 with repeat head CT showing an increasing amount of subdural blood and L to R shift. 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 S/p craniotomy hematoma evacuation subdural 8/8. S/p craniectomy for Recurrent Intracranial Hematoma 8/10. Intubated briefly x2 for surgeries. PMH: anemia, bicuspid aortic valve, CAD, enlarged aorta, ESRD on peritoneal dialysis, glaucoma, gout, hypercholesterolemia, HTN, mild aortic stenosis, PAF, s/p TAVR, SDH, sleep apnea.      SLP Plan  Continue with current plan of care;Goals updated      Recommendations for follow up therapy are one component of a multi-disciplinary discharge planning process, led by the attending physician.  Recommendations may be updated based on patient status, additional functional criteria and insurance authorization.    Recommendations  Diet recommendations: Dysphagia 2 (fine chop);Thin  liquid Liquids provided via: Cup;Straw Medication Administration: Whole meds with puree Supervision: Patient able to self feed;Full supervision/cueing for compensatory strategies;Staff to assist with self feeding Compensations: Minimize environmental distractions;Slow rate;Small sips/bites Postural Changes and/or Swallow Maneuvers: Seated upright 90 degrees;Upright 30-60 min after meal                Oral Care Recommendations: Oral care BID Follow Up Recommendations: Acute inpatient rehab (3hours/day) Assistance recommended at discharge: Frequent or constant Supervision/Assistance SLP Visit Diagnosis: Dysphagia, unspecified (R13.10) Plan: Continue with current plan of care;Goals updated          Eletha Culbertson B. Quentin Ore, [facility], [place] Speech Language Pathologist Office: [phone]  Shonna Chock 10/12/2021, 3:43 PM

## 2021-10-12 NOTE — Progress Notes (Addendum)
PROGRESS NOTE    Daniel Solomon  JQB:[id number] DOB: [birth year] DOA: 10/05/2021 PCP: Marin Olp, MD    Brief Narrative:   Patient is is a is 61 years old male with past medical history of known subacute/chronic left subdural hematoma, ESRD, CAD, hyperlipidemia, aortic stenosis, paroxysmal atrial fibrillation, hypertension, obstructive sleep apnea, gout who was treated with middle meningeal artery embolism on 09/28/2021 by neurosurgery presented to neurosurgery service with increasing subdural hematoma with left to right shunt.  He did not complain of headache but was then admitted to the hospital for further evaluation and treatment.  Neurosurgery recommended operative evacuation/craniotomy.  Patient underwent a craniotomy initially on 8/ 8/23 and was put on ventilator and admitted to the ICU.  On 10/06/2021 patient was extubated.  On 10/07/2021 patient was taken to the OR for redo craniotomy given increased shift on the head CT scan.  Patient was subsequently extubated on 10/08/21 and was subsequently transferred out of the ICU.  Medical team was notified for medical co-management.   Assessment and Plan:  Principal Problem:   S/P craniotomy Active Problems:   OSA (obstructive sleep apnea)   HTN (hypertension)   Gout   ESRD on peritoneal dialysis ([name])   Acute subdural hematoma ([name])   Left subacute subdural hematoma with brain compression.   Status post craniotomy on 10/05/21, then redo on 10/07/21.  Needed mechanical ventilation and intubation and ICU support.  Neurosurgery on board.  Continue Keppra.  Systolic blood pressure goal less than 140.  On as needed labetalol.  Continue torsemide.  Patient is on labetalol 100 mg twice a day at home.  Could consider labetalol through the PEG tube  Dysphagia, poor oral intake, patient is currently on cortrak tube tube.  On dysphagia 1 diet.  Speech therapy following.  Follow recommendation.  Acute metabolic encephalopathy secondary to uremia,  narcotics, untreated OSA with brain compression from SDH Continue to minimize narcotics and sedatives.  On peritoneal dialysis.  Nephrology on board.  Continue to monitor.  OSA> unable to use hospital masks due to craniotomy. Consider CPAP when safe with craniotomy.   Paroxysmal atrial fibrillation, previously on Eliquis. Eliquis on hold at this time.  As needed labetalol.   CAD/Aortic stenosis, s/p TAVR Continue labetalol as needed, torsemide.   Hypertension Continue labetalol as needed.  Switch to oral labetalol twice a day when oral established.  Blood pressure reasonably controlled.   Hyperlipidemia Continue Lipitor.   ESRD on Peritoneal dialysis, Nephrology on board.  On nocturnal peritoneal dialysis.  Gout -Continue allopurinol as ordered   DVT prophylaxis: SCDs Start: 10/05/21 2221  Code Status:     Code Status: Full Code  Disposition: CIR  Status is: Inpatient  Remains inpatient appropriate because: Status post subdural hematoma, need for CIR.   Family Communication: Spoke with the patient's wife at bedside.  Consultants:  Neurosurgery Critical care  Procedures:  Craniotomy with evacuation of subdural hematoma x2 Cortrak tube tube placement.  Antimicrobials:  None  Anti-infectives (From admission, onward)    Start     Dose/Rate Route Frequency Ordered Stop   10/05/21 1728  ceFAZolin (ANCEF) 2-4 GM/100ML-% IVPB       Note to Pharmacy: Ladoris Gene A: cabinet override      10/05/21 1728 10/05/21 1844      Subjective: Today, patient was seen and examined at bedside.  Patient complains of a headache and still has trouble swallowing.  On cortrak tube tube.  No nausea vomiting today but had some  nausea vomiting yesterday.  Has poor intake..  Objective: Vitals:   10/11/21 2229 10/12/21 0441 10/12/21 0711 10/12/21 1121  BP: 131/70 (!) 141/76 131/73 (!) 141/77  Pulse: 77 74 60 64  Resp: '18 16 16 16  '$ Temp: 98.6 F (37 C) 98.1 F (36.7 C) 98.1 F (36.7  C) 97.7 F (36.5 C)  TempSrc: Oral Oral Oral Oral  SpO2: 99% 99% 98% 99%  Weight:      Height:        Intake/Output Summary (Last 24 hours) at 10/12/2021 1158 Last data filed at 10/12/2021 0751 Gross per 24 hour  Intake 739.83 ml  Output --  Net 739.83 ml   Filed Weights   10/07/21 1142 10/08/21 1500 10/11/21 0500  Weight: 86.3 kg 87.2 kg 90 kg    Physical Examination: Body mass index is 30.17 kg/m.   General: Obese built, not in obvious distress HENT:   No scleral pallor or icterus noted. Oral mucosa is moist.  Cortrak tube tube in place Chest:   Diminished breath sounds bilaterally. No crackles or wheezes.  CVS: S1 &S2 heard. No murmur.  Regular rate and rhythm. Abdomen: Soft, nontender, nondistended.  Bowel sounds are heard.   Extremities: No cyanosis, clubbing or edema.  Peripheral pulses are palpable. Psych: Alert, awake and oriented, normal mood CNS:  No cranial nerve deficits.  Moves all extremities.  Scalp With staples Skin: Warm and dry.  Incision site with staples in the scalp appeared healthy.  Data Reviewed:   CBC: Recent Labs  Lab 10/05/21 1250 10/05/21 2255 10/05/21 2304 10/07/21 0249 10/07/21 2004 10/07/21 2137 10/08/21 0202 10/09/21 0547 10/10/21 0128 10/11/21 0714  WBC 7.1  --  6.1 9.5  --   --  8.8 9.2 8.9 10.1  NEUTROABS 5.2  --  5.4  --   --   --   --   --   --   --   HGB 11.0*   < > 8.7* 8.1*   < > 8.2* 9.3* 8.6* 8.0* 8.5*  HCT 32.9*   < > 26.4* 24.2*   < > 24.0* 27.0* 26.1* 23.2* 24.8*  MCV 103.1*  --  102.7* 102.5*  --   --  97.5 100.4* 97.9 97.6  PLT 121*  --  102* 103*  --   --  96* 86* 102* 117*   < > = values in this interval not displayed.    Basic Metabolic Panel: Recent Labs  Lab 10/06/21 1042 10/07/21 0249 10/07/21 1136 10/07/21 2004 10/07/21 2137 10/08/21 0202 10/09/21 0547 10/10/21 0128 10/11/21 0714  NA  --    < > 137   < > 134* 137 140 142 142  K  --    < > 4.7   < > 5.4* 4.5 4.4 3.6 4.0  CL  --    < > 100  --    --  102 103 103 101  CO2  --    < > 21*  --   --  18* 21* 23 23  GLUCOSE  --    < > 106*  --   --  116* 119* 137* 134*  BUN  --    < > 97*  --   --  96* 86* 85* 87*  CREATININE  --    < > 17.36*  --   --  16.34* 15.22* 14.57* 14.34*  CALCIUM  --    < > 9.5  --   --  8.6* 8.8* 8.9 9.5  MG  --   --   --   --   --   --  2.3 2.3 2.3  PHOS 9.2*  --   --   --   --   --  7.6* 7.4* 8.7*   < > = values in this interval not displayed.    Liver Function Tests: Recent Labs  Lab 10/06/21 1042 10/07/21 0249 10/10/21 0128 10/11/21 0714  AST  --  16  --   --   ALT  --  10  --   --   ALKPHOS  --  50  --   --   BILITOT  --  0.5  --   --   PROT  --  5.1*  --   --   ALBUMIN 3.2* 2.8* 2.4* 2.7*     Radiology Studies: CT HEAD WO CONTRAST (5MM)  Result Date: 10/11/2021 CLINICAL DATA:  Subdural hematoma EXAM: CT HEAD WITHOUT CONTRAST TECHNIQUE: Contiguous axial images were obtained from the base of the skull through the vertex without intravenous contrast. RADIATION DOSE REDUCTION: This exam was performed according to the departmental dose-optimization program which includes automated exposure control, adjustment of the mA and/or kV according to patient size and/or use of iterative reconstruction technique. COMPARISON:  CT head 10/07/2021. FINDINGS: Brain: Overall decreased size of a left convexity subdural hemorrhage with small volume of hemorrhage tracking left falx. Hemorrhage measures up to 12 mm in thickness anteriorly. Left-sided cranioplasty. Decreased midline shift, now 6 mm. No evidence of acute large vascular territory infarct, hydrocephalus, or mass lesion. Vascular: No hyperdense vessel identified. Embolization material in the region of the left middle meningeal artery. Skull: Left-sided cranioplasty with overlying scalp swelling and gas. Sinuses/Orbits: Largely clear sinuses.  Neck overall findings. Other: No mastoid effusions. IMPRESSION: Left cranioplasty with decreased size of a left subdural  convexity hemorrhage measuring up to 12 mm in thickness anteriorly. Decreased midline shift, now 6 mm. Electronically Signed   By: Margaretha Sheffield M.D.   On: 10/11/2021 14:09      LOS: 7 days    Flora Lipps, MD [facility] Available via Epic secure chat 7am-7pm After these hours, please refer to coverage provider listed on [email or web address] 10/12/2021, 11:58 AM

## 2021-10-13 DIAGNOSIS — M1A9XX0 Chronic gout, unspecified, without tophus (tophi): Secondary | ICD-10-CM | POA: Diagnosis not present

## 2021-10-13 DIAGNOSIS — S065XAA Traumatic subdural hemorrhage with loss of consciousness status unknown, initial encounter: Secondary | ICD-10-CM | POA: Diagnosis not present

## 2021-10-13 DIAGNOSIS — Z9889 Other specified postprocedural states: Secondary | ICD-10-CM | POA: Diagnosis not present

## 2021-10-13 DIAGNOSIS — N186 End stage renal disease: Secondary | ICD-10-CM | POA: Diagnosis not present

## 2021-10-13 LAB — BASIC METABOLIC PANEL
Anion gap: 17 — ABNORMAL HIGH (ref 5–15)
BUN: 94 mg/dL — ABNORMAL HIGH (ref 6–20)
CO2: 25 mmol/L (ref 22–32)
Calcium: 9.5 mg/dL (ref 8.9–10.3)
Chloride: 97 mmol/L — ABNORMAL LOW (ref 98–111)
Creatinine, Ser: 13.51 mg/dL — ABNORMAL HIGH (ref 0.61–1.24)
GFR, Estimated: 4 mL/min — ABNORMAL LOW (ref >60)
Glucose, Bld: 151 mg/dL — ABNORMAL HIGH (ref 70–99)
Potassium: 4 mmol/L (ref 3.5–5.1)
Sodium: 139 mmol/L (ref 135–145)

## 2021-10-13 LAB — CBC
HCT: 24.2 % — ABNORMAL LOW (ref 39.0–52.0)
Hemoglobin: 8.1 g/dL — ABNORMAL LOW (ref 13.0–17.0)
MCH: 33.5 pg (ref 26.0–34.0)
MCHC: 33.5 g/dL (ref 30.0–36.0)
MCV: 100 fL (ref 80.0–100.0)
Platelets: 127 10*3/uL — ABNORMAL LOW (ref 150–400)
RBC: 2.42 MIL/uL — ABNORMAL LOW (ref 4.22–5.81)
RDW: 15.2 % (ref 11.5–15.5)
WBC: 8.7 10*3/uL (ref 4.0–10.5)
nRBC: 0 % (ref 0.0–0.2)

## 2021-10-13 LAB — GLUCOSE, CAPILLARY
Glucose-Capillary: 104 mg/dL — ABNORMAL HIGH (ref 70–99)
Glucose-Capillary: 104 mg/dL — ABNORMAL HIGH (ref 70–99)
Glucose-Capillary: 122 mg/dL — ABNORMAL HIGH (ref 70–99)
Glucose-Capillary: 147 mg/dL — ABNORMAL HIGH (ref 70–99)
Glucose-Capillary: 96 mg/dL (ref 70–99)

## 2021-10-13 LAB — MAGNESIUM: Magnesium: 2.4 mg/dL (ref 1.7–2.4)

## 2021-10-13 MED ORDER — ATORVASTATIN CALCIUM 40 MG PO TABS
40.0000 mg | ORAL_TABLET | Freq: Every day | ORAL | Status: DC
  Administered 2021-10-14: 40 mg via ORAL
  Filled 2021-10-13: qty 1

## 2021-10-13 MED ORDER — PROSOURCE TF20 ENFIT COMPATIBL EN LIQD: 60.0000 mL | Freq: Every day | ENTERAL | Status: DC

## 2021-10-13 MED ORDER — CALCITRIOL 1 MCG/ML PO SOLN
0.2500 ug | Freq: Every day | ORAL | Status: DC
  Administered 2021-10-14: 0.25 ug via ORAL
  Filled 2021-10-13: qty 0.25

## 2021-10-13 MED ORDER — LABETALOL HCL 100 MG PO TABS
100.0000 mg | ORAL_TABLET | Freq: Two times a day (BID) | ORAL | Status: DC
  Administered 2021-10-13 – 2021-10-14 (×2): 100 mg via ORAL
  Filled 2021-10-13 (×2): qty 1

## 2021-10-13 MED ORDER — ALLOPURINOL 100 MG PO TABS
100.0000 mg | ORAL_TABLET | Freq: Every day | ORAL | Status: DC
  Administered 2021-10-14: 100 mg via ORAL
  Filled 2021-10-13: qty 1

## 2021-10-13 MED ORDER — FREE WATER
50.0000 mL | Freq: Three times a day (TID) | Status: DC
  Administered 2021-10-13 (×2): 50 mL

## 2021-10-13 MED ORDER — ONDANSETRON HCL 4 MG PO TABS: 4.0000 mg | ORAL_TABLET | ORAL | Status: DC | PRN

## 2021-10-13 MED ORDER — FERRIC CITRATE 1 GM 210 MG(FE) PO TABS
840.0000 mg | ORAL_TABLET | Freq: Three times a day (TID) | ORAL | Status: DC
  Administered 2021-10-14: 840 mg via ORAL
  Filled 2021-10-13 (×4): qty 4

## 2021-10-13 MED ORDER — ONDANSETRON HCL 4 MG/2ML IJ SOLN: 4.0000 mg | INTRAMUSCULAR | Status: DC | PRN

## 2021-10-13 MED ORDER — OSMOLITE 1.5 CAL PO LIQD: 1000.0000 mL | ORAL | Status: DC

## 2021-10-13 MED ORDER — TORSEMIDE 20 MG PO TABS
80.0000 mg | ORAL_TABLET | Freq: Every day | ORAL | Status: DC
  Administered 2021-10-13 – 2021-10-14 (×2): 80 mg via ORAL
  Filled 2021-10-13 (×2): qty 4

## 2021-10-13 MED ORDER — HYDROCODONE-ACETAMINOPHEN 5-325 MG PO TABS
1.0000 | ORAL_TABLET | ORAL | Status: DC | PRN
  Administered 2021-10-14 (×2): 1 via ORAL
  Filled 2021-10-13 (×2): qty 1

## 2021-10-13 MED ORDER — LEVETIRACETAM 100 MG/ML PO SOLN
500.0000 mg | Freq: Two times a day (BID) | ORAL | Status: DC
  Administered 2021-10-13 – 2021-10-14 (×2): 500 mg via ORAL
  Filled 2021-10-13 (×2): qty 5

## 2021-10-13 MED ORDER — SENNOSIDES 8.8 MG/5ML PO SYRP
5.0000 mL | ORAL_SOLUTION | Freq: Two times a day (BID) | ORAL | Status: DC
  Filled 2021-10-13 (×2): qty 5

## 2021-10-13 MED ORDER — ENSURE ENLIVE PO LIQD: 237.0000 mL | Freq: Three times a day (TID) | ORAL | Status: DC

## 2021-10-13 NOTE — Progress Notes (Addendum)
Speech Language Pathology Treatment: Dysphagia;Cognitive-Linquistic  Patient Details Name: Daniel Solomon MRN: [id number] DOB: [birth year] Today's Date: 10/13/2021 Time: 6213-0865 SLP Time Calculation (min) (ACUTE ONLY): 43 min  Assessment / Plan / Recommendation Clinical Impression  Pt seen at bedside for follow up after diet advanced to Dys2/thin liquids yesterday. Pt was seated in recliner, wife present. RN provided whole meds with puree, which pt tolerated with liquid wash. He did chew up a couple of them, but was then able to swallow them whole after verbal cues. Pt tolerated whole pills, applesauce and water without overt s/s aspiration.  Pt is anxious to have Cortrak removed.  Pt speech and language were reassessed during this session. Pt has demonstrated good improvement in his communication thus far.   Receptive Language: Pt able to answer simple and complex/abstract yes/no questions with 95% accuracy. He was able to follow one and two step verbal directions accurately, but struggled with three step directions. Pt exhibits accurate right/left discrimination with simple commands, but has difficulty with these more complex directions as well (with your right hand, point to your left eye).    Expressive Language: Pt completed automatic sequences slowly with great hesitation, frequently returning to the beginning of the list (DOW, MOY). He exhibited excellent awareness and self correction when he mispronounced an item (said "ays" for /s/, then said it correctly). Pt had increasing difficulty with single word repetition as syllables increased, and was unable to repeat a sentence. This may be due in part to expressive aphasia, but also to verbal apraxia. Pt completed phrase completion and answered responsive naming tasks without difficulty. He was able to name 5 animals in 60 seconds, but interestingly starting stating the [employer] alphabet (alpha, bravo, charlie, delta, echo, golf). He was able to  resume naming animals with a verbal cue.    Apraxia: Pt does not exhibit oral apraxia, demonstrating the ability to follow all commands given. Pt was able to repeat words of increasing length, but struggled with repeating 3-4 syllable words 3 times. Errors were inconsistent. Further skilled ST intervention would be beneficial to determine apraxic vs aphasic errors, and to maximize functional and effective communication. Pt and wife were educated regarding findings from today. Since pt is apparently not transferring to CIR, recommend home health or outpatient speech therapy upon discharge.    HPI HPI: Pt is a 61 y.o. male who presented 10/05/21 with repeat head CT showing an increasing amount of subdural blood and L to R shift. 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 S/p craniotomy hematoma evacuation subdural 8/8. S/p craniectomy for Recurrent Intracranial Hematoma 8/10. Intubated briefly x2 for surgeries. PMH: anemia, bicuspid aortic valve, CAD, enlarged aorta, ESRD on peritoneal dialysis, glaucoma, gout, hypercholesterolemia, HTN, mild aortic stenosis, PAF, s/p TAVR, SDH, sleep apnea.      SLP Plan  Continue with current plan of care      Recommendations for follow up therapy are one component of a multi-disciplinary discharge planning process, led by the attending physician.  Recommendations may be updated based on patient status, additional functional criteria and insurance authorization.    Recommendations  Diet recommendations: Dysphagia 2 (fine chop);Thin liquid Liquids provided via: Cup;Straw Medication Administration: Whole meds with puree (liquid wash) Supervision: Patient able to self feed;Full supervision/cueing for compensatory strategies;Staff to assist with self feeding Compensations: Minimize environmental distractions;Slow rate;Small sips/bites Postural Changes and/or Swallow Maneuvers: Seated upright 90 degrees;Upright 30-60 min after meal  Oral Care Recommendations: Oral care BID Follow Up Recommendations: Home health SLP Assistance recommended at discharge: Frequent or constant Supervision/Assistance SLP Visit Diagnosis: Dysphagia, unspecified (R13.10);Aphasia (R47.01);Apraxia (R48.2) Plan: Continue with current plan of care          Lilyannah Zuelke B. Quentin Ore, [facility], [place] Speech Language Pathologist Office: [phone]  Shonna Chock 10/13/2021, 1:59 PM

## 2021-10-13 NOTE — Progress Notes (Signed)
Inpatient Rehab Admissions Coordinator:   Observed pt mobilizing in hallway with spouse at supervision level.  No LOB noted, pt with good safety awareness during turns and able to locate room without cuing from spouse.  Discussed with spouse and she feels comfortable taking pt home and pt desires direct d/c home versus CIR.  Will withdraw case with [employer].  I've let Dr. Ronnald Ramp and [facility] team know.   Shann Medal, PT, DPT Admissions Coordinator [phone] 10/13/21  11:55 AM

## 2021-10-13 NOTE — Progress Notes (Signed)
PROGRESS NOTE    Daniel Solomon  ZJI:[id number] DOB: Jun 04, 1960 DOA: 10/05/2021 PCP: Marin Olp, MD    Brief Narrative:   Patient is is a is 62 years old male with past medical history of known subacute/chronic left subdural hematoma, ESRD, CAD, hyperlipidemia, aortic stenosis, paroxysmal atrial fibrillation, hypertension, obstructive sleep apnea, gout who was treated with middle meningeal artery embolism on 09/28/2021 by neurosurgery presented to neurosurgery service with increasing subdural hematoma with left to right shunt.  He did not complain of headache but was then admitted to the hospital for further evaluation and treatment.  Neurosurgery recommended operative evacuation/craniotomy.  Patient underwent a craniotomy initially on 8/ 8/23 and was put on ventilator and admitted to the ICU.  On 10/06/2021 patient was extubated.  On 10/07/2021 patient was taken to the OR for redo craniotomy given increased shift on the head CT scan.  Patient was subsequently extubated on 10/08/21 and was subsequently transferred out of the ICU.  Medical team was consulted for medical co-management.   Assessment and Plan:  Principal Problem:   S/P craniotomy Active Problems:   OSA (obstructive sleep apnea)   HTN (hypertension)   Gout   ESRD on peritoneal dialysis ([name])   Acute subdural hematoma ([name])   Left subacute subdural hematoma with brain compression.   Status post craniotomy on 10/05/21, then redo on 10/07/21.  Needed mechanical ventilation and intubation and ICU support.  Neurosurgery on board.  Continue Keppra.  Systolic blood pressure goal less than 140.  On as needed labetalol and oral labetalol. Continue torsemide.  Patient is on labetalol 100 mg twice a day at home.  Dysphagia, poor oral intake, patient is currently on cortrak tube tube.  On dysphagia 2 diet.  Speech therapy following.  Family stated that he was able to eat better and would recommend cutting down on tube feeding and stopping tube  feeding altogether if adequate intake and able to hold down.  Acute metabolic encephalopathy secondary to uremia, narcotics, untreated OSA with brain compression from SDH Improved mental status at this time.  Continue to minimize narcotics and sedatives.  On peritoneal dialysis.   OSA> unable to use hospital masks due to craniotomy. Consider CPAP when safe with craniotomy.   Paroxysmal atrial fibrillation, previously on Eliquis. Eliquis on hold at this time.  Continue labetalol  CAD/Aortic stenosis, s/p TAVR Continue labetalol, torsemide   Hypertension Continue labetalol   Hyperlipidemia Continue Lipitor.   ESRD on Peritoneal dialysis, Nephrology on board.  On nocturnal peritoneal dialysis.  Gout -Continue allopurinol as ordered   DVT prophylaxis: SCDs Start: 10/05/21 2221  Code Status:     Code Status: Full Code  Disposition: CIR  Status is: Inpatient  Remains inpatient appropriate because: Status post subdural hematoma, need for CIR.   Family Communication:  I again spoke with the patient's wife at bedside.  Consultants:  Neurosurgery Critical care  Procedures:  Craniotomy with evacuation of subdural hematoma x2 Cortrak tube tube placement.  Antimicrobials:  None  Anti-infectives (From admission, onward)    Start     Dose/Rate Route Frequency Ordered Stop   10/05/21 1728  ceFAZolin (ANCEF) 2-4 GM/100ML-% IVPB       Note to Pharmacy: Ladoris Gene A: cabinet override      10/05/21 1728 10/05/21 1844      Subjective:  Today, patient was seen and examined at bedside.  Denies any headache.  Patient's family at bedside stated that he was able to eat a little better yesterday.  Inquiring about taking out the feeding tube.  Denies any headache   Objective: Vitals:   10/13/21 0419 10/13/21 0924 10/13/21 1046 10/13/21 1224  BP: 131/71 (!) 141/82 (!) 151/131 130/75  Pulse: 70 77 77 66  Resp: '19 18 20 19  '$ Temp: 98 F (36.7 C) 97.8 F (36.6 C) 98.5 F (36.9  C) 97.9 F (36.6 C)  TempSrc: Oral Oral Oral Oral  SpO2: 97% 100% 98% 100%  Weight:      Height:        Intake/Output Summary (Last 24 hours) at 10/13/2021 1252 Last data filed at 10/13/2021 1046 Gross per 24 hour  Intake 520 ml  Output 383 ml  Net 137 ml    Filed Weights   10/07/21 1142 10/08/21 1500 10/11/21 0500  Weight: 86.3 kg 87.2 kg 90 kg    Physical Examination: Body mass index is 30.17 kg/m.   General: Obese built, not in obvious distress HENT:   No scleral pallor or icterus noted. Oral mucosa is moist.  PEG tube in place. Chest:  Clear breath sounds.  Diminished breath sounds bilaterally. No crackles or wheezes.  CVS: S1 &S2 heard. No murmur.  Regular rate and rhythm. Abdomen: Soft, nontender, nondistended.  Bowel sounds are heard.   Extremities: No cyanosis, clubbing or edema.  Peripheral pulses are palpable. Psych: Alert, awake and oriented, normal mood CNS:  No cranial nerve deficits.  Power equal in all extremities.  Scalp with staples. Skin: Warm and dry.  Scalp with staples.  Data Reviewed:   CBC: Recent Labs  Lab 10/08/21 0202 10/09/21 0547 10/10/21 0128 10/11/21 0714 10/13/21 0535  WBC 8.8 9.2 8.9 10.1 8.7  HGB 9.3* 8.6* 8.0* 8.5* 8.1*  HCT 27.0* 26.1* 23.2* 24.8* 24.2*  MCV 97.5 100.4* 97.9 97.6 100.0  PLT 96* 86* 102* 117* 127*     Basic Metabolic Panel: Recent Labs  Lab 10/09/21 0547 10/10/21 0128 10/11/21 0714 10/12/21 1228 10/13/21 0535  NA 140 142 142 142 139  K 4.4 3.6 4.0 4.1 4.0  CL 103 103 101 99 97*  CO2 21* '23 23 27 25  '$ GLUCOSE 119* 137* 134* 119* 151*  BUN 86* 85* 87* 90* 94*  CREATININE 15.22* 14.57* 14.34* 13.81* 13.51*  CALCIUM 8.8* 8.9 9.5 9.4 9.5  MG 2.3 2.3 2.3  --  2.4  PHOS 7.6* 7.4* 8.7* 8.6*  --      Liver Function Tests: Recent Labs  Lab 10/07/21 0249 10/10/21 0128 10/11/21 0714 10/12/21 1228  AST 16  --   --   --   ALT 10  --   --   --   ALKPHOS 50  --   --   --   BILITOT 0.5  --   --   --    PROT 5.1*  --   --   --   ALBUMIN 2.8* 2.4* 2.7* 2.6*      Radiology Studies: CT HEAD WO CONTRAST (5MM)  Result Date: 10/11/2021 CLINICAL DATA:  Subdural hematoma EXAM: CT HEAD WITHOUT CONTRAST TECHNIQUE: Contiguous axial images were obtained from the base of the skull through the vertex without intravenous contrast. RADIATION DOSE REDUCTION: This exam was performed according to the departmental dose-optimization program which includes automated exposure control, adjustment of the mA and/or kV according to patient size and/or use of iterative reconstruction technique. COMPARISON:  CT head 10/07/2021. FINDINGS: Brain: Overall decreased size of a left convexity subdural hemorrhage with small volume of hemorrhage tracking left falx. Hemorrhage measures up  to 12 mm in thickness anteriorly. Left-sided cranioplasty. Decreased midline shift, now 6 mm. No evidence of acute large vascular territory infarct, hydrocephalus, or mass lesion. Vascular: No hyperdense vessel identified. Embolization material in the region of the left middle meningeal artery. Skull: Left-sided cranioplasty with overlying scalp swelling and gas. Sinuses/Orbits: Largely clear sinuses.  Neck overall findings. Other: No mastoid effusions. IMPRESSION: Left cranioplasty with decreased size of a left subdural convexity hemorrhage measuring up to 12 mm in thickness anteriorly. Decreased midline shift, now 6 mm. Electronically Signed   By: Margaretha Sheffield M.D.   On: 10/11/2021 14:09      LOS: 8 days    Flora Lipps, MD [facility] Available via Epic secure chat 7am-7pm After these hours, please refer to coverage provider listed on [email or web address] 10/13/2021, 12:52 PM

## 2021-10-13 NOTE — Progress Notes (Signed)
PD tx initation note:   Pre TX VS: BP 155/65mHg, HR 66bpm, RR 20brpm, Sats 94% RA, T-98.80F  Pre TX weight: UTA ( Refused. Pt on the recliner sleeping)  PD treatment initiated via aseptic technique. Consent signed and in chart. Patient is alert and oriented. No complaints of pain. No specimen collected. PD exit site clean, dry and intact. Gentamycin and new dressing applied. Bedside RN educated on PD machine and how to contact tech support when PD machine alarms.

## 2021-10-13 NOTE — Progress Notes (Signed)
Brief Nutrition Note  Pt able to have diet advanced to DYS 2 with thin liquids. Will add nutrition supplements and adjust TF to nocturnal. If intake is adequate with PO, could consider pulling cortrak tube.   Nocturnal schedule: Osmolite 1.5 @ 173m/h x 10 hours (1L daily) 1 packet of prosource 20 daily (20g protein, and 80kcal) 561mfree water q8h to maintain tube patency This will provide 1700 kcal and 88g of protein (~75% of estimated needs) and 91233mf water  RacRanell PatrickD, LDN Clinical Dietitian RD pager # available in [facility] hours/weekend pager # available in [facility]

## 2021-10-13 NOTE — Progress Notes (Addendum)
Occupational Therapy Treatment Patient Details Name: Daniel Solomon MRN: [id number] DOB: 04-28-1960 Today's Date: 10/13/2021   History of present illness Pt is a 61 y.o. male who presented 10/05/21 with repeat head CT showing an increasing amount of subdural blood and L to R shift. 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 S/p craniotomy hematoma evacuation subdural 8/8. S/p craniectomy for Recurrent Intracranial Hematoma 8/10. PMH: anemia, bicuspid aortic valve, CAD, enlarged aorta, ESRD on peritoneal dialysis, glaucoma, gout, hypercholesterolemia, HTN, mild aortic stenosis, PAF, s/p TAVR, SDH, sleep apnea.   OT comments  Pt progressing towards goals, needing supervision for toileting and standing grooming tasks in room. Pt min guard for hallway ambulation with RW. Pt with impaired cognition when dual-tasking, needing significantly increased time to count backwards from 20 ( 2 errors) and to state months of the year in reverse order (4 errors). Pt's wife at bedside, now considering home vs AIR at d/c, spouse voices concerns with assisting pt at home with his dialysis, would benefit from [facility] to assist with family education on dialysis. Educated spouse on assisting pt with med mgmt tasks. Pt presenting with impairments listed below, will follow acutely. Continue to recommend AIR at d/c, however if pt declining then will need OP OT and 24/7 supervision/assist at d/c.   Recommendations for follow up therapy are one component of a multi-disciplinary discharge planning process, led by the attending physician.  Recommendations may be updated based on patient status, additional functional criteria and insurance authorization.    Follow Up Recommendations  Acute inpatient rehab (3hours/day) (if pt declining, then will need outpatient OT)    Assistance Recommended at Discharge Frequent or constant Supervision/Assistance  Patient can return home with the following  A little  help with walking and/or transfers;A little help with bathing/dressing/bathroom;Assistance with feeding;Assistance with cooking/housework;Assist for transportation;Help with stairs or ramp for entrance;Direct supervision/assist for financial management;Direct supervision/assist for medications management   Equipment Recommendations  BSC/3in1 (RW)    Recommendations for Other Services Rehab consult    Precautions / Restrictions Precautions Precautions: Fall Precaution Comments: No flap - Helmet Required Braces or Orthoses: Other Brace Other Brace: Helmet Restrictions Weight Bearing Restrictions: No       Mobility Bed Mobility               General bed mobility comments: OOB in recliner upon arrival    Transfers Overall transfer level: Needs assistance Equipment used: Rolling walker (2 wheels) Transfers: Sit to/from Stand Sit to Stand: Min guard                 Balance Overall balance assessment: Needs assistance Sitting-balance support: No upper extremity supported, Feet supported Sitting balance-Leahy Scale: Good Sitting balance - Comments: Initially upon sitting up on EOB pt with right lateral lean needing verbal and tactile cues to correct but eventually pt self corrected and maintained.   Standing balance support: During functional activity, No upper extremity supported Standing balance-Leahy Scale: Fair Standing balance comment: able to ambulate in room without RW, RW needed for hallway ambualtion                           ADL either performed or assessed with clinical judgement   ADL Overall ADL's : Needs assistance/impaired     Grooming: Supervision/safety;Standing                   Toilet Transfer: Supervision/safety;Ambulation;Regular Toilet;Rolling walker (2 wheels)  Toileting- Clothing Manipulation and Hygiene: Supervision/safety       Functional mobility during ADLs: Supervision/safety;Rolling walker (2 wheels)       Extremity/Trunk Assessment Upper Extremity Assessment Upper Extremity Assessment: RUE deficits/detail RUE Sensation: decreased proprioception RUE Coordination: decreased fine motor;decreased gross motor LUE Coordination: WNL   Lower Extremity Assessment Lower Extremity Assessment: Defer to PT evaluation        Vision   Additional Comments: appearing WFL for tasks assessed   Perception Perception Perception: Not tested   Praxis Praxis Praxis: Not tested    Cognition Arousal/Alertness: Awake/alert Behavior During Therapy: Flat affect Overall Cognitive Status: Impaired/Different from baseline Area of Impairment: Memory, Following commands, Safety/judgement, Awareness, Problem solving                   Current Attention Level: Sustained Memory: Decreased short-term memory Following Commands: Follows one step commands inconsistently, Follows one step commands with increased time Safety/Judgement: Decreased awareness of safety, Decreased awareness of deficits Awareness: Emergent Problem Solving: Slow processing, Decreased initiation, Difficulty sequencing, Requires verbal cues, Requires tactile cues General Comments: increased time needed to count backwards and state months of yr in reverse order        Exercises      Shoulder Instructions       General Comments VSS On RA    Pertinent Vitals/ Pain       Pain Assessment Pain Assessment: No/denies pain Pain Score: 3  Pain Location: Head Pain Descriptors / Indicators: Headache Pain Intervention(s): Limited activity within patient's tolerance, Monitored during session, Repositioned  Home Living Family/patient expects to be discharged to:: Private residence                                        Prior Functioning/Environment              Frequency  Min 2X/week        Progress Toward Goals  OT Goals(current goals can now be found in the care plan section)  Progress towards OT  goals: Progressing toward goals  Acute Rehab OT Goals Patient Stated Goal: none stated OT Goal Formulation: With patient/family Time For Goal Achievement: 10/22/21 Potential to Achieve Goals: Good ADL Goals Pt Will Perform Grooming: with set-up;with supervision;standing Pt Will Perform Upper Body Bathing: with set-up;with supervision;sitting Pt Will Perform Upper Body Dressing: with set-up;with supervision;sitting Pt Will Transfer to Toilet: with supervision;ambulating;grab bars Pt Will Perform Toileting - Clothing Manipulation and hygiene: with min assist Pt/caregiver will Perform Home Exercise Program: Increased ROM;Right Upper extremity;With Supervision;With written HEP provided Additional ADL Goal #1: Pt will be S in and OOB for basic ADLs with bed in normal position Additional ADL Goal #2: Continue to assess vision as eye swelling diminshes  Plan Discharge plan remains appropriate;Frequency remains appropriate    Co-evaluation                 AM-PAC OT "6 Clicks" Daily Activity     Outcome Measure   Help from another person eating meals?: A Little Help from another person taking care of personal grooming?: A Little Help from another person toileting, which includes using toliet, bedpan, or urinal?: A Little Help from another person bathing (including washing, rinsing, drying)?: A Little Help from another person to put on and taking off regular upper body clothing?: A Little Help from another person to put on and taking off regular lower  body clothing?: A Little 6 Click Score: 18    End of Session Equipment Utilized During Treatment: Gait belt;Rolling walker (2 wheels)  OT Visit Diagnosis: Unsteadiness on feet (R26.81);Other abnormalities of gait and mobility (R26.89);Muscle weakness (generalized) (M62.81);Other symptoms and signs involving cognitive function;Low vision, both eyes (H54.2);Hemiplegia and hemiparesis Hemiplegia - Right/Left: Right Hemiplegia -  dominant/non-dominant: Dominant   Activity Tolerance Patient tolerated treatment well   Patient Left in chair;with family/visitor present   Nurse Communication Mobility status        Time: 2094-7096 OT Time Calculation (min): 16 min  Charges: OT General Charges $OT Visit: 1 Visit OT Treatments $Self Care/Home Management : 8-22 mins  Lynnda Child, OTD, OTR/L Acute Rehab [phone] - [place] 10/13/2021, 3:56 PM

## 2021-10-13 NOTE — Progress Notes (Signed)
[facility] Progress Note  Subjective: patient seen and examined in room, currently running on PD. Wife at bedside as well. Started PD late last night. Is noticing that feet are starting to swell somewhat. Otherwise no other complaints.  Vitals:   10/12/21 2300 10/13/21 0016 10/13/21 0419 10/13/21 0924  BP: (!) 155/62 (!) 112/95 131/71 (!) 141/82  Pulse: 66 85 70 77  Resp: '20 19 19 18  '$ Temp: 98.2 F (36.8 C) 98.5 F (36.9 C) 98 F (36.7 C) 97.8 F (36.6 C)  TempSrc: Oral Oral Oral Oral  SpO2: 100% 94% 97% 100%  Weight:      Height:        Exam: Gen: NAD, sitting up in chair HEENT: +NGT CV: RRR Resp: CTA bl Abd: soft, NT Ext: trace pedal edema Neuro: awake, alert Dialysis access: RLQ PD  Recent Labs  Lab 10/11/21 0714 10/12/21 1228 10/13/21 0535  HGB 8.5*  --  8.1*  ALBUMIN 2.7* 2.6*  --   CALCIUM 9.5 9.4 9.5  PHOS 8.7* 8.6*  --   CREATININE 14.34* 13.81* 13.51*  K 4.0 4.1 4.0   Recent Labs  Lab 10/06/21 1042 10/10/21 1608  IRON 79 70  TIBC 150* 154*  FERRITIN  --  1,449*   Inpatient medications:  allopurinol  100 mg Per Tube Daily   atorvastatin  40 mg Per Tube Daily   calcitRIOL  0.25 mcg Per Tube Daily   Chlorhexidine Gluconate Cloth  6 each Topical Daily   darbepoetin (ARANESP) injection - NON-DIALYSIS  60 mcg Subcutaneous Q Sun-1800   feeding supplement  237 mL Oral TID BM   feeding supplement (OSMOLITE 1.5 CAL)  1,000 mL Per Tube Q24H   feeding supplement (PROSource TF20)  60 mL Per Tube Daily   ferric citrate  630 mg Oral TID WC   free water  50 mL Per Tube Q8H   gentamicin cream  1 Application Topical Daily   labetalol  100 mg Per Tube BID   levETIRAcetam  500 mg Per Tube BID   pantoprazole  40 mg Per Tube Daily   sennosides  5 mL Per Tube BID    sodium chloride 10 mL/hr at 10/05/21 1718   dialysis solution 1.5% low-MG/low-CA     acetaminophen **OR** acetaminophen, bisacodyl, heparin 2,500 Units in dialysis solution 1.5%  low-MG/low-CA 5,000 mL dialysis solution, HYDROcodone-acetaminophen, labetalol, naLOXone (NARCAN)  injection, ondansetron **OR** ondansetron (ZOFRAN) IV   OP PD: CCPD [facility] 7d /wk 93kg  5 overnight, 3000 fill, last fill 2500 for daybag - last Hb 11.6 on 7/7, tsat 14%, ferr 754 - no esa or IV fe currently    Assessment/ Plan: SDH w/ brain compression - sp SDH evac and craniotomy 8/8. Went back to OR 8/11 for hematoma evacuation.  Doing much better w/ mentation. Per NSGY & primary.   PAF - holding eliquis for now, per cards/ NSGY ESRD - on CCPD. Continue nightly CCPD-1.5% bags CAD hx PCI / sp TAVR HTN - on home labetalol 100 bid. torsemide '100mg'$  qd was on hold in the context of being under edw but restarting as below Volume - Use all 1.5% w/ PD. Torsemide had been discontinued. Does have some pedal edema. Will restart torsemide but at '80mg'$  daily. 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 Started on esa w/ darbe 60 mcg weekly 8/13. fe studies: iron replete. Transfuse prn.  MBD ckd - CCa in range,  phos is high. Cont daily vdra, and cont auryxia as binder. PO4 8.6 on 8/15, increasing [place]

## 2021-10-13 NOTE — Progress Notes (Signed)
Patient refuses CPAP. No unit in room at this time.

## 2021-10-14 MED ORDER — LEVETIRACETAM 250 MG PO TABS: 250.0000 mg | ORAL_TABLET | Freq: Two times a day (BID) | ORAL | 0 refills | Status: DC

## 2021-10-14 MED ORDER — HYDROCODONE-ACETAMINOPHEN 5-325 MG PO TABS: 1.0000 | ORAL_TABLET | Freq: Four times a day (QID) | ORAL | 0 refills | Status: DC | PRN

## 2021-10-14 MED ORDER — PANTOPRAZOLE 2 MG/ML SUSPENSION
40.0000 mg | Freq: Every day | ORAL | Status: DC
  Administered 2021-10-14: 40 mg via ORAL
  Filled 2021-10-14: qty 20

## 2021-10-14 NOTE — Discharge Summary (Signed)
Physician Discharge Summary  Patient ID: Daniel Solomon MRN: [id number] DOB/AGE: 10-28-1960 61 y.o.  Admit date: 10/05/2021 Discharge date: 10/14/2021  Admission Diagnoses: L SDH   Discharge Diagnoses: same   Discharged Condition: stable  Hospital Course: The patient was admitted on 10/05/2021 and taken to the operating room where the patient underwent carniotomy for L SDH by Dr Christella Noa. . The patient tolerated the procedure well and was taken to the recovery room and then to the ICU in stable condition. He developed post-op aphasia, CT showed large recurrent clot and he returned to the OR for repeat evacution. The wound remained clean dry and intact. His aphasia improved over the next week, PT/OT/ST helped. He remained on dialysis. The patient remained afebrile with stable vital signs, and tolerated a regular diet. By discharge. CIR saw the pt. The patient continued to increase activities, and pain was well controlled with oral pain medications.   Consults: pulmonary/intensive care, nephrology, and rehabilitation medicine  Significant Diagnostic Studies:  Results for orders placed or performed during the hospital encounter of 10/05/21  Surgical PCR screen   Specimen: Nasal Mucosa; Nasal Swab  Result Value Ref Range   MRSA, PCR NEGATIVE NEGATIVE   Staphylococcus aureus NEGATIVE NEGATIVE  Basic metabolic panel  Result Value Ref Range   Sodium 139 135 - 145 mmol/L   Potassium 4.3 3.5 - 5.1 mmol/L   Chloride 97 (L) 98 - 111 mmol/L   CO2 24 22 - 32 mmol/L   Glucose, Bld 105 (H) 70 - 99 mg/dL   BUN 95 (H) 6 - 20 mg/dL   Creatinine, Ser 18.17 (H) 0.61 - 1.24 mg/dL   Calcium 9.7 8.9 - 10.3 mg/dL   GFR, Estimated 3 (L) >60 mL/min   Anion gap 18 (H) 5 - 15  CBC with Differential  Result Value Ref Range   WBC 7.1 4.0 - 10.5 K/uL   RBC 3.19 (L) 4.22 - 5.81 MIL/uL   Hemoglobin 11.0 (L) 13.0 - 17.0 g/dL   HCT 32.9 (L) 39.0 - 52.0 %   MCV 103.1 (H) 80.0 - 100.0 fL   MCH 34.5 (H) 26.0 -  34.0 pg   MCHC 33.4 30.0 - 36.0 g/dL   RDW 13.5 11.5 - 15.5 %   Platelets 121 (L) 150 - 400 K/uL   nRBC 0.0 0.0 - 0.2 %   Neutrophils Relative % 73 %   Neutro Abs 5.2 1.7 - 7.7 K/uL   Lymphocytes Relative 16 %   Lymphs Abs 1.1 0.7 - 4.0 K/uL   Monocytes Relative 8 %   Monocytes Absolute 0.6 0.1 - 1.0 K/uL   Eosinophils Relative 2 %   Eosinophils Absolute 0.1 0.0 - 0.5 K/uL   Basophils Relative 0 %   Basophils Absolute 0.0 0.0 - 0.1 K/uL   Immature Granulocytes 1 %   Abs Immature Granulocytes 0.04 0.00 - 0.07 K/uL  CBC WITH DIFFERENTIAL  Result Value Ref Range   WBC 6.1 4.0 - 10.5 K/uL   RBC 2.57 (L) 4.22 - 5.81 MIL/uL   Hemoglobin 8.7 (L) 13.0 - 17.0 g/dL   HCT 26.4 (L) 39.0 - 52.0 %   MCV 102.7 (H) 80.0 - 100.0 fL   MCH 33.9 26.0 - 34.0 pg   MCHC 33.0 30.0 - 36.0 g/dL   RDW 13.4 11.5 - 15.5 %   Platelets 102 (L) 150 - 400 K/uL   nRBC 0.0 0.0 - 0.2 %   Neutrophils Relative % 89 %   Neutro Abs  5.4 1.7 - 7.7 K/uL   Lymphocytes Relative 8 %   Lymphs Abs 0.5 (L) 0.7 - 4.0 K/uL   Monocytes Relative 2 %   Monocytes Absolute 0.1 0.1 - 1.0 K/uL   Eosinophils Relative 0 %   Eosinophils Absolute 0.0 0.0 - 0.5 K/uL   Basophils Relative 0 %   Basophils Absolute 0.0 0.0 - 0.1 K/uL   Immature Granulocytes 1 %   Abs Immature Granulocytes 0.03 0.00 - 0.07 K/uL  Protime-INR  Result Value Ref Range   Prothrombin Time 15.7 (H) 11.4 - 15.2 seconds   INR 1.3 (H) 0.8 - 1.2  APTT  Result Value Ref Range   aPTT 31 24 - 36 seconds  Triglycerides  Result Value Ref Range   Triglycerides 213 (H) <150 mg/dL  Albumin  Result Value Ref Range   Albumin 3.2 (L) 3.5 - 5.0 g/dL  Phosphorus  Result Value Ref Range   Phosphorus 9.2 (H) 2.5 - 4.6 mg/dL  Iron and TIBC  Result Value Ref Range   Iron 79 45 - 182 ug/dL   TIBC 150 (L) 250 - 450 ug/dL   Saturation Ratios 53 (H) 17.9 - 39.5 %   UIBC 71 ug/dL  CBC  Result Value Ref Range   WBC 9.5 4.0 - 10.5 K/uL   RBC 2.36 (L) 4.22 - 5.81 MIL/uL    Hemoglobin 8.1 (L) 13.0 - 17.0 g/dL   HCT 24.2 (L) 39.0 - 52.0 %   MCV 102.5 (H) 80.0 - 100.0 fL   MCH 34.3 (H) 26.0 - 34.0 pg   MCHC 33.5 30.0 - 36.0 g/dL   RDW 13.4 11.5 - 15.5 %   Platelets 103 (L) 150 - 400 K/uL   nRBC 0.0 0.0 - 0.2 %  Comprehensive metabolic panel  Result Value Ref Range   Sodium 137 135 - 145 mmol/L   Potassium 4.6 3.5 - 5.1 mmol/L   Chloride 101 98 - 111 mmol/L   CO2 18 (L) 22 - 32 mmol/L   Glucose, Bld 109 (H) 70 - 99 mg/dL   BUN 102 (H) 6 - 20 mg/dL   Creatinine, Ser 18.03 (H) 0.61 - 1.24 mg/dL   Calcium 9.3 8.9 - 10.3 mg/dL   Total Protein 5.1 (L) 6.5 - 8.1 g/dL   Albumin 2.8 (L) 3.5 - 5.0 g/dL   AST 16 15 - 41 U/L   ALT 10 0 - 44 U/L   Alkaline Phosphatase 50 38 - 126 U/L   Total Bilirubin 0.5 0.3 - 1.2 mg/dL   GFR, Estimated 3 (L) >60 mL/min   Anion gap 18 (H) 5 - 15  Basic metabolic panel  Result Value Ref Range   Sodium 137 135 - 145 mmol/L   Potassium 4.7 3.5 - 5.1 mmol/L   Chloride 100 98 - 111 mmol/L   CO2 21 (L) 22 - 32 mmol/L   Glucose, Bld 106 (H) 70 - 99 mg/dL   BUN 97 (H) 6 - 20 mg/dL   Creatinine, Ser 17.36 (H) 0.61 - 1.24 mg/dL   Calcium 9.5 8.9 - 10.3 mg/dL   GFR, Estimated 3 (L) >60 mL/min   Anion gap 16 (H) 5 - 15  Basic metabolic panel  Result Value Ref Range   Sodium 137 135 - 145 mmol/L   Potassium 4.5 3.5 - 5.1 mmol/L   Chloride 102 98 - 111 mmol/L   CO2 18 (L) 22 - 32 mmol/L   Glucose, Bld 116 (H) 70 - 99 mg/dL  BUN 96 (H) 6 - 20 mg/dL   Creatinine, Ser 16.34 (H) 0.61 - 1.24 mg/dL   Calcium 8.6 (L) 8.9 - 10.3 mg/dL   GFR, Estimated 3 (L) >60 mL/min   Anion gap 17 (H) 5 - 15  CBC  Result Value Ref Range   WBC 8.8 4.0 - 10.5 K/uL   RBC 2.77 (L) 4.22 - 5.81 MIL/uL   Hemoglobin 9.3 (L) 13.0 - 17.0 g/dL   HCT 27.0 (L) 39.0 - 52.0 %   MCV 97.5 80.0 - 100.0 fL   MCH 33.6 26.0 - 34.0 pg   MCHC 34.4 30.0 - 36.0 g/dL   RDW 16.6 (H) 11.5 - 15.5 %   Platelets 96 (L) 150 - 400 K/uL   nRBC 0.0 0.0 - 0.2 %  APTT   Result Value Ref Range   aPTT 30 24 - 36 seconds  Protime-INR  Result Value Ref Range   Prothrombin Time 17.7 (H) 11.4 - 15.2 seconds   INR 1.5 (H) 0.8 - 1.2  Basic metabolic panel  Result Value Ref Range   Sodium 140 135 - 145 mmol/L   Potassium 4.4 3.5 - 5.1 mmol/L   Chloride 103 98 - 111 mmol/L   CO2 21 (L) 22 - 32 mmol/L   Glucose, Bld 119 (H) 70 - 99 mg/dL   BUN 86 (H) 6 - 20 mg/dL   Creatinine, Ser 15.22 (H) 0.61 - 1.24 mg/dL   Calcium 8.8 (L) 8.9 - 10.3 mg/dL   GFR, Estimated 3 (L) >60 mL/min   Anion gap 16 (H) 5 - 15  CBC  Result Value Ref Range   WBC 9.2 4.0 - 10.5 K/uL   RBC 2.60 (L) 4.22 - 5.81 MIL/uL   Hemoglobin 8.6 (L) 13.0 - 17.0 g/dL   HCT 26.1 (L) 39.0 - 52.0 %   MCV 100.4 (H) 80.0 - 100.0 fL   MCH 33.1 26.0 - 34.0 pg   MCHC 33.0 30.0 - 36.0 g/dL   RDW 16.9 (H) 11.5 - 15.5 %   Platelets 86 (L) 150 - 400 K/uL   nRBC 0.0 0.0 - 0.2 %  Magnesium  Result Value Ref Range   Magnesium 2.3 1.7 - 2.4 mg/dL  Phosphorus  Result Value Ref Range   Phosphorus 7.6 (H) 2.5 - 4.6 mg/dL  Glucose, capillary  Result Value Ref Range   Glucose-Capillary 133 (H) 70 - 99 mg/dL   Comment 1 Notify RN    Comment 2 Document in Chart   Glucose, capillary  Result Value Ref Range   Glucose-Capillary 138 (H) 70 - 99 mg/dL   Comment 1 Notify RN    Comment 2 Document in Chart   Glucose, capillary  Result Value Ref Range   Glucose-Capillary 136 (H) 70 - 99 mg/dL   Comment 1 Notify RN    Comment 2 Document in Chart   Glucose, capillary  Result Value Ref Range   Glucose-Capillary 154 (H) 70 - 99 mg/dL  Glucose, capillary  Result Value Ref Range   Glucose-Capillary 135 (H) 70 - 99 mg/dL  Glucose, capillary  Result Value Ref Range   Glucose-Capillary 150 (H) 70 - 99 mg/dL  Magnesium  Result Value Ref Range   Magnesium 2.3 1.7 - 2.4 mg/dL  Glucose, capillary  Result Value Ref Range   Glucose-Capillary 142 (H) 70 - 99 mg/dL  Glucose, capillary  Result Value Ref Range    Glucose-Capillary 122 (H) 70 - 99 mg/dL  CBC  Result Value Ref  Range   WBC 8.9 4.0 - 10.5 K/uL   RBC 2.37 (L) 4.22 - 5.81 MIL/uL   Hemoglobin 8.0 (L) 13.0 - 17.0 g/dL   HCT 23.2 (L) 39.0 - 52.0 %   MCV 97.9 80.0 - 100.0 fL   MCH 33.8 26.0 - 34.0 pg   MCHC 34.5 30.0 - 36.0 g/dL   RDW 16.5 (H) 11.5 - 15.5 %   Platelets 102 (L) 150 - 400 K/uL   nRBC 0.0 0.0 - 0.2 %  Renal function panel  Result Value Ref Range   Sodium 142 135 - 145 mmol/L   Potassium 3.6 3.5 - 5.1 mmol/L   Chloride 103 98 - 111 mmol/L   CO2 23 22 - 32 mmol/L   Glucose, Bld 137 (H) 70 - 99 mg/dL   BUN 85 (H) 6 - 20 mg/dL   Creatinine, Ser 14.57 (H) 0.61 - 1.24 mg/dL   Calcium 8.9 8.9 - 10.3 mg/dL   Phosphorus 7.4 (H) 2.5 - 4.6 mg/dL   Albumin 2.4 (L) 3.5 - 5.0 g/dL   GFR, Estimated 3 (L) >60 mL/min   Anion gap 16 (H) 5 - 15  Glucose, capillary  Result Value Ref Range   Glucose-Capillary 136 (H) 70 - 99 mg/dL  Glucose, capillary  Result Value Ref Range   Glucose-Capillary 114 (H) 70 - 99 mg/dL  Glucose, capillary  Result Value Ref Range   Glucose-Capillary 147 (H) 70 - 99 mg/dL  Iron and TIBC  Result Value Ref Range   Iron 70 45 - 182 ug/dL   TIBC 154 (L) 250 - 450 ug/dL   Saturation Ratios 46 (H) 17.9 - 39.5 %   UIBC 84 ug/dL  Ferritin  Result Value Ref Range   Ferritin 1,449 (H) 24 - 336 ng/mL  Glucose, capillary  Result Value Ref Range   Glucose-Capillary 143 (H) 70 - 99 mg/dL  Magnesium  Result Value Ref Range   Magnesium 2.3 1.7 - 2.4 mg/dL  CBC  Result Value Ref Range   WBC 10.1 4.0 - 10.5 K/uL   RBC 2.54 (L) 4.22 - 5.81 MIL/uL   Hemoglobin 8.5 (L) 13.0 - 17.0 g/dL   HCT 24.8 (L) 39.0 - 52.0 %   MCV 97.6 80.0 - 100.0 fL   MCH 33.5 26.0 - 34.0 pg   MCHC 34.3 30.0 - 36.0 g/dL   RDW 15.6 (H) 11.5 - 15.5 %   Platelets 117 (L) 150 - 400 K/uL   nRBC 0.0 0.0 - 0.2 %  Glucose, capillary  Result Value Ref Range   Glucose-Capillary 138 (H) 70 - 99 mg/dL  Glucose, capillary  Result Value Ref  Range   Glucose-Capillary 131 (H) 70 - 99 mg/dL  Glucose, capillary  Result Value Ref Range   Glucose-Capillary 133 (H) 70 - 99 mg/dL  Renal function panel  Result Value Ref Range   Sodium 142 135 - 145 mmol/L   Potassium 4.0 3.5 - 5.1 mmol/L   Chloride 101 98 - 111 mmol/L   CO2 23 22 - 32 mmol/L   Glucose, Bld 134 (H) 70 - 99 mg/dL   BUN 87 (H) 6 - 20 mg/dL   Creatinine, Ser 14.34 (H) 0.61 - 1.24 mg/dL   Calcium 9.5 8.9 - 10.3 mg/dL   Phosphorus 8.7 (H) 2.5 - 4.6 mg/dL   Albumin 2.7 (L) 3.5 - 5.0 g/dL   GFR, Estimated 4 (L) >60 mL/min   Anion gap 18 (H) 5 - 15  Glucose, capillary  Result Value Ref Range   Glucose-Capillary 108 (H) 70 - 99 mg/dL  Glucose, capillary  Result Value Ref Range   Glucose-Capillary 140 (H) 70 - 99 mg/dL  Glucose, capillary  Result Value Ref Range   Glucose-Capillary 120 (H) 70 - 99 mg/dL  Glucose, capillary  Result Value Ref Range   Glucose-Capillary 91 70 - 99 mg/dL  Glucose, capillary  Result Value Ref Range   Glucose-Capillary 138 (H) 70 - 99 mg/dL  Renal function panel  Result Value Ref Range   Sodium 142 135 - 145 mmol/L   Potassium 4.1 3.5 - 5.1 mmol/L   Chloride 99 98 - 111 mmol/L   CO2 27 22 - 32 mmol/L   Glucose, Bld 119 (H) 70 - 99 mg/dL   BUN 90 (H) 6 - 20 mg/dL   Creatinine, Ser 13.81 (H) 0.61 - 1.24 mg/dL   Calcium 9.4 8.9 - 10.3 mg/dL   Phosphorus 8.6 (H) 2.5 - 4.6 mg/dL   Albumin 2.6 (L) 3.5 - 5.0 g/dL   GFR, Estimated 4 (L) >60 mL/min   Anion gap 16 (H) 5 - 15  Glucose, capillary  Result Value Ref Range   Glucose-Capillary 131 (H) 70 - 99 mg/dL  Glucose, capillary  Result Value Ref Range   Glucose-Capillary 116 (H) 70 - 99 mg/dL  Glucose, capillary  Result Value Ref Range   Glucose-Capillary 93 70 - 99 mg/dL  Basic metabolic panel  Result Value Ref Range   Sodium 139 135 - 145 mmol/L   Potassium 4.0 3.5 - 5.1 mmol/L   Chloride 97 (L) 98 - 111 mmol/L   CO2 25 22 - 32 mmol/L   Glucose, Bld 151 (H) 70 - 99 mg/dL    BUN 94 (H) 6 - 20 mg/dL   Creatinine, Ser 13.51 (H) 0.61 - 1.24 mg/dL   Calcium 9.5 8.9 - 10.3 mg/dL   GFR, Estimated 4 (L) >60 mL/min   Anion gap 17 (H) 5 - 15  CBC  Result Value Ref Range   WBC 8.7 4.0 - 10.5 K/uL   RBC 2.42 (L) 4.22 - 5.81 MIL/uL   Hemoglobin 8.1 (L) 13.0 - 17.0 g/dL   HCT 24.2 (L) 39.0 - 52.0 %   MCV 100.0 80.0 - 100.0 fL   MCH 33.5 26.0 - 34.0 pg   MCHC 33.5 30.0 - 36.0 g/dL   RDW 15.2 11.5 - 15.5 %   Platelets 127 (L) 150 - 400 K/uL   nRBC 0.0 0.0 - 0.2 %  Magnesium  Result Value Ref Range   Magnesium 2.4 1.7 - 2.4 mg/dL  Glucose, capillary  Result Value Ref Range   Glucose-Capillary 110 (H) 70 - 99 mg/dL  Glucose, capillary  Result Value Ref Range   Glucose-Capillary 104 (H) 70 - 99 mg/dL  Glucose, capillary  Result Value Ref Range   Glucose-Capillary 122 (H) 70 - 99 mg/dL   Comment 1 Notify RN    Comment 2 Document in Chart   Glucose, capillary  Result Value Ref Range   Glucose-Capillary 147 (H) 70 - 99 mg/dL   Comment 1 Notify RN    Comment 2 Document in Chart   Glucose, capillary  Result Value Ref Range   Glucose-Capillary 104 (H) 70 - 99 mg/dL   Comment 1 Notify RN    Comment 2 Document in Chart   Glucose, capillary  Result Value Ref Range   Glucose-Capillary 96 70 - 99 mg/dL  I-STAT 7, (LYTES, BLD GAS, ICA, H+H)  Result Value Ref Range   pH, Arterial 7.446 7.35 - 7.45   pCO2 arterial 30.2 (L) 32 - 48 mmHg   pO2, Arterial 188 (H) 83 - 108 mmHg   Bicarbonate 21.0 20.0 - 28.0 mmol/L   TCO2 22 22 - 32 mmol/L   O2 Saturation 100 %   Acid-base deficit 3.0 (H) 0.0 - 2.0 mmol/L   Sodium 134 (L) 135 - 145 mmol/L   Potassium 5.0 3.5 - 5.1 mmol/L   Calcium, Ion 1.15 1.15 - 1.40 mmol/L   HCT 28.0 (L) 39.0 - 52.0 %   Hemoglobin 9.5 (L) 13.0 - 17.0 g/dL   Patient temperature 97.3 F    Sample type ARTERIAL   I-STAT 7, (LYTES, BLD GAS, ICA, H+H)  Result Value Ref Range   pH, Arterial 7.323 (L) 7.35 - 7.45   pCO2 arterial 39.0 32 - 48 mmHg    pO2, Arterial 91 83 - 108 mmHg   Bicarbonate 20.6 20.0 - 28.0 mmol/L   TCO2 22 22 - 32 mmol/L   O2 Saturation 97 %   Acid-base deficit 5.0 (H) 0.0 - 2.0 mmol/L   Sodium 136 135 - 145 mmol/L   Potassium 5.1 3.5 - 5.1 mmol/L   Calcium, Ion 1.16 1.15 - 1.40 mmol/L   HCT 21.0 (L) 39.0 - 52.0 %   Hemoglobin 7.1 (L) 13.0 - 17.0 g/dL   Patient temperature 35.7 C    Sample type ARTERIAL   I-STAT 7, (LYTES, BLD GAS, ICA, H+H)  Result Value Ref Range   pH, Arterial 7.277 (L) 7.35 - 7.45   pCO2 arterial 40.5 32 - 48 mmHg   pO2, Arterial 110 (H) 83 - 108 mmHg   Bicarbonate 18.9 (L) 20.0 - 28.0 mmol/L   TCO2 20 (L) 22 - 32 mmol/L   O2 Saturation 98 %   Acid-base deficit 7.0 (H) 0.0 - 2.0 mmol/L   Sodium 134 (L) 135 - 145 mmol/L   Potassium 5.4 (H) 3.5 - 5.1 mmol/L   Calcium, Ion 1.10 (L) 1.15 - 1.40 mmol/L   HCT 24.0 (L) 39.0 - 52.0 %   Hemoglobin 8.2 (L) 13.0 - 17.0 g/dL   Sample type ARTERIAL   Type and screen [place]  Result Value Ref Range   ABO/RH(D) O POS    Antibody Screen NEG    Sample Expiration 10/08/2021,2359    Unit Number [id number]    Blood Component Type RED CELLS,LR    Unit division 00    Status of Unit ISSUED,FINAL    Transfusion Status OK TO TRANSFUSE    Crossmatch Result      Compatible Performed at [facility], [address], [place], [place] [address]    Unit Number [id number]    Blood Component Type RED CELLS,LR    Unit division 00    Status of Unit ISSUED,FINAL    Transfusion Status OK TO TRANSFUSE    Crossmatch Result Compatible   Prepare RBC (crossmatch)  Result Value Ref Range   Order Confirmation      ORDER PROCESSED BY BLOOD BANK Performed at [facility], [place] [address], [place], [place] [address]   BP[facility]  Result Value Ref Range   ISSUE DATE / TIME 371696789381    Blood Product Unit Number [id number]    PRODUCT CODE [id number]    Unit Type and Rh 5100    Blood Product Expiration Date 144315400867     ISSUE DATE / TIME [id number]  Blood Product Unit Number [id number]    PRODUCT CODE [id number]    Unit Type and Rh 5100    Blood Product Expiration Date [id number]     CT HEAD WO CONTRAST (5MM)  Result Date: 10/11/2021 CLINICAL DATA:  Subdural hematoma EXAM: CT HEAD WITHOUT CONTRAST TECHNIQUE: Contiguous axial images were obtained from the base of the skull through the vertex without intravenous contrast. RADIATION DOSE REDUCTION: This exam was performed according to the departmental dose-optimization program which includes automated exposure control, adjustment of the mA and/or kV according to patient size and/or use of iterative reconstruction technique. COMPARISON:  CT head 10/07/2021. FINDINGS: Brain: Overall decreased size of a left convexity subdural hemorrhage with small volume of hemorrhage tracking left falx. Hemorrhage measures up to 12 mm in thickness anteriorly. Left-sided cranioplasty. Decreased midline shift, now 6 mm. No evidence of acute large vascular territory infarct, hydrocephalus, or mass lesion. Vascular: No hyperdense vessel identified. Embolization material in the region of the left middle meningeal artery. Skull: Left-sided cranioplasty with overlying scalp swelling and gas. Sinuses/Orbits: Largely clear sinuses.  Neck overall findings. Other: No mastoid effusions. IMPRESSION: Left cranioplasty with decreased size of a left subdural convexity hemorrhage measuring up to 12 mm in thickness anteriorly. Decreased midline shift, now 6 mm. Electronically Signed   By: Margaretha Sheffield M.D.   On: 10/11/2021 14:09   DG Abd 1 View  Result Date: 10/08/2021 CLINICAL DATA:  Nasogastric tube placement. EXAM: ABDOMEN - 1 VIEW COMPARISON:  03/26/2012.  CT, 03/01/2012. FINDINGS: Enteric feeding tube has its tip projecting in the distal stomach. Mildly dilated loops of small bowel are noted in the central and left upper abdomen. IMPRESSION: Enteric feeding tube tip projects in the  distal stomach. Electronically Signed   By: Lajean Manes M.D.   On: 10/08/2021 12:13   CT HEAD WO CONTRAST (5MM)  Result Date: 10/07/2021 CLINICAL DATA:  Mental status change in the setting of recent craniotomy, rule out rebleed EXAM: CT HEAD WITHOUT CONTRAST TECHNIQUE: Contiguous axial images were obtained from the base of the skull through the vertex without intravenous contrast. RADIATION DOSE REDUCTION: This exam was performed according to the departmental dose-optimization program which includes automated exposure control, adjustment of the mA and/or kV according to patient size and/or use of iterative reconstruction technique. COMPARISON:  Yesterday FINDINGS: Brain: Mixed density subdural hematoma on the left which includes gas after evacuation. Degree of fluid is increased and gas is decreased. No increasing high-density clot. Frontal low-density subdural component measures up to 16 mm. Midline shift measures 15 mm, increased from before. No entrapment or complicating infarct. As expected for this degree, there is subfalcine and left uncal herniation which was also seen some degree on prior. Vascular: Negative Skull: Unremarkable craniotomy Sinuses/Orbits: Fairly pronounced swelling to the scalp and face. Other: Notable degree of shift directly communicated with provider in epic. IMPRESSION: Recently evacuated mixed density subdural hematoma on the left. No interval acute hemorrhage but there is progressive mass effect with 15 mm of midline shift. Along the left frontal convexity the subdural collection measures up to 16 mm in thickness. Electronically Signed   By: Jorje Guild M.D.   On: 10/07/2021 15:12   CT HEAD WO CONTRAST (5MM)  Result Date: 10/06/2021 CLINICAL DATA:  Head trauma.  Craniotomy for subdural hematoma EXAM: CT HEAD WITHOUT CONTRAST TECHNIQUE: Contiguous axial images were obtained from the base of the skull through the vertex without intravenous contrast. RADIATION DOSE REDUCTION:  This exam was performed according to  the departmental dose-optimization program which includes automated exposure control, adjustment of the mA and/or kV according to patient size and/or use of iterative reconstruction technique. COMPARISON:  CT head 10/05/2021 FINDINGS: Brain: Interval left frontal parietal craniotomy. Mixed density subdural fluid collection on the left. There is progression of high-density hemorrhage in the subdural space. There is mild gas in the subdural space due to craniotomy. Left subdural hematoma measures up to 11 mm in thickness. 11 mm midline shift to the right slightly improved. Negative for hydrocephalus.  Negative for acute infarct or mass. Vascular: Negative for hyperdense vessel. Embolization coils in the left middle meningeal artery, unchanged from the prior CT. Skull: Left frontal parietal craniotomy. Craniotomy flap in good position. Sinuses/Orbits: Mild mucosal edema paranasal sinuses without air-fluid level. Negative orbit Other: None IMPRESSION: 1. Interval left frontal parietal craniotomy. Left subdural hematoma measures 11 mm in diameter. There is increase in high-density hemorrhage in the collection. There is mild gas in the collection. 11 mm midline shift to the right slightly improved from preoperative CT yesterday Electronically Signed   By: Franchot Gallo M.D.   On: 10/06/2021 14:14   DG CHEST PORT 1 VIEW  Result Date: 10/05/2021 CLINICAL DATA:  Respiratory failure EXAM: PORTABLE CHEST 1 VIEW COMPARISON:  01/15/2021 FINDINGS: Endotracheal tube is seen 6.6 cm above the carina. Nasogastric tube tip is within the expected gastric fundus. Lungs are clear. No pneumothorax or pleural effusion. 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 Cardiac size is within normal limits. Pulmonary vascularity is normal. No acute bone abnormality. IMPRESSION: 1. Support tubes in appropriate position. 2. No active disease. Electronically Signed   By: Fidela Salisbury M.D.    On: 10/05/2021 23:52   CT Head Wo Contrast  Result Date: 10/05/2021 CLINICAL DATA:  Provided history: Mental status change unknown cause. Additional history provided: Worsening mental fog ongoing for 1 month. Intermittent aphasia, confusion occasional dragging of foot all walking. Recent procedure for aneurysm. EXAM: CT HEAD WITHOUT CONTRAST TECHNIQUE: Contiguous axial images were obtained from the base of the skull through the vertex without intravenous contrast. RADIATION DOSE REDUCTION: This exam was performed according to the departmental dose-optimization program which includes automated exposure control, adjustment of the mA and/or kV according to patient size and/or use of iterative reconstruction technique. COMPARISON:  Head CT 09/23/2021. Brain MRI 09/22/2021. FINDINGS: Brain: Interval increase in size of a mixed density subdural hematoma overlying the left cerebral hemisphere as compared to the prior head CT of 09/23/2021. The subdural hematoma now measures up to 20 mm in thickness. Increased mass effect upon the underlying left cerebral hemisphere with near complete effacement of the left lateral ventricle. 13 mm rightward midline shift measured at the level of the septum pellucidum, also increased. Partial effacement the basal cisterns on the right, also progressed. 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 Unchanged punctate focus of parenchymal calcification within the mid to posterior left frontal lobe. No demarcated cortical infarct. No evidence of an intracranial mass. Vascular: Atherosclerotic calcifications. Embolization material along the course of the left middle meningeal artery. Skull: No fracture or aggressive osseous lesion. Sinuses/Orbits: No mass or acute finding within the imaged orbits. No significant paranasal  sinus disease at the imaged levels. These results were called by telephone at the time of interpretation on 10/05/2021 at 2:09 pm to provider Dr. Gustavus Messing, who verbally acknowledged these  results. IMPRESSION: A mixed density subdural hematoma overlying the left cerebral hemisphere has increased in size since the prior head CT of 09/23/2021, now measuring up to 20 mm in thickness (previously 13 mm). There is increased mass effect upon the underlying left cerebral hemisphere, now with 13 mm right midline shift (previously 6-7 mm). Partial effacement of the basal cisterns on the left, also progressed. Neurosurgical consultation is recommended. Background chronic small vessel ischemic disease, unchanged. Electronically Signed   By: Kellie Simmering D.O.   On: 10/05/2021 14:11   IR Transcath/Emboliz  Result Date: 09/29/2021 PROCEDURE: DIAGNOSTIC CEREBRAL ANGIOGRAM ONYX EMBOLIZATION OF LEFT MIDDLE MENINGEAL ARTERY HISTORY: The patient is a 61 year old man initially presenting to the hospital primarily with headache but no other neurologic symptoms. 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 Given the asymptomatic nature, middle meningeal artery embolization was requested. ACCESS: The technical aspects of the procedure as well as its potential risks and benefits were reviewed with the patient and his wife. These risks included but were not limited to stroke, intracranial hemorrhage, bleeding, infection, allergic reaction, damage to organs or vital structures, stroke, non-diagnostic procedure, and the catastrophic outcomes of heart attack, coma, and death. With an understanding of these risks, informed consent was obtained and witnessed. The patient was placed in the supine position on the angiography table and the skin of right groin prepped in the usual sterile fashion. The procedure was performed under general anesthesia. A 5-French sheath was introduced in the  right common femoral artery using Seldinger technique. MEDICATIONS: HEPARIN: 2000 Units total. CONTRAST:  36m OMNIPAQUE IOHEXOL 300 MG/ML  SOLNcc, Omnipaque 300 FLUOROSCOPY TIME:  FLUOROSCOPY TIME: See IR records TECHNIQUE: CATHETERS AND WIRES 5-French JB-1 catheter 180 cm 0.035" glidewire 280cm Glidewire [place] guide catheter Apollo microcatheter Synchro 10 microwire LIQUID EMBOLIC USED Onyx 18 VESSELS CATHETERIZED Left internal carotid Left external carotid Left middle meningeal artery VESSELS STUDIED Left internal carotid, head Left external carotid, head Left middle meningeal artery, head (pre embolization) Left external carotid artery, head (immediate post-embolization) Left internal carotid artery, head (final control) Right common femoral PROCEDURAL NARRATIVE A 5-Fr JB-1 glide catheter was advanced over a 0.035 glidewire into the aortic arch. The left internal carotid artery was selected and angiogram was taken. The left external carotid artery was also selected and angiogram taken. Under roadmap guidance, the glide catheter was exchanged for the Envoy guide catheter. This was placed in the proximal external carotid artery. Under roadmap guidance the Apollo microcatheter was advanced over the microwire into the left middle meningeal artery. Microcatheter run was taken. The catheter was then flushed with DMSO. The left middle meningeal artery was then embolized. The onyx was noted to be very difficult to inject through the Apollo microcatheter during embolization. I therefore completed the embolization and the microcatheter was removed without incident. On the back table, it did appear that the microcatheter was occluded just proximal to the tip. Immediate post embolization angiogram was taken of the left external carotid. The Glidewire was introduced and the left internal carotid artery was selected. Final control angiogram was taken. The guide catheter was then removed without incident.  FINDINGS: Left internal carotid, head: Injection reveals the presence of a widely patent ICA, A1, and M1 segments and their branches. No aneurysms, AVMs, or high-flow fistulas are seen. The capillary phase demonstrates significant mass effect upon the left cerebral hemisphere from the known chronic subdural hematoma. The venous sinuses  are widely patent. Left external carotid, head Visualized cranial branches of the left external carotid artery are unremarkable. Left middle meningeal artery, (pre embolization ) Microcatheter run taken from the left middle meningeal artery does not demonstrate any brain perfusion. There is some vascular blush to suggest supply to a chronic subdural hematoma membrane. There is no visualization of the dural venous sinuses. Left external carotid artery (immediate post embolization) Onyx cast is seen within the left middle meningeal artery extending down to the level of the foramen spinosum. The middle meningeal artery appears to be occluded at its origin. The remainder of the cranial branches of the left external carotid artery are unremarkable. Left internal carotid artery (final control) Left internal carotid artery is widely patent with normal bifurcation. The distal segments of the anterior and middle cerebral arteries are patent. Capillary phase does not demonstrate any perfusion deficits or branch occlusions. Again noted is the mass effect upon the left hemisphere. The venous sinuses are patent. Right femoral: Normal vessel. No significant atherosclerotic disease. Arterial sheath in adequate position. DISPOSITION: Upon completion of the study, the femoral sheath was removed and hemostasis obtained using a 5-Fr AngioSeal closure device. Good proximal and distal lower extremity pulses were documented upon achievement of hemostasis. The procedure was well tolerated and no early complications were observed. The patient was transferred to the postanesthesia care unit in stable  hemodynamic condition. IMPRESSION: 1. Successful onyx embolization of the left middle meningeal artery for chronic subdural hematoma The preliminary results of this procedure were shared with the patient's family. Electronically Signed   By: Consuella Lose   On: 09/29/2021 18:51   IR ANGIO INTRA EXTRACRAN SEL INTERNAL CAROTID UNI L MOD SED  Result Date: 09/29/2021 PROCEDURE: DIAGNOSTIC CEREBRAL ANGIOGRAM ONYX EMBOLIZATION OF LEFT MIDDLE MENINGEAL ARTERY HISTORY: The patient is a 61 year old man initially presenting to the hospital primarily with headache but no other neurologic symptoms. 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 Given the asymptomatic nature, middle meningeal artery embolization was requested. ACCESS: The technical aspects of the procedure as well as its potential risks and benefits were reviewed with the patient and his wife. These risks included but were not limited to stroke, intracranial hemorrhage, bleeding, infection, allergic reaction, damage to organs or vital structures, stroke, non-diagnostic procedure, and the catastrophic outcomes of heart attack, coma, and death. With an understanding of these risks, informed consent was obtained and witnessed. The patient was placed in the supine position on the angiography table and the skin of right groin prepped in the usual sterile fashion. The procedure was performed under general anesthesia. A 5-French sheath was introduced in the right common femoral artery using Seldinger technique. MEDICATIONS: HEPARIN: 2000 Units total. CONTRAST:  25m OMNIPAQUE IOHEXOL 300 MG/ML  SOLNcc, Omnipaque 300 FLUOROSCOPY TIME:  FLUOROSCOPY TIME: See IR records TECHNIQUE: CATHETERS AND WIRES 5-French JB-1 catheter 180 cm 0.035" glidewire 280cm Glidewire Five [place]oy guide catheter Apollo microcatheter Synchro 10 microwire LIQUID EMBOLIC USED Onyx 18 VESSELS CATHETERIZED Left  internal carotid Left external carotid Left middle meningeal artery VESSELS STUDIED Left internal carotid, head Left external carotid, head Left middle meningeal artery, head (pre embolization) Left external carotid artery, head (immediate post-embolization) Left internal carotid artery, head (final control) Right common femoral PROCEDURAL NARRATIVE A 5-Fr JB-1 glide catheter was advanced over a 0.035 glidewire into the aortic arch. The left internal carotid artery was selected and angiogram was taken. The left external carotid  artery was also selected and angiogram taken. Under roadmap guidance, the glide catheter was exchanged for the Envoy guide catheter. This was placed in the proximal external carotid artery. Under roadmap guidance the Apollo microcatheter was advanced over the microwire into the left middle meningeal artery. Microcatheter run was taken. The catheter was then flushed with DMSO. The left middle meningeal artery was then embolized. The onyx was noted to be very difficult to inject through the Apollo microcatheter during embolization. I therefore completed the embolization and the microcatheter was removed without incident. On the back table, it did appear that the microcatheter was occluded just proximal to the tip. Immediate post embolization angiogram was taken of the left external carotid. The Glidewire was introduced and the left internal carotid artery was selected. Final control angiogram was taken. The guide catheter was then removed without incident. FINDINGS: Left internal carotid, head: Injection reveals the presence of a widely patent ICA, A1, and M1 segments and their branches. No aneurysms, AVMs, or high-flow fistulas are seen. The capillary phase demonstrates significant mass effect upon the left cerebral hemisphere from the known chronic subdural hematoma. The venous sinuses are widely patent. Left external carotid, head Visualized cranial branches of the left external carotid  artery are unremarkable. Left middle meningeal artery, (pre embolization ) Microcatheter run taken from the left middle meningeal artery does not demonstrate any brain perfusion. There is some vascular blush to suggest supply to a chronic subdural hematoma membrane. There is no visualization of the dural venous sinuses. Left external carotid artery (immediate post embolization) Onyx cast is seen within the left middle meningeal artery extending down to the level of the foramen spinosum. The middle meningeal artery appears to be occluded at its origin. The remainder of the cranial branches of the left external carotid artery are unremarkable. Left internal carotid artery (final control) Left internal carotid artery is widely patent with normal bifurcation. The distal segments of the anterior and middle cerebral arteries are patent. Capillary phase does not demonstrate any perfusion deficits or branch occlusions. Again noted is the mass effect upon the left hemisphere. The venous sinuses are patent. Right femoral: Normal vessel. No significant atherosclerotic disease. Arterial sheath in adequate position. DISPOSITION: Upon completion of the study, the femoral sheath was removed and hemostasis obtained using a 5-Fr AngioSeal closure device. Good proximal and distal lower extremity pulses were documented upon achievement of hemostasis. The procedure was well tolerated and no early complications were observed. The patient was transferred to the postanesthesia care unit in stable hemodynamic condition. IMPRESSION: 1. Successful onyx embolization of the left middle meningeal artery for chronic subdural hematoma The preliminary results of this procedure were shared with the patient's family. Electronically Signed   By: Consuella Lose   On: 09/29/2021 18:51   IR ANGIO EXTERNAL CAROTID SEL EXT CAROTID UNI L MOD SED  Result Date: 09/29/2021 PROCEDURE: DIAGNOSTIC CEREBRAL ANGIOGRAM ONYX EMBOLIZATION OF LEFT MIDDLE  MENINGEAL ARTERY HISTORY: The patient is a 61 year old man initially presenting to the hospital primarily with headache but no other neurologic symptoms. 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 Given the asymptomatic nature, middle meningeal artery embolization was requested. ACCESS: The technical aspects of the procedure as well as its potential risks and benefits were reviewed with the patient and his wife. These risks included but were not limited to stroke, intracranial hemorrhage, bleeding, infection, allergic reaction, damage to organs or vital structures, stroke, non-diagnostic procedure, and the  catastrophic outcomes of heart attack, coma, and death. With an understanding of these risks, informed consent was obtained and witnessed. The patient was placed in the supine position on the angiography table and the skin of right groin prepped in the usual sterile fashion. The procedure was performed under general anesthesia. A 5-French sheath was introduced in the right common femoral artery using Seldinger technique. MEDICATIONS: HEPARIN: 2000 Units total. CONTRAST:  34m OMNIPAQUE IOHEXOL 300 MG/ML  SOLNcc, Omnipaque 300 FLUOROSCOPY TIME:  FLUOROSCOPY TIME: See IR records TECHNIQUE: CATHETERS AND WIRES 5-French JB-1 catheter 180 cm 0.035" glidewire 280cm Glidewire Five [place] guide catheter Apollo microcatheter Synchro 10 microwire LIQUID EMBOLIC USED Onyx 18 VESSELS CATHETERIZED Left internal carotid Left external carotid Left middle meningeal artery VESSELS STUDIED Left internal carotid, head Left external carotid, head Left middle meningeal artery, head (pre embolization) Left external carotid artery, head (immediate post-embolization) Left internal carotid artery, head (final control) Right common femoral PROCEDURAL NARRATIVE A 5-Fr JB-1 glide catheter was advanced over a 0.035 glidewire into the aortic arch. The left  internal carotid artery was selected and angiogram was taken. The left external carotid artery was also selected and angiogram taken. Under roadmap guidance, the glide catheter was exchanged for the Envoy guide catheter. This was placed in the proximal external carotid artery. Under roadmap guidance the Apollo microcatheter was advanced over the microwire into the left middle meningeal artery. Microcatheter run was taken. The catheter was then flushed with DMSO. The left middle meningeal artery was then embolized. The onyx was noted to be very difficult to inject through the Apollo microcatheter during embolization. I therefore completed the embolization and the microcatheter was removed without incident. On the back table, it did appear that the microcatheter was occluded just proximal to the tip. Immediate post embolization angiogram was taken of the left external carotid. The Glidewire was introduced and the left internal carotid artery was selected. Final control angiogram was taken. The guide catheter was then removed without incident. FINDINGS: Left internal carotid, head: Injection reveals the presence of a widely patent ICA, A1, and M1 segments and their branches. No aneurysms, AVMs, or high-flow fistulas are seen. The capillary phase demonstrates significant mass effect upon the left cerebral hemisphere from the known chronic subdural hematoma. The venous sinuses are widely patent. Left external carotid, head Visualized cranial branches of the left external carotid artery are unremarkable. Left middle meningeal artery, (pre embolization ) Microcatheter run taken from the left middle meningeal artery does not demonstrate any brain perfusion. There is some vascular blush to suggest supply to a chronic subdural hematoma membrane. There is no visualization of the dural venous sinuses. Left external carotid artery (immediate post embolization) Onyx cast is seen within the left middle meningeal artery extending  down to the level of the foramen spinosum. The middle meningeal artery appears to be occluded at its origin. The remainder of the cranial branches of the left external carotid artery are unremarkable. Left internal carotid artery (final control) Left internal carotid artery is widely patent with normal bifurcation. The distal segments of the anterior and middle cerebral arteries are patent. Capillary phase does not demonstrate any perfusion deficits or branch occlusions. Again noted is the mass effect upon the left hemisphere. The venous sinuses are patent. Right femoral: Normal vessel. No significant atherosclerotic disease. Arterial sheath in adequate position. DISPOSITION: Upon completion of the study, the femoral sheath was removed and hemostasis obtained using a 5-Fr AngioSeal closure device. Good proximal and distal lower  extremity pulses were documented upon achievement of hemostasis. The procedure was well tolerated and no early complications were observed. The patient was transferred to the postanesthesia care unit in stable hemodynamic condition. IMPRESSION: 1. Successful onyx embolization of the left middle meningeal artery for chronic subdural hematoma The preliminary results of this procedure were shared with the patient's family. Electronically Signed   By: Consuella Lose   On: 09/29/2021 18:51   IR Angiogram Follow Up Study  Result Date: 09/29/2021 PROCEDURE: DIAGNOSTIC CEREBRAL ANGIOGRAM ONYX EMBOLIZATION OF LEFT MIDDLE MENINGEAL ARTERY HISTORY: The patient is a 61 year old man initially presenting to the hospital primarily with headache but no other neurologic symptoms. 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 Given the asymptomatic nature, middle meningeal artery embolization was requested. ACCESS: The technical aspects of the procedure as well as its potential risks and benefits were reviewed with the patient and his wife.  These risks included but were not limited to stroke, intracranial hemorrhage, bleeding, infection, allergic reaction, damage to organs or vital structures, stroke, non-diagnostic procedure, and the catastrophic outcomes of heart attack, coma, and death. With an understanding of these risks, informed consent was obtained and witnessed. The patient was placed in the supine position on the angiography table and the skin of right groin prepped in the usual sterile fashion. The procedure was performed under general anesthesia. A 5-French sheath was introduced in the right common femoral artery using Seldinger technique. MEDICATIONS: HEPARIN: 2000 Units total. CONTRAST:  12m OMNIPAQUE IOHEXOL 300 MG/ML  SOLNcc, Omnipaque 300 FLUOROSCOPY TIME:  FLUOROSCOPY TIME: See IR records TECHNIQUE: CATHETERS AND WIRES 5-French JB-1 catheter 180 cm 0.035" glidewire 280cm Glidewire Five [place]oy guide catheter Apollo microcatheter Synchro 10 microwire LIQUID EMBOLIC USED Onyx 18 VESSELS CATHETERIZED Left internal carotid Left external carotid Left middle meningeal artery VESSELS STUDIED Left internal carotid, head Left external carotid, head Left middle meningeal artery, head (pre embolization) Left external carotid artery, head (immediate post-embolization) Left internal carotid artery, head (final control) Right common femoral PROCEDURAL NARRATIVE A 5-Fr JB-1 glide catheter was advanced over a 0.035 glidewire into the aortic arch. The left internal carotid artery was selected and angiogram was taken. The left external carotid artery was also selected and angiogram taken. Under roadmap guidance, the glide catheter was exchanged for the Envoy guide catheter. This was placed in the proximal external carotid artery. Under roadmap guidance the Apollo microcatheter was advanced over the microwire into the left middle meningeal artery. Microcatheter run was taken. The catheter was then flushed with DMSO. The left middle  meningeal artery was then embolized. The onyx was noted to be very difficult to inject through the Apollo microcatheter during embolization. I therefore completed the embolization and the microcatheter was removed without incident. On the back table, it did appear that the microcatheter was occluded just proximal to the tip. Immediate post embolization angiogram was taken of the left external carotid. The Glidewire was introduced and the left internal carotid artery was selected. Final control angiogram was taken. The guide catheter was then removed without incident. FINDINGS: Left internal carotid, head: Injection reveals the presence of a widely patent ICA, A1, and M1 segments and their branches. No aneurysms, AVMs, or high-flow fistulas are seen. The capillary phase demonstrates significant mass effect upon the left cerebral hemisphere from the known chronic subdural hematoma. The venous sinuses are widely patent. Left external carotid, head Visualized cranial branches of the left external carotid artery  are unremarkable. Left middle meningeal artery, (pre embolization ) Microcatheter run taken from the left middle meningeal artery does not demonstrate any brain perfusion. There is some vascular blush to suggest supply to a chronic subdural hematoma membrane. There is no visualization of the dural venous sinuses. Left external carotid artery (immediate post embolization) Onyx cast is seen within the left middle meningeal artery extending down to the level of the foramen spinosum. The middle meningeal artery appears to be occluded at its origin. The remainder of the cranial branches of the left external carotid artery are unremarkable. Left internal carotid artery (final control) Left internal carotid artery is widely patent with normal bifurcation. The distal segments of the anterior and middle cerebral arteries are patent. Capillary phase does not demonstrate any perfusion deficits or branch occlusions. Again  noted is the mass effect upon the left hemisphere. The venous sinuses are patent. Right femoral: Normal vessel. No significant atherosclerotic disease. Arterial sheath in adequate position. DISPOSITION: Upon completion of the study, the femoral sheath was removed and hemostasis obtained using a 5-Fr AngioSeal closure device. Good proximal and distal lower extremity pulses were documented upon achievement of hemostasis. The procedure was well tolerated and no early complications were observed. The patient was transferred to the postanesthesia care unit in stable hemodynamic condition. IMPRESSION: 1. Successful onyx embolization of the left middle meningeal artery for chronic subdural hematoma The preliminary results of this procedure were shared with the patient's family. Electronically Signed   By: Consuella Lose   On: 09/29/2021 18:51   IR NEURO EACH ADD'L AFTER BASIC UNI LEFT (MS)  Result Date: 09/29/2021 PROCEDURE: DIAGNOSTIC CEREBRAL ANGIOGRAM ONYX EMBOLIZATION OF LEFT MIDDLE MENINGEAL ARTERY HISTORY: The patient is a 60 year old man initially presenting to the hospital primarily with headache but no other neurologic symptoms. 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 Given the asymptomatic nature, middle meningeal artery embolization was requested. ACCESS: The technical aspects of the procedure as well as its potential risks and benefits were reviewed with the patient and his wife. These risks included but were not limited to stroke, intracranial hemorrhage, bleeding, infection, allergic reaction, damage to organs or vital structures, stroke, non-diagnostic procedure, and the catastrophic outcomes of heart attack, coma, and death. With an understanding of these risks, informed consent was obtained and witnessed. The patient was placed in the supine position on the angiography table and the skin of right groin prepped in the usual sterile  fashion. The procedure was performed under general anesthesia. A 5-French sheath was introduced in the right common femoral artery using Seldinger technique. MEDICATIONS: HEPARIN: 2000 Units total. CONTRAST:  35m OMNIPAQUE IOHEXOL 300 MG/ML  SOLNcc, Omnipaque 300 FLUOROSCOPY TIME:  FLUOROSCOPY TIME: See IR records TECHNIQUE: CATHETERS AND WIRES 5-French JB-1 catheter 180 cm 0.035" glidewire 280cm Glidewire Five [place] guide catheter Apollo microcatheter Synchro 10 microwire LIQUID EMBOLIC USED Onyx 18 VESSELS CATHETERIZED Left internal carotid Left external carotid Left middle meningeal artery VESSELS STUDIED Left internal carotid, head Left external carotid, head Left middle meningeal artery, head (pre embolization) Left external carotid artery, head (immediate post-embolization) Left internal carotid artery, head (final control) Right common femoral PROCEDURAL NARRATIVE A 5-Fr JB-1 glide catheter was advanced over a 0.035 glidewire into the aortic arch. The left internal carotid artery was selected and angiogram was taken. The left external carotid artery was also selected and angiogram taken. Under roadmap guidance, the glide catheter [facility]. This was placed in the proximal external carotid artery. Under roadmap guidance the Apollo microcatheter was advanced over the microwire into the left middle meningeal artery. Microcatheter run was taken. The catheter was then flushed with DMSO. The left middle meningeal artery was then embolized. The onyx was noted to be very difficult to inject through the Apollo microcatheter during embolization. I therefore completed the embolization and the microcatheter was removed without incident. On the back table, it did appear that the microcatheter was occluded just proximal to the tip. Immediate post embolization angiogram was taken of the left external carotid. The Glidewire was introduced and the left internal carotid artery  was selected. Final control angiogram was taken. The guide catheter was then removed without incident. FINDINGS: Left internal carotid, head: Injection reveals the presence of a widely patent ICA, A1, and M1 segments and their branches. No aneurysms, AVMs, or high-flow fistulas are seen. The capillary phase demonstrates significant mass effect upon the left cerebral hemisphere from the known chronic subdural hematoma. The venous sinuses are widely patent. Left external carotid, head Visualized cranial branches of the left external carotid artery are unremarkable. Left middle meningeal artery, (pre embolization ) Microcatheter run taken from the left middle meningeal artery does not demonstrate any brain perfusion. There is some vascular blush to suggest supply to a chronic subdural hematoma membrane. There is no visualization of the dural venous sinuses. Left external carotid artery (immediate post embolization) Onyx cast is seen within the left middle meningeal artery extending down to the level of the foramen spinosum. The middle meningeal artery appears to be occluded at its origin. The remainder of the cranial branches of the left external carotid artery are unremarkable. Left internal carotid artery (final control) Left internal carotid artery is widely patent with normal bifurcation. The distal segments of the anterior and middle cerebral arteries are patent. Capillary phase does not demonstrate any perfusion deficits or branch occlusions. Again noted is the mass effect upon the left hemisphere. The venous sinuses are patent. Right femoral: Normal vessel. No significant atherosclerotic disease. Arterial sheath in adequate position. DISPOSITION: Upon completion of the study, the femoral sheath was removed and hemostasis obtained using a 5-Fr AngioSeal closure device. Good proximal and distal lower extremity pulses were documented upon achievement of hemostasis. The procedure was well tolerated and no early  complications were observed. The patient was transferred to the postanesthesia care unit in stable hemodynamic condition. IMPRESSION: 1. Successful onyx embolization of the left middle meningeal artery for chronic subdural hematoma The preliminary results of this procedure were shared with the patient's family. Electronically Signed   By: Consuella Lose   On: 09/29/2021 18:51   CT HEAD WO CONTRAST (5MM)  Result Date: 09/23/2021 CLINICAL DATA:  61 year old male with headache and left side subdural hematoma. EXAM: CT HEAD WITHOUT CONTRAST TECHNIQUE: Contiguous axial images were obtained from the base of the skull through the vertex without intravenous contrast. RADIATION DOSE REDUCTION: This exam was performed according to the departmental dose-optimization program which includes automated exposure control, adjustment of the mA and/or kV according to patient size and/or use of iterative reconstruction technique. COMPARISON:  Brain MRI 09/22/2021. FINDINGS: Brain: Widespread left side subdural hematoma is mixed density, largely isodense in areas. Hemorrhage is stable an and configuration from the MRI yesterday measuring up to 13 mm in thickness (coronal image 40). Mass effect on the left hemisphere with rightward midline shift of 6-7 mm, stable. Stable mass effect on the left lateral ventricle. No  ventriculomegaly. Basilar cisterns remain patent. No new intracranial hemorrhage identified. Stable gray-white matter differentiation. Small chronic left basal ganglia and cerebellar lacunar infarcts. No cortically based acute infarct identified. Vascular: Calcified atherosclerosis at the skull base. Skull: Intact.  No acute osseous abnormality identified. Sinuses/Orbits: Visualized paranasal sinuses and mastoids are stable and well aerated. Trace right maxillary mucosal thickening. Other: Visualized orbits and scalp soft tissues are within normal limits. IMPRESSION: 1. Hemispheric left subdural hematoma is stable  yesterday, approximately 13 mm in thickness with intracranial mass effect including rightward midline shift of 6-7 mm. No skull fracture. 2. No new intracranial abnormality.  Chronic small vessel disease. Electronically Signed   By: Genevie Ann M.D.   On: 09/23/2021 07:16   MR Brain Wo Contrast  Result Date: 09/22/2021 CLINICAL DATA:  Headache, new or worsening. EXAM: MRI HEAD WITHOUT CONTRAST TECHNIQUE: Multiplanar, multiecho pulse sequences of the brain and surrounding structures were obtained without intravenous contrast. COMPARISON:  None FINDINGS: Brain: There is an acute subdural hematoma along the convexity on the left with maximal thickness of 14 mm. There is mass effect on the left hemisphere with left-to-right shift of 6 mm. The dentate nuclei regions of the cerebellum show either dilated perivascular spaces or old infarctions. There could be some calcification in this region. No supra tentorial stroke, intra-axial mass or hydrocephalus. Vascular: Major vessels at the base of the brain show flow. Skull and upper cervical spine: Negative Sinuses/Orbits: Mild mucosal thickening of the right maxillary sinus floor. Orbits negative. Other: None IMPRESSION: Acute left convexity subdural hematoma running from front to back with maximal thickness of 14 mm. Mass effect with left-to-right shift of 6 mm. We are trying to retrieve the patient, who was escorted towards the exit. Call report to ordering physician in progress. If the patient can be caught before he leaves, he will be taken to the emergency room. Electronically Signed   By: Nelson Chimes M.D.   On: 09/22/2021 18:03    Antibiotics:  Anti-infectives (From admission, onward)    Start     Dose/Rate Route Frequency Ordered Stop   10/05/21 1728  ceFAZolin (ANCEF) 2-4 GM/100ML-% IVPB       Note to Pharmacy: Ladoris Gene A: cabinet override      10/05/21 1728 10/05/21 1844       Discharge Exam: Blood pressure 126/62, pulse 61, temperature 97.8 F  (36.6 C), temperature source Oral, resp. rate 17, height '5\' 8"'$  (1.727 m), weight 90 kg, SpO2 98 %. Neurologic: Grossly normal Incision CDI  Discharge Medications:   Allergies as of 10/14/2021   No Known Allergies      Medication List     TAKE these medications    allopurinol 300 MG tablet Commonly known as: ZYLOPRIM TAKE 1 TABLET BY MOUTH EVERY DAY   atorvastatin 40 MG tablet Commonly known as: LIPITOR TAKE 1 TABLET BY MOUTH EVERY DAY   Auryxia 1 GM 210 MG(Fe) tablet Generic drug: ferric citrate Take 630 mg by mouth 3 (three) times daily with meals.   calcitRIOL 0.25 MCG capsule Commonly known as: ROCALTROL Take 0.25 mcg by mouth daily.   docusate sodium 100 MG capsule Commonly known as: COLACE Take 100 mg by mouth daily.   gentamicin ointment 0.1 % Commonly known as: GARAMYCIN Apply 1 application topically daily.   HYDROcodone-acetaminophen 5-325 MG tablet Commonly known as: NORCO/VICODIN Take 1 tablet by mouth every 6 (six) hours as needed for moderate pain or severe pain. 1-2 tabs po tid prn  iron sucrose in sodium chloride 0.9 % 100 mL Iron Sucrose (Venofer)   labetalol 100 MG tablet Commonly known as: NORMODYNE Take 100 mg by mouth 2 (two) times daily.   levETIRAcetam 250 MG tablet Commonly known as: Keppra Take 1 tablet (250 mg total) by mouth 2 (two) times daily. What changed:  medication strength how much to take   methocarbamol 500 MG tablet Commonly known as: ROBAXIN Take 2 tablets (1,000 mg total) by mouth 4 (four) times daily as needed (for Headache). START with 1 pill, if not too drowsy take 2 pills.   MIRCERA IJ Inject into the skin as needed.   torsemide 100 MG tablet Commonly known as: DEMADEX Take 1 tablet (100 mg total) by mouth daily.        Disposition: home   Final Dx: crani for L SDH  Discharge Instructions     Call MD for:  difficulty breathing, headache or visual disturbances   Complete by: As directed    Call MD  for:  persistant nausea and vomiting   Complete by: As directed    Call MD for:  redness, tenderness, or signs of infection (pain, swelling, redness, odor or green/yellow discharge around incision site)   Complete by: As directed    Call MD for:  severe uncontrolled pain   Complete by: As directed    Call MD for:  temperature >100.4   Complete by: As directed    Diet - low sodium heart healthy   Complete by: As directed    Discharge instructions   Complete by: As directed    No driving, may shower, no strenuous activity   Increase activity slowly   Complete by: As directed    No wound care   Complete by: As directed         Follow-up Information     Eustace Moore, MD. Schedule an appointment as soon as possible for a visit in 2 week(s).   Specialty: Neurosurgery Contact information: [address] [place] [address] [phone]                  Signed: WALLIS VANCOTT 10/14/2021, 8:06 AM

## 2021-10-14 NOTE — Progress Notes (Signed)
PROGRESS NOTE    Daniel HENNESSEE  KDT:[id number] DOB: [birth year] DOA: 10/05/2021 PCP: Marin Olp, MD    Brief Narrative:   Patient is is a is 61 years old male with past medical history of known subacute/chronic left subdural hematoma, ESRD, CAD, hyperlipidemia, aortic stenosis, paroxysmal atrial fibrillation, hypertension, obstructive sleep apnea, gout who was treated with middle meningeal artery embolism on 09/28/2021 by neurosurgery presented to neurosurgery service with increasing subdural hematoma with left to right shunt.  He did not complain of headache but was then admitted to the hospital for further evaluation and treatment.  Neurosurgery recommended operative evacuation/craniotomy.  Patient underwent a craniotomy initially on 8/ 8/23 and was put on ventilator and admitted to the ICU.  On 10/06/2021 patient was extubated.  On 10/07/2021 patient was taken to the OR for redo craniotomy given increased shift on the head CT scan.  Patient was subsequently extubated on 10/08/21 and was subsequently transferred out of the ICU.  Medical team was consulted for medical co-management.  Currently patient is being discharged home with a primary  Neuro surgical team.   Assessment and Plan:  Principal Problem:   S/P craniotomy Active Problems:   OSA (obstructive sleep apnea)   HTN (hypertension)   Gout   ESRD on peritoneal dialysis ([name])   Acute subdural hematoma ([name])   Left subacute subdural hematoma with brain compression.   Status post craniotomy on 10/05/21, then redo on 10/07/21.  Needed mechanical ventilation and intubation and ICU support.  Neurosurgery on board.  Continue Keppra.  Systolic blood pressure goal less than 140.  On as needed labetalol and oral labetalol. Continue torsemide.  Patient is on labetalol 100 mg twice a day at home.  Patient is currently able to ambulate just with the help of walker.  Neurosurgery team is discharging the patient home with home health services  today.  Dysphagia, poor oral intake, patient is currently on cortrak tube tube.  On dysphagia 2 diet.  Speech therapy following.  Family stated that he was able to eat better and would recommend cutting down on tube feeding and stopping tube feeding altogether if adequate intake and able to hold down.  Patient was able to tolerate a heart healthy diet.  Acute metabolic encephalopathy secondary to uremia, narcotics, untreated OSA with brain compression from SDH Improved mental status at this time.  Was able to continue to hold a conversation and answers my questions.  OSA> unable to use hospital masks due to craniotomy. Consider CPAP when safe with craniotomy.   Paroxysmal atrial fibrillation, previously on Eliquis. Eliquis on hold at this time.  Continue labetalol  CAD/Aortic stenosis, s/p TAVR Continue labetalol, torsemide   Hypertension Continue labetalol   Hyperlipidemia Continue Lipitor.   ESRD on Peritoneal dialysis, Nephrology on board.  On nocturnal peritoneal dialysis.  Gout -Continue allopurinol as ordered   DVT prophylaxis: SCDs Start: 10/05/21 2221  Code Status:     Code Status: Full Code  Disposition: CIR  Status is: Inpatient  Remains inpatient appropriate because: Status post subdural hematoma, need for CIR.   Family Communication:  I again spoke with the patient's wife at bedside.  Consultants:  Neurosurgery Critical care  Procedures:  Craniotomy with evacuation of subdural hematoma x2 Cortrak tube tube placement.  Antimicrobials:  None  Anti-infectives (From admission, onward)    Start     Dose/Rate Route Frequency Ordered Stop   10/05/21 1728  ceFAZolin (ANCEF) 2-4 GM/100ML-% IVPB       Note  to Pharmacy: Ladoris Gene A: cabinet override      10/05/21 1728 10/05/21 1844      Subjective:  Today, patient was seen and examined at bedside.  Denies any headache.  Patient's family at bedside and patient was walking comfortably with the help of  a walker without any support from the caregiver.  The family stated that he is ready to get discharged today.  And the primary neurosurgical team is discharging the patient home with home health services.    Objective: Vitals:   10/13/21 2021 10/14/21 0003 10/14/21 0315 10/14/21 0720  BP: (!) 154/93 (!) 129/92 (!) 146/97 126/62  Pulse: 90 74 81 61  Resp: '18 17 20 17  '$ Temp: 98 F (36.7 C) 98 F (36.7 C) 98.1 F (36.7 C) 97.8 F (36.6 C)  TempSrc: Oral Oral Oral Oral  SpO2: 100% 100% 100% 98%  Weight:      Height:        Intake/Output Summary (Last 24 hours) at 10/14/2021 1114 Last data filed at 10/14/2021 0915 Gross per 24 hour  Intake 240 ml  Output 35 ml  Net 205 ml    Filed Weights   10/07/21 1142 10/08/21 1500 10/11/21 0500  Weight: 86.3 kg 87.2 kg 90 kg    Physical Examination: Body mass index is 30.17 kg/m.   General: Obese built, not in obvious distress HENT:   No scleral pallor or icterus noted. Oral mucosa is moist.  PEG tube in place. Chest:  Clear breath sounds.  Diminished breath sounds bilaterally. No crackles or wheezes.  CVS: S1 &S2 heard. No murmur.  Regular rate and rhythm. Abdomen: Soft, nontender, nondistended.  Bowel sounds are heard.   Extremities: No cyanosis, clubbing or edema.  Peripheral pulses are palpable. Psych: Alert, awake and oriented, normal mood CNS:  No cranial nerve deficits.  Power equal in all extremities.  Scalp with staples. Skin: Warm and dry.  Scalp with staples.  Data Reviewed:   CBC: Recent Labs  Lab 10/08/21 0202 10/09/21 0547 10/10/21 0128 10/11/21 0714 10/13/21 0535  WBC 8.8 9.2 8.9 10.1 8.7  HGB 9.3* 8.6* 8.0* 8.5* 8.1*  HCT 27.0* 26.1* 23.2* 24.8* 24.2*  MCV 97.5 100.4* 97.9 97.6 100.0  PLT 96* 86* 102* 117* 127*     Basic Metabolic Panel: Recent Labs  Lab 10/09/21 0547 10/10/21 0128 10/11/21 0714 10/12/21 1228 10/13/21 0535  NA 140 142 142 142 139  K 4.4 3.6 4.0 4.1 4.0  CL 103 103 101 99 97*   CO2 21* '23 23 27 25  '$ GLUCOSE 119* 137* 134* 119* 151*  BUN 86* 85* 87* 90* 94*  CREATININE 15.22* 14.57* 14.34* 13.81* 13.51*  CALCIUM 8.8* 8.9 9.5 9.4 9.5  MG 2.3 2.3 2.3  --  2.4  PHOS 7.6* 7.4* 8.7* 8.6*  --      Liver Function Tests: Recent Labs  Lab 10/10/21 0128 10/11/21 0714 10/12/21 1228  ALBUMIN 2.4* 2.7* 2.6*      Radiology Studies: No results found.    LOS: 9 days    Carlyle Lipa, MD [facility] Available via Epic secure chat 7am-7pm After these hours, please refer to coverage provider listed on [email or web address] 10/14/2021, 11:14 AM

## 2021-10-14 NOTE — Progress Notes (Signed)
[facility] Progress Note  Subjective: patient seen and examined in room, currently running on PD. Wife at bedside as well. Doing well with PD, no complaints. Eager to go home.  Vitals:   10/13/21 2021 10/14/21 0003 10/14/21 0315 10/14/21 0720  BP: (!) 154/93 (!) 129/92 (!) 146/97 126/62  Pulse: 90 74 81 61  Resp: '18 17 20 17  '$ Temp: 98 F (36.7 C) 98 F (36.7 C) 98.1 F (36.7 C) 97.8 F (36.6 C)  TempSrc: Oral Oral Oral Oral  SpO2: 100% 100% 100% 98%  Weight:      Height:        Exam: Gen: NAD, walking around room HEENT: +NGT CV: RRR Resp: CTA bl Abd: soft, NT Ext: trace pedal edema (slightly improved) Neuro: awake, alert Dialysis access: RLQ PD  Recent Labs  Lab 10/11/21 0714 10/12/21 1228 10/13/21 0535  HGB 8.5*  --  8.1*  ALBUMIN 2.7* 2.6*  --   CALCIUM 9.5 9.4 9.5  PHOS 8.7* 8.6*  --   CREATININE 14.34* 13.81* 13.51*  K 4.0 4.1 4.0   Recent Labs  Lab 10/10/21 1608  IRON 70  TIBC 154*  FERRITIN 1,449*   Inpatient medications:  allopurinol  100 mg Oral Daily   atorvastatin  40 mg Oral Daily   calcitRIOL  0.25 mcg Oral Daily   Chlorhexidine Gluconate Cloth  6 each Topical Daily   darbepoetin (ARANESP) injection - NON-DIALYSIS  60 mcg Subcutaneous Q Sun-1800   feeding supplement  237 mL Oral TID BM   ferric citrate  840 mg Oral TID WC   gentamicin cream  1 Application Topical Daily   labetalol  100 mg Oral BID   levETIRAcetam  500 mg Oral BID   pantoprazole  40 mg Oral Daily   sennosides  5 mL Oral BID   torsemide  80 mg Oral Daily    sodium chloride 10 mL/hr at 10/05/21 1718   dialysis solution 1.5% low-MG/low-CA     acetaminophen **OR** acetaminophen, bisacodyl, heparin 2,500 Units in dialysis solution 1.5% low-MG/low-CA 5,000 mL dialysis solution, HYDROcodone-acetaminophen, labetalol, naLOXone (NARCAN)  injection, ondansetron **OR** ondansetron (ZOFRAN) IV   OP PD: CCPD [facility] 7d /wk 93kg  5 overnight, 3000 fill, last fill 2500 for  daybag - last Hb 11.6 on 7/7, tsat 14%, ferr 754 - no esa or IV fe currently    Assessment/ Plan: SDH w/ brain compression - sp SDH evac and craniotomy 8/8. Went back to OR 8/11 for hematoma evacuation.  Doing much better w/ mentation. Per NSGY & primary.   PAF - holding eliquis for now, per cards/ NSGY ESRD - on CCPD. Continue nightly CCPD-1.5% bags CAD hx PCI / sp TAVR HTN - on home labetalol 100 bid. torsemide '100mg'$  qd was on hold in the context of being under edw but restarted as below Volume - Use all 1.5% w/ PD. Torsemide had been discontinued. Does have some pedal edema. R/s'ed torsemide but at '80mg'$  daily on 8/16. 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 Started on esa w/ darbe 60 mcg weekly 8/13. fe studies: iron replete. Transfuse prn.  MBD ckd - CCa in range, phos is high. Cont daily vdra, and cont auryxia as binder. PO4 8.6 on 8/15, increased [place]

## 2021-10-14 NOTE — Progress Notes (Signed)
PD post treatment note:   PD treatment completed. Patient tolerated treatment well. PD effluent is clear. No specimen collected. PD exit site clean, dry and intact. Patient is awake, oriented and in no acute distress. Report given to bedside nurse.   Post treatment VS: SEE FLOWSHEET  Total UF removed:  35ML  Post treatment weight:

## 2021-10-14 NOTE — Progress Notes (Signed)
Discharge instructions (including medications) discussed with and copy provided to patient/caregiver. All daily medications given by primary RN prior to discharge.

## 2021-10-14 NOTE — Plan of Care (Signed)
Problem: Education: Goal: Knowledge of the prescribed therapeutic regimen will improve Outcome: Adequate for Discharge   Problem: Clinical Measurements: Goal: Usual level of consciousness will be regained or maintained. Outcome: Adequate for Discharge Goal: Neurologic status will improve Outcome: Adequate for Discharge Goal: Ability to maintain intracranial pressure will improve Outcome: Adequate for Discharge   Problem: Skin Integrity: Goal: Demonstration of wound healing without infection will improve Outcome: Adequate for Discharge   Problem: Activity: Goal: Ability to tolerate increased activity will improve Outcome: Adequate for Discharge   Problem: Respiratory: Goal: Ability to maintain a clear airway and adequate ventilation will improve Outcome: Adequate for Discharge   Problem: Role Relationship: Goal: Method of communication will improve Outcome: Adequate for Discharge   Problem: Acute Rehab PT Goals(only PT should resolve) Goal: Pt Will Go Supine/Side To Sit Outcome: Adequate for Discharge Goal: Pt Will Go Sit To Supine/Side Outcome: Adequate for Discharge Goal: Patient Will Transfer Sit To/From Stand Outcome: Adequate for Discharge Goal: Pt Will Transfer Bed To Chair/Chair To Bed Outcome: Adequate for Discharge Goal: Pt Will Ambulate Outcome: Adequate for Discharge Goal: Pt Will Go Up/Down Stairs Outcome: Adequate for Discharge   Problem: Increased Nutrient Needs (NI-5.1) Goal: Food and/or nutrient delivery Description: Individualized approach for food/nutrient provision. Outcome: Adequate for Discharge   Problem: Acute Rehab OT Goals (only OT should resolve) Goal: Pt. Will Perform Grooming Outcome: Adequate for Discharge Goal: Pt. Will Perform Upper Body Bathing Outcome: Adequate for Discharge Goal: Pt. Will Perform Upper Body Dressing Outcome: Adequate for Discharge Goal: Pt. Will Transfer To Toilet Outcome: Adequate for Discharge Goal: Pt. Will  Perform Toileting-Clothing Manipulation Outcome: Adequate for Discharge Goal: Pt/Caregiver Will Perform Home Exercise Program Outcome: Adequate for Discharge Goal: OT Additional ADL Goal #1 Outcome: Adequate for Discharge Goal: OT Additional ADL Goal #2 Outcome: Adequate for Discharge   Problem: Education: Goal: Knowledge of General Education information will improve Description: Including pain rating scale, medication(s)/side effects and non-pharmacologic comfort measures Outcome: Adequate for Discharge   Problem: Health Behavior/Discharge Planning: Goal: Ability to manage health-related needs will improve Outcome: Adequate for Discharge   Problem: Clinical Measurements: Goal: Ability to maintain clinical measurements within normal limits will improve Outcome: Adequate for Discharge Goal: Will remain free from infection Outcome: Adequate for Discharge Goal: Diagnostic test results will improve Outcome: Adequate for Discharge Goal: Respiratory complications will improve Outcome: Adequate for Discharge Goal: Cardiovascular complication will be avoided Outcome: Adequate for Discharge   Problem: Activity: Goal: Risk for activity intolerance will decrease Outcome: Adequate for Discharge   Problem: Nutrition: Goal: Adequate nutrition will be maintained Outcome: Adequate for Discharge   Problem: Coping: Goal: Level of anxiety will decrease Outcome: Adequate for Discharge   Problem: Elimination: Goal: Will not experience complications related to bowel motility Outcome: Adequate for Discharge Goal: Will not experience complications related to urinary retention Outcome: Adequate for Discharge   Problem: Pain Managment: Goal: General experience of comfort will improve Outcome: Adequate for Discharge   Problem: Safety: Goal: Ability to remain free from injury will improve Outcome: Adequate for Discharge   Problem: Skin Integrity: Goal: Risk for impaired skin integrity  will decrease Outcome: Adequate for Discharge   Problem: SLP Dysphagia Goals Goal: Patient will utilize recommended strategies Description: Patient will utilize recommended strategies during swallow to increase swallowing safety with Outcome: Adequate for Discharge   Problem: SLP Language Goals Goal: Misc Speech Goal #1 Outcome: Adequate for Discharge   Problem: SLP Dysphagia Goals Goal: Misc Dysphagia Goal Outcome: Adequate for Discharge

## 2021-10-14 NOTE — TOC Transition Note (Signed)
Transition of Care [facility]) - CM/SW Discharge Note   Patient Details  Name: TAMARI BUSIC MRN: [id number] Date of Birth: 11/25/1960  Transition of Care [facility]) CM/SW Contact:  Pollie Friar, RN Phone Number: 10/14/2021, 11:36 AM   Clinical Narrative:    Pt is discharging home with his spouse. Spouse is able to provide needed supervision at home.  Walker for home ordered through [place] and delivered to the room.  Home health arranged through [facility]. Information on the AVS. Wife providing transport home.    Final next level of care: Home w Home Health Services Barriers to Discharge: No Barriers Identified   Patient Goals and CMS Choice   CMS Medicare.gov Compare Post Acute Care list provided to:: Patient Represenative (must comment) Choice offered to / list presented to : Spouse  Discharge Placement                       Discharge Plan and Services                DME Arranged: Walker rolling DME Agency: [employer] Date DME Agency Contacted: 10/14/21   Representative spoke with at DME Agency: [place]: PT, OT HH Agency: [place] Date [place]: 10/14/21   Representative spoke with at [place]: Amy  Social Determinants of Health ([place]) Interventions     Readmission Risk Interventions     No data to display

## 2021-11-22 ENCOUNTER — Encounter: Payer: Self-pay | Admitting: *Deleted

## 2021-12-02 ENCOUNTER — Other Ambulatory Visit: Payer: Self-pay | Admitting: Neurosurgery

## 2021-12-02 DIAGNOSIS — Z9889 Other specified postprocedural states: Secondary | ICD-10-CM

## 2021-12-08 ENCOUNTER — Inpatient Hospital Stay: Admission: RE | Admit: 2021-12-08 | Payer: Managed Care, Other (non HMO) | Source: Ambulatory Visit

## 2021-12-15 ENCOUNTER — Ambulatory Visit
Admission: RE | Admit: 2021-12-15 | Discharge: 2021-12-15 | Disposition: A | Discharge status: Home / Self Care | Payer: Managed Care, Other (non HMO) | Source: Ambulatory Visit | Attending: Neurosurgery | Admitting: Neurosurgery

## 2021-12-15 DIAGNOSIS — Z9889 Other specified postprocedural states: Secondary | ICD-10-CM

## 2021-12-16 ENCOUNTER — Other Ambulatory Visit: Payer: Managed Care, Other (non HMO)

## 2022-01-03 ENCOUNTER — Other Ambulatory Visit: Payer: Self-pay | Admitting: Family Medicine

## 2022-01-18 NOTE — Progress Notes (Unsigned)
HEART AND [place]                                     Cardiology Office Note:    Date:  01/19/2022   ID:  Daniel Solomon, DOB 04-Aug-1960, MRN [id number]  PCP:  Marin Olp, MD  [facility] Cardiologist:  Lauree Chandler, MD / Dr. Cyndia Bent, MD (TAVR)  [facility] Electrophysiologist:  None   Referring MD: Marin Olp, MD   1 year s/p TAVR  History of Present Illness:    Daniel Solomon is a 61 y.o. male with a hx of CAD, HTN, HLD, sinus bradycardia, sleep apnea, thoracic aortic aneurysm, persistent atrial fibrillation on Eliquis, ESRD on PD in the work up for renal transplantation, recent SDH and bicuspid aortic valve with moderate-severe AS s/p TAVR (01/19/21) who presents to clinic for follow up.   Mr. Formisano was admitted 10/2010 with unstable angina and was found to have severe stenosis in the mid to distal LAD treated with a 2.25 x 18 mm Resolute DES. He was also found to have moderate mid LAD and distal RCA disease. Prior echocardiogram from 12/2015 showed an LVE at 55-60%, mild AS, mildly dilated aortic root. He previously wore an event monitor which showed atrial fibrillation and was started on Xarelto. Echo at [facility] 07/2020 showed moderate to severe AS with normal LV function. Images were not available for review however report was accessed in [place] by Dr. Angelena Form. Mean gradient at that time was up to 31.6 mmHg, AVA 0.8 cm2, dimensionless index 0.30. He was told that he could not have a kidney transplant at [facility] unless he had TAVR first.   He was evaluated by the multidisciplinary valve team and underwent successful TAVR with a 26 mm Edwards S3UR THV via the TF approach on 01/19/21. Post operative echo showed EF 60%, normally functioning TAVR with a mean gradient of 12 mmHg and no PVL. He was resumed on home renally dosed Eliquis. 1 month echo showed EF 60%, normally functioning TAVR with a  mean gradient of 18 mm hg and trivial PVL. Eliquis was increased to '5mg'$  daily (as this is the correct dosing: <80 yo, weight >60kg.)  The patient was admitted on 10/05/2021 for a left subdural hematoma s/p craniotomy by Dr Christella Noa. He developed post-op aphasia, CT showed large recurrent clot and he returned to the OR for repeat evacution. Discharged on 10/14/21. No longer on Eliquis or aspirin.   Today the patient presents to clinic for follow up. No CP or SOB. No LE edema, orthopnea or PND. No dizziness or syncope. No blood in stool or urine. No palpitations. He walks everyday for 20 minutes. Still talking about renal transplantation.   Past Medical History:  Diagnosis Date   Anemia    Bicuspid aortic valve    Blood transfusion without reported diagnosis    CAD (coronary artery disease)    proximal LAD 50%, mid LAD 50-60%, mid to distal LAD 99%; mid circumflex 20%; proximal RCA 20%, distal RCA 50%; PDA 60-70%, proximal posterior AV groove 50%, proximal posterior lateral 50%.  There was a question of possible LVOT gradient;  s/p Resolute DES to mid-distal LAD 10/2010;  echocardiogram 11/17/10: EF 55-60%, mild LVH, grade 1 diastolic dysfunction, normal aortic valve, mild MR, PASP 32.    Chicken pox    Enlarged aorta (  [place])    ESRD on peritoneal dialysis ([place])    Glaucoma    Gout    allopurinol '300mg'$ , colchicine prn in past, has not had flares   Headache    History of kidney stones    Hypercholesterolemia    With hypertriglyceridemia   Hypertension    Mild aortic stenosis    PAF (paroxysmal atrial fibrillation) ([place])    a. Documented on event monitor 01/2016.   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   SDH (subdural hematoma) ([place]) 08/2021   left   Sleep apnea    cpap nightly    Past Surgical History:  Procedure Laterality Date   CORONARY STENT  PLACEMENT  2012   CRANIOTOMY Left 10/05/2021   Procedure: CRANIOTOMY HEMATOMA EVACUATION SUBDURAL;  Surgeon: Ashok Pall, MD;  Location: [place];  Service: Neurosurgery;  Laterality: Left;   CRANIOTOMY Left 10/07/2021   Procedure: Craniectomy for Recurrent  Intracranial Hematoma;  Surgeon: Ashok Pall, MD;  Location: [place];  Service: Neurosurgery;  Laterality: Left;   CYSTOSCOPY/RETROGRADE/URETEROSCOPY  03/26/2012   Procedure: CYSTOSCOPY/RETROGRADE/URETEROSCOPY;  Surgeon: [name], MD;  Location: [facility];  Service: Urology;  Laterality: Bilateral;  CYSTOSCOPY BILATERAL RETROGRADE PYELOGRAM AND POSSIBLE URETEROSCOPY   INTRAOPERATIVE TRANSTHORACIC ECHOCARDIOGRAM N/A 01/19/2021   Procedure: INTRAOPERATIVE TRANSTHORACIC ECHOCARDIOGRAM;  Surgeon: Burnell Blanks, MD;  Location: [place];  Service: Open Heart Surgery;  Laterality: N/A;   IR ANGIO EXTERNAL CAROTID SEL EXT CAROTID UNI L MOD SED  09/28/2021   IR ANGIO INTRA EXTRACRAN SEL INTERNAL CAROTID UNI L MOD SED  09/28/2021   IR ANGIOGRAM FOLLOW UP STUDY  09/28/2021   IR ANGIOGRAM FOLLOW UP STUDY  09/30/2021   IR NEURO EACH ADD'L AFTER BASIC UNI LEFT (MS)  09/28/2021   IR NEURO EACH ADD'L AFTER BASIC UNI RIGHT (MS)  09/30/2021   IR TRANSCATH/EMBOLIZ  09/28/2021   RADIOLOGY WITH ANESTHESIA N/A 09/28/2021   Procedure: Middle meningeal artery embolization;  Surgeon: Consuella Lose, MD;  Location: [place];  Service: Radiology;  Laterality: N/A;   RIGHT HEART CATH AND CORONARY ANGIOGRAPHY N/A 12/04/2020   Procedure: RIGHT HEART CATH AND CORONARY ANGIOGRAPHY;  Surgeon: Burnell Blanks, MD;  Location: [place] [facility];  Service: Cardiovascular;  Laterality: N/A;   TRANSCATHETER AORTIC VALVE REPLACEMENT, TRANSFEMORAL N/A 01/19/2021   Procedure: TRANSCATHETER AORTIC VALVE REPLACEMENT, TRANSFEMORAL USING A 26MM EDWARDS VALVE.;  Surgeon: Burnell Blanks, MD;  Location: [place];  Service: Open Heart Surgery;  Laterality: N/A;     Current Medications: Current Meds  Medication Sig   allopurinol (ZYLOPRIM) 300 MG tablet TAKE 1 TABLET BY MOUTH EVERY DAY   atorvastatin (LIPITOR) 40 MG tablet TAKE 1 TABLET BY MOUTH EVERY DAY   AURYXIA 1 GM 210 MG(Fe) tablet Take 630 mg by mouth 3 (three) times daily with meals.   calcitRIOL (ROCALTROL) 0.25 MCG capsule Take 0.25 mcg by mouth daily.   docusate sodium (COLACE) 100 MG capsule Take 100 mg by mouth daily.   gentamicin ointment (GARAMYCIN) 0.1 % Apply 1 application topically daily.   HYDROcodone-acetaminophen (NORCO/VICODIN) 5-325 MG tablet Take 1 tablet by mouth every 6 (six) hours as needed for moderate pain or severe pain. 1-2 tabs po tid prn   iron sucrose in sodium chloride 0.9 % 100 mL Iron Sucrose (Venofer)   labetalol (NORMODYNE) 100 MG tablet  Take 100 mg by mouth 2 (two) times daily.   levETIRAcetam (KEPPRA) 250 MG tablet Take 1 tablet (250 mg total) by mouth 2 (two) times daily.   methocarbamol (ROBAXIN) 500 MG tablet Take 2 tablets (1,000 mg total) by mouth 4 (four) times daily as needed (for Headache). START with 1 pill, if not too drowsy take 2 pills.   Methoxy PEG-Epoetin Beta (MIRCERA IJ) Inject into the skin as needed.   torsemide (DEMADEX) 100 MG tablet Take 1 tablet (100 mg total) by mouth daily.     Allergies:   Patient has no known allergies.   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 There is no history of Colon cancer.  ROS:   Please see the history of present illness.    All other systems reviewed and are negative.  EKGs/Labs/Other Studies Reviewed:    The following studies were reviewed today:  TAVR OPERATIVE NOTE     Date of Procedure:                01/19/2021   Preoperative Diagnosis:      Severe Aortic Stenosis    Postoperative Diagnosis:    Same    Procedure:        Transcatheter Aortic Valve Replacement - Percutaneous Right Transfemoral Approach             Edwards Sapien 3 Ultra RSLTHV (size 26 mm, model # 9755RSL, serial # [id number])              Co-Surgeons:                        Gaye Pollack, MD and Lauree Chandler, MD     Anesthesiologist:                  Wilfrid Lund, MD   Echocardiographer:              Edmonia James, MD   Pre-operative Echo Findings: Severe aortic stenosis Normal left ventricular systolic function   Post-operative Echo Findings: No paravalvular leak Normal left ventricular systolic function  _____________   Echo 01/20/21:  IMPRESSIONS   1. Left ventricular ejection fraction, by estimation, is 60 to 65%. The  left ventricle has normal function. The left ventricle has no regional  wall motion abnormalities. There is mild left ventricular hypertrophy.  Left ventricular diastolic parameters  are indeterminate.   2.  Right ventricular systolic function is normal. The right ventricular  size is normal.   3. Left atrial size was moderately dilated.   4. Right atrial size was mildly dilated.   5. The mitral valve is abnormal. Mild mitral valve regurgitation. No  evidence of mitral stenosis.   6. Post TAVR with 26 mm Sapien 3 valve Deployment somewhat high but  leaflets appear covered on limited views and No PvL mean gradient 12 peak  22 mmHg AVA 2.7 cm2. The aortic valve has been repaired/replaced. Aortic  valve regurgitation is not visualized.   No aortic stenosis is present. There is a 26 mm Sapien prosthetic (TAVR)  valve present in the aortic position. Procedure Date: 01/19/2021.   7. Aortic dilatation noted. There is mild dilatation of the ascending  aorta, measuring 41 mm.   8. The inferior vena cava is normal in size with greater than 50%  respiratory variability, suggesting right atrial pressure of 3 mmHg.   _____________________  Echo 02/18/21 IMPRESSIONS   1. DVI 0.45. Peak gradient 27 mmHg. EOA 1.83 cm2. The aortic valve has  been replaced by a 26 mm Sapien valve. There is trivial PVL only seen in  the PLAX view. Aortic valve mean gradient measures 18.0 mmHg. Aortic valve  Vmax measures 2.47 m/s. Aortic  valve acceleration time measures 67 msec.   2. Left ventricular ejection fraction, by estimation, is 60 to 65%. The  left ventricle has normal function. The left ventricle has no regional  wall motion abnormalities. There is mild concentric left ventricular  hypertrophy. Left ventricular diastolic  parameters are indeterminate.   3. Right ventricular systolic function is normal. The right ventricular  size is normal. There is normal pulmonary artery systolic pressure.   4. Left atrial size was severely dilated.   5. The mitral valve is abnormal. Trivial mitral valve regurgitation. No  evidence of mitral stenosis.   6. Aortic dilatation noted. There is mild dilatation of the ascending   aorta, measuring 41 mm.   7. The inferior vena cava is normal in size with greater than 50%  respiratory variability, suggesting right atrial pressure of 3 mmHg.   Comparison(s): In the setting of atrial fibrillation and beat to beat  variation, likely prosthetic gradients from prior.   ___________________  Echo 01/19/22 IMPRESSIONS   1. Left ventricular ejection fraction, by estimation, is 60 to 65%. The left ventricle has normal function. The left ventricle has no regional wall motion abnormalities. There is mild left ventricular hypertrophy. Left ventricular diastolic function  could not be evaluated.  2. Right ventricular systolic function is normal. The right ventricular size is normal.  3. Left atrial size was moderately dilated.  4. The mitral valve is abnormal. Mild mitral valve regurgitation.  5. The aortic valve has been repaired/replaced. Aortic valve regurgitation is not visualized. 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 Echo findings are consistent with normal structure and function of the aortic  valve prosthesis. Aortic valve area, by VTI measures 2.56 cm. Aortic valve mean gradient measures 10.0 mmHg. Aortic valve Vmax measures 2.32 m/s. Peak gradient 21.5 mmHg, DI 0.48. No perivalvular leak.  6. Aortic dilatation noted. There is mild dilatation of the ascending aorta, measuring 43 mm.   Comparison(s): Changes from prior study are noted. 02/18/2021: LVEF 60-65%, TAVR mean gradient 18 mmHG, DI 0.45, trivial PVL, ascending aorta 41 mm.  EKG:  EKG is ordered today.  This shows afib with CVR  Recent Labs: 10/07/2021: ALT 10 10/13/2021: BUN 94; Creatinine, Ser 13.51; Hemoglobin 8.1; Magnesium 2.4; Platelets 127; Potassium 4.0; Sodium 139  Recent Lipid Panel    Component Value Date/Time   CHOL 95 (L) 08/28/2020 0737   TRIG 213 (H) 10/06/2021 0600   HDL 30 (L) 08/28/2020 0737   CHOLHDL 3.2 08/28/2020 0737   CHOLHDL 3 10/24/2018 1014    VLDL 37.4 10/24/2018 1014   LDLCALC 39 08/28/2020 0737   LDLDIRECT 70.9 02/10/2012 0737     Risk Assessment/Calculations:    CHA2DS2-VASc Score = 4   This indicates a 4.8% annual risk of stroke. The patient's score is based upon: CHF History: 1 HTN History: 1 Diabetes History: 0 Stroke History: 2 Vascular Disease History: 0 Age Score: 0 Gender Score: 0       Physical Exam:    VS:  BP 113/82   Pulse 88   Ht '5\' 8"'$  (1.727 m)   Wt 191 lb 6.4 oz (86.8 kg)   SpO2 98%   BMI 29.10 kg/m     Wt Readings from Last 3 Encounters:  01/19/22 191 lb 6.4 oz (86.8 kg)  10/11/21 198 lb 6.6 oz (90 kg)  09/28/21 194 lb (88 kg)     GEN:  Well nourished, well developed in no acute distress HEENT: Normal NECK: No JVD LYMPHATICS: No lymphadenopathy CARDIAC: irreg irreg, no murmur. no rubs, gallops RESPIRATORY:  Clear to auscultation without rales, wheezing or rhonchi  ABDOMEN: Soft, non-tender, non-distended MUSCULOSKELETAL:  No edema; No deformity  SKIN: Warm and dry NEUROLOGIC:  Alert and oriented x 3 PSYCHIATRIC:  Normal affect   ASSESSMENT:    1. S/P TAVR (transcatheter aortic valve replacement)   2. Essential hypertension   3. Persistent atrial fibrillation ([place])   4. Coronary artery disease involving native coronary artery of native heart without angina pectoris   5. ESRD on peritoneal dialysis ([place])   6. Renal mass   7. Subdural hematoma ([name])      PLAN:    In order of problems listed above:  Severe AS s/p TAVR: echo today shows EF 60%, normally functioning TAVR with a mean gradient of 10 mm hg and no PVL as well as mild MR and mild dilatation of the ascending aorta, measuring 43 mm. He has NYHA class I symptoms and walks everyday. SBE prophylaxis discussed; he has amoxicillin. He is no longer on Eliquis or aspirin due to spontaneous SDH necessitating craniotomy.  Continue regular follow up with Dr Angelena Form  HTN: BP stable. Continue current therapy  Atrial  fibrillation, persistent: he is no longer on Eliquis due to spontaneous SDH necessitating craniotomy. I will keep him off everything for now. I did discuss LAAO with [place] with him but he was not interested at this time.    CAD: denies anginal symptoms. Continue statin. No aspirin given SDH. Maybe we can add this in the future.   ESRD on PD: he is hoping to proceed with renal transplant at some point. He is cleared from a cardiac standpoint to proceed with this.    Renal mass: pre TAVR CTs showed an enlarging exophytic intermediate attenuation lesion in the lower pole of the left  kidney. Follow up MRI showed renal cyst and report sent to transplant team at [facility].  Subdural hematoma: s/p craniotomy. Was spontaneous. Now off all blood thinning medications.   Medication Adjustments/Labs and Tests Ordered: Current medicines are reviewed at length with the patient today.  Concerns regarding medicines are outlined above.  Orders Placed This Encounter  Procedures   EKG 12-Lead   No orders of the defined types were placed in this encounter.   Patient Instructions  Medication Instructions:   Your physician recommends that you continue on your current medications as directed. Please refer to the Current Medication list given to you today.   *If you need a refill on your cardiac medications before your next appointment, please call your pharmacy*   Lab Work: [place]   If you have labs (blood work) drawn today and your tests are completely normal, you will receive your results only by: [place] (if you have MyChart) OR A paper copy in the mail If you have any lab test that is abnormal or we need to change your treatment, we will call you to review the results.   Testing/Procedures: NONE ORDERED  TODAY    Follow-Up: At [facility], you and your health needs are our priority.  As part of our continuing mission to provide you with exceptional heart care,  we have created designated Provider Care Teams.  These Care Teams include your primary Cardiologist (physician) and Advanced Practice Providers (APPs -  Physician Assistants and Nurse Practitioners) who all work together to provide you with the care you need, when you need it.  We recommend signing up for the patient portal called "MyChart".  Sign up information is provided on this After Visit Summary.  MyChart is used to connect with patients for Virtual Visits (Telemedicine).  Patients are able to view lab/test results, encounter notes, upcoming appointments, etc.  Non-urgent messages can be sent to your provider as well.   To learn more about what you can do with MyChart, go to [email or web address].    Your next appointment:   FOLLOW UP WITH DR Angelena Form [place] 07-2022   The format for your next appointment:   In Person  Provider:   Lauree Chandler, MD     Other Instructions   Important Information About Sugar         Signed, Angelena Form, PA-C  01/19/2022 7:58 PM    [place]

## 2022-01-19 ENCOUNTER — Ambulatory Visit (HOSPITAL_BASED_OUTPATIENT_CLINIC_OR_DEPARTMENT_OTHER): Payer: Managed Care, Other (non HMO)

## 2022-01-19 ENCOUNTER — Ambulatory Visit (HOSPITAL_COMMUNITY): Payer: Managed Care, Other (non HMO) | Attending: Physician Assistant | Admitting: Physician Assistant

## 2022-01-19 VITALS — BP 113/82 | HR 88 | Ht 68.0 in | Wt 191.4 lb

## 2022-01-19 DIAGNOSIS — Z952 Presence of prosthetic heart valve: Secondary | ICD-10-CM | POA: Diagnosis present

## 2022-01-19 DIAGNOSIS — I10 Essential (primary) hypertension: Secondary | ICD-10-CM

## 2022-01-19 DIAGNOSIS — I2510 Atherosclerotic heart disease of native coronary artery without angina pectoris: Secondary | ICD-10-CM | POA: Diagnosis present

## 2022-01-19 DIAGNOSIS — I4819 Other persistent atrial fibrillation: Secondary | ICD-10-CM | POA: Diagnosis present

## 2022-01-19 DIAGNOSIS — N186 End stage renal disease: Secondary | ICD-10-CM | POA: Diagnosis present

## 2022-01-19 DIAGNOSIS — N2889 Other specified disorders of kidney and ureter: Secondary | ICD-10-CM | POA: Diagnosis present

## 2022-01-19 DIAGNOSIS — S065XAA Traumatic subdural hemorrhage with loss of consciousness status unknown, initial encounter: Secondary | ICD-10-CM | POA: Diagnosis present

## 2022-01-19 DIAGNOSIS — Z992 Dependence on renal dialysis: Secondary | ICD-10-CM | POA: Insufficient documentation

## 2022-01-19 DIAGNOSIS — S065XAD Traumatic subdural hemorrhage with loss of consciousness status unknown, subsequent encounter: Secondary | ICD-10-CM

## 2022-01-19 LAB — ECHOCARDIOGRAM COMPLETE
AR max vel: 2.38 cm2
AV Area VTI: 2.56 cm2
AV Area mean vel: 2.39 cm2
AV Mean grad: 10 mmHg
AV Peak grad: 21.5 mmHg
Ao pk vel: 2.32 m/s
S' Lateral: 2.2 cm

## 2022-01-19 NOTE — Patient Instructions (Signed)
Medication Instructions:   Your physician recommends that you continue on your current medications as directed. Please refer to the Current Medication list given to you today.   *If you need a refill on your cardiac medications before your next appointment, please call your pharmacy*   Lab Work: [place]   If you have labs (blood work) drawn today and your tests are completely normal, you will receive your results only by: [place] (if you have MyChart) OR A paper copy in the mail If you have any lab test that is abnormal or we need to change your treatment, we will call you to review the results.   Testing/Procedures: NONE ORDERED  TODAY    Follow-Up: At [facility], you and your health needs are our priority.  As part of our continuing mission to provide you with exceptional heart care, we have created designated Provider Care Teams.  These Care Teams include your primary Cardiologist (physician) and Advanced Practice Providers (APPs -  Physician Assistants and Nurse Practitioners) who all work together to provide you with the care you need, when you need it.  We recommend signing up for the patient portal called "MyChart".  Sign up information is provided on this After Visit Summary.  MyChart is used to connect with patients for Virtual Visits (Telemedicine).  Patients are able to view lab/test results, encounter notes, upcoming appointments, etc.  Non-urgent messages can be sent to your provider as well.   To learn more about what you can do with MyChart, go to [email or web address].    Your next appointment:   FOLLOW UP WITH DR Angelena Form IN 07-2022   The format for your next appointment:   In Person  Provider:   Lauree Chandler, MD     Other Instructions   Important Information About [place]

## 2022-01-21 ENCOUNTER — Other Ambulatory Visit: Payer: Self-pay

## 2022-01-21 ENCOUNTER — Ambulatory Visit
Admission: EM | Admit: 2022-01-21 | Discharge: 2022-01-21 | Disposition: A | Discharge status: Home / Self Care | Payer: Managed Care, Other (non HMO) | Attending: Nurse Practitioner | Admitting: Nurse Practitioner

## 2022-01-21 ENCOUNTER — Encounter: Payer: Self-pay | Admitting: Emergency Medicine

## 2022-01-21 DIAGNOSIS — R300 Dysuria: Secondary | ICD-10-CM | POA: Diagnosis not present

## 2022-01-21 DIAGNOSIS — K6289 Other specified diseases of anus and rectum: Secondary | ICD-10-CM | POA: Diagnosis not present

## 2022-01-21 LAB — POCT URINALYSIS DIP (MANUAL ENTRY)
Bilirubin, UA: NEGATIVE
Glucose, UA: NEGATIVE mg/dL
Ketones, POC UA: NEGATIVE mg/dL
Nitrite, UA: NEGATIVE
Protein Ur, POC: 100 mg/dL — AB
Spec Grav, UA: 1.015 (ref 1.010–1.025)
Urobilinogen, UA: 0.2 E.U./dL
pH, UA: 6 (ref 5.0–8.0)

## 2022-01-21 MED ORDER — SULFAMETHOXAZOLE-TRIMETHOPRIM 400-80 MG PO TABS: 1.0000 | ORAL_TABLET | Freq: Two times a day (BID) | ORAL | 0 refills | Status: AC

## 2022-01-21 NOTE — Discharge Instructions (Addendum)
The urine sample today shows a little bit of blood, protein, and leukocyte esterase.  I suspect there is inflammation in your prostate that is causing possible urinary retention and the urinary symptoms you are having.  Please start on the Bactrim take as prescribed.  We will call you in a couple of days if we need to change the antibiotic.  If your symptoms do not improve with this medicine after 48 hours or if they worsen and you develop fever, nausea/vomiting and are unable to keep fluids down, or severe pain, please call 911 or go to the emergency room.

## 2022-01-21 NOTE — ED Provider Notes (Signed)
[facility]    CSN: [id number] Arrival date & time: 01/21/22  2683      History   Chief Complaint Chief Complaint  Patient presents with   Dysuria    HPI Daniel Solomon is a 61 y.o. male.   Patient presents for 5 days of burning with urination, voiding smaller amounts than normal, abrupt nausea and vomiting twice since symptoms started with blood in the vomit.  He denies urinary frequency, urgency, new urinary incontinence, change in the odor of his urine, hematuria, abdominal or back pain, suprapubic pain/pressure, flank pain, and fever, body aches, or chills.  No penile discharge.  Reports he is currently sexually active, no recent partner change and no concern about STI currently.  Reports the past couple of days, it has been painful to have a bowel movement in his rectum.  He is eating and drinking fluid and foods normally.  No history of similar.  Medical history significant for severe aortic stenosis status post TAVR, recent acute subdural hematoma s/p craniotomy, end-stage renal disease on peritoneal dialysis.  He performs peritoneal dialysis at home every night and denies any recent changes in the fluid that is drawn off of his abdomen, it is not cloudy, odorous.  No irritation on his peritoneal dialysis catheter.  He reports a medical history significant for enlarged prostate/BPH.     Past Medical History:  Diagnosis Date   Anemia    Bicuspid aortic valve    Blood transfusion without reported diagnosis    CAD (coronary artery disease)    proximal LAD 50%, mid LAD 50-60%, mid to distal LAD 99%; mid circumflex 20%; proximal RCA 20%, distal RCA 50%; PDA 60-70%, proximal posterior AV groove 50%, proximal posterior lateral 50%.  There was a question of possible LVOT gradient;  s/p Resolute DES to mid-distal LAD 10/2010;  echocardiogram 11/17/10: EF 55-60%, mild LVH, grade 1 diastolic dysfunction, normal aortic valve, mild MR, PASP 32.    Chicken pox    Enlarged aorta  ([name])    ESRD on peritoneal dialysis ([name])    Glaucoma    Gout    allopurinol '300mg'$ , colchicine prn in past, has not had flares   Headache    History of kidney stones    Hypercholesterolemia    With hypertriglyceridemia   Hypertension    Mild aortic stenosis    PAF (paroxysmal atrial fibrillation) ([place])    a. Documented on event monitor 01/2016.   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   Subdural hematoma ([place]) 09/22/2021   S/P TAVR (transcatheter aortic valve replacement) 01/19/2021   Severe aortic stenosis    Allergy, unspecified, sequela 03/20/2020   Anemia in chronic kidney disease 03/20/2020   Coagulation defect, unspecified ([place]) 03/20/2020   ESRD on peritoneal dialysis ([place]) 03/20/2020   History of urinary stone 03/20/2020   Iron deficiency anemia, unspecified 03/20/2020   Nephrotic syndrome with focal and segmental glomerular lesions 03/20/2020   Secondary hyperparathyroidism of renal origin ([place]) 03/20/2020   Dilated aortic root ([place]) 12/14/2017   Morbid obesity ([place]) 10/04/2016  Paroxysmal atrial fibrillation ([place]) 03/02/2016   [place] retinal artery occlusion 03/01/2016   Mild aortic stenosis 08/11/2014   Gout    Carotid bruit 02/09/2012   OSA (obstructive sleep apnea) 12/06/2010   CAD (coronary artery disease) 12/06/2010   Mixed hyperlipidemia 12/06/2010   HTN (hypertension) 12/06/2010   Bradycardia 12/06/2010    Past Surgical History:  Procedure Laterality Date   CORONARY STENT PLACEMENT  2012   CRANIOTOMY Left 10/05/2021   Procedure: CRANIOTOMY HEMATOMA EVACUATION  SUBDURAL;  Surgeon: Ashok Pall, MD;  Location: [facility];  Service: Neurosurgery;  Laterality: Left;   CRANIOTOMY Left 10/07/2021   Procedure: Craniectomy for Recurrent  Intracranial Hematoma;  Surgeon: Ashok Pall, MD;  Location: [place];  Service: Neurosurgery;  Laterality: Left;   CYSTOSCOPY/RETROGRADE/URETEROSCOPY  03/26/2012   Procedure: CYSTOSCOPY/RETROGRADE/URETEROSCOPY;  Surgeon: [name], MD;  Location: [facility];  Service: Urology;  Laterality: Bilateral;  CYSTOSCOPY BILATERAL RETROGRADE PYELOGRAM AND POSSIBLE URETEROSCOPY   INTRAOPERATIVE TRANSTHORACIC ECHOCARDIOGRAM N/A 01/19/2021   Procedure: INTRAOPERATIVE TRANSTHORACIC ECHOCARDIOGRAM;  Surgeon: Burnell Blanks, MD;  Location: [facility];  Service: Open Heart Surgery;  Laterality: N/A;   IR ANGIO EXTERNAL CAROTID SEL EXT CAROTID UNI L MOD SED  09/28/2021   IR ANGIO INTRA EXTRACRAN SEL INTERNAL CAROTID UNI L MOD SED  09/28/2021   IR ANGIOGRAM FOLLOW UP STUDY  09/28/2021   IR ANGIOGRAM FOLLOW UP STUDY  09/30/2021   IR NEURO EACH ADD'L AFTER BASIC UNI LEFT (MS)  09/28/2021   IR NEURO EACH ADD'L AFTER BASIC UNI RIGHT (MS)  09/30/2021   IR TRANSCATH/EMBOLIZ  09/28/2021   RADIOLOGY WITH ANESTHESIA N/A 09/28/2021   Procedure: Middle meningeal artery embolization;  Surgeon: Consuella Lose, MD;  Location: [facility];  Service: Radiology;  Laterality: N/A;   RIGHT HEART CATH AND CORONARY ANGIOGRAPHY N/A 12/04/2020   Procedure: RIGHT HEART CATH AND CORONARY ANGIOGRAPHY;  Surgeon: Burnell Blanks, MD;  Location: [facility];  Service: Cardiovascular;  Laterality: N/A;   TRANSCATHETER AORTIC VALVE REPLACEMENT, TRANSFEMORAL N/A 01/19/2021   Procedure: TRANSCATHETER AORTIC VALVE REPLACEMENT, TRANSFEMORAL USING A 26MM EDWARDS VALVE.;  Surgeon: Burnell Blanks, MD;  Location: [facility];  Service: Open Heart Surgery;  Laterality: N/A;       Home Medications    Prior to Admission medications   Medication Sig Start Date  End Date Taking? Authorizing Provider  allopurinol (ZYLOPRIM) 300 MG tablet TAKE 1 TABLET BY MOUTH EVERY DAY 01/04/22   Marin Olp, MD  atorvastatin (LIPITOR) 40 MG tablet TAKE 1 TABLET BY MOUTH EVERY DAY 01/04/22   Marin Olp, MD  AURYXIA 1 GM 210 MG(Fe) tablet Take 630 mg by mouth 3 (three) times daily with meals. 12/23/20   [provider]  calcitRIOL (ROCALTROL) 0.25 MCG capsule Take 0.25 mcg by mouth daily. 01/29/21   [provider]  docusate sodium (COLACE) 100 MG capsule Take 100 mg by mouth daily.    [provider]  gentamicin ointment (GARAMYCIN) 0.1 % Apply 1 application topically daily.    [provider]  HYDROcodone-acetaminophen (NORCO/VICODIN) 5-325 MG tablet Take 1 tablet by mouth every 6 (six) hours as needed for moderate pain or severe pain. 1-2 tabs po tid prn 10/14/21   Eustace Moore, MD  iron sucrose in sodium chloride 0.9 % 100 mL Iron Sucrose (Venofer) 11/23/20   [provider]  labetalol (NORMODYNE) 100 MG tablet Take 100 mg by mouth 2 (two) times daily. 04/30/20   [provider]  levETIRAcetam (KEPPRA) 250 MG tablet Take 1 tablet (250 mg total) by mouth 2 (two) times daily. 10/14/21   Eustace Moore, MD  methocarbamol (ROBAXIN) 500 MG tablet Take 2 tablets (1,000 mg total) by mouth 4 (four) times daily as needed (for Headache). START with 1 pill, if not too drowsy take 2 pills. 09/09/21   Jeanie Sewer, NP  Methoxy PEG-Epoetin Beta (MIRCERA IJ) Inject into the skin as needed. 06/15/20   [provider]  sulfamethoxazole-trimethoprim (BACTRIM) 400-80 MG tablet Take 1 tablet by mouth 2 (two) times daily for 28 days. 01/21/22 02/18/22 Yes Eulogio Bear, NP  torsemide (DEMADEX) 100 MG tablet Take 1 tablet (100 mg total) by mouth daily. 08/25/21   Burnell Blanks, MD    Family History Family History  Problem Relation Age of Onset   Hypertension Father    CAD Father        CABG 82s    Kidney disease Father        Stage IV   Valvular heart disease Mother        AVR July 2015   Diabetes Paternal Grandmother    Colon cancer Neg Hx     Social History Social History   Tobacco Use   Smoking status: Never   Smokeless tobacco: Never  Vaping Use   Vaping Use: Never used  Substance Use Topics   Alcohol use: No   Drug use: No     Allergies   Patient has no known allergies.   Review of Systems Review of Systems Per HPI  Physical Exam Triage Vital Signs ED Triage Vitals [01/21/22 1010]  Enc Vitals Group     BP 123/73     Pulse Rate 93     Resp 20     Temp 98.3 F (36.8 C)     Temp Source Oral     SpO2 96 %     Weight      Height      Head Circumference      Peak Flow      Pain Score 10     Pain Loc      Pain Edu?      Excl. in [place]?    No data found.  Updated Vital Signs BP 123/73 (BP Location: Right Arm)   Pulse 93   Temp 98.3 F (36.8 C) (Oral)   Resp 20   SpO2 96%   Visual Acuity Right Eye Distance:   Left Eye Distance:   Bilateral Distance:    Right Eye Near:   Left Eye Near:    Bilateral Near:     Physical Exam Vitals and nursing note reviewed.  Constitutional:      General: He is not in acute distress.    Appearance: Normal appearance. He is not toxic-appearing.  HENT:     Mouth/Throat:     Mouth: Mucous membranes are moist.     Pharynx: Oropharynx is clear.  Cardiovascular:     Rate and Rhythm: Normal rate and regular rhythm.  Pulmonary:     Effort: Pulmonary effort is normal. No respiratory distress.     Breath sounds: Normal breath sounds. No wheezing, rhonchi or rales.  Abdominal:     General: Abdomen is flat. Bowel sounds are normal. There is no distension.     Palpations: Abdomen is soft.     Tenderness: There is no abdominal tenderness. There is no right CVA tenderness, left CVA tenderness or guarding.  Skin:  General: Skin is warm and dry.     Coloration: Skin is not jaundiced or pale.     Findings: No  erythema.  Neurological:     Mental Status: He is alert and oriented to person, place, and time.  Psychiatric:        Behavior: Behavior is cooperative.      UC Treatments / Results  Labs (all labs ordered are listed, but only abnormal results are displayed) Labs Reviewed  POCT URINALYSIS DIP (MANUAL ENTRY) - Abnormal; Notable for the following components:      Result Value   Blood, UA small (*)    Protein Ur, POC =100 (*)    Leukocytes, UA Small (1+) (*)    All other components within normal limits  URINE CULTURE    EKG   Radiology  Procedures Procedures (including critical care time)  Medications Ordered in UC Medications - No data to display  Initial Impression / Assessment and Plan / UC Course  I have reviewed the triage vital signs and the nursing notes.  Pertinent labs & imaging results that were available during my care of the patient were reviewed by me and considered in my medical decision making (see chart for details).   Patient is well-appearing, normotensive, afebrile, not tachycardic, not tachypneic, oxygenating well on room air.    Dysuria Pain, rectal Urinalysis today stable when compared with previous with exception of small amount of leukocyte Estrace Urine culture is pending I am concerned for acute cystitis, possibly secondary to prostatitis Given renal function, will treat with Bactrim single strength twice daily x 4 weeks Strict ER precautions discussed with patient  The patient was given the opportunity to ask questions.  All questions answered to their satisfaction.  The patient is in agreement to this plan.    Final Clinical Impressions(s) / UC Diagnoses   Final diagnoses:  Dysuria  Pain, rectal     Discharge Instructions      The urine sample today shows a little bit of blood, protein, and leukocyte esterase.  I suspect there is inflammation in your prostate that is causing possible urinary retention and the urinary symptoms you  are having.  Please start on the Bactrim take as prescribed.  We will call you in a couple of days if we need to change the antibiotic.  If your symptoms do not improve with this medicine after 48 hours or if they worsen and you develop fever, nausea/vomiting and are unable to keep fluids down, or severe pain, please call 911 or go to the emergency room.     ED Prescriptions     Medication Sig Dispense Auth. Provider   sulfamethoxazole-trimethoprim (BACTRIM) 400-80 MG tablet Take 1 tablet by mouth 2 (two) times daily for 28 days. 56 tablet Eulogio Bear, NP      PDMP not reviewed this encounter.   Eulogio Bear, NP 01/21/22 1158

## 2022-01-21 NOTE — ED Notes (Signed)
Pt unable to provide urine sample at this time. Pt given ice water.

## 2022-01-21 NOTE — ED Triage Notes (Signed)
Pt reports urinary frequency but reports decreased output with voiding and dysuria.denies any known fevers, back pain, abd pain.

## 2022-01-24 ENCOUNTER — Telehealth: Payer: Self-pay | Admitting: Family Medicine

## 2022-01-24 LAB — URINE CULTURE

## 2022-01-24 NOTE — Telephone Encounter (Signed)
Pt was seen in [facility] on 01/21/22  Patient Name: Daniel Solomon Gender: Male DOB: 09-Dec-1960 Age: 61 Y 10 M 29 D Return Phone Number: [phone] (Primary) Address: City/ State/ Zip: [place] [address] Client [facility] Client [facility] Provider Garret Reddish- MD Contact Type Call Who Is Calling Patient / Member / Family / Caregiver Call Type Triage / Clinical Relationship To Patient Self Return Phone Number [phone] (Primary) Chief Complaint Urination Frequency Reason for Call Symptomatic / Request for [name] states he is having trouble urinating over past week. Translation No Nurse Assessment Nurse: Claiborne Billings, RN, Kim Date/Time (Eastern Time): 01/21/2022 8:43:26 AM Confirm and document reason for call. If symptomatic, describe symptoms. ---Caller states he has had trouble urinating over past week, doesn't feel like he's emptying his bladder, painful to urinate. Does the patient have any new or worsening symptoms? ---Yes Will a triage be completed? ---Yes Related visit to physician within the last 2 weeks? ---No Does the PT have any chronic conditions? (i.e. diabetes, asthma, this includes High risk factors for pregnancy, etc.) ---Yes Is this a behavioral health or substance abuse call? ---No Guidelines Guideline Title Affirmed Question Affirmed Notes Nurse Date/Time Eilene Ghazi Time) Urination Pain - Male Vomiting Suzette Battiest 01/21/2022 8:44:48 AM Disp. Time Eilene Ghazi Time) Disposition Final User 01/21/2022 8:45:52 AM Go to ED Now (or PCP triage) Yes Claiborne Billings, RN, Kim Final Disposition 01/21/2022 8:45:52 AM Go to ED Now (or PCP triage) Yes Claiborne Billings, RN, Max Sane Disagree/Comply Comply Caller Understands Yes PreDisposition Did not know what to do Care Advice Given Per Guideline GO TO ED NOW (OR PCP TRIAGE): * IF NO PCP (PRIMARY CARE  PROVIDER) SECOND-LEVEL TRIAGE: You need to be seen within the next hour. Go to the [facility] at _____________ [place] as soon as you can. CARE ADVICE given per Urination Pain - Male (Adult) guideline. Referrals [facility] [place]

## 2022-01-24 NOTE — Telephone Encounter (Signed)
Patient states an ov is not needed at the time - he will call if ill or unwell.

## 2022-01-24 NOTE — Telephone Encounter (Signed)
Please schedule f/u ov for pt.

## 2022-01-28 ENCOUNTER — Ambulatory Visit (HOSPITAL_COMMUNITY)
Admission: RE | Admit: 2022-01-28 | Discharge: 2022-01-28 | Disposition: A | Discharge status: Home / Self Care | Payer: Managed Care, Other (non HMO) | Source: Ambulatory Visit | Attending: Neurosurgery | Admitting: Neurosurgery

## 2022-01-28 ENCOUNTER — Other Ambulatory Visit (HOSPITAL_COMMUNITY): Payer: Self-pay | Admitting: Neurosurgery

## 2022-01-28 DIAGNOSIS — S065XAA Traumatic subdural hemorrhage with loss of consciousness status unknown, initial encounter: Secondary | ICD-10-CM | POA: Insufficient documentation

## 2022-02-11 ENCOUNTER — Other Ambulatory Visit: Payer: Self-pay | Admitting: Neurosurgery

## 2022-02-16 NOTE — Pre-Procedure Instructions (Signed)
Surgical Instructions    Your procedure is scheduled on February 25, 2022.  Report to [facility] Main Entrance "A" at 5:30 A.M., then check in with the Admitting office.  Call this number if you have problems the morning of surgery:  [phone]   If you have any questions prior to your surgery date call [phone]: Open Monday-Friday 8am-4pm    Remember:  Do not eat after midnight the night before your surgery  You may drink clear liquids until 4:30 AM the morning of your surgery.   Clear liquids allowed are: Water, Non-Citrus Juices (without pulp), Carbonated Beverages, Clear Tea, Black Coffee Only (NO MILK, CREAM OR POWDERED CREAMER of any kind), and Gatorade.     Take these medicines the morning of surgery with A SIP OF WATER:  allopurinol (ZYLOPRIM)   atorvastatin (LIPITOR)   docusate sodium (COLACE)   labetalol (NORMODYNE)   loratadine (CLARITIN)   sulfamethoxazole-trimethoprim (BACTRIM)     As of today, STOP taking any Aspirin (unless otherwise instructed by your surgeon) Aleve, Naproxen, Ibuprofen, Motrin, Advil, Goody's, BC's, all herbal medications, fish oil, and all vitamins.                     Do NOT Smoke (Tobacco/Vaping) for 24 hours prior to your procedure.  If you use a CPAP at night, you may bring your mask/headgear for your overnight stay.   Contacts, glasses, piercing's, hearing aid's, dentures or partials may not be worn into surgery, please bring cases for these belongings.    For patients admitted to the hospital, discharge time will be determined by your treatment team.   Patients discharged the day of surgery will not be allowed to drive home, and someone needs to stay with them for 24 hours.  SURGICAL WAITING ROOM VISITATION Patients having surgery or a procedure may have no more than 2 support people in the waiting area - these visitors may rotate.   Children under the age of 41 must have an adult with them who is not the patient. If the  patient needs to stay at the hospital during part of their recovery, the visitor guidelines for inpatient rooms apply. Pre-op nurse will coordinate an appropriate time for 1 support person to accompany patient in pre-op.  This support person may not rotate.   Please refer to the [facility] website for the visitor guidelines for Inpatients (after your surgery is over and you are in a regular room).    Special instructions:   [place]- Preparing For Surgery  Before surgery, you can play an important role. Because skin is not sterile, your skin needs to be as free of germs as possible. You can reduce the number of germs on your skin by washing with CHG (chlorahexidine gluconate) Soap before surgery.  CHG is an antiseptic cleaner which kills germs and bonds with the skin to continue killing germs even after washing.    Oral Hygiene is also important to reduce your risk of infection.  Remember - BRUSH YOUR TEETH THE MORNING OF SURGERY WITH YOUR REGULAR TOOTHPASTE  Please do not use if you have an allergy to CHG or antibacterial soaps. If your skin becomes reddened/irritated stop using the CHG.  Do not shave (including legs and underarms) for at least 48 hours prior to first CHG shower. It is OK to shave your face.  Please follow these instructions carefully.   Shower the NIGHT BEFORE SURGERY and the MORNING OF SURGERY  If you chose to  wash your hair, wash your hair first as usual with your normal shampoo.  After you shampoo, rinse your hair and body thoroughly to remove the shampoo.  Use CHG Soap as you would any other liquid soap. You can apply CHG directly to the skin and wash gently with a scrungie or a clean washcloth.   Apply the CHG Soap to your body ONLY FROM THE NECK DOWN.  Do not use on open wounds or open sores. Avoid contact with your eyes, ears, mouth and genitals (private parts). Wash Face and genitals (private parts)  with your normal soap.   Wash thoroughly, paying special  attention to the area where your surgery will be performed.  Thoroughly rinse your body with warm water from the neck down.  DO NOT shower/wash with your normal soap after using and rinsing off the CHG Soap.  Pat yourself dry with a CLEAN TOWEL.  Wear CLEAN PAJAMAS to bed the night before surgery  Place CLEAN SHEETS on your bed the night before your surgery  DO NOT SLEEP WITH PETS.   Day of Surgery: Take a shower with CHG soap. Do not wear jewelry or makeup Do not wear lotions, powders, perfumes/colognes, or deodorant. Do not shave 48 hours prior to surgery.  Men may shave face and neck. Do not bring valuables to the hospital.  [facility] is not responsible for any belongings or valuables. Do not wear nail polish, gel polish, artificial nails, or any other type of covering on natural nails (fingers and toes) If you have artificial nails or gel coating that need to be removed by a nail salon, please have this removed prior to surgery. Artificial nails or gel coating may interfere with anesthesia's ability to adequately monitor your vital signs.  Wear Clean/Comfortable clothing the morning of surgery Remember to brush your teeth WITH YOUR REGULAR TOOTHPASTE.   Please read over the following fact sheets that you were given.    If you received a COVID test during your pre-op visit  it is requested that you wear a mask when out in public, stay away from anyone that may not be feeling well and notify your surgeon if you develop symptoms. If you have been in contact with anyone that has tested positive in the last 10 days please notify you surgeon.

## 2022-02-17 ENCOUNTER — Other Ambulatory Visit: Payer: Self-pay

## 2022-02-17 ENCOUNTER — Encounter (HOSPITAL_COMMUNITY)
Admission: RE | Admit: 2022-02-17 | Discharge: 2022-02-17 | Disposition: A | Discharge status: Home / Self Care | Payer: Managed Care, Other (non HMO) | Source: Ambulatory Visit | Attending: Neurosurgery | Admitting: Neurosurgery

## 2022-02-17 ENCOUNTER — Encounter (HOSPITAL_COMMUNITY): Payer: Self-pay

## 2022-02-17 VITALS — BP 108/70 | HR 96 | Temp 97.4°F | Resp 18 | Ht 68.0 in | Wt 200.0 lb

## 2022-02-17 DIAGNOSIS — Z01812 Encounter for preprocedural laboratory examination: Secondary | ICD-10-CM | POA: Diagnosis present

## 2022-02-17 DIAGNOSIS — I2510 Atherosclerotic heart disease of native coronary artery without angina pectoris: Secondary | ICD-10-CM | POA: Insufficient documentation

## 2022-02-17 DIAGNOSIS — Z01818 Encounter for other preprocedural examination: Secondary | ICD-10-CM

## 2022-02-17 HISTORY — DX: Cardiac arrhythmia, unspecified: I49.9

## 2022-02-17 LAB — CBC
HCT: 28.5 % — ABNORMAL LOW (ref 39.0–52.0)
Hemoglobin: 9 g/dL — ABNORMAL LOW (ref 13.0–17.0)
MCH: 34.5 pg — ABNORMAL HIGH (ref 26.0–34.0)
MCHC: 31.6 g/dL (ref 30.0–36.0)
MCV: 109.2 fL — ABNORMAL HIGH (ref 80.0–100.0)
Platelets: 94 10*3/uL — ABNORMAL LOW (ref 150–400)
RBC: 2.61 MIL/uL — ABNORMAL LOW (ref 4.22–5.81)
RDW: 14.6 % (ref 11.5–15.5)
WBC: 5.5 10*3/uL (ref 4.0–10.5)
nRBC: 0 % (ref 0.0–0.2)

## 2022-02-17 LAB — BASIC METABOLIC PANEL
Anion gap: 14 (ref 5–15)
BUN: 74 mg/dL — ABNORMAL HIGH (ref 6–20)
CO2: 27 mmol/L (ref 22–32)
Calcium: 8.9 mg/dL (ref 8.9–10.3)
Chloride: 98 mmol/L (ref 98–111)
Creatinine, Ser: 13.4 mg/dL — ABNORMAL HIGH (ref 0.61–1.24)
GFR, Estimated: 4 mL/min — ABNORMAL LOW (ref >60)
Glucose, Bld: 89 mg/dL (ref 70–99)
Potassium: 4.2 mmol/L (ref 3.5–5.1)
Sodium: 139 mmol/L (ref 135–145)

## 2022-02-17 LAB — TYPE AND SCREEN
ABO/RH(D): O POS
Antibody Screen: NEGATIVE

## 2022-02-17 NOTE — Progress Notes (Addendum)
PCP - Dr. Garret Reddish   Cardiologist - Dr. Lauree Chandler  PPM/ICD - Denies Device Orders - n/a Rep Notified - n/a  Chest x-ray - 10/05/2021 EKG - 01/19/2022 Stress Test - Per pt, had one done at [facility]. Result normal. ECHO - 01/19/2022 Cardiac Cath - 01/19/2022  Sleep Study - Yes. Positive OSA CPAP - Wears nightly. He believes his pressure setting is 9  No DM  Last dose of GLP1 agonist- n/a GLP1 instructions: n/a  Blood Thinner Instructions: n/a Aspirin Instructions: n/a  ERAS Protcol - Clear liquids until 0430 morning of surgery PRE-SURGERY Ensure or G2- n/a. None ordered  COVID TEST- n/a   Anesthesia review: Yes. Abnormal CBC result. Pt is also waiting kidney transplant on transplant list. He does home peritoneal dialysis nightly.  Patient denies shortness of breath, fever, cough and chest pain at PAT appointment   All instructions explained to the patient, with a verbal understanding of the material. Patient agrees to go over the instructions while at home for a better understanding. Patient also instructed to self quarantine after being tested for COVID-19. The opportunity to ask questions was provided.

## 2022-02-18 NOTE — Progress Notes (Signed)
Anesthesia Chart Review:  Case: [id number] Date/Time: 02/25/22 0715   Procedure: Cranioplasty with custom plate - RM 18   Anesthesia type: General   Pre-op diagnosis: Subdural hematoma, Status post craniectomy   Location: [place] [facility] ROOM 19 / [place] [facility]   Surgeons: Ashok Pall, MD       DISCUSSION: Patient is a 61 year old male scheduled for the above procedure.  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    Last cardiology visit was on 01/19/22 with TAngelena Form PA-C.  No chest pain, SOB, LE edema, orthopnea, or syncope.  No palpitations.  He was walking daily for 20 minutes.  He had previously not been on b-blocker due to bradycardia. He was no longer on ASA or Eliquis due to recurrent SDH requiring craniotomy. At that time he was not interested in evaluation for Watchman device. He was hoping to proceed with renal transplant at some point, and she notes, "He is cleared from a cardiac standpoint to proceed with this." Follow-up around 07/2022 planned.   Preoperative labs noted. BUN 74, Creatinine 13.40, glucose 89, H/H 9.0/28.5, PLT 94K. His previously H/H were 8.1/24.2 and PLT 127 on 10/13/21. Will forward labs to Dr. CChristella Noafor review. A T&S has been ordered for the day of surgery. He will also need an ISTAT on arrival. He does PD nightly.    VS: BP 108/70   Pulse 96   Temp (!) 36.3 C (Oral)   Resp 18   Ht '5\' 8"'$  (1.727 m)   Wt 90.7 kg    SpO2 99%   BMI 30.41 kg/m    PROVIDERS: HMarin Olp MD is PCP  MLauree Chandler MD is structural heart cardiologist SChesley Mires MD is pulmonologist ([facility]) SPearson Grippe MD is nephrolog[facility]   LABS: See DISCUSSION. (all labs ordered are listed, but only abnormal results are displayed)  Labs Reviewed  CBC - Abnormal; Notable for the following components:      Result Value   RBC 2.61 (*)    Hemoglobin 9.0 (*)    HCT 28.5 (*)    MCV 109.2 (*)    MCH 34.5 (*)    Platelets 94 (*)    All other components within normal limits  BASIC METABOLIC PANEL - Abnormal; Notable for the following components:   BUN 74 (*)    Creatinine, Ser 13.40 (*)    GFR, Estimated 4 (*)    All other components within normal limits  TYPE AND SCREEN    IMAGES: CT Head 01/28/22: FINDINGS: - Brain: Left frontal parietal craniectomy changes are again noted. There is no evidence for subdural hematoma. There is no acute intracranial hemorrhage, hydrocephalus, mass effect or midline shift. Gray-white matter distinction is preserved. - Vascular: No hyperdense vessel or unexpected calcification. - Skull: No acute fracture. - Sinuses/Orbits: No acute finding. - Other: None. IMPRESSION: 1. No acute intracranial process.  1V CXR 10/05/21: FINDINGS: Endotracheal tube is seen 6.6 cm above the carina. Nasogastric tube tip  is within the expected gastric fundus. Lungs are clear. No pneumothorax or pleural effusion. 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 Cardiac size is within normal limits. Pulmonary vascularity is normal. No acute bone abnormality. IMPRESSION: 1. Support tubes in appropriate position. 2. No active disease.   EKG: 01/19/22: Afib at 88 bpm   CV: Echo 01/19/22: IMPRESSIONS   1. Left ventricular ejection fraction, by estimation, is 60 to 65%. The  left ventricle has  normal function. The left ventricle has no regional  wall motion abnormalities. There is mild left ventricular hypertrophy.  Left ventricular diastolic function  could not be evaluated.   2. Right ventricular systolic function is normal. The right ventricular  size is normal.   3. Left atrial size was moderately dilated.   4. The mitral valve is abnormal. Mild mitral valve regurgitation.   5. The aortic valve has been repaired/replaced. Aortic valve  regurgitation is not visualized. There is a 26 mm Sapien prosthetic (TAVR)  valve present in the aortic position. Echo findings are consistent with  normal structure and function of the aortic  valve prosthesis. Aortic valve area, by VTI measures 2.56 cm. Aortic  valve mean gradient measures 10.0 mmHg. Aortic valve Vmax measures 2.32  m/s. Peak gradient 21.5 mmHg, DI 0.48. No perivalvular leak.   6. Aortic dilatation noted. There is mild dilatation of the ascending  aorta, measuring 43 mm.  - Comparison(s): Changes from prior study are noted. 02/18/2021: LVEF  60-65%, TAVR mean gradient 18 mmHG, DI 0.45, trivial PVL, ascending aorta  41 mm.    Stress Echo 06/08/21 ([facility]): STRESS ECHO  Normal left ventricular function and global wall motion with stress.  There was normal increase in global LV function post exercise.  Negative exercise echocardiography for inducible ischemia at target  heart rate.    US Carotid 06/08/21 ([facility]): Not viewable in Care Everywhere. Last available results are from 01/28/16: 1 to 39% bilateral ICA stenosis, normal subclavian arteries bilaterally, patent bilateral vertebral arteries with antegrade flow, with abnormal waveforms in the right vertebral artery.   RHC/LHC 12/04/20:   Prox LAD to Mid LAD lesion is 50% stenosed.   Mid LAD lesion is 50% stenosed.   Dist LAD-1 lesion is 20% stenosed.   Dist LAD-2 lesion is 50% stenosed.   1st Mrg lesion is 30% stenosed.   Prox Cx to Mid Cx lesion is 40%  stenosed.   Dist RCA lesion is 70% stenosed. Moderate diffuse LAD disease. Patent mid to distal LAD stent Mild mid Circumflex disease Large dominant RCA with moderately severe distal stenosis at the bifurcation into the PDA and posterolateral artery.    Recommendations: Will continue workup for AVR vs TAVR. Will arrange CT scans next. His distal RCA stenosis could be addressed with PCI but given his lack of angina or ischemic symptoms this would be best managed medically. Given his young age, will still need to consider surgical AVR as well. Further planning will be based on his CT scans.      Cardiac event monitor 01/28/16: Sinus rhythm with premature atrial contractions Atrial fibrillation with controlled heart rate     Past Medical History:  Diagnosis Date   Anemia    Anemia in chronic kidney disease 03/20/2020   Bicuspid aortic valve    Blood transfusion without reported diagnosis    CAD (coronary artery disease)    proximal LAD 50%, mid LAD 50-60%, mid to distal LAD 99%; mid circumflex 20%; proximal RCA 20%, distal  RCA 50%; PDA 60-70%, proximal posterior AV groove 50%, proximal posterior lateral 50%.  There was a question of possible LVOT gradient;  s/p Resolute DES to mid-distal LAD 10/2010;  echocardiogram 11/17/10: EF 55-60%, mild LVH, grade 1 diastolic dysfunction, normal aortic valve, mild MR, PASP 32.    Chicken pox    Dysrhythmia    A. Fib   Enlarged aorta ([name])    ESRD on peritoneal dialysis ([name])    Glaucoma    Gout    allopurinol '300mg'$ , colchicine prn in past, has not had flares   Headache    History of kidney stones    Hypercholesterolemia    With hypertriglyceridemia   Hypertension    Mild aortic stenosis    PAF (paroxysmal atrial fibrillation) ([place])    a. Documented on event monitor 01/2016.   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   SDH (subdural hematoma) ([place]) 08/2021   left   Sleep apnea    cpap nightly    Past Surgical History:  Procedure Laterality Date   COLONOSCOPY  2018   CORONARY STENT PLACEMENT  02/28/2010   CRANIOTOMY Left 10/05/2021   Procedure: CRANIOTOMY HEMATOMA EVACUATION SUBDURAL;  Surgeon: Ashok Pall, MD;  Location: [place];  Service: Neurosurgery;  Laterality: Left;   CRANIOTOMY Left 10/07/2021   Procedure: Craniectomy for Recurrent  Intracranial Hematoma;  Surgeon: Ashok Pall, MD;  Location: [place];  Service: Neurosurgery;  Laterality: Left;   CYSTOSCOPY/RETROGRADE/URETEROSCOPY  03/26/2012   Procedure: CYSTOSCOPY/RETROGRADE/URETEROSCOPY;  Surgeon: [name], MD;  Location[facility];  Service: Urology;  Laterality: Bilateral;  CYSTOSCOPY BILATERAL RETROGRADE PYELOGRAM AND POSSIBLE URETEROSCOPY   HERNIA REPAIR  [id number]   Umbilical Repair   INTRAOPERATIVE TRANSTHORACIC ECHOCARDIOGRAM N/A 01/19/2021   Procedure: INTRAOPERATIVE TRANSTHORACIC ECHOCARDIOGRAM;  Surgeon: Burnell Blanks, MD;  Location: [place];  Service: Open Heart Surgery;  Laterality: N/A;   IR ANGIO EXTERNAL CAROTID SEL EXT CAROTID UNI L MOD SED  09/28/2021   IR ANGIO INTRA EXTRACRAN SEL INTERNAL CAROTID UNI L MOD SED  09/28/2021   IR ANGIOGRAM FOLLOW UP STUDY  09/28/2021   IR ANGIOGRAM FOLLOW UP STUDY  09/30/2021   IR NEURO EACH ADD'L AFTER BASIC UNI LEFT (MS)  09/28/2021   IR NEURO EACH ADD'L AFTER BASIC UNI RIGHT (MS)  09/30/2021   IR TRANSCATH/EMBOLIZ  09/28/2021   RADIOLOGY WITH ANESTHESIA N/A 09/28/2021   Procedure: Middle meningeal artery embolization;  Surgeon: Consuella Lose, MD;  Location: [place];  Service: Radiology;  Laterality: N/A;   RIGHT HEART CATH AND CORONARY ANGIOGRAPHY N/A 12/04/2020   Procedure: RIGHT HEART CATH AND CORONARY ANGIOGRAPHY;  Surgeon: Burnell Blanks, MD;  Location: [place] [facility];  Service: Cardiovascular;  Laterality: N/A;   TRANSCATHETER  AORTIC VALVE REPLACEMENT, TRANSFEMORAL N/A 01/19/2021   Procedure: TRANSCATHETER AORTIC VALVE REPLACEMENT, TRANSFEMORAL USING A 26MM EDWARDS VALVE.;  Surgeon: Burnell Blanks, MD;  Location: [place];  Service: Open Heart Surgery;  Laterality: N/A;    MEDICATIONS:  allopurinol (ZYLOPRIM) 300 MG tablet   atorvastatin (LIPITOR) 40 MG tablet   AURYXIA 1 GM 210 MG(Fe) tablet   calcitRIOL (ROCALTROL) 0.25 MCG capsule   docusate sodium (COLACE) 100 MG capsule   gabapentin (NEURONTIN) 100 MG capsule   gentamicin ointment (GARAMYCIN) 0.1 %   HYDROcodone-acetaminophen (NORCO/VICODIN) 5-325 MG tablet   labetalol (NORMODYNE)  100 MG tablet   levETIRAcetam (KEPPRA) 250 MG tablet   loratadine (CLARITIN) 10 MG tablet   methocarbamol (ROBAXIN) 500 MG tablet   Methoxy PEG-Epoetin Beta (MIRCERA IJ)   NON FORMULARY   sulfamethoxazole-trimethoprim (BACTRIM) 400-80 MG tablet   torsemide (DEMADEX) 100 MG tablet   No current facility-administered medications for this encounter.    Myra Gianotti, PA-C Surgical Short Stay/Anesthesiology [facility] Phone [phone] [facility] Phone [phone] 02/18/2022 8:03 PM

## 2022-02-18 NOTE — Anesthesia Preprocedure Evaluation (Addendum)
Anesthesia Evaluation  Patient identified by MRN, date of birth, ID band Patient awake    Reviewed: Allergy & Precautions, NPO status , Patient's Chart, lab work & pertinent test results  Airway Mallampati: II  TM Distance: >3 FB Neck ROM: Full    Dental no notable dental hx. (+) Teeth Intact, Dental Advisory Given   Pulmonary sleep apnea and Continuous Positive Airway Pressure Ventilation    Pulmonary exam normal breath sounds clear to auscultation       Cardiovascular hypertension, + CAD  Normal cardiovascular exam+ dysrhythmias Atrial Fibrillation + Valvular Problems/Murmurs MR  Rhythm:Regular Rate:Normal     Neuro/Psych  Headaches    GI/Hepatic   Endo/Other    Renal/GU ESRFRenal diseaseLab Results      Component                Value               Date                      CREATININE               13.40 (H)           02/17/2022                BUN                      74 (H)              02/17/2022                 K                        4.2                 02/17/2022                Peritoneall dialysis  Lab Results      Component                Value               Date                      CREATININE               14.40 (H)           02/25/2022                BUN                      60 (H)              02/25/2022                NA                       139                 02/25/2022                K                        4.5                 02/25/2022  Musculoskeletal   Abdominal   Peds  Hematology  (+) Blood dyscrasia, anemia Lab Results      Component                Value               Date                      WBC                      5.5                 02/17/2022                HGB                      9.0 (L)             02/17/2022                HCT                      28.5 (L)            02/17/2022                MCV                      109.2 (H)           02/17/2022                 PLT                      94 (L)              02/17/2022               Anesthesia Other Findings   Reproductive/Obstetrics                             Anesthesia Physical Anesthesia Plan  ASA: 4  Anesthesia Plan: General   Post-op Pain Management:    Induction: Intravenous  PONV Risk Score and Plan: 3 and Treatment may vary due to age or medical condition, Ondansetron and Dexamethasone  Airway Management Planned: Oral ETT  Additional Equipment:   Intra-op Plan:   Post-operative Plan: Extubation in OR  Informed Consent: I have reviewed the patients History and Physical, chart, labs and discussed the procedure including the risks, benefits and alternatives for the proposed anesthesia with the patient or authorized representative who has indicated his/her understanding and acceptance.     Dental advisory given  Plan Discussed with: CRNA, Surgeon and Anesthesiologist  Anesthesia Plan Comments: (PAT note written 02/18/2022 by Myra Gianotti, PA-C.  )       Anesthesia Quick Evaluation

## 2022-02-25 ENCOUNTER — Encounter (HOSPITAL_COMMUNITY)
Admission: RE | Disposition: A | Discharge status: Home / Self Care | Payer: Self-pay | Source: Home / Self Care | Attending: Neurosurgery

## 2022-02-25 ENCOUNTER — Inpatient Hospital Stay (HOSPITAL_COMMUNITY): Payer: Managed Care, Other (non HMO)

## 2022-02-25 ENCOUNTER — Other Ambulatory Visit: Payer: Self-pay

## 2022-02-25 ENCOUNTER — Inpatient Hospital Stay (HOSPITAL_COMMUNITY): Payer: Managed Care, Other (non HMO) | Admitting: Certified Registered"

## 2022-02-25 ENCOUNTER — Inpatient Hospital Stay (HOSPITAL_COMMUNITY)
Admission: RE | Admit: 2022-02-25 | Discharge: 2022-02-27 | DRG: 579 | Disposition: A | Discharge status: Home / Self Care | Payer: Managed Care, Other (non HMO) | Attending: Neurosurgery | Admitting: Neurosurgery

## 2022-02-25 ENCOUNTER — Inpatient Hospital Stay (HOSPITAL_COMMUNITY): Payer: Managed Care, Other (non HMO) | Admitting: Vascular Surgery

## 2022-02-25 DIAGNOSIS — Z992 Dependence on renal dialysis: Secondary | ICD-10-CM

## 2022-02-25 DIAGNOSIS — D631 Anemia in chronic kidney disease: Secondary | ICD-10-CM

## 2022-02-25 DIAGNOSIS — Z79899 Other long term (current) drug therapy: Secondary | ICD-10-CM

## 2022-02-25 DIAGNOSIS — Z8619 Personal history of other infectious and parasitic diseases: Secondary | ICD-10-CM

## 2022-02-25 DIAGNOSIS — Z87442 Personal history of urinary calculi: Secondary | ICD-10-CM

## 2022-02-25 DIAGNOSIS — G4730 Sleep apnea, unspecified: Secondary | ICD-10-CM | POA: Diagnosis present

## 2022-02-25 DIAGNOSIS — E781 Pure hyperglyceridemia: Secondary | ICD-10-CM | POA: Diagnosis present

## 2022-02-25 DIAGNOSIS — S065XAA Traumatic subdural hemorrhage with loss of consciousness status unknown, initial encounter: Principal | ICD-10-CM | POA: Diagnosis present

## 2022-02-25 DIAGNOSIS — N186 End stage renal disease: Secondary | ICD-10-CM | POA: Diagnosis present

## 2022-02-25 DIAGNOSIS — Z8679 Personal history of other diseases of the circulatory system: Secondary | ICD-10-CM | POA: Diagnosis present

## 2022-02-25 DIAGNOSIS — I6200 Nontraumatic subdural hemorrhage, unspecified: Secondary | ICD-10-CM | POA: Diagnosis present

## 2022-02-25 DIAGNOSIS — E7800 Pure hypercholesterolemia, unspecified: Secondary | ICD-10-CM | POA: Diagnosis present

## 2022-02-25 DIAGNOSIS — Z8249 Family history of ischemic heart disease and other diseases of the circulatory system: Secondary | ICD-10-CM

## 2022-02-25 DIAGNOSIS — M952 Other acquired deformity of head: Principal | ICD-10-CM | POA: Diagnosis present

## 2022-02-25 DIAGNOSIS — I2510 Atherosclerotic heart disease of native coronary artery without angina pectoris: Secondary | ICD-10-CM | POA: Diagnosis not present

## 2022-02-25 DIAGNOSIS — I480 Paroxysmal atrial fibrillation: Secondary | ICD-10-CM | POA: Diagnosis present

## 2022-02-25 DIAGNOSIS — Z428 Encounter for other plastic and reconstructive surgery following medical procedure or healed injury: Principal | ICD-10-CM

## 2022-02-25 DIAGNOSIS — I120 Hypertensive chronic kidney disease with stage 5 chronic kidney disease or end stage renal disease: Secondary | ICD-10-CM | POA: Diagnosis not present

## 2022-02-25 DIAGNOSIS — Q231 Congenital insufficiency of aortic valve: Secondary | ICD-10-CM | POA: Diagnosis not present

## 2022-02-25 DIAGNOSIS — H409 Unspecified glaucoma: Secondary | ICD-10-CM | POA: Diagnosis present

## 2022-02-25 DIAGNOSIS — M109 Gout, unspecified: Secondary | ICD-10-CM | POA: Diagnosis present

## 2022-02-25 DIAGNOSIS — Z953 Presence of xenogenic heart valve: Secondary | ICD-10-CM | POA: Diagnosis not present

## 2022-02-25 DIAGNOSIS — Z841 Family history of disorders of kidney and ureter: Secondary | ICD-10-CM

## 2022-02-25 DIAGNOSIS — R569 Unspecified convulsions: Secondary | ICD-10-CM | POA: Diagnosis not present

## 2022-02-25 DIAGNOSIS — Z955 Presence of coronary angioplasty implant and graft: Secondary | ICD-10-CM | POA: Diagnosis not present

## 2022-02-25 DIAGNOSIS — R29700 NIHSS score 0: Secondary | ICD-10-CM | POA: Diagnosis present

## 2022-02-25 DIAGNOSIS — Z9889 Other specified postprocedural states: Secondary | ICD-10-CM

## 2022-02-25 HISTORY — PX: CRANIOPLASTY: SHX1407

## 2022-02-25 LAB — POCT I-STAT, CHEM 8
BUN: 60 mg/dL — ABNORMAL HIGH (ref 8–23)
Calcium, Ion: 1.15 mmol/L (ref 1.15–1.40)
Chloride: 99 mmol/L (ref 98–111)
Creatinine, Ser: 14.4 mg/dL — ABNORMAL HIGH (ref 0.61–1.24)
Glucose, Bld: 86 mg/dL (ref 70–99)
HCT: 25 % — ABNORMAL LOW (ref 39.0–52.0)
Hemoglobin: 8.5 g/dL — ABNORMAL LOW (ref 13.0–17.0)
Potassium: 4.5 mmol/L (ref 3.5–5.1)
Sodium: 139 mmol/L (ref 135–145)
TCO2: 29 mmol/L (ref 22–32)

## 2022-02-25 LAB — MRSA NEXT GEN BY PCR, NASAL: MRSA by PCR Next Gen: NOT DETECTED

## 2022-02-25 SURGERY — CRANIOPLASTY
Anesthesia: General | Site: Head

## 2022-02-25 MED ORDER — OXYCODONE HCL 5 MG/5ML PO SOLN: 5.0000 mg | Freq: Once | ORAL | Status: DC | PRN

## 2022-02-25 MED ORDER — HYDROMORPHONE HCL 1 MG/ML IJ SOLN: 0.5000 mg | INTRAMUSCULAR | Status: DC | PRN

## 2022-02-25 MED ORDER — THROMBIN 5000 UNITS EX SOLR
CUTANEOUS | Status: AC
  Filled 2022-02-25: qty 5000

## 2022-02-25 MED ORDER — PHENYLEPHRINE HCL-NACL 20-0.9 MG/250ML-% IV SOLN
INTRAVENOUS | Status: DC | PRN
  Administered 2022-02-25: 25 ug/min via INTRAVENOUS

## 2022-02-25 MED ORDER — PROMETHAZINE HCL 25 MG PO TABS: 12.5000 mg | ORAL_TABLET | ORAL | Status: DC | PRN

## 2022-02-25 MED ORDER — SODIUM CHLORIDE 0.9 % IV SOLN: INTRAVENOUS | Status: DC | PRN

## 2022-02-25 MED ORDER — DELFLEX-LC/1.5% DEXTROSE 344 MOSM/L IP SOLN: INTRAPERITONEAL | Status: DC

## 2022-02-25 MED ORDER — FENTANYL CITRATE (PF) 250 MCG/5ML IJ SOLN
INTRAMUSCULAR | Status: AC
  Filled 2022-02-25: qty 5

## 2022-02-25 MED ORDER — ONDANSETRON HCL 4 MG/2ML IJ SOLN: 4.0000 mg | INTRAMUSCULAR | Status: DC | PRN

## 2022-02-25 MED ORDER — HYDROCODONE-ACETAMINOPHEN 5-325 MG PO TABS: 1.0000 | ORAL_TABLET | ORAL | Status: DC | PRN

## 2022-02-25 MED ORDER — SUGAMMADEX SODIUM 200 MG/2ML IV SOLN
INTRAVENOUS | Status: DC | PRN
  Administered 2022-02-25: 200 mg via INTRAVENOUS

## 2022-02-25 MED ORDER — CEFAZOLIN SODIUM-DEXTROSE 2-4 GM/100ML-% IV SOLN
2.0000 g | INTRAVENOUS | Status: AC
  Administered 2022-02-25: 2 g via INTRAVENOUS
  Filled 2022-02-25: qty 100

## 2022-02-25 MED ORDER — DEXAMETHASONE SODIUM PHOSPHATE 10 MG/ML IJ SOLN
INTRAMUSCULAR | Status: AC
  Filled 2022-02-25: qty 2

## 2022-02-25 MED ORDER — CHLORHEXIDINE GLUCONATE 0.12 % MT SOLN
15.0000 mL | Freq: Once | OROMUCOSAL | Status: AC
  Administered 2022-02-25: 15 mL via OROMUCOSAL
  Filled 2022-02-25: qty 15

## 2022-02-25 MED ORDER — OXYCODONE HCL 5 MG PO TABS: 5.0000 mg | ORAL_TABLET | Freq: Once | ORAL | Status: DC | PRN

## 2022-02-25 MED ORDER — ORAL CARE MOUTH RINSE: 15.0000 mL | OROMUCOSAL | Status: DC | PRN

## 2022-02-25 MED ORDER — DEXAMETHASONE SODIUM PHOSPHATE 10 MG/ML IJ SOLN
INTRAMUSCULAR | Status: DC | PRN
  Administered 2022-02-25: 10 mg via INTRAVENOUS

## 2022-02-25 MED ORDER — FENTANYL CITRATE (PF) 250 MCG/5ML IJ SOLN
INTRAMUSCULAR | Status: DC | PRN
  Administered 2022-02-25 (×2): 25 ug via INTRAVENOUS
  Administered 2022-02-25: 50 ug via INTRAVENOUS
  Administered 2022-02-25: 100 ug via INTRAVENOUS

## 2022-02-25 MED ORDER — GENTAMICIN SULFATE 0.1 % EX CREA: 1.0000 | TOPICAL_CREAM | Freq: Every day | CUTANEOUS | Status: DC

## 2022-02-25 MED ORDER — DOCUSATE SODIUM 100 MG PO CAPS
100.0000 mg | ORAL_CAPSULE | Freq: Every day | ORAL | Status: DC
  Administered 2022-02-26 – 2022-02-27 (×2): 100 mg via ORAL
  Filled 2022-02-25 (×2): qty 1

## 2022-02-25 MED ORDER — CEFAZOLIN SODIUM-DEXTROSE 2-4 GM/100ML-% IV SOLN
2.0000 g | Freq: Three times a day (TID) | INTRAVENOUS | Status: DC
  Administered 2022-02-25: 2 g via INTRAVENOUS

## 2022-02-25 MED ORDER — LABETALOL HCL 100 MG PO TABS
100.0000 mg | ORAL_TABLET | Freq: Two times a day (BID) | ORAL | Status: DC
  Administered 2022-02-27: 100 mg via ORAL
  Filled 2022-02-25 (×3): qty 1

## 2022-02-25 MED ORDER — DEXAMETHASONE SODIUM PHOSPHATE 4 MG/ML IJ SOLN
4.0000 mg | Freq: Four times a day (QID) | INTRAMUSCULAR | Status: AC
  Administered 2022-02-26 – 2022-02-27 (×4): 4 mg via INTRAVENOUS
  Filled 2022-02-25 (×4): qty 1

## 2022-02-25 MED ORDER — THROMBIN 20000 UNITS EX SOLR
CUTANEOUS | Status: AC
  Filled 2022-02-25: qty 20000

## 2022-02-25 MED ORDER — CHLORHEXIDINE GLUCONATE CLOTH 2 % EX PADS: 6.0000 | MEDICATED_PAD | Freq: Once | CUTANEOUS | Status: DC

## 2022-02-25 MED ORDER — EPHEDRINE SULFATE-NACL 50-0.9 MG/10ML-% IV SOSY
PREFILLED_SYRINGE | INTRAVENOUS | Status: DC | PRN
  Administered 2022-02-25 (×5): 5 mg via INTRAVENOUS

## 2022-02-25 MED ORDER — LORATADINE 10 MG PO TABS
10.0000 mg | ORAL_TABLET | Freq: Every day | ORAL | Status: DC
  Administered 2022-02-26 – 2022-02-27 (×2): 10 mg via ORAL
  Filled 2022-02-25 (×2): qty 1

## 2022-02-25 MED ORDER — BACITRACIN ZINC 500 UNIT/GM EX OINT
TOPICAL_OINTMENT | CUTANEOUS | Status: AC
  Filled 2022-02-25: qty 28.35

## 2022-02-25 MED ORDER — ONDANSETRON HCL 4 MG/2ML IJ SOLN: 4.0000 mg | Freq: Once | INTRAMUSCULAR | Status: DC | PRN

## 2022-02-25 MED ORDER — CEFAZOLIN SODIUM-DEXTROSE 2-4 GM/100ML-% IV SOLN
INTRAVENOUS | Status: AC
  Filled 2022-02-25: qty 100

## 2022-02-25 MED ORDER — POTASSIUM CHLORIDE IN NACL 20-0.9 MEQ/L-% IV SOLN
INTRAVENOUS | Status: DC
  Filled 2022-02-25: qty 1000

## 2022-02-25 MED ORDER — FERRIC CITRATE 1 GM 210 MG(FE) PO TABS
630.0000 mg | ORAL_TABLET | Freq: Three times a day (TID) | ORAL | Status: DC
  Administered 2022-02-26 – 2022-02-27 (×4): 630 mg via ORAL
  Filled 2022-02-25 (×4): qty 3

## 2022-02-25 MED ORDER — NALOXONE HCL 0.4 MG/ML IJ SOLN: 0.0800 mg | INTRAMUSCULAR | Status: DC | PRN

## 2022-02-25 MED ORDER — CALCITRIOL 0.25 MCG PO CAPS
0.2500 ug | ORAL_CAPSULE | Freq: Every day | ORAL | Status: DC
  Administered 2022-02-25 – 2022-02-27 (×3): 0.25 ug via ORAL
  Filled 2022-02-25 (×3): qty 1

## 2022-02-25 MED ORDER — LABETALOL HCL 5 MG/ML IV SOLN: 10.0000 mg | INTRAVENOUS | Status: DC | PRN

## 2022-02-25 MED ORDER — ONDANSETRON HCL 4 MG PO TABS: 4.0000 mg | ORAL_TABLET | ORAL | Status: DC | PRN

## 2022-02-25 MED ORDER — TORSEMIDE 100 MG PO TABS
100.0000 mg | ORAL_TABLET | Freq: Every day | ORAL | Status: DC
  Administered 2022-02-26 – 2022-02-27 (×2): 100 mg via ORAL
  Filled 2022-02-25 (×2): qty 1

## 2022-02-25 MED ORDER — ROCURONIUM BROMIDE 10 MG/ML (PF) SYRINGE
PREFILLED_SYRINGE | INTRAVENOUS | Status: DC | PRN
  Administered 2022-02-25: 20 mg via INTRAVENOUS
  Administered 2022-02-25: 10 mg via INTRAVENOUS
  Administered 2022-02-25: 50 mg via INTRAVENOUS

## 2022-02-25 MED ORDER — ACETAMINOPHEN 325 MG PO TABS
650.0000 mg | ORAL_TABLET | ORAL | Status: DC | PRN
  Administered 2022-02-25: 650 mg via ORAL
  Filled 2022-02-25: qty 2

## 2022-02-25 MED ORDER — GENTAMICIN SULFATE 0.1 % EX OINT
1.0000 | TOPICAL_OINTMENT | Freq: Every day | CUTANEOUS | Status: DC
  Administered 2022-02-26: 1 via TOPICAL
  Filled 2022-02-25: qty 15

## 2022-02-25 MED ORDER — DEXAMETHASONE 4 MG PO TABS
4.0000 mg | ORAL_TABLET | Freq: Three times a day (TID) | ORAL | Status: DC
  Filled 2022-02-25: qty 1

## 2022-02-25 MED ORDER — ORAL CARE MOUTH RINSE: 15.0000 mL | Freq: Once | OROMUCOSAL | Status: AC

## 2022-02-25 MED ORDER — DEXAMETHASONE 0.5 MG PO TABS
2.0000 mg | ORAL_TABLET | Freq: Four times a day (QID) | ORAL | Status: DC
  Administered 2022-02-25 (×2): 2 mg via ORAL
  Filled 2022-02-25: qty 4
  Filled 2022-02-25: qty 1

## 2022-02-25 MED ORDER — THROMBIN 5000 UNITS EX SOLR
OROMUCOSAL | Status: DC | PRN
  Administered 2022-02-25: 5 mL via TOPICAL

## 2022-02-25 MED ORDER — 0.9 % SODIUM CHLORIDE (POUR BTL) OPTIME
TOPICAL | Status: DC | PRN
  Administered 2022-02-25: 2000 mL

## 2022-02-25 MED ORDER — LIDOCAINE-EPINEPHRINE 0.5 %-1:200000 IJ SOLN
INTRAMUSCULAR | Status: DC | PRN
  Administered 2022-02-25: 12 mL

## 2022-02-25 MED ORDER — THROMBIN 20000 UNITS EX SOLR
CUTANEOUS | Status: DC | PRN
  Administered 2022-02-25: 20 mL via TOPICAL

## 2022-02-25 MED ORDER — ONDANSETRON HCL 4 MG/2ML IJ SOLN
INTRAMUSCULAR | Status: AC
  Filled 2022-02-25: qty 4

## 2022-02-25 MED ORDER — LIDOCAINE 2% (20 MG/ML) 5 ML SYRINGE
INTRAMUSCULAR | Status: AC
  Filled 2022-02-25: qty 15

## 2022-02-25 MED ORDER — LIDOCAINE 2% (20 MG/ML) 5 ML SYRINGE
INTRAMUSCULAR | Status: DC | PRN
  Administered 2022-02-25: 100 mg via INTRAVENOUS

## 2022-02-25 MED ORDER — HYDROMORPHONE HCL 1 MG/ML IJ SOLN: 0.2500 mg | INTRAMUSCULAR | Status: DC | PRN

## 2022-02-25 MED ORDER — ROCURONIUM BROMIDE 10 MG/ML (PF) SYRINGE
PREFILLED_SYRINGE | INTRAVENOUS | Status: AC
  Filled 2022-02-25: qty 10

## 2022-02-25 MED ORDER — DEXAMETHASONE SODIUM PHOSPHATE 4 MG/ML IJ SOLN: 4.0000 mg | Freq: Three times a day (TID) | INTRAMUSCULAR | Status: DC

## 2022-02-25 MED ORDER — MORPHINE SULFATE (PF) 2 MG/ML IV SOLN: 1.0000 mg | INTRAVENOUS | Status: DC | PRN

## 2022-02-25 MED ORDER — ALLOPURINOL 300 MG PO TABS
300.0000 mg | ORAL_TABLET | Freq: Every day | ORAL | Status: DC
  Administered 2022-02-26 – 2022-02-27 (×2): 300 mg via ORAL
  Filled 2022-02-25 (×2): qty 1

## 2022-02-25 MED ORDER — LACTATED RINGERS IV SOLN: INTRAVENOUS | Status: DC

## 2022-02-25 MED ORDER — PROPOFOL 10 MG/ML IV BOLUS
INTRAVENOUS | Status: AC
  Filled 2022-02-25: qty 20

## 2022-02-25 MED ORDER — ACETAMINOPHEN 10 MG/ML IV SOLN
INTRAVENOUS | Status: AC
  Filled 2022-02-25: qty 100

## 2022-02-25 MED ORDER — DEXAMETHASONE 4 MG PO TABS
4.0000 mg | ORAL_TABLET | Freq: Four times a day (QID) | ORAL | Status: DC
  Filled 2022-02-25: qty 1

## 2022-02-25 MED ORDER — CHLORHEXIDINE GLUCONATE CLOTH 2 % EX PADS
6.0000 | MEDICATED_PAD | Freq: Every day | CUTANEOUS | Status: DC
  Administered 2022-02-25: 6 via TOPICAL

## 2022-02-25 MED ORDER — GABAPENTIN 100 MG PO CAPS: 100.0000 mg | ORAL_CAPSULE | Freq: Every evening | ORAL | Status: DC | PRN

## 2022-02-25 MED ORDER — ONDANSETRON HCL 4 MG/2ML IJ SOLN
INTRAMUSCULAR | Status: DC | PRN
  Administered 2022-02-25: 4 mg via INTRAVENOUS

## 2022-02-25 MED ORDER — ACETAMINOPHEN 10 MG/ML IV SOLN
1000.0000 mg | Freq: Once | INTRAVENOUS | Status: DC | PRN
  Administered 2022-02-25: 1000 mg via INTRAVENOUS

## 2022-02-25 MED ORDER — ATORVASTATIN CALCIUM 40 MG PO TABS
40.0000 mg | ORAL_TABLET | Freq: Every day | ORAL | Status: DC
  Administered 2022-02-26 – 2022-02-27 (×2): 40 mg via ORAL
  Filled 2022-02-25 (×2): qty 1

## 2022-02-25 MED ORDER — LEVETIRACETAM IN NACL 500 MG/100ML IV SOLN
500.0000 mg | Freq: Two times a day (BID) | INTRAVENOUS | Status: DC
  Administered 2022-02-25 – 2022-02-26 (×3): 500 mg via INTRAVENOUS
  Filled 2022-02-25 (×3): qty 100

## 2022-02-25 MED ORDER — BACITRACIN ZINC 500 UNIT/GM EX OINT
TOPICAL_OINTMENT | CUTANEOUS | Status: DC | PRN
  Administered 2022-02-25: 1 via TOPICAL

## 2022-02-25 MED ORDER — AMISULPRIDE (ANTIEMETIC) 5 MG/2ML IV SOLN: 10.0000 mg | Freq: Once | INTRAVENOUS | Status: DC | PRN

## 2022-02-25 MED ORDER — PROPOFOL 10 MG/ML IV BOLUS
INTRAVENOUS | Status: DC | PRN
  Administered 2022-02-25: 130 mg via INTRAVENOUS

## 2022-02-25 MED ORDER — DEXAMETHASONE SODIUM PHOSPHATE 4 MG/ML IJ SOLN
2.0000 mg | Freq: Four times a day (QID) | INTRAMUSCULAR | Status: AC
  Administered 2022-02-26 (×2): 2 mg via INTRAVENOUS
  Filled 2022-02-25: qty 1

## 2022-02-25 MED ORDER — ACETAMINOPHEN 650 MG RE SUPP: 650.0000 mg | RECTAL | Status: DC | PRN

## 2022-02-25 MED ORDER — LORAZEPAM 2 MG/ML IJ SOLN
4.0000 mg | INTRAMUSCULAR | Status: DC | PRN
  Administered 2022-02-25: 4 mg via INTRAVENOUS
  Filled 2022-02-25: qty 2

## 2022-02-25 MED ORDER — LIDOCAINE-EPINEPHRINE 0.5 %-1:200000 IJ SOLN
INTRAMUSCULAR | Status: AC
  Filled 2022-02-25: qty 1

## 2022-02-25 SURGICAL SUPPLY — 57 items
BAG COUNTER SPONGE SURGICOUNT (BAG) ×1 IMPLANT
BLADE CLIPPER SURG (BLADE) IMPLANT
BNDG ELASTIC 3X5.8 VLCR STR LF (GAUZE/BANDAGES/DRESSINGS) IMPLANT
BNDG STRETCH 4X75 STRL LF (GAUZE/BANDAGES/DRESSINGS) IMPLANT
CANISTER SUCT 3000ML PPV (MISCELLANEOUS) ×1 IMPLANT
CLIP RANEY DISP (INSTRUMENTS) IMPLANT
COVER BACK TABLE 60X90IN (DRAPES) IMPLANT
DERMABOND ADVANCED .7 DNX12 (GAUZE/BANDAGES/DRESSINGS) IMPLANT
DRAPE NEUROLOGICAL W/INCISE (DRAPES) ×1 IMPLANT
DRAPE WARM FLUID 44X44 (DRAPES) ×1 IMPLANT
DRSG TELFA 3X8 NADH STRL (GAUZE/BANDAGES/DRESSINGS) IMPLANT
DURAGUARD 06CMX08CM IMPLANT
DURAPREP 6ML APPLICATOR 50/CS (WOUND CARE) ×1 IMPLANT
ELECT REM PT RETURN 9FT ADLT (ELECTROSURGICAL) ×1
ELECTRODE REM PT RTRN 9FT ADLT (ELECTROSURGICAL) ×1 IMPLANT
GAUZE 4X4 16PLY ~~LOC~~+RFID DBL (SPONGE) IMPLANT
GAUZE SPONGE 4X4 12PLY STRL (GAUZE/BANDAGES/DRESSINGS) ×1 IMPLANT
GAUZE SPONGE 4X4 16PLY XRAY LF (GAUZE/BANDAGES/DRESSINGS) IMPLANT
GLOVE ECLIPSE 6.5 STRL STRAW (GLOVE) ×1 IMPLANT
GLOVE EXAM NITRILE XL STR (GLOVE) IMPLANT
GOWN STRL REUS W/ TWL LRG LVL3 (GOWN DISPOSABLE) ×2 IMPLANT
GOWN STRL REUS W/ TWL XL LVL3 (GOWN DISPOSABLE) IMPLANT
GOWN STRL REUS W/TWL 2XL LVL3 (GOWN DISPOSABLE) IMPLANT
GOWN STRL REUS W/TWL LRG LVL3 (GOWN DISPOSABLE) ×1
GOWN STRL REUS W/TWL XL LVL3 (GOWN DISPOSABLE)
HEMOSTAT POWDER KIT SURGIFOAM (HEMOSTASIS) IMPLANT
HEMOSTAT SURGICEL 2X14 (HEMOSTASIS) ×1 IMPLANT
HOOK DURA 1/2IN (MISCELLANEOUS) IMPLANT
KIT BASIN OR (CUSTOM PROCEDURE TRAY) ×1 IMPLANT
KIT TURNOVER KIT B (KITS) ×1 IMPLANT
NDL HYPO 25X1 1.5 SAFETY (NEEDLE) ×1 IMPLANT
NEEDLE HYPO 25X1 1.5 SAFETY (NEEDLE) ×1 IMPLANT
NS IRRIG 1000ML POUR BTL (IV SOLUTION) ×1 IMPLANT
PACK BATTERY CMF DISP FOR DVR (ORTHOPEDIC DISPOSABLE SUPPLIES) IMPLANT
PACK CRANIOTOMY CUSTOM (CUSTOM PROCEDURE TRAY) ×1 IMPLANT
PAD ARMBOARD 7.5X6 YLW CONV (MISCELLANEOUS) ×3 IMPLANT
PEEK CRANIOPLASTY LARGE (Neuro Prosthesis/Implant) IMPLANT
PIN MAYFIELD SKULL DISP (PIN) IMPLANT
PLATE CRANIAL CMF UNIV (Plate) IMPLANT
PLATE CRANIAL HX4 UNI NEURO 3 (Plate) IMPLANT
PLATE UNIV CMF LG 4H (Plate) IMPLANT
SCREW UNIII AXS SD 1.5X4 (Screw) IMPLANT
SCREW UNIII AXS SD 1.5X5 (Screw) IMPLANT
SET CRAINOPLASTY (SET/KITS/TRAYS/PACK) ×1 IMPLANT
SPIKE FLUID TRANSFER (MISCELLANEOUS) ×1 IMPLANT
SPONGE SURGIFOAM ABS GEL 100 (HEMOSTASIS) IMPLANT
STAPLER SKIN PROX WIDE 3.9 (STAPLE) ×1 IMPLANT
STOCKINETTE 6  STRL (DRAPES) ×1
STOCKINETTE 6 STRL (DRAPES) IMPLANT
SUT ETHILON 3 0 FSL (SUTURE) IMPLANT
SUT NURALON 4 0 TR CR/8 (SUTURE) IMPLANT
SUT VIC AB 2-0 CT2 18 VCP726D (SUTURE) ×1 IMPLANT
TOWEL GREEN STERILE (TOWEL DISPOSABLE) ×1 IMPLANT
TOWEL GREEN STERILE FF (TOWEL DISPOSABLE) ×1 IMPLANT
TRAY FOLEY MTR SLVR 16FR STAT (SET/KITS/TRAYS/PACK) IMPLANT
UNDERPAD 30X36 HEAVY ABSORB (UNDERPADS AND DIAPERS) IMPLANT
WATER STERILE IRR 1000ML POUR (IV SOLUTION) ×1 IMPLANT

## 2022-02-25 NOTE — Progress Notes (Signed)
Patient experienced two more seizures at 2250 lasting approximately 3 minutes and at 2345 lasting 30-40 seconds. Facial twitching and arm jerking were noted. Post seizure R arm weakness and some aphasia were noted initially and subsided.   Neurosurgery called, Meyran MD put in orders for IV ativan '4mg'$  PRN and '500mg'$  keppra IVPB BID

## 2022-02-25 NOTE — Anesthesia Procedure Notes (Signed)
Procedure Name: Intubation Date/Time: 02/25/2022 8:46 AM  Performed by: Gaylene Brooks, CRNAPre-anesthesia Checklist: Patient identified, Emergency Drugs available, Suction available and Patient being monitored Patient Re-evaluated:Patient Re-evaluated prior to induction Oxygen Delivery Method: Circle System Utilized Preoxygenation: Pre-oxygenation with 100% oxygen Induction Type: IV induction Ventilation: Mask ventilation without difficulty and Oral airway inserted - appropriate to patient size Laryngoscope Size: Sabra Heck and 2 Grade View: Grade II Tube type: Oral Tube size: 7.5 mm Number of attempts: 1 Airway Equipment and Method: Stylet and Oral airway Placement Confirmation: ETT inserted through vocal cords under direct vision, positive ETCO2 and breath sounds checked- equal and bilateral Secured at: 23 cm Tube secured with: Tape Dental Injury: Teeth and Oropharynx as per pre-operative assessment

## 2022-02-25 NOTE — Anesthesia Postprocedure Evaluation (Signed)
Anesthesia Post Note  Patient: Daniel Solomon  Procedure(s) Performed: Cranioplasty with custom plate (Head)     Patient location during evaluation: PACU Anesthesia Type: General Level of consciousness: awake and alert Pain management: pain level controlled Vital Signs Assessment: post-procedure vital signs reviewed and stable Respiratory status: spontaneous breathing, nonlabored ventilation, respiratory function stable and patient connected to nasal cannula oxygen Cardiovascular status: blood pressure returned to baseline and stable Postop Assessment: no apparent nausea or vomiting Anesthetic complications: no  No notable events documented.  Last Vitals:  Vitals:   02/25/22 1215 02/25/22 1234  BP: 95/64 122/84  Pulse: 85 90  Resp: 11 17  Temp:    SpO2: 93% 94%    Last Pain:  Vitals:   02/25/22 1130  TempSrc:   PainSc: 3     LLE Motor Response: Purposeful movement (02/25/22 1234) LLE Sensation: Full sensation (02/25/22 1234) RLE Motor Response: Purposeful movement (02/25/22 1234) RLE Sensation: Full sensation (02/25/22 1234)      Barnet Glasgow

## 2022-02-25 NOTE — Transfer of Care (Signed)
Immediate Anesthesia Transfer of Care Note  Patient: Daniel Solomon  Procedure(s) Performed: Cranioplasty with custom plate (Head)  Patient Location: PACU  Anesthesia Type:General  Level of Consciousness: awake, drowsy, and patient cooperative  Airway & Oxygen Therapy: Patient Spontanous Breathing and Patient connected to nasal cannula oxygen  Post-op Assessment: Report given to RN, Post -op Vital signs reviewed and stable, and Patient moving all extremities X 4  Post vital signs: Reviewed and stable  Last Vitals:  Vitals Value Taken Time  BP 97/66 02/25/22 1105  Temp    Pulse 96 02/25/22 1107  Resp 18 02/25/22 1107  SpO2 97 % 02/25/22 1107  Vitals shown include unvalidated device data.  Last Pain:  Vitals:   02/25/22 0613  TempSrc:   PainSc: 0-No pain         Complications: No notable events documented.

## 2022-02-25 NOTE — Progress Notes (Signed)
1930 patient experienced brief, 30 second seizure-like activity, patient was in chair and experienced tremors to face, mouth, and arms. Patient had brief inability to speak following seizure-like activity. After a few minutes he was able to state his name, where he was and reason for being in the hospital. Minor dysarthria and slight facial droop were noted. Patient was taken for STAT head CT and Neurosurgery notified.

## 2022-02-25 NOTE — Progress Notes (Addendum)
Patient ID: Daniel Solomon, male   DOB: [birth year], 61 y.o.   MRN: [id number] BP 98/71   Pulse (!) 104   Temp 98 F (36.7 C)   Resp 14   Ht '5\' 8"'$  (1.727 m)   Wt 88.5 kg   SpO2 97%   BMI 29.65 kg/m  CT scan reviewed. Small subdural on left side. Basal cisterns widely patent. No midline shift. Facial droop, and speech probably due to seizure versus mass effect.  Spoke to Nephrology after paging twice, no response initially to call at 1906. Appreciate orders for the dialysis.  Speech is now clear, moving all extremities

## 2022-02-25 NOTE — Progress Notes (Addendum)
Received message from pharmacy in regards to consultation. Patient presented for elective procedure this AM (cranioplasty) and we are now just being consulted. Pharmacy was asked to place PD orders by a nsgy PA which is inappropriate. Of note, neurosurgery still has not directly contacted [place].  Outpatient orders Unit: [facility] EDW 84.5kg Exchanges: 5 Fill vol: 3L Last Fill 2500 Therapy time: 10hrs 4mn Dwell: 1.5 hrs +Daytime dwell x 1 with fill vol 2500 (manual)  Patient known to our group. Will place CCPD orders for today without daytime dwell. Vitals reviewed, will utilize all 1.5% bags. Will hold off on day dwell while he's here, can resume that as an outpatient. Full consult to follow in AM. Please call with any questions/concerns.  Update: received message from RN that patient has NS running (80cc/hr) with 23m kcl in bag. This patient has end stage kidney disease. Unless needed for some specific neurosurgical indication this is also inappropriate. RN will follow up with primary service but I have recommended that this be stopped.  ViGean QuintMD [facility] Phone: [phone]

## 2022-02-25 NOTE — H&P (Signed)
BP 101/83   Pulse 88   Temp 98.3 F (36.8 C) (Oral)   Resp 18   Ht '5\' 8"'$  (1.727 m)   Wt 88.5 kg   SpO2 99%   BMI 29.65 kg/m  Daniel Solomon is a 61 y.o. male who presents today for a cranioplasty status post craniectomy for subdural hematoma removal. Daniel Solomon has esrd, and currently is doing well. This is an elective procedure.  No Known Allergies Past Medical History:  Diagnosis Date   Anemia    Anemia in chronic kidney disease 03/20/2020   Bicuspid aortic valve    Blood transfusion without reported diagnosis    CAD (coronary artery disease)    proximal LAD 50%, mid LAD 50-60%, mid to distal LAD 99%; mid circumflex 20%; proximal RCA 20%, distal RCA 50%; PDA 60-70%, proximal posterior AV groove 50%, proximal posterior lateral 50%.  There was a question of possible LVOT gradient;  s/p Resolute DES to mid-distal LAD 10/2010;  echocardiogram 11/17/10: EF 55-60%, mild LVH, grade 1 diastolic dysfunction, normal aortic valve, mild MR, PASP 32.    Chicken pox    Dysrhythmia    A. Fib   Enlarged aorta ([name])    ESRD on peritoneal dialysis ([name])    Glaucoma    Gout    allopurinol '300mg'$ , colchicine prn in past, has not had flares   Headache    History of kidney stones    Hypercholesterolemia    With hypertriglyceridemia   Hypertension    Mild aortic stenosis    PAF (paroxysmal atrial fibrillation) ([place])    a. Documented on event monitor 01/2016.   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   SDH (subdural hematoma) ([place]) 08/2021   left   Sleep apnea    cpap nightly   Past Surgical History:  Procedure Laterality Date   COLONOSCOPY  2018   CORONARY STENT PLACEMENT  02/28/2010   CRANIOTOMY Left 10/05/2021   Procedure: CRANIOTOMY HEMATOMA EVACUATION SUBDURAL;  Surgeon: Ashok Pall, MD;  Location: [facility];  Service: Neurosurgery;  Laterality: Left;    CRANIOTOMY Left 10/07/2021   Procedure: Craniectomy for Recurrent  Intracranial Hematoma;  Surgeon: Ashok Pall, MD;  Location: [facility];  Service: Neurosurgery;  Laterality: Left;   CYSTOSCOPY/RETROGRADE/URETEROSCOPY  03/26/2012   Procedure: CYSTOSCOPY/RETROGRADE/URETEROSCOPY;  Surgeon: [name], MD;  Location: [facility];  Service: Urology;  Laterality: Bilateral;  CYSTOSCOPY BILATERAL RETROGRADE PYELOGRAM AND POSSIBLE URETEROSCOPY   HERNIA REPAIR  7341   Umbilical Repair   INTRAOPERATIVE TRANSTHORACIC ECHOCARDIOGRAM N/A 01/19/2021   Procedure: INTRAOPERATIVE TRANSTHORACIC ECHOCARDIOGRAM;  Surgeon: Burnell Blanks, MD;  Location: [facility] [place];  Service: Open Heart Surgery;  Laterality: N/A;   IR ANGIO EXTERNAL CAROTID SEL EXT CAROTID UNI L MOD SED  09/28/2021   IR ANGIO INTRA EXTRACRAN SEL INTERNAL CAROTID UNI L MOD SED  09/28/2021   IR ANGIOGRAM FOLLOW UP STUDY  09/28/2021   IR ANGIOGRAM FOLLOW UP STUDY  09/30/2021   IR NEURO EACH ADD'L AFTER BASIC UNI LEFT (MS)  09/28/2021   IR NEURO EACH ADD'L AFTER BASIC UNI RIGHT (MS)  09/30/2021   IR TRANSCATH/EMBOLIZ  09/28/2021   RADIOLOGY WITH ANESTHESIA N/A 09/28/2021   Procedure: Middle meningeal artery embolization;  Surgeon: Consuella Lose, MD;  Location: [place];  Service: Radiology;  Laterality: N/A;   RIGHT HEART CATH AND  CORONARY ANGIOGRAPHY N/A 12/04/2020   Procedure: RIGHT HEART CATH AND CORONARY ANGIOGRAPHY;  Surgeon: Burnell Blanks, MD;  Location: [facility];  Service: Cardiovascular;  Laterality: N/A;   TRANSCATHETER AORTIC VALVE REPLACEMENT, TRANSFEMORAL N/A 01/19/2021   Procedure: TRANSCATHETER AORTIC VALVE REPLACEMENT, TRANSFEMORAL USING A 26MM EDWARDS VALVE.;  Surgeon: Burnell Blanks, MD;  Location: [facility];  Service: Open Heart Surgery;  Laterality: N/A;   Family History  Problem Relation Age of Onset   Hypertension Father    CAD Father        CABG 103s   Kidney disease Father         Stage IV   Valvular heart disease Mother        AVR July 2015   Diabetes Paternal Grandmother    Colon cancer Neg Hx    Social History   Socioeconomic History   Marital status: Married    Spouse name: Not on file   Number of children: 4   Years of education: Not on file   Highest education level: Not on file  Occupational History   Occupation: Lawyer: [employer]  Tobacco Use   Smoking status: Never   Smokeless tobacco: Never  Vaping Use   Vaping Use: Never used  Substance and Sexual Activity   Alcohol use: No   Drug use: No   Sexual activity: Yes  Other Topics Concern   Not on file  Social History Narrative   Family: Married, 4 children, 9 grandkids      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 Authorizing Provider  allopurinol (ZYLOPRIM) 300 MG tablet TAKE 1 TABLET BY MOUTH EVERY DAY 01/04/22  Yes Marin Olp, MD  atorvastatin (LIPITOR) 40 MG tablet TAKE 1 TABLET BY MOUTH EVERY DAY 01/04/22  Yes Marin Olp, MD  AURYXIA 1 GM 210 MG(Fe) tablet Take 630 mg by mouth 3 (three) times daily with meals. 12/23/20  Yes [provider]  calcitRIOL (ROCALTROL) 0.25 MCG capsule Take 0.25 mcg by mouth daily. 01/29/21  Yes [provider]  docusate sodium (COLACE) 100 MG capsule Take 100 mg by mouth daily.   Yes [provider]  gabapentin (NEURONTIN) 100 MG capsule Take 100 mg by mouth at bedtime as needed (pain).   Yes [provider]  gentamicin ointment (GARAMYCIN) 0.1 % Apply 1 application topically daily.   Yes [provider]  labetalol (NORMODYNE) 100 MG tablet Take 100 mg by mouth 2 (two) times daily. 04/30/20  Yes [provider]  loratadine (CLARITIN) 10 MG tablet Take 10 mg by mouth daily.   Yes [provider]  Methoxy PEG-Epoetin Beta (MIRCERA IJ) Inject into the skin as needed. 06/15/20  Yes [provider]  NON FORMULARY Pt uses a cpap nightly   Yes [provider]  torsemide (DEMADEX) 100 MG tablet Take 1 tablet (100 mg total)  by mouth daily. 08/25/21  Yes Burnell Blanks, MD  HYDROcodone-acetaminophen (NORCO/VICODIN) 5-325 MG tablet Take 1 tablet by mouth every 6 (six) hours as needed for moderate pain or severe pain. 1-2 tabs po tid prn Patient not taking: Reported on 02/15/2022 10/14/21   Eustace Moore, MD  levETIRAcetam (KEPPRA) 250 MG tablet Take 1 tablet (250 mg total) by mouth 2 (two) times daily. Patient not taking: Reported on 02/15/2022 10/14/21   Eustace Moore, MD  methocarbamol (ROBAXIN) 500 MG tablet Take 2 tablets (1,000 mg total) by mouth 4 (four) times daily as needed (for Headache). START with 1 pill, if not too drowsy take 2 pills. Patient not taking: Reported on 02/15/2022 09/09/21   Jeanie Sewer, NP   Results for orders placed or performed during the hospital encounter of 02/25/22 (from the past 72 hour(s))  I-STAT, chem 8     Status: Abnormal   Collection Time: 02/25/22  6:07 AM  Result Value Ref Range   Sodium 139 135 - 145 mmol/L   Potassium 4.5 3.5 - 5.1 mmol/L   Chloride 99 98 - 111 mmol/L   BUN 60 (H) 8 - 23 mg/dL   Creatinine, Ser 14.40 (H) 0.61 - 1.24 mg/dL   Glucose, Bld 86 70 - 99 mg/dL    Comment: Glucose reference range applies only to samples taken after fasting for at least 8 hours.   Calcium, Ion 1.15 1.15 - 1.40 mmol/L   TCO2 29 22 - 32 mmol/L   Hemoglobin 8.5 (L) 13.0 - 17.0 g/dL   HCT 25.0 (L) 39.0  - 52.0 %   Physical Exam Constitutional:      Appearance: Normal appearance.  HENT:     Head:     Comments: Cranial defect     Right Ear: External ear normal.     Left Ear: External ear normal.     Mouth/Throat:     Mouth: Mucous membranes are moist.     Pharynx: Oropharynx is clear.  Eyes:     Extraocular Movements: Extraocular movements intact.     Pupils: Pupils are equal, round, and reactive to light.  Cardiovascular:     Rate and Rhythm: Normal rate and regular rhythm.  Pulmonary:     Effort: Pulmonary effort is normal.     Breath sounds: Normal breath sounds.  Abdominal:     General: Abdomen is flat.     Palpations: Abdomen is soft.  Musculoskeletal:        General: Normal range of motion.     Cervical back: Normal range of motion and neck supple.  Skin:    General: Skin is warm and dry.  Neurological:     Mental Status: He is alert and oriented to person, place, and time.     Cranial Nerves: No cranial nerve deficit.     Sensory: No sensory deficit.     Motor: No weakness.     Coordination: Coordination normal.     Gait: Gait normal.     Deep Tendon Reflexes: Reflexes normal.  Psychiatric:        Mood and Affect: Mood normal.        Behavior: Behavior normal.        Thought Content: Thought content normal.        Judgment: Judgment normal.    OR for cranioplasty.  Risks and benefits bleeding, infection, not expected cosmetic result, brain damage, epidural hemorrhage, and other risks were discussed. He understands and agrees.

## 2022-02-26 NOTE — Consult Note (Signed)
ESRD Consult Note  Assessment/Recommendations:   ESRD on PD:  -c/w CCPD nightly. Can hold off on manual day dwell while here. Will be utilizing all 1.5% bags for tonight  H/o SDH -s/p elective cranioplasty 12/29, per nsgy  Seizure d/o -per primary service  Volume/ hypertension: -given that he appears euvolemic w/ soft BP's, will utilize all 1.5% bags  Anemia of Chronic Kidney Disease: Hemoglobin 8.5. Iron profile ordered   Secondary Hyperparathyroidism/Hyperphosphatemia: calcitriol and auryxia resumed, chk PO4   Outpatient orders: Unit: [facility] EDW 84.5kg Exchanges: 5 Fill vol: 3L Last Fill 2500 Therapy time: 10hrs 41mn Dwell: 1.5 hrs +Daytime dwell x 1 with fill vol 2500 (manual)  VGean Quint MD [facility]  History of Present Illness: Daniel NICKLESis a/an 61y.o. male with a past medical history of ESRD on PD, h/o SDH s/p evac and craniotomy in Aug '23, PAF, HTN, CAD who presents for elective procedure: cranioplasty, performed on 12/29. Had post op seizures. Did not run on PD last night. Per charting, apparently I was paged yesterday about this consultation which is odd since I do not have a pager number. Patient seen and examined in the ICU, wife at bedside. Drowsy this AM but easily arousable. PD has been going well at home without issues. Wife reports that she has not noticed any seizures since starting keppra. Has not had a bowel movement yet since being here. Patient denies any fevers, chills, chest pain, SOB, abd pain.   Medications:  Current Facility-Administered Medications  Medication Dose Route Frequency Provider Last Rate Last Admin   acetaminophen (TYLENOL) tablet 650 mg  650 mg Oral Q4H PRN CAshok Pall MD   650 mg at 02/25/22 1830   Or   acetaminophen (TYLENOL) suppository 650 mg  650 mg Rectal Q4H PRN CAshok Pall MD       allopurinol (ZYLOPRIM) tablet 300 mg  300 mg Oral Daily CAshok Pall MD   300 mg at 02/26/22 0932   atorvastatin  (LIPITOR) tablet 40 mg  40 mg Oral Daily CAshok Pall MD   40 mg at 02/26/22 0932   calcitRIOL (ROCALTROL) capsule 0.25 mcg  0.25 mcg Oral Daily CAshok Pall MD   0.25 mcg at 02/26/22 02263  Chlorhexidine Gluconate Cloth 2 % PADS 6 each  6 each Topical Daily CAshok Pall MD   6 each at 02/25/22 1834   dexamethasone (DECADRON) injection 4 mg  4 mg Intravenous Q6H CAshok Pall MD   4 mg at 02/26/22 1244   Followed by   [Derrill MemoON 02/27/2022] dexamethasone (DECADRON) injection 4 mg  4 mg Intravenous Q8H CAshok Pall MD       dialysis solution 1.5% low-MG/low-CA dianeal solution   Intraperitoneal Q24H Pham, Minh Q, RPH-CPP       docusate sodium (COLACE) capsule 100 mg  100 mg Oral Daily CAshok Pall MD   100 mg at 02/26/22 0932   ferric citrate (AURYXIA) tablet 630 mg  630 mg Oral TID WC CAshok Pall MD   630 mg at 02/26/22 0932   gabapentin (NEURONTIN) capsule 100 mg  100 mg Oral QHS PRN CAshok Pall MD       gentamicin ointment (GARAMYCIN) 0.1 % 1 Application  1 Application Topical Daily CAshok Pall MD   1 Application at [id number]/5602563  HYDROcodone-acetaminophen (NORCO/VICODIN) 5-325 MG per tablet 1 tablet  1 tablet Oral Q4H PRN CAshok Pall MD       HYDROmorphone (DILAUDID) injection 0.25-0.5 mg  0.25-0.5  mg Intravenous Q2H PRN Ashok Pall, MD       labetalol (NORMODYNE) injection 10-40 mg  10-40 mg Intravenous Q10 min PRN Ashok Pall, MD       labetalol (NORMODYNE) tablet 100 mg  100 mg Oral BID Ashok Pall, MD       levETIRAcetam (KEPPRA) IVPB 500 mg/100 mL premix  500 mg Intravenous BID Meyran, Ocie Cornfield, NP   Stopped at 02/26/22 0925   loratadine (CLARITIN) tablet 10 mg  10 mg Oral Daily Ashok Pall, MD   10 mg at 02/26/22 0932   LORazepam (ATIVAN) injection 4 mg  4 mg Intravenous Q1H PRN Meyran, Ocie Cornfield, NP   4 mg at 02/25/22 2340   naloxone (NARCAN) injection 0.08 mg  0.08 mg Intravenous PRN Ashok Pall, MD       ondansetron (ZOFRAN) tablet 4 mg  4  mg Oral Q4H PRN Ashok Pall, MD       Or   ondansetron (ZOFRAN) injection 4 mg  4 mg Intravenous Q4H PRN Ashok Pall, MD       Oral care mouth rinse  15 mL Mouth Rinse PRN Ashok Pall, MD       promethazine (PHENERGAN) tablet 12.5-25 mg  12.5-25 mg Oral Q4H PRN Ashok Pall, MD       torsemide (DEMADEX) tablet 100 mg  100 mg Oral Daily Ashok Pall, MD   100 mg at 02/26/22 4098     ALLERGIES Patient has no known allergies.  MEDICAL HISTORY Past Medical History:  Diagnosis Date   Anemia    Anemia in chronic kidney disease 03/20/2020   Bicuspid aortic valve    Blood transfusion without reported diagnosis    CAD (coronary artery disease)    proximal LAD 50%, mid LAD 50-60%, mid to distal LAD 99%; mid circumflex 20%; proximal RCA 20%, distal RCA 50%; PDA 60-70%, proximal posterior AV groove 50%, proximal posterior lateral 50%.  There was a question of possible LVOT gradient;  s/p Resolute DES to mid-distal LAD 10/2010;  echocardiogram 11/17/10: EF 55-60%, mild LVH, grade 1 diastolic dysfunction, normal aortic valve, mild MR, PASP 32.    Chicken pox    Dysrhythmia    A. Fib   Enlarged aorta ([name])    ESRD on peritoneal dialysis ([name])    Glaucoma    Gout    allopurinol '300mg'$ , colchicine prn in past, has not had flares   Headache    History of kidney stones    Hypercholesterolemia    With hypertriglyceridemia   Hypertension    Mild aortic stenosis    PAF (paroxysmal atrial fibrillation) ([place])    a. Documented on event monitor 01/2016.   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   SDH (subdural hematoma) ([place]) 08/2021   left   Sleep apnea    cpap nightly     SOCIAL HISTORY Social History   Socioeconomic History   Marital status: Married    Spouse name: Not on file   Number of children: 4   Years of education: Not on file   Highest  education level: Not on file  Occupational History   Occupation: Lawyer: [employer]  Tobacco Use   Smoking status: Never   Smokeless tobacco: Never  Vaping Use   Vaping Use: Never used  Substance and Sexual Activity  Alcohol use: No   Drug use: No   Sexual activity: Yes  Other Topics Concern   Not on file  Social History Narrative   Family: Married, 4 children, 9 grandkids      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)   BS at [employer]: reading, church activities- [employer]   Social Determinants of Health   Financial Resource Strain: Not on file  Food Insecurity: Not on file  Transportation Needs: Not on file  Physical Activity: Not on file  Stress: Not on file  Social Connections: Not on file  Intimate Partner Violence: Not on file     FAMILY HISTORY Family History  Problem Relation Age of Onset   Hypertension Father    CAD Father        CABG 14s   Kidney disease Father        Stage IV   Valvular heart disease Mother        AVR July 2015   Diabetes Paternal Grandmother    Colon cancer Neg Hx      Review of Systems: 12 systems were reviewed and negative except per HPI  Physical Exam: Vitals:   02/26/22 1000 02/26/22 1100  BP: 111/68 97/67  Pulse: (!) 106 83  Resp:  14  Temp:    SpO2: 97% 94%   Total I/O In: 235.2 [I.V.:124.4; IV Piggyback:110.8] Out: -   Intake/Output Summary (Last 24 hours) at 02/26/2022 1246 Last data filed at 02/26/2022 1000 Gross per 24 hour  Intake 1012.94 ml  Output 401 ml  Net 611.94 ml   General: tired appearing, NAD HEENT: anicteric sclera, MMM CV: normal rate, no murmurs, no edema Lungs: bilateral chest rise, normal wob Abd: soft, non-tender, non-distended, RLQ PD cath c/d/i Skin: no visible lesions or rashes Ext: no edema b/l LEs Neuro: drowsy but easily arousable, awake/alert  Test  Results Reviewed Lab Results  Component Value Date   NA 139 02/25/2022   K 4.5 02/25/2022   CL 99 02/25/2022   CO2 27 02/17/2022   BUN 60 (H) 02/25/2022   CREATININE 14.40 (H) 02/25/2022   GFR 23.61 (L) 10/24/2018   GLU 109 01/29/2020   CALCIUM 8.9 02/17/2022   ALBUMIN 2.6 (L) 10/12/2021   PHOS 8.6 (H) 10/12/2021    I have reviewed relevant outside healthcare records

## 2022-02-26 NOTE — Progress Notes (Signed)
Neurosurgery Service Progress Note  Subjective: Had two focal seizures overnight that he recalls then another larger that he does not, none since keppra was started, todd's resolved   Objective: Vitals:   02/26/22 0600 02/26/22 0700 02/26/22 0800 02/26/22 0900  BP: 112/82 105/71 101/75   Pulse: 90 84 89 83  Resp: 11 14 (!) 22 14  Temp:   97.9 F (36.6 C)   TempSrc:   Axillary   SpO2: 97% 98% 97% 96%  Weight:      Height:        Physical Exam: Awake/alert, Ox3, speech fluent with normal content, strength 5/5x4 with +RUE drift, incision c/d/i  Assessment & Plan: 61 y.o. man s/p cranioplasty, post-op Sz x3, CTH with post-op changes.  -will keep him in the unit overnight given the issues with renally dosing his keppra to make sure we have good seizure control, likely stepdown in the morning -sounds like PD tonight, will defer to the nephrology service regarding this -SCDs/TEDs  Judith Part  02/26/22 9:47 AM

## 2022-02-27 LAB — RENAL FUNCTION PANEL
Albumin: 2.4 g/dL — ABNORMAL LOW (ref 3.5–5.0)
Anion gap: 14 (ref 5–15)
BUN: 86 mg/dL — ABNORMAL HIGH (ref 8–23)
CO2: 24 mmol/L (ref 22–32)
Calcium: 8.8 mg/dL — ABNORMAL LOW (ref 8.9–10.3)
Chloride: 98 mmol/L (ref 98–111)
Creatinine, Ser: 12.93 mg/dL — ABNORMAL HIGH (ref 0.61–1.24)
GFR, Estimated: 4 mL/min — ABNORMAL LOW (ref >60)
Glucose, Bld: 135 mg/dL — ABNORMAL HIGH (ref 70–99)
Phosphorus: 6.7 mg/dL — ABNORMAL HIGH (ref 2.5–4.6)
Potassium: 5.2 mmol/L — ABNORMAL HIGH (ref 3.5–5.1)
Sodium: 136 mmol/L (ref 135–145)

## 2022-02-27 LAB — CBC
HCT: 24.2 % — ABNORMAL LOW (ref 39.0–52.0)
Hemoglobin: 8 g/dL — ABNORMAL LOW (ref 13.0–17.0)
MCH: 34.8 pg — ABNORMAL HIGH (ref 26.0–34.0)
MCHC: 33.1 g/dL (ref 30.0–36.0)
MCV: 105.2 fL — ABNORMAL HIGH (ref 80.0–100.0)
Platelets: 143 10*3/uL — ABNORMAL LOW (ref 150–400)
RBC: 2.3 MIL/uL — ABNORMAL LOW (ref 4.22–5.81)
RDW: 14.5 % (ref 11.5–15.5)
WBC: 20.7 10*3/uL — ABNORMAL HIGH (ref 4.0–10.5)
nRBC: 0 % (ref 0.0–0.2)

## 2022-02-27 MED ORDER — LEVETIRACETAM 500 MG PO TABS
500.0000 mg | ORAL_TABLET | Freq: Two times a day (BID) | ORAL | Status: DC
  Administered 2022-02-27: 500 mg via ORAL
  Filled 2022-02-27: qty 1

## 2022-02-27 MED ORDER — LEVETIRACETAM 250 MG PO TABS: 250.0000 mg | ORAL_TABLET | Freq: Two times a day (BID) | ORAL | 0 refills | Status: DC

## 2022-02-27 MED ORDER — LEVETIRACETAM 500 MG PO TABS: 500.0000 mg | ORAL_TABLET | Freq: Two times a day (BID) | ORAL | 0 refills | Status: DC

## 2022-02-27 MED ORDER — HYDROCODONE-ACETAMINOPHEN 5-325 MG PO TABS: 1.0000 | ORAL_TABLET | Freq: Four times a day (QID) | ORAL | 0 refills | Status: DC | PRN

## 2022-02-27 NOTE — Discharge Summary (Addendum)
Physician Discharge Summary  Patient ID: Daniel Solomon MRN: [id number] DOB/AGE: 10-14-1960 61 y.o.  Admit date: 02/25/2022 Discharge date: 02/27/2022  Admission Diagnoses: craniectomy    Discharge Diagnoses: same   Discharged Condition: good  Hospital Course: The patient was admitted on 02/25/2022 and taken to the operating room where the patient underwent cranioplasty. The patient tolerated the procedure well and was taken to the recovery room and then to the ICU in stable condition. He did have a couple seizures during the course of his stay with a small left SDH. The hospital course was routine. There were no complications. The wound remained clean dry and intact. Pt had appropriate head soreness. No complaints of new pain or new N/T/W. The patient remained afebrile with stable vital signs, and tolerated a regular diet. The patient continued to increase activities, and pain was well controlled with oral pain medications.   Consults: nephrology  Significant Diagnostic Studies:  Results for orders placed or performed during the hospital encounter of 02/25/22  MRSA Next Gen by PCR, Nasal   Specimen: Nasal Mucosa; Nasal Swab  Result Value Ref Range   MRSA by PCR Next Gen NOT DETECTED NOT DETECTED  Renal function panel  Result Value Ref Range   Sodium 136 135 - 145 mmol/L   Potassium 5.2 (H) 3.5 - 5.1 mmol/L   Chloride 98 98 - 111 mmol/L   CO2 24 22 - 32 mmol/L   Glucose, Bld 135 (H) 70 - 99 mg/dL   BUN 86 (H) 8 - 23 mg/dL   Creatinine, Ser 12.93 (H) 0.61 - 1.24 mg/dL   Calcium 8.8 (L) 8.9 - 10.3 mg/dL   Phosphorus 6.7 (H) 2.5 - 4.6 mg/dL   Albumin 2.4 (L) 3.5 - 5.0 g/dL   GFR, Estimated 4 (L) >60 mL/min   Anion gap 14 5 - 15  CBC  Result Value Ref Range   WBC 20.7 (H) 4.0 - 10.5 K/uL   RBC 2.30 (L) 4.22 - 5.81 MIL/uL   Hemoglobin 8.0 (L) 13.0 - 17.0 g/dL   HCT 24.2 (L) 39.0 - 52.0 %   MCV 105.2 (H) 80.0 - 100.0 fL   MCH 34.8 (H) 26.0 - 34.0 pg   MCHC 33.1 30.0 - 36.0  g/dL   RDW 14.5 11.5 - 15.5 %   Platelets 143 (L) 150 - 400 K/uL   nRBC 0.0 0.0 - 0.2 %  I-STAT, chem 8  Result Value Ref Range   Sodium 139 135 - 145 mmol/L   Potassium 4.5 3.5 - 5.1 mmol/L   Chloride 99 98 - 111 mmol/L   BUN 60 (H) 8 - 23 mg/dL   Creatinine, Ser 14.40 (H) 0.61 - 1.24 mg/dL   Glucose, Bld 86 70 - 99 mg/dL   Calcium, Ion 1.15 1.15 - 1.40 mmol/L   TCO2 29 22 - 32 mmol/L   Hemoglobin 8.5 (L) 13.0 - 17.0 g/dL   HCT 25.0 (L) 39.0 - 52.0 %    CT HEAD WO CONTRAST (5MM)  Result Date: 02/25/2022 CLINICAL DATA:  Status post cranioplasty EXAM: CT HEAD WITHOUT CONTRAST TECHNIQUE: Contiguous axial images were obtained from the base of the skull through the vertex without intravenous contrast. RADIATION DOSE REDUCTION: This exam was performed according to the departmental dose-optimization program which includes automated exposure control, adjustment of the mA and/or kV according to patient size and/or use of iterative reconstruction technique. COMPARISON:  None Available. FINDINGS: Brain: Left extra-axial mixed density collection with small amount of acute blood  at the most inferior aspect. Mild pneumocephalus. No midline shift or hydrocephalus. There is periventricular hypoattenuation compatible with chronic microvascular disease. Vascular: No hyperdense vessel or unexpected calcification. Skull: Status post left cranioplasty. Mixed gas and fluid in the overlying soft tissues. Sinuses/Orbits: No acute finding. Other: None. IMPRESSION: Status post left-sided cranioplasty with underline extra-axial collection with small amount of acute blood at the inferior aspect. No midline shift or other significant mass effect. Dr. Loyola Mast aware per EMR note at 19:56.  With Electronically Signed   By: Ulyses Jarred M.D.   On: 02/25/2022 20:07   CT HEAD WO CONTRAST (5MM)  Addendum Date: 02/07/2022   ADDENDUM REPORT: 02/07/2022 15:16 ADDENDUM: Correction: COMPARISON:  MRI brain 09/22/2021.  CT of the  head 12/15/2021. TECHNIQUE: Additional stealth images were provided. ADDITIONAL FINDINGS: None. Electronically Signed   By: Ronney Asters M.D.   On: 02/07/2022 15:16   Result Date: 02/07/2022 CLINICAL DATA:  Subdural hemorrhage EXAM: CT HEAD WITHOUT CONTRAST TECHNIQUE: Contiguous axial images were obtained from the base of the skull through the vertex without intravenous contrast. RADIATION DOSE REDUCTION: This exam was performed according to the departmental dose-optimization program which includes automated exposure control, adjustment of the mA and/or kV according to patient size and/or use of iterative reconstruction technique. COMPARISON:  MRI brain 09/22/2021.  CT head 02/14/2022. FINDINGS: Brain: Left frontal parietal craniectomy changes are again noted. There is no evidence for subdural hematoma. There is no acute intracranial hemorrhage, hydrocephalus, mass effect or midline shift. Gray-white matter distinction is preserved. Vascular: No hyperdense vessel or unexpected calcification. Skull: No acute fracture. Sinuses/Orbits: No acute finding. Other: None. IMPRESSION: 1. No acute intracranial process. Electronically Signed: By: Ronney Asters M.D. On: 01/28/2022 15:40    Antibiotics:  Anti-infectives (From admission, onward)    Start     Dose/Rate Route Frequency Ordered Stop   02/25/22 1630  ceFAZolin (ANCEF) IVPB 2g/100 mL premix  Status:  Discontinued        2 g 200 mL/hr over 30 Minutes Intravenous Every 8 hours 02/25/22 1628 02/25/22 1755   02/25/22 1629  ceFAZolin (ANCEF) 2-4 GM/100ML-% IVPB       Note to Pharmacy: Lawanda Cousins R: cabinet override      02/25/22 1629 02/26/22 0444   02/25/22 0600  ceFAZolin (ANCEF) IVPB 2g/100 mL premix        2 g 200 mL/hr over 30 Minutes Intravenous On call to O.R. 02/25/22 0559 02/25/22 0855       Discharge Exam: Blood pressure 119/77, pulse (!) 104, temperature 97.6 F (36.4 C), temperature source Oral, resp. rate (!) 26, height '5\' 8"'$  (1.727  m), weight 88.4 kg, SpO2 98 %. Neurologic: Grossly normal Ambulating and voiding well   Discharge Medications:   Allergies as of 02/27/2022   No Known Allergies      Medication List     TAKE these medications    allopurinol 300 MG tablet Commonly known as: ZYLOPRIM TAKE 1 TABLET BY MOUTH EVERY DAY   atorvastatin 40 MG tablet Commonly known as: LIPITOR TAKE 1 TABLET BY MOUTH EVERY DAY   Auryxia 1 GM 210 MG(Fe) tablet Generic drug: ferric citrate Take 630 mg by mouth 3 (three) times daily with meals.   calcitRIOL 0.25 MCG capsule Commonly known as: ROCALTROL Take 0.25 mcg by mouth daily.   docusate sodium 100 MG capsule Commonly known as: COLACE Take 100 mg by mouth daily.   gabapentin 100 MG capsule Commonly known as: NEURONTIN Take 100 mg  by mouth at bedtime as needed (pain).   gentamicin ointment 0.1 % Commonly known as: GARAMYCIN Apply 1 application topically daily.   HYDROcodone-acetaminophen 5-325 MG tablet Commonly known as: NORCO/VICODIN Take 1 tablet by mouth every 6 (six) hours as needed for severe pain. 1-2 tabs po tid prn What changed: reasons to take this   labetalol 100 MG tablet Commonly known as: NORMODYNE Take 100 mg by mouth 2 (two) times daily.   levETIRAcetam 250 MG tablet Commonly known as: Keppra Take 1 tablet (250 mg total) by mouth 2 (two) times daily. What changed: Another medication with the same name was added. Make sure you understand how and when to take each.   levETIRAcetam 500 MG tablet Commonly known as: KEPPRA Take 1 tablet (500 mg total) by mouth 2 (two) times daily. What changed: You were already taking a medication with the same name, and this prescription was added. Make sure you understand how and when to take each.   loratadine 10 MG tablet Commonly known as: CLARITIN Take 10 mg by mouth daily.   methocarbamol 500 MG tablet Commonly known as: ROBAXIN Take 2 tablets (1,000 mg total) by mouth 4 (four) times daily  as needed (for Headache). START with 1 pill, if not too drowsy take 2 pills.   MIRCERA IJ Inject into the skin as needed.   NON FORMULARY Pt uses a cpap nightly   torsemide 100 MG tablet Commonly known as: DEMADEX Take 1 tablet (100 mg total) by mouth daily.        Disposition: home   Final Dx: cranioplasty for reimplantation of bone flap  Discharge Instructions     Call MD for:  difficulty breathing, headache or visual disturbances   Complete by: As directed    Call MD for:  hives   Complete by: As directed    Call MD for:  persistant nausea and vomiting   Complete by: As directed    Call MD for:  redness, tenderness, or signs of infection (pain, swelling, redness, odor or green/yellow discharge around incision site)   Complete by: As directed    Call MD for:  severe uncontrolled pain   Complete by: As directed    Call MD for:  temperature >100.4   Complete by: As directed    Diet - low sodium heart healthy   Complete by: As directed    Increase activity slowly   Complete by: As directed    No wound care   Complete by: As directed           Signed: Ocie Cornfield Isa Kohlenberg 02/27/2022, 9:40 AM

## 2022-02-27 NOTE — Progress Notes (Signed)
IV's removed and patient taken downstairs in a wheelchair. Discharge instructions given to patient and spouse. Shaunessy Dobratz, Rande Brunt, RN

## 2022-02-27 NOTE — TOC Transition Note (Signed)
Transition of Care [facility]) - CM/SW Discharge Note   Patient Details  Name: Daniel Solomon MRN: [id number] Date of Birth: 09-05-1960  [facility] CM/SW Contact:  Bartholomew Crews, RN Phone Number: [phone] 02/27/2022, 8:39 AM   Clinical Narrative:     Patient to transition home today. Chart reviewed. No TOC needs identified at this time.   Final next level of care: Home/Self Care Barriers to Discharge: No Barriers Identified   Patient Goals and CMS Choice      Discharge Placement                         Discharge Plan and Services Additional resources added to the After Visit Summary for                                       Social Determinants of Health (SDOH) Interventions SDOH Screenings   Depression (PHQ2-9): Low Risk  (09/22/2020)  Tobacco Use: Low Risk  (02/17/2022)     Readmission Risk Interventions     No data to display

## 2022-02-27 NOTE — Progress Notes (Signed)
PD catheter cap changed to home adapter per D/C.

## 2022-03-01 ENCOUNTER — Encounter (HOSPITAL_COMMUNITY): Payer: Self-pay | Admitting: Neurosurgery

## 2022-03-11 NOTE — Op Note (Signed)
02/25/2022  3:55 PM  PATIENT:  Daniel Solomon  62 y.o. male Status post a craniectomy due to cerebral edema after a craniotomy for a subacute subdural hematoma PRE-OPERATIVE DIAGNOSIS:  Subdural hematoma, Status post craniectomy  POST-OPERATIVE DIAGNOSIS:  Subdural hematoma, Status post craniectomy  PROCEDURE:  Procedure(s): Cranioplasty with custom plate  SURGEON: Surgeon(s): Ashok Pall, MD  ASSISTANTS:none  ANESTHESIA:   general  EBL:  No intake/output data recorded.  BLOOD ADMINISTERED:none  CELL SAVER GIVEN:not used  COUNT:per nursing  DRAINS: none   SPECIMEN:  No Specimen  DICTATION: [name] was taken to the operating room, intubated, and placed under a general anesthetic without difficulty. He was positioned supine on the operating room table. HIs head was placed in a horseshoe head rest His head was prepped and draped in a sterile manner.  I opened the previous cranial incision, and reflected the scalp rostrally. I used cautery and curettes to define the edges of the craniectomy. Once completed I placed the cranial cranioplasty in place. I connected the peek implant with plates and screws. I closed the scalp approximating the galea, then scalp edges. I applied a sterile dressing. He was extubated and brought to the PACU.   PLAN OF CARE: Admit to inpatient   PATIENT DISPOSITION:  PACU - hemodynamically stable.   Delay start of Pharmacological VTE agent (>24hrs) due to surgical blood loss or risk of bleeding:  yes

## 2022-04-21 ENCOUNTER — Other Ambulatory Visit: Payer: Self-pay | Admitting: Neurosurgery

## 2022-04-21 DIAGNOSIS — Z9889 Other specified postprocedural states: Secondary | ICD-10-CM

## 2022-04-27 ENCOUNTER — Ambulatory Visit
Admission: RE | Admit: 2022-04-27 | Discharge: 2022-04-27 | Disposition: A | Discharge status: Home / Self Care | Payer: Managed Care, Other (non HMO) | Source: Ambulatory Visit | Attending: Neurosurgery | Admitting: Neurosurgery

## 2022-04-27 DIAGNOSIS — Z9889 Other specified postprocedural states: Secondary | ICD-10-CM

## 2022-05-26 ENCOUNTER — Other Ambulatory Visit: Payer: Self-pay | Admitting: Neurosurgery

## 2022-06-07 NOTE — Pre-Procedure Instructions (Signed)
Surgical Instructions    Your procedure is scheduled on Friday, June 17, 2022    Report to [facility] Main Entrance "A" at 05:30 A.M., then check in with the Admitting office.  Call this number if you have problems the morning of surgery:  [phone]  If you have any questions prior to your surgery date call [phone]: Open Monday-Friday 8am-4pm If you experience any cold or flu symptoms such as cough, fever, chills, shortness of breath, etc. between now and your scheduled surgery, please notify us at the above number.     Remember:  Do not eat or drink after midnight the night before your surgery    Take these medicines the morning of surgery with A SIP OF WATER :            allopurinol (ZYLOPRIM)            atorvastatin (LIPITOR)           calcitRIOL (ROCALTROL)           labetalol (NORMODYNE)           loratadine (CLARITIN)            As of today, STOP taking any Aspirin (unless otherwise instructed by your surgeon) Aleve, Naproxen, Ibuprofen, Motrin, Advil, Goody's, BC's, all herbal medications, fish oil, and all vitamins.                     Do NOT Smoke (Tobacco/Vaping) for 24 hours prior to your procedure.  If you use a CPAP at night, you may bring your mask/headgear for your overnight stay.   Contacts, glasses, piercing's, hearing aid's, dentures or partials may not be worn into surgery, please bring cases for these belongings.    For patients admitted to the hospital, discharge time will be determined by your treatment team.   Patients discharged the day of surgery will not be allowed to drive home, and someone needs to stay with them for 24 hours.  SURGICAL WAITING ROOM VISITATION Patients having surgery or a procedure may have no more than 2 support people in the waiting area - these visitors may rotate.   Children under the age of 39 must have an adult with them who is not the patient. If the patient needs to stay at the hospital during part of their  recovery, the visitor guidelines for inpatient rooms apply. Pre-op nurse will coordinate an appropriate time for 1 support person to accompany patient in pre-op.  This support person may not rotate.   Please refer to the [facility] website for the visitor guidelines for Inpatients (after your surgery is over and you are in a regular room).    Special instructions:   [place]- Preparing For Surgery  Before surgery, you can play an important role. Because skin is not sterile, your skin needs to be as free of germs as possible. You can reduce the number of germs on your skin by washing with CHG (chlorahexidine gluconate) Soap before surgery.  CHG is an antiseptic cleaner which kills germs and bonds with the skin to continue killing germs even after washing.    Oral Hygiene is also important to reduce your risk of infection.  Remember - BRUSH YOUR TEETH THE MORNING OF SURGERY WITH YOUR REGULAR TOOTHPASTE  Please do not use if you have an allergy to CHG or antibacterial soaps. If your skin becomes reddened/irritated stop using the CHG.  Do not shave (including legs and underarms) for at least  48 hours prior to first CHG shower. It is OK to shave your face.  Please follow these instructions carefully.   Shower the NIGHT BEFORE SURGERY and the MORNING OF SURGERY  If you chose to wash your hair, wash your hair first as usual with your normal shampoo.  After you shampoo, rinse your hair and body thoroughly to remove the shampoo.  Use CHG Soap as you would any other liquid soap. You can apply CHG directly to the skin and wash gently with a scrungie or a clean washcloth.   Apply the CHG Soap to your body ONLY FROM THE NECK DOWN.  Do not use on open wounds or open sores. Avoid contact with your eyes, ears, mouth and genitals (private parts). Wash Face and genitals (private parts)  with your normal soap.   Wash thoroughly, paying special attention to the area where your surgery will be  performed.  Thoroughly rinse your body with warm water from the neck down.  DO NOT shower/wash with your normal soap after using and rinsing off the CHG Soap.  Pat yourself dry with a CLEAN TOWEL.  Wear CLEAN PAJAMAS to bed the night before surgery  Place CLEAN SHEETS on your bed the night before your surgery  DO NOT SLEEP WITH PETS.   Day of Surgery: Take a shower with CHG soap. Do not wear jewelry  Do not wear lotions, powders, colognes, or deodorant. Men may shave face and neck. Do not bring valuables to the hospital.  [facility] is not responsible for any belongings or valuables. Wear Clean/Comfortable clothing the morning of surgery Remember to brush your teeth WITH YOUR REGULAR TOOTHPASTE.   Please read over the following fact sheets that you were given.    If you received a COVID test during your pre-op visit  it is requested that you wear a mask when out in public, stay away from anyone that may not be feeling well and notify your surgeon if you develop symptoms. If you have been in contact with anyone that has tested positive in the last 10 days please notify you surgeon.

## 2022-06-07 NOTE — Pre-Procedure Instructions (Signed)
Surgical Instructions    Your procedure is scheduled on Friday, June 17, 2022    Report to [facility] Main Entrance "A" at 05:30 A.M., then check in with the Admitting office.  Call this number if you have problems the morning of surgery:  [phone]  If you have any questions prior to your surgery date call [phone]: Open Monday-Friday 8am-4pm If you experience any cold or flu symptoms such as cough, fever, chills, shortness of breath, etc. between now and your scheduled surgery, please notify us at the above number.     Remember:  Do not eat or drink after midnight the night before your surgery    Take these medicines the morning of surgery with A SIP OF WATER :            allopurinol (ZYLOPRIM)            atorvastatin (LIPITOR)           calcitRIOL (ROCALTROL)           labetalol (NORMODYNE)            LevETIRAcetam (KEPPRA)          Tenapanor HCl, CKD, (XPHOZAH)            loratadine (CLARITIN)            As of today, STOP taking any Aspirin (unless otherwise instructed by your surgeon) Aleve, Naproxen, Ibuprofen, Motrin, Advil, Goody's, BC's, all herbal medications, fish oil, and all vitamins.                     Do NOT Smoke (Tobacco/Vaping) for 24 hours prior to your procedure.  If you use a CPAP at night, you may bring your mask/headgear for your overnight stay.   Contacts, glasses, piercing's, hearing aid's, dentures or partials may not be worn into surgery, please bring cases for these belongings.    For patients admitted to the hospital, discharge time will be determined by your treatment team.   Patients discharged the day of surgery will not be allowed to drive home, and someone needs to stay with them for 24 hours.  SURGICAL WAITING ROOM VISITATION Patients having surgery or a procedure may have no more than 2 support people in the waiting area - these visitors may rotate.   Children under the age of 87 must have an adult with them who is not the  patient. If the patient needs to stay at the hospital during part of their recovery, the visitor guidelines for inpatient rooms apply. Pre-op nurse will coordinate an appropriate time for 1 support person to accompany patient in pre-op.  This support person may not rotate.   Please refer to the [facility] website for the visitor guidelines for Inpatients (after your surgery is over and you are in a regular room).    Special instructions:   [place]- Preparing For Surgery  Before surgery, you can play an important role. Because skin is not sterile, your skin needs to be as free of germs as possible. You can reduce the number of germs on your skin by washing with CHG (chlorahexidine gluconate) Soap before surgery.  CHG is an antiseptic cleaner which kills germs and bonds with the skin to continue killing germs even after washing.    Oral Hygiene is also important to reduce your risk of infection.  Remember - BRUSH YOUR TEETH THE MORNING OF SURGERY WITH YOUR REGULAR TOOTHPASTE  Please do not use if you have  an allergy to CHG or antibacterial soaps. If your skin becomes reddened/irritated stop using the CHG.  Do not shave (including legs and underarms) for at least 48 hours prior to first CHG shower. It is OK to shave your face.  Please follow these instructions carefully.   Shower the NIGHT BEFORE SURGERY and the MORNING OF SURGERY  If you chose to wash your hair, wash your hair first as usual with your normal shampoo.  After you shampoo, rinse your hair and body thoroughly to remove the shampoo.  Use CHG Soap as you would any other liquid soap. You can apply CHG directly to the skin and wash gently with a scrungie or a clean washcloth.   Apply the CHG Soap to your body ONLY FROM THE NECK DOWN.  Do not use on open wounds or open sores. Avoid contact with your eyes, ears, mouth and genitals (private parts). Wash Face and genitals (private parts)  with your normal soap.   Wash thoroughly,  paying special attention to the area where your surgery will be performed.  Thoroughly rinse your body with warm water from the neck down.  DO NOT shower/wash with your normal soap after using and rinsing off the CHG Soap.  Pat yourself dry with a CLEAN TOWEL.  Wear CLEAN PAJAMAS to bed the night before surgery  Place CLEAN SHEETS on your bed the night before your surgery  DO NOT SLEEP WITH PETS.   Day of Surgery: Take a shower with CHG soap. Do not wear jewelry or makeup Do not wear lotions, powders, perfumes/colognes, or deodorant. Do not shave 48 hours prior to surgery.  Men may shave face and neck. Do not bring valuables to the hospital.  [facility] is not responsible for any belongings or valuables. Do not wear nail polish, gel polish, artificial nails, or any other type of covering on natural nails (fingers and toes) If you have artificial nails or gel coating that need to be removed by a nail salon, please have this removed prior to surgery. Artificial nails or gel coating may interfere with anesthesia's ability to adequately monitor your vital signs. Wear Clean/Comfortable clothing the morning of surgery Remember to brush your teeth WITH YOUR REGULAR TOOTHPASTE.   Please read over the following fact sheets that you were given.    If you received a COVID test during your pre-op visit  it is requested that you wear a mask when out in public, stay away from anyone that may not be feeling well and notify your surgeon if you develop symptoms. If you have been in contact with anyone that has tested positive in the last 10 days please notify you surgeon.

## 2022-06-08 ENCOUNTER — Other Ambulatory Visit: Payer: Self-pay

## 2022-06-08 ENCOUNTER — Encounter (HOSPITAL_COMMUNITY): Payer: Self-pay

## 2022-06-08 ENCOUNTER — Encounter (HOSPITAL_COMMUNITY)
Admission: RE | Admit: 2022-06-08 | Discharge: 2022-06-08 | Disposition: A | Discharge status: Home / Self Care | Payer: Managed Care, Other (non HMO) | Source: Ambulatory Visit | Attending: Neurosurgery | Admitting: Neurosurgery

## 2022-06-08 VITALS — BP 117/83 | HR 91 | Temp 97.6°F | Resp 18 | Ht 68.0 in | Wt 214.5 lb

## 2022-06-08 DIAGNOSIS — Z992 Dependence on renal dialysis: Secondary | ICD-10-CM | POA: Insufficient documentation

## 2022-06-08 DIAGNOSIS — E7800 Pure hypercholesterolemia, unspecified: Secondary | ICD-10-CM | POA: Diagnosis not present

## 2022-06-08 DIAGNOSIS — I120 Hypertensive chronic kidney disease with stage 5 chronic kidney disease or end stage renal disease: Secondary | ICD-10-CM | POA: Insufficient documentation

## 2022-06-08 DIAGNOSIS — Z01812 Encounter for preprocedural laboratory examination: Secondary | ICD-10-CM | POA: Diagnosis present

## 2022-06-08 DIAGNOSIS — D631 Anemia in chronic kidney disease: Secondary | ICD-10-CM | POA: Insufficient documentation

## 2022-06-08 DIAGNOSIS — E669 Obesity, unspecified: Secondary | ICD-10-CM | POA: Insufficient documentation

## 2022-06-08 DIAGNOSIS — M109 Gout, unspecified: Secondary | ICD-10-CM | POA: Diagnosis not present

## 2022-06-08 DIAGNOSIS — G4733 Obstructive sleep apnea (adult) (pediatric): Secondary | ICD-10-CM | POA: Diagnosis not present

## 2022-06-08 DIAGNOSIS — I480 Paroxysmal atrial fibrillation: Secondary | ICD-10-CM | POA: Diagnosis not present

## 2022-06-08 DIAGNOSIS — I2510 Atherosclerotic heart disease of native coronary artery without angina pectoris: Secondary | ICD-10-CM | POA: Insufficient documentation

## 2022-06-08 DIAGNOSIS — I350 Nonrheumatic aortic (valve) stenosis: Secondary | ICD-10-CM | POA: Insufficient documentation

## 2022-06-08 DIAGNOSIS — H409 Unspecified glaucoma: Secondary | ICD-10-CM | POA: Diagnosis not present

## 2022-06-08 DIAGNOSIS — Z952 Presence of prosthetic heart valve: Secondary | ICD-10-CM | POA: Insufficient documentation

## 2022-06-08 DIAGNOSIS — G96198 Other disorders of meninges, not elsewhere classified: Secondary | ICD-10-CM | POA: Insufficient documentation

## 2022-06-08 DIAGNOSIS — Z955 Presence of coronary angioplasty implant and graft: Secondary | ICD-10-CM | POA: Insufficient documentation

## 2022-06-08 DIAGNOSIS — N186 End stage renal disease: Secondary | ICD-10-CM | POA: Insufficient documentation

## 2022-06-08 DIAGNOSIS — Z01818 Encounter for other preprocedural examination: Secondary | ICD-10-CM

## 2022-06-08 DIAGNOSIS — Z9889 Other specified postprocedural states: Secondary | ICD-10-CM | POA: Diagnosis not present

## 2022-06-08 DIAGNOSIS — Z6832 Body mass index (BMI) 32.0-32.9, adult: Secondary | ICD-10-CM | POA: Diagnosis not present

## 2022-06-08 LAB — BASIC METABOLIC PANEL
Anion gap: 14 (ref 5–15)
BUN: 65 mg/dL — ABNORMAL HIGH (ref 8–23)
CO2: 25 mmol/L (ref 22–32)
Calcium: 9.6 mg/dL (ref 8.9–10.3)
Chloride: 100 mmol/L (ref 98–111)
Creatinine, Ser: 11.45 mg/dL — ABNORMAL HIGH (ref 0.61–1.24)
GFR, Estimated: 5 mL/min — ABNORMAL LOW (ref >60)
Glucose, Bld: 93 mg/dL (ref 70–99)
Potassium: 4.2 mmol/L (ref 3.5–5.1)
Sodium: 139 mmol/L (ref 135–145)

## 2022-06-08 LAB — CBC
HCT: 38.5 % — ABNORMAL LOW (ref 39.0–52.0)
Hemoglobin: 12.5 g/dL — ABNORMAL LOW (ref 13.0–17.0)
MCH: 33.4 pg (ref 26.0–34.0)
MCHC: 32.5 g/dL (ref 30.0–36.0)
MCV: 102.9 fL — ABNORMAL HIGH (ref 80.0–100.0)
Platelets: 103 10*3/uL — ABNORMAL LOW (ref 150–400)
RBC: 3.74 MIL/uL — ABNORMAL LOW (ref 4.22–5.81)
RDW: 14 % (ref 11.5–15.5)
WBC: 7.5 10*3/uL (ref 4.0–10.5)
nRBC: 0 % (ref 0.0–0.2)

## 2022-06-08 LAB — TYPE AND SCREEN
ABO/RH(D): O POS
Antibody Screen: NEGATIVE

## 2022-06-08 NOTE — Progress Notes (Signed)
PCP - Dr. Tana Conch Cardiologist - Dr. Verne Carrow  PPM/ICD - denies  Chest x-ray - 10/05/21 EKG - 01/19/22 Stress Test - pt states he had one at [facility] last year, normal per pt ECHO - 01/19/22 Cardiac Cath - 12/04/20  Sleep Study - 12/13/10, OSA+ CPAP - nightly   DM- denies  ASA/Blood Thinner Instructions: n/a   ERAS Protcol - no, NPO   COVID TEST- n/a   Anesthesia review: yes, cardiac hx  Patient denies shortness of breath, fever, cough and chest pain at PAT appointment   All instructions explained to the patient, with a verbal understanding of the material. Patient agrees to go over the instructions while at home for a better understanding.  The opportunity to ask questions was provided.

## 2022-06-10 NOTE — Anesthesia Preprocedure Evaluation (Addendum)
Anesthesia Evaluation  Patient identified by MRN, date of birth, ID band Patient awake    Reviewed: Allergy & Precautions, NPO status , Patient's Chart, lab work & pertinent test results  History of Anesthesia Complications Negative for: history of anesthetic complications  Airway Mallampati: III  TM Distance: >3 FB Neck ROM: Full    Dental  (+) Teeth Intact, Dental Advisory Given   Pulmonary sleep apnea and Continuous Positive Airway Pressure Ventilation    breath sounds clear to auscultation       Cardiovascular hypertension, Pt. on medications and Pt. on home beta blockers + CAD and + Cardiac Stents   Rhythm:Regular   1. Left ventricular ejection fraction, by estimation, is 60 to 65%. The  left ventricle has normal function. The left ventricle has no regional  wall motion abnormalities. There is mild left ventricular hypertrophy.  Left ventricular diastolic function  could not be evaluated.   2. Right ventricular systolic function is normal. The right ventricular  size is normal.   3. Left atrial size was moderately dilated.   4. The mitral valve is abnormal. Mild mitral valve regurgitation.   5. The aortic valve has been repaired/replaced. Aortic valve  regurgitation is not visualized. There is a 26 mm Sapien prosthetic (TAVR)  valve present in the aortic position. Echo findings are consistent with  normal structure and function of the aortic  valve prosthesis. Aortic valve area, by VTI measures 2.56 cm. Aortic  valve mean gradient measures 10.0 mmHg. Aortic valve Vmax measures 2.32  m/s. Peak gradient 21.5 mmHg, DI 0.48. No perivalvular leak.   6. Aortic dilatation noted. There is mild dilatation of the ascending  aorta, measuring 43 mm.     Neuro/Psych  Headaches  negative psych ROS   GI/Hepatic negative GI ROS, Neg liver ROS,,,  Endo/Other    Renal/GU ESRF and DialysisRenal diseaseLab Results      Component                 Value               Date                      NA                       138                 06/17/2022                K                        4.5                 06/17/2022                CO2                      25                  06/08/2022                GLUCOSE                  85                  06/17/2022  BUN                      78 (H)              06/17/2022                CREATININE               14.10 (H)           06/17/2022                CALCIUM                  9.6                 06/08/2022                EGFR                     4 (L)               11/19/2020                GFRNONAA                 5 (L)               06/08/2022                Musculoskeletal negative musculoskeletal ROS (+)    Abdominal   Peds  Hematology negative hematology ROS (+) Lab Results      Component                Value               Date                      WBC                      7.5                 06/08/2022                HGB                      14.3                06/17/2022                HCT                      42.0                06/17/2022                MCV                      102.9 (H)           06/08/2022                PLT                      103 (L)             06/08/2022              Anesthesia Other Findings   Reproductive/Obstetrics  Anesthesia Physical Anesthesia Plan  ASA: 3  Anesthesia Plan: General   Post-op Pain Management: Minimal or no pain anticipated   Induction: Intravenous  PONV Risk Score and Plan: 2 and Ondansetron and Dexamethasone  Airway Management Planned:   Additional Equipment:   Intra-op Plan:   Post-operative Plan: Extubation in OR  Informed Consent: I have reviewed the patients History and Physical, chart, labs and discussed the procedure including the risks, benefits and alternatives for the proposed anesthesia with the patient or authorized  representative who has indicated his/her understanding and acceptance.     Dental advisory given  Plan Discussed with: CRNA  Anesthesia Plan Comments: (PAT note written 06/10/2022 by Shonna Chock, PA-C.  )       Anesthesia Quick Evaluation

## 2022-06-10 NOTE — Progress Notes (Signed)
Anesthesia Chart Review:  Case: [id number] Date/Time: 06/17/22 0715   Procedure: REVISION OF CRANIOPLASTY   Anesthesia type: General   Pre-op diagnosis: Skull defect   [facility]   Surgeons: Coletta Memos, MD       DISCUSSION: Patient is a 62 year old male scheduled for the above procedure.   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), dilated ascending thoracic aorta (41mm 02/18/21 echo), afib (diagnosed 2017), ESRD (peritoneal dialysis, PD catheter placed 03/06/20), anemia, OSA (uses CPAP), glaucoma, right eye retinal artery occlusion (2017), gout, renal cysts (03/26/21 MRI), left SDH (09/22/21; s/p Onyx embolization of left middle meningeal artery 09/28/21 with acute/subacute SDH 10/05/21 requiring craniotomy for left SDH evacuation and craniectomy for right epidural/SDH 10/07/21; cranioplasty with custom plate 45/40/98), obesity.     Last cardiology visit was on 01/19/22 with Cline Crock, PA-C.  No chest pain, SOB, LE edema, orthopnea, or syncope.  No palpitations.  He was walking daily for 20 minutes.  He had previously not been on b-blocker due to bradycardia. He was no longer on ASA or Eliquis due to recurrent SDH requiring craniotomy. At that time he was not interested in evaluation for Watchman device. He was hoping to proceed with renal transplant at some point, and she notes, "He is cleared from a cardiac standpoint to proceed with this." Follow-up around 07/2022 planned.    Preoperative labs noted. BUN 65, Creatinine 11.45, glucose 93, H/H 12.5/38.5, PLT 103K (~ 86-143K since 09/2021, primarily ~ 100-120K). T&S done with labs since he had consistently had HGB < 10 from August through December 2023. He will also need an ISTAT on arrival. He does PD nightly.   Anesthesia team to evaluate on the day of surgery.    VS: BP 117/83   Pulse 91   Temp 36.4 C (Oral)    Resp 18   Ht 5\' 8"  (1.727 m)   Wt 97.3 kg   SpO2 98%   BMI 32.61 kg/m    PROVIDERS: Shelva Majestic, MD is PCP  Verne Carrow, MD is structural heart cardiologist Coralyn Helling, MD is pulmonologist ([facility]) Sabra Heck, MD is nephrologist is with [facility]. He is undergoing Renal Transplant evaluation at [facility].   LABS: PAT labs noted. He is for iSTAT on the day of surgery given ESRD. (all labs ordered are listed, but only abnormal results are displayed)  Labs Reviewed  BASIC METABOLIC PANEL - Abnormal; Notable for the following components:      Result Value   BUN 65 (*)    Creatinine, Ser 11.45 (*)    GFR, Estimated 5 (*)    All other components within normal limits  CBC - Abnormal; Notable for the following components:   RBC 3.74 (*)    Hemoglobin 12.5 (*)    HCT 38.5 (*)    MCV 102.9 (*)    Platelets 103 (*)    All other components within normal limits  TYPE AND SCREEN     IMAGES: CT Head 04/27/22: IMPRESSION: 1. Interval resolution of the previously seen left frontoparietal convexity extra-axial collection. 2. Large pseudomeningocele overlying the left hemicranioplasty with loosening of three screws, as described above. [See Results Review for full report]   1V CXR 10/05/21: FINDINGS: Endotracheal tube is seen 6.6 cm above the carina. Nasogastric tube tip is within the expected gastric fundus. Lungs are clear. No pneumothorax or pleural  effusion. 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 Cardiac size is within normal limits. Pulmonary vascularity is normal. No acute bone abnormality. IMPRESSION: 1. Support tubes in appropriate position. 2. No active disease.     EKG: 01/19/22: Afib at 88 bpm     CV: Echo 01/19/22: IMPRESSIONS   1. Left ventricular ejection fraction, by estimation, is 60 to 65%. The  left ventricle has normal function. The left ventricle has no regional  wall motion abnormalities. There is  mild left ventricular hypertrophy.  Left ventricular diastolic function  could not be evaluated.   2. Right ventricular systolic function is normal. The right ventricular  size is normal.   3. Left atrial size was moderately dilated.   4. The mitral valve is abnormal. Mild mitral valve regurgitation.   5. The aortic valve has been repaired/replaced. Aortic valve  regurgitation is not visualized. There is a 26 mm Sapien prosthetic (TAVR)  valve present in the aortic position. Echo findings are consistent with  normal structure and function of the aortic  valve prosthesis. Aortic valve area, by VTI measures 2.56 cm. Aortic  valve mean gradient measures 10.0 mmHg. Aortic valve Vmax measures 2.32  m/s. Peak gradient 21.5 mmHg, DI 0.48. No perivalvular leak.   6. Aortic dilatation noted. There is mild dilatation of the ascending  aorta, measuring 43 mm.  - Comparison(s): Changes from prior study are noted. 02/18/2021: LVEF  60-65%, TAVR mean gradient 18 mmHG, DI 0.45, trivial PVL, ascending aorta  41 mm.      Stress Echo 06/08/21 ([facility]): STRESS ECHO  Normal left ventricular function and global wall motion with stress.  There was normal increase in global LV function post exercise.  Negative exercise echocardiography for inducible ischemia at target  heart rate.      US Carotid 06/08/21 ([facility]): Not viewable in Care Everywhere. Last available results are from 01/28/16: 1 to 39% bilateral ICA stenosis, normal subclavian arteries bilaterally, patent bilateral vertebral arteries with antegrade flow, with abnormal waveforms in the right vertebral artery.     RHC/LHC 12/04/20:   Prox LAD to Mid LAD lesion is 50% stenosed.   Mid LAD lesion is 50% stenosed.   Dist LAD-1 lesion is 20% stenosed.   Dist LAD-2 lesion is 50% stenosed.   1st Mrg lesion is 30% stenosed.   Prox Cx to Mid Cx lesion is 40% stenosed.   Dist RCA lesion is 70% stenosed. Moderate diffuse LAD disease. Patent mid  to distal LAD stent Mild mid Circumflex disease Large dominant RCA with moderately severe distal stenosis at the bifurcation into the PDA and posterolateral artery.    Recommendations: Will continue workup for AVR vs TAVR. Will arrange CT scans next. His distal RCA stenosis could be addressed with PCI but given his lack of angina or ischemic symptoms this would be best managed medically. Given his young age, will still need to consider surgical AVR as well. Further planning will be based on his CT scans.      Cardiac event monitor 01/28/16: Sinus rhythm with premature atrial contractions Atrial fibrillation with controlled heart rate     Past Medical History:  Diagnosis Date   Anemia    Anemia in chronic kidney disease 03/20/2020   Bicuspid aortic valve    Blood transfusion without reported diagnosis    CAD (coronary artery disease)    proximal LAD 50%, mid LAD 50-60%, mid to distal LAD 99%; mid circumflex 20%; proximal RCA 20%, distal RCA 50%; PDA  60-70%, proximal posterior AV groove 50%, proximal posterior lateral 50%.  There was a question of possible LVOT gradient;  s/p Resolute DES to mid-distal LAD 10/2010;  echocardiogram 11/17/10: EF 55-60%, mild LVH, grade 1 diastolic dysfunction, normal aortic valve, mild MR, PASP 32.    Chicken pox    Dysrhythmia    A. Fib   Enlarged aorta    ESRD on peritoneal dialysis    Glaucoma    Gout    allopurinol , colchicine prn in past, has not had flares   Headache    History of kidney stones    Hypercholesterolemia    With hypertriglyceridemia   Hypertension    Mild aortic stenosis    PAF (paroxysmal atrial fibrillation)    a. Documented on event monitor 01/2016.   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   SDH (subdural hematoma) 08/2021   left   Sleep apnea    cpap nightly    Past Surgical  History:  Procedure Laterality Date   COLONOSCOPY  2018   CORONARY STENT PLACEMENT  02/28/2010   CRANIOPLASTY N/A 02/25/2022   Procedure: Cranioplasty with custom plate;  Surgeon: Coletta Memos, MD;  Location: [facility] OR;  Service: Neurosurgery;  Laterality: N/A;   CRANIOTOMY Left 10/05/2021   Procedure: CRANIOTOMY HEMATOMA EVACUATION SUBDURAL;  Surgeon: Coletta Memos, MD;  [facility];  Service: Neurosurgery;  Laterality: Left;   CRANIOTOMY Left 10/07/2021   Procedure: Craniectomy for Recurrent  Intracranial Hematoma;  Surgeon: Coletta Memos, MD;  Location: [facility] OR;  Service: Neurosurgery;  Laterality: Left;   CYSTOSCOPY/RETROGRADE/URETEROSCOPY  03/26/2012   Procedure: CYSTOSCOPY/RETROGRADE/URETEROSCOPY;  Surgeon: Garnett Farm, MD;  Location: [facility];  Service: Urology;  Laterality: Bilateral;  CYSTOSCOPY BILATERAL RETROGRADE PYELOGRAM AND POSSIBLE URETEROSCOPY   HERNIA REPAIR  2021   Umbilical Repair   INTRAOPERATIVE TRANSTHORACIC ECHOCARDIOGRAM N/A 01/19/2021   Procedure: INTRAOPERATIVE TRANSTHORACIC ECHOCARDIOGRAM;  Surgeon: Kathleene Hazel, MD;  Location: [facility] OR;  Service: Open Heart Surgery;  Laterality: N/A;   IR ANGIO EXTERNAL CAROTID SEL EXT CAROTID UNI L MOD SED  09/28/2021   IR ANGIO INTRA EXTRACRAN SEL INTERNAL CAROTID UNI L MOD SED  09/28/2021   IR ANGIOGRAM FOLLOW UP STUDY  09/28/2021   IR ANGIOGRAM FOLLOW UP STUDY  09/30/2021   IR NEURO EACH ADD'L AFTER BASIC UNI LEFT (MS)  09/28/2021   IR NEURO EACH ADD'L AFTER BASIC UNI RIGHT (MS)  09/30/2021   IR TRANSCATH/EMBOLIZ  09/28/2021   RADIOLOGY WITH ANESTHESIA N/A 09/28/2021   Procedure: Middle meningeal artery embolization;  Surgeon: Lisbeth Renshaw, MD;  Location: [facility] OR;  Service: Radiology;  Laterality: N/A;   RIGHT HEART CATH AND CORONARY ANGIOGRAPHY N/A 12/04/2020   Procedure: RIGHT HEART CATH AND CORONARY ANGIOGRAPHY;  Surgeon: Kathleene Hazel, MD;  [facility];  Service:  Cardiovascular;  Laterality: N/A;   TRANSCATHETER AORTIC VALVE REPLACEMENT, TRANSFEMORAL N/A 01/19/2021   Procedure: TRANSCATHETER AORTIC VALVE REPLACEMENT, TRANSFEMORAL USING A EDWARDS VALVE.;  Surgeon: Kathleene Hazel, MD;  Location[facility];  Service: Open Heart Surgery;  Laterality: N/A;    MEDICATIONS:  allopurinol (ZYLOPRIM) 300 MG tablet   atorvastatin (LIPITOR) 40 MG tablet   AURYXIA 1 GM 210 MG(Fe) tablet   calcitRIOL (ROCALTROL) 0.25 MCG capsule   gabapentin (NEURONTIN) 100 MG capsule   gentamicin cream (GARAMYCIN) 0.1 %  HYDROcodone-acetaminophen (NORCO/VICODIN) 5-325 MG tablet   labetalol (NORMODYNE) 100 MG tablet   levETIRAcetam (KEPPRA) 250 MG tablet   levETIRAcetam (KEPPRA) 500 MG tablet   loratadine (CLARITIN) 10 MG tablet   methocarbamol (ROBAXIN) 500 MG tablet   Methoxy PEG-Epoetin Beta (MIRCERA IJ)   NON FORMULARY   Tenapanor HCl, CKD, (XPHOZAH) 30 MG TABS   torsemide (DEMADEX) 100 MG tablet   No current facility-administered medications for this encounter.  He is no longer on Keppra.   Shonna Chock, PA-C Surgical Short Stay/Anesthesiology [facility] Phone [phone] [facility] Phone [phone] 06/10/2022 5:02 PM

## 2022-06-17 ENCOUNTER — Encounter (HOSPITAL_COMMUNITY)
Admission: RE | Disposition: A | Discharge status: Home / Self Care | Payer: Self-pay | Source: Home / Self Care | Attending: Neurosurgery

## 2022-06-17 ENCOUNTER — Inpatient Hospital Stay (HOSPITAL_COMMUNITY): Payer: Managed Care, Other (non HMO) | Admitting: Certified Registered Nurse Anesthetist

## 2022-06-17 ENCOUNTER — Other Ambulatory Visit: Payer: Self-pay

## 2022-06-17 ENCOUNTER — Inpatient Hospital Stay (HOSPITAL_COMMUNITY): Payer: Managed Care, Other (non HMO) | Admitting: Vascular Surgery

## 2022-06-17 ENCOUNTER — Encounter (HOSPITAL_COMMUNITY): Payer: Self-pay | Admitting: Neurosurgery

## 2022-06-17 ENCOUNTER — Inpatient Hospital Stay (HOSPITAL_COMMUNITY)
Admission: RE | Admit: 2022-06-17 | Discharge: 2022-06-26 | DRG: 025 | Disposition: A | Discharge status: Home / Self Care | Payer: Managed Care, Other (non HMO) | Attending: Neurosurgery | Admitting: Neurosurgery

## 2022-06-17 DIAGNOSIS — G8191 Hemiplegia, unspecified affecting right dominant side: Secondary | ICD-10-CM | POA: Diagnosis present

## 2022-06-17 DIAGNOSIS — Z955 Presence of coronary angioplasty implant and graft: Secondary | ICD-10-CM

## 2022-06-17 DIAGNOSIS — I120 Hypertensive chronic kidney disease with stage 5 chronic kidney disease or end stage renal disease: Secondary | ICD-10-CM | POA: Diagnosis present

## 2022-06-17 DIAGNOSIS — Z992 Dependence on renal dialysis: Secondary | ICD-10-CM

## 2022-06-17 DIAGNOSIS — I2510 Atherosclerotic heart disease of native coronary artery without angina pectoris: Secondary | ICD-10-CM | POA: Diagnosis not present

## 2022-06-17 DIAGNOSIS — G9608 Other cranial cerebrospinal fluid leak: Principal | ICD-10-CM | POA: Diagnosis present

## 2022-06-17 DIAGNOSIS — E781 Pure hyperglyceridemia: Secondary | ICD-10-CM | POA: Diagnosis present

## 2022-06-17 DIAGNOSIS — Y838 Other surgical procedures as the cause of abnormal reaction of the patient, or of later complication, without mention of misadventure at the time of the procedure: Secondary | ICD-10-CM | POA: Diagnosis present

## 2022-06-17 DIAGNOSIS — M952 Other acquired deformity of head: Principal | ICD-10-CM

## 2022-06-17 DIAGNOSIS — Z8249 Family history of ischemic heart disease and other diseases of the circulatory system: Secondary | ICD-10-CM | POA: Diagnosis not present

## 2022-06-17 DIAGNOSIS — T85628A Displacement of other specified internal prosthetic devices, implants and grafts, initial encounter: Secondary | ICD-10-CM | POA: Diagnosis present

## 2022-06-17 DIAGNOSIS — G4733 Obstructive sleep apnea (adult) (pediatric): Secondary | ICD-10-CM | POA: Diagnosis present

## 2022-06-17 DIAGNOSIS — Z87820 Personal history of traumatic brain injury: Secondary | ICD-10-CM

## 2022-06-17 DIAGNOSIS — I959 Hypotension, unspecified: Secondary | ICD-10-CM | POA: Diagnosis not present

## 2022-06-17 DIAGNOSIS — E7800 Pure hypercholesterolemia, unspecified: Secondary | ICD-10-CM | POA: Diagnosis present

## 2022-06-17 DIAGNOSIS — M109 Gout, unspecified: Secondary | ICD-10-CM | POA: Diagnosis present

## 2022-06-17 DIAGNOSIS — Z833 Family history of diabetes mellitus: Secondary | ICD-10-CM

## 2022-06-17 DIAGNOSIS — Z841 Family history of disorders of kidney and ureter: Secondary | ICD-10-CM

## 2022-06-17 DIAGNOSIS — I480 Paroxysmal atrial fibrillation: Secondary | ICD-10-CM | POA: Diagnosis present

## 2022-06-17 DIAGNOSIS — N186 End stage renal disease: Secondary | ICD-10-CM | POA: Diagnosis present

## 2022-06-17 DIAGNOSIS — N2581 Secondary hyperparathyroidism of renal origin: Secondary | ICD-10-CM | POA: Diagnosis present

## 2022-06-17 DIAGNOSIS — Q231 Congenital insufficiency of aortic valve: Secondary | ICD-10-CM

## 2022-06-17 DIAGNOSIS — G919 Hydrocephalus, unspecified: Secondary | ICD-10-CM | POA: Diagnosis present

## 2022-06-17 DIAGNOSIS — G9761 Postprocedural hematoma of a nervous system organ or structure following a nervous system procedure: Secondary | ICD-10-CM | POA: Diagnosis present

## 2022-06-17 DIAGNOSIS — D631 Anemia in chronic kidney disease: Secondary | ICD-10-CM | POA: Diagnosis present

## 2022-06-17 DIAGNOSIS — Z953 Presence of xenogenic heart valve: Secondary | ICD-10-CM

## 2022-06-17 DIAGNOSIS — Z87442 Personal history of urinary calculi: Secondary | ICD-10-CM

## 2022-06-17 DIAGNOSIS — G9600 Cerebrospinal fluid leak, unspecified: Secondary | ICD-10-CM | POA: Diagnosis present

## 2022-06-17 DIAGNOSIS — G9782 Other postprocedural complications and disorders of nervous system: Secondary | ICD-10-CM | POA: Diagnosis present

## 2022-06-17 HISTORY — PX: CRANIOPLASTY: SHX1407

## 2022-06-17 HISTORY — PX: PLACEMENT OF LUMBAR DRAIN: SHX6028

## 2022-06-17 LAB — CBC
HCT: 37.1 % — ABNORMAL LOW (ref 39.0–52.0)
Hemoglobin: 12.4 g/dL — ABNORMAL LOW (ref 13.0–17.0)
MCH: 33.3 pg (ref 26.0–34.0)
MCHC: 33.4 g/dL (ref 30.0–36.0)
MCV: 99.7 fL (ref 80.0–100.0)
Platelets: 102 10*3/uL — ABNORMAL LOW (ref 150–400)
RBC: 3.72 MIL/uL — ABNORMAL LOW (ref 4.22–5.81)
RDW: 13.8 % (ref 11.5–15.5)
WBC: 9.2 10*3/uL (ref 4.0–10.5)
nRBC: 0 % (ref 0.0–0.2)

## 2022-06-17 LAB — MRSA NEXT GEN BY PCR, NASAL: MRSA by PCR Next Gen: NOT DETECTED

## 2022-06-17 LAB — POCT I-STAT, CHEM 8
BUN: 78 mg/dL — ABNORMAL HIGH (ref 8–23)
Calcium, Ion: 1.16 mmol/L (ref 1.15–1.40)
Chloride: 104 mmol/L (ref 98–111)
Creatinine, Ser: 14.1 mg/dL — ABNORMAL HIGH (ref 0.61–1.24)
Glucose, Bld: 85 mg/dL (ref 70–99)
HCT: 42 % (ref 39.0–52.0)
Hemoglobin: 14.3 g/dL (ref 13.0–17.0)
Potassium: 4.5 mmol/L (ref 3.5–5.1)
Sodium: 138 mmol/L (ref 135–145)
TCO2: 24 mmol/L (ref 22–32)

## 2022-06-17 LAB — CREATININE, SERUM
Creatinine, Ser: 13.34 mg/dL — ABNORMAL HIGH (ref 0.61–1.24)
GFR, Estimated: 4 mL/min — ABNORMAL LOW (ref >60)

## 2022-06-17 SURGERY — CRANIOPLASTY
Anesthesia: General

## 2022-06-17 MED ORDER — ALLOPURINOL 300 MG PO TABS
300.0000 mg | ORAL_TABLET | Freq: Every day | ORAL | Status: DC
  Administered 2022-06-18 – 2022-06-26 (×9): 300 mg via ORAL
  Filled 2022-06-17 (×10): qty 1

## 2022-06-17 MED ORDER — MIDAZOLAM HCL 2 MG/2ML IJ SOLN
INTRAMUSCULAR | Status: AC
  Filled 2022-06-17: qty 2

## 2022-06-17 MED ORDER — FENTANYL CITRATE (PF) 250 MCG/5ML IJ SOLN
INTRAMUSCULAR | Status: AC
  Filled 2022-06-17: qty 5

## 2022-06-17 MED ORDER — ROCURONIUM BROMIDE 10 MG/ML (PF) SYRINGE
PREFILLED_SYRINGE | INTRAVENOUS | Status: AC
  Filled 2022-06-17: qty 10

## 2022-06-17 MED ORDER — DEXAMETHASONE SODIUM PHOSPHATE 10 MG/ML IJ SOLN
INTRAMUSCULAR | Status: AC
  Filled 2022-06-17: qty 1

## 2022-06-17 MED ORDER — CHLORHEXIDINE GLUCONATE CLOTH 2 % EX PADS: 6.0000 | MEDICATED_PAD | Freq: Once | CUTANEOUS | Status: DC

## 2022-06-17 MED ORDER — PROPOFOL 10 MG/ML IV BOLUS
INTRAVENOUS | Status: AC
  Filled 2022-06-17: qty 20

## 2022-06-17 MED ORDER — THROMBIN 20000 UNITS EX SOLR
CUTANEOUS | Status: AC
  Filled 2022-06-17: qty 20000

## 2022-06-17 MED ORDER — LORATADINE 10 MG PO TABS
10.0000 mg | ORAL_TABLET | Freq: Every day | ORAL | Status: DC
  Administered 2022-06-18 – 2022-06-26 (×9): 10 mg via ORAL
  Filled 2022-06-17 (×10): qty 1

## 2022-06-17 MED ORDER — ATORVASTATIN CALCIUM 40 MG PO TABS
40.0000 mg | ORAL_TABLET | Freq: Every day | ORAL | Status: DC
  Administered 2022-06-18 – 2022-06-26 (×9): 40 mg via ORAL
  Filled 2022-06-17 (×10): qty 1

## 2022-06-17 MED ORDER — BISACODYL 5 MG PO TBEC
5.0000 mg | DELAYED_RELEASE_TABLET | Freq: Every day | ORAL | Status: DC | PRN
  Administered 2022-06-19 – 2022-06-20 (×2): 5 mg via ORAL
  Filled 2022-06-17 (×2): qty 1

## 2022-06-17 MED ORDER — ONDANSETRON HCL 4 MG/2ML IJ SOLN
4.0000 mg | INTRAMUSCULAR | Status: DC | PRN
  Administered 2022-06-21 – 2022-06-23 (×5): 4 mg via INTRAVENOUS
  Filled 2022-06-17 (×5): qty 2

## 2022-06-17 MED ORDER — LACTATED RINGERS IV SOLN: INTRAVENOUS | Status: DC

## 2022-06-17 MED ORDER — CALCITRIOL 0.25 MCG PO CAPS
0.2500 ug | ORAL_CAPSULE | Freq: Every day | ORAL | Status: DC
  Administered 2022-06-18 – 2022-06-26 (×9): 0.25 ug via ORAL
  Filled 2022-06-17 (×10): qty 1

## 2022-06-17 MED ORDER — PHENYLEPHRINE HCL-NACL 20-0.9 MG/250ML-% IV SOLN
INTRAVENOUS | Status: AC
  Filled 2022-06-17: qty 250

## 2022-06-17 MED ORDER — LIDOCAINE 2% (20 MG/ML) 5 ML SYRINGE
INTRAMUSCULAR | Status: DC | PRN
  Administered 2022-06-17: 60 mg via INTRAVENOUS

## 2022-06-17 MED ORDER — PANTOPRAZOLE SODIUM 40 MG IV SOLR
40.0000 mg | Freq: Every day | INTRAVENOUS | Status: DC
  Administered 2022-06-17 – 2022-06-19 (×3): 40 mg via INTRAVENOUS
  Filled 2022-06-17 (×3): qty 10

## 2022-06-17 MED ORDER — LIDOCAINE-EPINEPHRINE 0.5 %-1:200000 IJ SOLN
INTRAMUSCULAR | Status: AC
  Filled 2022-06-17: qty 50

## 2022-06-17 MED ORDER — CEFAZOLIN SODIUM-DEXTROSE 1-4 GM/50ML-% IV SOLN
1.0000 g | INTRAVENOUS | Status: AC
  Administered 2022-06-18 – 2022-06-24 (×7): 1 g via INTRAVENOUS
  Filled 2022-06-17 (×7): qty 50

## 2022-06-17 MED ORDER — LIDOCAINE 2% (20 MG/ML) 5 ML SYRINGE
INTRAMUSCULAR | Status: AC
  Filled 2022-06-17: qty 5

## 2022-06-17 MED ORDER — FENTANYL CITRATE (PF) 100 MCG/2ML IJ SOLN: 25.0000 ug | INTRAMUSCULAR | Status: DC | PRN

## 2022-06-17 MED ORDER — FERRIC CITRATE 1 GM 210 MG(FE) PO TABS
630.0000 mg | ORAL_TABLET | Freq: Three times a day (TID) | ORAL | Status: DC
  Administered 2022-06-18 – 2022-06-26 (×21): 630 mg via ORAL
  Filled 2022-06-17 (×20): qty 3

## 2022-06-17 MED ORDER — THROMBIN 20000 UNITS EX SOLR: CUTANEOUS | Status: DC | PRN

## 2022-06-17 MED ORDER — ORAL CARE MOUTH RINSE: 15.0000 mL | OROMUCOSAL | Status: DC | PRN

## 2022-06-17 MED ORDER — NALOXONE HCL 0.4 MG/ML IJ SOLN: 0.0800 mg | INTRAMUSCULAR | Status: DC | PRN

## 2022-06-17 MED ORDER — TENAPANOR HCL (CKD) 30 MG PO TABS: 30.0000 mg | ORAL_TABLET | ORAL | Status: DC

## 2022-06-17 MED ORDER — HEPARIN SODIUM (PORCINE) 5000 UNIT/ML IJ SOLN
5000.0000 [IU] | Freq: Three times a day (TID) | INTRAMUSCULAR | Status: DC
  Filled 2022-06-17: qty 1

## 2022-06-17 MED ORDER — POTASSIUM CHLORIDE IN NACL 20-0.9 MEQ/L-% IV SOLN
INTRAVENOUS | Status: DC
  Filled 2022-06-17 (×4): qty 1000

## 2022-06-17 MED ORDER — MORPHINE SULFATE (PF) 2 MG/ML IV SOLN
1.0000 mg | INTRAVENOUS | Status: DC | PRN
  Administered 2022-06-19 (×2): 1 mg via INTRAVENOUS
  Filled 2022-06-17 (×2): qty 1

## 2022-06-17 MED ORDER — PROPOFOL 10 MG/ML IV BOLUS
INTRAVENOUS | Status: DC | PRN
  Administered 2022-06-17: 20 mg via INTRAVENOUS
  Administered 2022-06-17: 100 ug/kg/min via INTRAVENOUS
  Administered 2022-06-17: 30 mg via INTRAVENOUS
  Administered 2022-06-17: 160 mg via INTRAVENOUS
  Administered 2022-06-17: 50 mg via INTRAVENOUS

## 2022-06-17 MED ORDER — ACETAMINOPHEN 325 MG PO TABS
650.0000 mg | ORAL_TABLET | ORAL | Status: DC | PRN
  Administered 2022-06-17 – 2022-06-24 (×8): 650 mg via ORAL
  Filled 2022-06-17 (×8): qty 2

## 2022-06-17 MED ORDER — SUGAMMADEX SODIUM 200 MG/2ML IV SOLN
INTRAVENOUS | Status: DC | PRN
  Administered 2022-06-17: 100 mg via INTRAVENOUS
  Administered 2022-06-17: 200 mg via INTRAVENOUS

## 2022-06-17 MED ORDER — ACETAMINOPHEN 10 MG/ML IV SOLN: 1000.0000 mg | Freq: Once | INTRAVENOUS | Status: DC | PRN

## 2022-06-17 MED ORDER — GENTAMICIN SULFATE 0.1 % EX CREA
1.0000 | TOPICAL_CREAM | Freq: Every day | CUTANEOUS | Status: DC
  Administered 2022-06-18 – 2022-06-22 (×4): 1 via TOPICAL
  Filled 2022-06-17: qty 15

## 2022-06-17 MED ORDER — ROCURONIUM BROMIDE 10 MG/ML (PF) SYRINGE
PREFILLED_SYRINGE | INTRAVENOUS | Status: DC | PRN
  Administered 2022-06-17: 30 mg via INTRAVENOUS
  Administered 2022-06-17: 70 mg via INTRAVENOUS
  Administered 2022-06-17: 20 mg via INTRAVENOUS

## 2022-06-17 MED ORDER — OXYCODONE HCL 5 MG/5ML PO SOLN: 5.0000 mg | Freq: Once | ORAL | Status: DC | PRN

## 2022-06-17 MED ORDER — CHLORHEXIDINE GLUCONATE CLOTH 2 % EX PADS
6.0000 | MEDICATED_PAD | Freq: Every day | CUTANEOUS | Status: DC
  Administered 2022-06-17 – 2022-06-26 (×10): 6 via TOPICAL

## 2022-06-17 MED ORDER — ONDANSETRON HCL 4 MG/2ML IJ SOLN
INTRAMUSCULAR | Status: AC
  Filled 2022-06-17: qty 2

## 2022-06-17 MED ORDER — TORSEMIDE 100 MG PO TABS
100.0000 mg | ORAL_TABLET | Freq: Every day | ORAL | Status: DC
  Administered 2022-06-17 – 2022-06-26 (×10): 100 mg via ORAL
  Filled 2022-06-17 (×10): qty 1

## 2022-06-17 MED ORDER — CEFAZOLIN SODIUM-DEXTROSE 2-4 GM/100ML-% IV SOLN
2.0000 g | INTRAVENOUS | Status: AC
  Administered 2022-06-17: 2 g via INTRAVENOUS
  Filled 2022-06-17: qty 100

## 2022-06-17 MED ORDER — ACETAMINOPHEN 650 MG RE SUPP: 650.0000 mg | RECTAL | Status: DC | PRN

## 2022-06-17 MED ORDER — MAGNESIUM CITRATE PO SOLN: 1.0000 | Freq: Once | ORAL | Status: DC | PRN

## 2022-06-17 MED ORDER — PHENYLEPHRINE HCL-NACL 20-0.9 MG/250ML-% IV SOLN
INTRAVENOUS | Status: DC | PRN
  Administered 2022-06-17: 50 ug/min via INTRAVENOUS

## 2022-06-17 MED ORDER — HEPARIN SODIUM (PORCINE) 5000 UNIT/ML IJ SOLN
5000.0000 [IU] | Freq: Three times a day (TID) | INTRAMUSCULAR | Status: DC
  Administered 2022-06-24 – 2022-06-25 (×3): 5000 [IU] via SUBCUTANEOUS
  Filled 2022-06-17 (×4): qty 1

## 2022-06-17 MED ORDER — HYDROCODONE-ACETAMINOPHEN 5-325 MG PO TABS
1.0000 | ORAL_TABLET | ORAL | Status: DC | PRN
  Administered 2022-06-17 – 2022-06-24 (×9): 1 via ORAL
  Filled 2022-06-17 (×9): qty 1

## 2022-06-17 MED ORDER — 0.9 % SODIUM CHLORIDE (POUR BTL) OPTIME
TOPICAL | Status: DC | PRN
  Administered 2022-06-17 (×2): 1000 mL

## 2022-06-17 MED ORDER — BACITRACIN ZINC 500 UNIT/GM EX OINT
TOPICAL_OINTMENT | CUTANEOUS | Status: AC
  Filled 2022-06-17: qty 28.35

## 2022-06-17 MED ORDER — LABETALOL HCL 100 MG PO TABS
100.0000 mg | ORAL_TABLET | Freq: Two times a day (BID) | ORAL | Status: DC
  Administered 2022-06-18 – 2022-06-26 (×13): 100 mg via ORAL
  Filled 2022-06-17 (×16): qty 1

## 2022-06-17 MED ORDER — ONDANSETRON HCL 4 MG/2ML IJ SOLN
INTRAMUSCULAR | Status: DC | PRN
  Administered 2022-06-17: 4 mg via INTRAVENOUS

## 2022-06-17 MED ORDER — DELFLEX-LC/1.5% DEXTROSE 344 MOSM/L IP SOLN: INTRAPERITONEAL | Status: DC

## 2022-06-17 MED ORDER — CHLORHEXIDINE GLUCONATE 0.12 % MT SOLN
15.0000 mL | Freq: Once | OROMUCOSAL | Status: AC
  Administered 2022-06-17: 15 mL via OROMUCOSAL
  Filled 2022-06-17: qty 15

## 2022-06-17 MED ORDER — ACETAMINOPHEN 500 MG PO TABS: 1000.0000 mg | ORAL_TABLET | Freq: Once | ORAL | Status: DC | PRN

## 2022-06-17 MED ORDER — OXYCODONE HCL 5 MG PO TABS: 5.0000 mg | ORAL_TABLET | Freq: Once | ORAL | Status: DC | PRN

## 2022-06-17 MED ORDER — PHENYLEPHRINE 80 MCG/ML (10ML) SYRINGE FOR IV PUSH (FOR BLOOD PRESSURE SUPPORT)
PREFILLED_SYRINGE | INTRAVENOUS | Status: DC | PRN
  Administered 2022-06-17: 40 ug via INTRAVENOUS
  Administered 2022-06-17: 80 ug via INTRAVENOUS
  Administered 2022-06-17: 160 ug via INTRAVENOUS
  Administered 2022-06-17 (×3): 80 ug via INTRAVENOUS
  Administered 2022-06-17: 160 ug via INTRAVENOUS

## 2022-06-17 MED ORDER — LABETALOL HCL 5 MG/ML IV SOLN: 10.0000 mg | INTRAVENOUS | Status: DC | PRN

## 2022-06-17 MED ORDER — GABAPENTIN 100 MG PO CAPS
100.0000 mg | ORAL_CAPSULE | Freq: Every day | ORAL | Status: DC
  Administered 2022-06-17 – 2022-06-25 (×8): 100 mg via ORAL
  Filled 2022-06-17 (×8): qty 1

## 2022-06-17 MED ORDER — GENTAMICIN SULFATE 0.1 % EX CREA
1.0000 | TOPICAL_CREAM | Freq: Every day | CUTANEOUS | Status: DC
Start: 2022-06-17 — End: 2022-06-17

## 2022-06-17 MED ORDER — PROMETHAZINE HCL 25 MG PO TABS: 12.5000 mg | ORAL_TABLET | ORAL | Status: DC | PRN

## 2022-06-17 MED ORDER — HEMOSTATIC AGENTS (NO CHARGE) OPTIME
TOPICAL | Status: DC | PRN
  Administered 2022-06-17: 1

## 2022-06-17 MED ORDER — PROPOFOL 1000 MG/100ML IV EMUL
INTRAVENOUS | Status: AC
  Filled 2022-06-17: qty 100

## 2022-06-17 MED ORDER — ONDANSETRON HCL 4 MG PO TABS
4.0000 mg | ORAL_TABLET | ORAL | Status: DC | PRN
  Administered 2022-06-22 – 2022-06-24 (×4): 4 mg via ORAL
  Filled 2022-06-17 (×3): qty 1

## 2022-06-17 MED ORDER — ACETAMINOPHEN 160 MG/5ML PO SOLN: 1000.0000 mg | Freq: Once | ORAL | Status: DC | PRN

## 2022-06-17 MED ORDER — FENTANYL CITRATE (PF) 250 MCG/5ML IJ SOLN
INTRAMUSCULAR | Status: DC | PRN
  Administered 2022-06-17 (×2): 100 ug via INTRAVENOUS
  Administered 2022-06-17: 25 ug via INTRAVENOUS

## 2022-06-17 MED ORDER — DEXAMETHASONE SODIUM PHOSPHATE 10 MG/ML IJ SOLN
INTRAMUSCULAR | Status: DC | PRN
  Administered 2022-06-17: 10 mg via INTRAVENOUS

## 2022-06-17 MED ORDER — ORAL CARE MOUTH RINSE: 15.0000 mL | Freq: Once | OROMUCOSAL | Status: AC

## 2022-06-17 MED ORDER — CLEVIDIPINE BUTYRATE 0.5 MG/ML IV EMUL
INTRAVENOUS | Status: DC | PRN
  Administered 2022-06-17 (×2): 2 mg/h via INTRAVENOUS

## 2022-06-17 SURGICAL SUPPLY — 63 items
ADH SKN CLS APL DERMABOND .7 (GAUZE/BANDAGES/DRESSINGS)
BAG COUNTER SPONGE SURGICOUNT (BAG) ×2 IMPLANT
BAG DRN CSF CATH SYS STRL MNTR (MISCELLANEOUS) ×1
BAG SPNG CNTER NS LX DISP (BAG) ×2
BLADE CLIPPER SURG (BLADE) IMPLANT
BNDG CMPR 75X41 PLY HI ABS (GAUZE/BANDAGES/DRESSINGS) ×1
BNDG GAUZE DERMACEA FLUFF 4 (GAUZE/BANDAGES/DRESSINGS) IMPLANT
BNDG GZE DERMACEA 4 6PLY (GAUZE/BANDAGES/DRESSINGS) ×2
BNDG STRETCH 4X75 STRL LF (GAUZE/BANDAGES/DRESSINGS) IMPLANT
CANISTER SUCT 3000ML PPV (MISCELLANEOUS) ×1 IMPLANT
CATH LUMBAR HERMETIC 14G (CATHETERS) IMPLANT
CATHETER LUMBAR HERMETIC 14G (CATHETERS) ×1
CLIP RANEY DISP (INSTRUMENTS) IMPLANT
COVER BACK TABLE 60X90IN (DRAPES) IMPLANT
COVER BURR HOLE 7 (Orthopedic Implant) IMPLANT
DERMABOND ADVANCED .7 DNX12 (GAUZE/BANDAGES/DRESSINGS) IMPLANT
DRAIN SUBARACHNOID (WOUND CARE) IMPLANT
DRAPE HALF SHEET 40X57 (DRAPES) IMPLANT
DRAPE NEUROLOGICAL W/INCISE (DRAPES) ×1 IMPLANT
DRAPE WARM FLUID 44X44 (DRAPES) ×1 IMPLANT
DURAPREP 6ML APPLICATOR 50/CS (WOUND CARE) ×2 IMPLANT
DURASEAL SPINE SEALANT 5 POLY (MISCELLANEOUS) IMPLANT
ELECT REM PT RETURN 9FT ADLT (ELECTROSURGICAL) ×1
ELECTRODE REM PT RTRN 9FT ADLT (ELECTROSURGICAL) ×1 IMPLANT
GAUZE 4X4 16PLY ~~LOC~~+RFID DBL (SPONGE) ×1 IMPLANT
GAUZE SPONGE 4X4 12PLY STRL (GAUZE/BANDAGES/DRESSINGS) ×1 IMPLANT
GLOVE ECLIPSE 6.5 STRL STRAW (GLOVE) ×2 IMPLANT
GLOVE EXAM NITRILE XL STR (GLOVE) IMPLANT
GOWN STRL REUS W/ TWL LRG LVL3 (GOWN DISPOSABLE) ×3 IMPLANT
GOWN STRL REUS W/ TWL XL LVL3 (GOWN DISPOSABLE) IMPLANT
GOWN STRL REUS W/TWL 2XL LVL3 (GOWN DISPOSABLE) IMPLANT
GOWN STRL REUS W/TWL LRG LVL3 (GOWN DISPOSABLE) ×3
GOWN STRL REUS W/TWL XL LVL3 (GOWN DISPOSABLE)
GRAFT DURAGEN MATRIX 3WX3L (Graft) ×1 IMPLANT
GRAFT DURAGEN MATRIX 3X3 SNGL (Graft) IMPLANT
HEMOSTAT SURGICEL 2X14 (HEMOSTASIS) ×1 IMPLANT
KIT BASIN OR (CUSTOM PROCEDURE TRAY) ×1 IMPLANT
KIT TURNOVER KIT B (KITS) ×1 IMPLANT
NDL HYPO 25X1 1.5 SAFETY (NEEDLE) ×1 IMPLANT
NEEDLE HYPO 25X1 1.5 SAFETY (NEEDLE) ×1 IMPLANT
NS IRRIG 1000ML POUR BTL (IV SOLUTION) ×1 IMPLANT
PACK BATTERY CMF DISP FOR DVR (ORTHOPEDIC DISPOSABLE SUPPLIES) IMPLANT
PACK CRANIOTOMY CUSTOM (CUSTOM PROCEDURE TRAY) ×1 IMPLANT
PAD ARMBOARD 7.5X6 YLW CONV (MISCELLANEOUS) ×3 IMPLANT
PIN MAYFIELD SKULL DISP (PIN) IMPLANT
PLATE CRANIAL CMF UNIV (Plate) IMPLANT
SCREW UNIII AXS SD 1.5X4 (Screw) IMPLANT
SET CRAINOPLASTY (SET/KITS/TRAYS/PACK) ×1 IMPLANT
SOL ELECTROSURG ANTI STICK (MISCELLANEOUS)
SOLUTION ELECTROSURG ANTI STCK (MISCELLANEOUS) ×1 IMPLANT
SPIKE FLUID TRANSFER (MISCELLANEOUS) ×1 IMPLANT
SPONGE SURGIFOAM ABS GEL 100 (HEMOSTASIS) IMPLANT
STAPLER SKIN PROX WIDE 3.9 (STAPLE) ×1 IMPLANT
STOCKINETTE STERILE 6X72 (MISCELLANEOUS) IMPLANT
SUT ETHILON 3 0 FSL (SUTURE) IMPLANT
SUT NURALON 4 0 TR CR/8 (SUTURE) IMPLANT
SUT VIC AB 2-0 CT2 18 VCP726D (SUTURE) ×1 IMPLANT
SYSTEM CSF EXTERNAL DRAINAGE (MISCELLANEOUS) IMPLANT
TOWEL GREEN STERILE (TOWEL DISPOSABLE) ×3 IMPLANT
TOWEL GREEN STERILE FF (TOWEL DISPOSABLE) ×1 IMPLANT
TRAY FOLEY MTR SLVR 16FR STAT (SET/KITS/TRAYS/PACK) IMPLANT
UNDERPAD 30X36 HEAVY ABSORB (UNDERPADS AND DIAPERS) IMPLANT
WATER STERILE IRR 1000ML POUR (IV SOLUTION) ×1 IMPLANT

## 2022-06-17 NOTE — H&P (Signed)
BP 119/81   Pulse 85   Temp 97.9 F (36.6 C) (Oral)   Resp 16   Ht  (1.727 m)   Wt 89 kg   SpO2 98%   BMI 29.83 kg/m  Daniel Solomon is a 62 y.o. male With a csf leak post op a cranioplasty. Admitted today for a repair. No Known Allergies Past Medical History:  Diagnosis Date   Anemia    Anemia in chronic kidney disease 03/20/2020   Bicuspid aortic valve    Blood transfusion without reported diagnosis    CAD (coronary artery disease)    proximal LAD 50%, mid LAD 50-60%, mid to distal LAD 99%; mid circumflex 20%; proximal RCA 20%, distal RCA 50%; PDA 60-70%, proximal posterior AV groove 50%, proximal posterior lateral 50%.  There was a question of possible LVOT gradient;  s/p Resolute DES to mid-distal LAD 10/2010;  echocardiogram 11/17/10: EF 55-60%, mild LVH, grade 1 diastolic dysfunction, normal aortic valve, mild MR, PASP 32.    Chicken pox    Dysrhythmia    A. Fib   Enlarged aorta    ESRD on peritoneal dialysis    Glaucoma    Gout    allopurinol , colchicine prn in past, has not had flares   Headache    History of kidney stones    Hypercholesterolemia    With hypertriglyceridemia   Hypertension    Mild aortic stenosis    PAF (paroxysmal atrial fibrillation)    a. Documented on event monitor 01/2016.   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   SDH (subdural hematoma) 08/2021   left   Sleep apnea    cpap nightly   Past Surgical History:  Procedure Laterality Date   COLONOSCOPY  2018   CORONARY STENT PLACEMENT  02/28/2010   CRANIOPLASTY N/A 02/25/2022   Procedure: Cranioplasty with custom plate;  Surgeon: Coletta Memos, MD;  Location: [facility] OR;  Service: Neurosurgery;  Laterality: N/A;   CRANIOTOMY Left 10/05/2021   Procedure: CRANIOTOMY HEMATOMA EVACUATION SUBDURAL;  Surgeon: Coletta Memos, MD;  [facility];   Service: Neurosurgery;  Laterality: Left;   CRANIOTOMY Left 10/07/2021   Procedure: Craniectomy for Recurrent  Intracranial Hematoma;  Surgeon: Coletta Memos, MD;  Location: [facility] OR;  Service: Neurosurgery;  Laterality: Left;   CYSTOSCOPY/RETROGRADE/URETEROSCOPY  03/26/2012   Procedure: CYSTOSCOPY/RETROGRADE/URETEROSCOPY;  Surgeon: Garnett Farm, MD;  [facility];  Service: Urology;  Laterality: Bilateral;  CYSTOSCOPY BILATERAL RETROGRADE PYELOGRAM AND POSSIBLE URETEROSCOPY   HERNIA REPAIR  2021   Umbilical Repair   INTRAOPERATIVE TRANSTHORACIC ECHOCARDIOGRAM N/A 01/19/2021   Procedure: INTRAOPERATIVE TRANSTHORACIC ECHOCARDIOGRAM;  Surgeon: Kathleene Hazel, MD;  Location: [facility] OR;  Service: Open Heart Surgery;  Laterality: N/A;   IR ANGIO EXTERNAL CAROTID SEL EXT CAROTID UNI L MOD SED  09/28/2021   IR ANGIO INTRA EXTRACRAN SEL INTERNAL CAROTID UNI L MOD SED  09/28/2021   IR ANGIOGRAM FOLLOW UP STUDY  09/28/2021   IR ANGIOGRAM FOLLOW UP STUDY  09/30/2021   IR NEURO EACH ADD'L AFTER BASIC UNI LEFT (MS)  09/28/2021   IR NEURO EACH ADD'L AFTER BASIC UNI RIGHT (MS)  09/30/2021   IR TRANSCATH/EMBOLIZ  09/28/2021   RADIOLOGY WITH ANESTHESIA N/A 09/28/2021   Procedure: Middle meningeal artery embolization;  Surgeon: Lisbeth Renshaw, MD;  Location: [facility] OR;  Service: Radiology;  Laterality: N/A;   RIGHT HEART CATH AND CORONARY ANGIOGRAPHY N/A 12/04/2020   Procedure: RIGHT HEART CATH AND CORONARY ANGIOGRAPHY;  Surgeon: Kathleene Hazel, MD;  [facility];  Service: Cardiovascular;  Laterality: N/A;   TRANSCATHETER AORTIC VALVE REPLACEMENT, TRANSFEMORAL N/A 01/19/2021   Procedure: TRANSCATHETER AORTIC VALVE REPLACEMENT, TRANSFEMORAL USING A EDWARDS VALVE.;  Surgeon: Kathleene Hazel, MD;  [facility];  Service: Open Heart Surgery;  Laterality: N/A;   Social History   Socioeconomic History   Marital status: Married    Spouse name:  Not on file   Number of children: 4   Years of education: Not on file   Highest education level: Not on file  Occupational History   Occupation: Research scientist (physical sciences): [employer]  Tobacco Use   Smoking status: Never   Smokeless tobacco: Never  Vaping Use   Vaping Use: Never used  Substance and Sexual Activity   Alcohol use: No   Drug use: No   Sexual activity: Yes  Other Topics Concern   Not on file  Social History Narrative   Family: Married, 4 children, 9 grandkids      Work: new job Engineer, 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)   BS at [employer]: reading, church activities- [employer]   Social Determinants of Health   Financial Resource Strain: Not on file  Food Insecurity: Not on file  Transportation Needs: Not on file  Physical Activity: Not on file  Stress: Not on file  Social Connections: Not on file  Intimate Partner Violence: Not on file   Family History  Problem Relation Age of Onset   Hypertension Father    CAD Father        CABG 42s   Kidney disease Father        Stage IV   Valvular heart disease Mother        AVR July 2015   Diabetes Paternal Grandmother    Colon cancer Neg Hx    Physical Exam Constitutional:      Appearance: Normal appearance. He is normal weight.  HENT:     Head: Normocephalic.     Nose: Nose normal.     Mouth/Throat:     Mouth: Mucous membranes are moist.     Pharynx: Oropharynx is clear.  Eyes:     Extraocular Movements: Extraocular movements intact.     Conjunctiva/sclera: Conjunctivae normal.     Pupils: Pupils are equal, round, and reactive to light.  Cardiovascular:     Rate and Rhythm: Normal rate and regular rhythm.  Pulmonary:     Effort: Pulmonary effort is normal.     Breath sounds: Normal breath sounds.  Abdominal:     General: Abdomen is flat. Bowel sounds are normal.  Musculoskeletal:        General: Normal range of motion.      Cervical back: Normal range of motion.  Skin:    General: Skin is warm and dry.  Neurological:     General: No focal deficit present.     Mental Status: He is alert and oriented to person, place, and time.     Cranial Nerves: No cranial nerve deficit.     Sensory: No sensory deficit.     Motor: No weakness.     Coordination: Coordination normal.     Gait:  Gait normal.     Deep Tendon Reflexes: Reflexes normal.  Psychiatric:        Mood and Affect: Mood normal.        Behavior: Behavior normal.        Thought Content: Thought content normal.        Judgment: Judgment normal.    Admit for cranioplasty, lumbar drain placement

## 2022-06-17 NOTE — Progress Notes (Signed)
Per patient's wife, during last admission arterial line was placed in right arm and "was left in too long."  Right arm was swollen and patient had hard time moving extremities for a period of time.  RN unsure if patient is unwilling to move right arm or unable to at times.  Neurosurgery notified.  Arterial line removed but RN did not notice much improvement.  Neurosurgery came to bedside to assess patient.  No new orders at this time.

## 2022-06-17 NOTE — Progress Notes (Signed)
PHARMACY NOTE:  ANTIMICROBIAL RENAL DOSAGE ADJUSTMENT  Current antimicrobial regimen includes a mismatch between antimicrobial dosage and estimated renal function.  As per policy approved by the Pharmacy & Therapeutics and Medical Executive Committees, the antimicrobial dosage will be adjusted accordingly.  Current antimicrobial dosage:  Cefazolin 1gm IV q8h x 7 days  Indication: post surgical prophylaxis in pt with drain in place  Renal Function:  Estimated Creatinine Clearance: 6 mL/min (A) (by C-G formula based on SCr of 14.1 mg/dL (H)).      Pt on peritoneal HD    Antimicrobial dosage has been changed to:  Cefazolin 1gm IV q24h x 7 days  Thank you for allowing pharmacy to be a part of this patient's care.  Christoper Fabian, PharmD, BCPS Please see amion for complete clinical pharmacist phone list 06/17/2022 3:38 PM

## 2022-06-17 NOTE — Transfer of Care (Signed)
Immediate Anesthesia Transfer of Care Note  Patient: Daniel Solomon  Procedure(s) Performed: REVISION OF CRANIOPLASTY/PLACEMENT OF LUMBAR DRAIN PLACEMENT OF LUMBAR DRAIN  Patient Location: PACU  Anesthesia Type:General  Level of Consciousness: drowsy and patient cooperative  Airway & Oxygen Therapy: Patient Spontanous Breathing and Patient connected to nasal cannula oxygen  Post-op Assessment: Report given to RN, Post -op Vital signs reviewed and stable, Patient moving all extremities X 4, and Patient able to stick tongue midline  Post vital signs: Reviewed and stable  Last Vitals:  Vitals Value Taken Time  BP 114/63 06/17/22 1208  Temp    Pulse 79 06/17/22 1212  Resp 19 06/17/22 1212  SpO2 95 % 06/17/22 1212  Vitals shown include unvalidated device data.  Last Pain:  Vitals:   06/17/22 0629  TempSrc:   PainSc: 0-No pain      Patients Stated Pain Goal: 0 (06/17/22 1610)  Complications:  Encounter Notable Events  Notable Event Outcome Phase Comment  Difficult to intubate - unexpected  Intraprocedure Filed from anesthesia note documentation.

## 2022-06-17 NOTE — Progress Notes (Addendum)
Called for patient in 4N15. ESRD on CCPD. S/p revision of cranioplasty/placement of lumbar drain today for CSF leak. Outpatient rx: 5 cycles Cycler fill 3000 Avg swell 1.5hrs Day exchange: 1, 2500cc fill  Will order PD for tonight, will use all 1.5% bags especially given recent surgery today. Can hold on day dwell for now. Full consult to follow in AM. Please call with any questions/concerns in the interim.  Anthony Sar, MD [facility]

## 2022-06-17 NOTE — Anesthesia Procedure Notes (Addendum)
Procedure Name: Intubation Date/Time: 06/17/2022 9:06 AM  Performed by: Darryl Nestle, CRNAPre-anesthesia Checklist: Patient identified, Emergency Drugs available, Suction available and Patient being monitored Patient Re-evaluated:Patient Re-evaluated prior to induction Oxygen Delivery Method: Circle system utilized Preoxygenation: Pre-oxygenation with 100% oxygen Induction Type: IV induction Ventilation: Mask ventilation without difficulty Laryngoscope Size: Mac and 4 Grade View: Grade II Tube type: Oral Tube size: 7.5 mm Number of attempts: 1 Airway Equipment and Method: Stylet and Bite block Placement Confirmation: ETT inserted through vocal cords under direct vision, positive ETCO2 and breath sounds checked- equal and bilateral Secured at: 21 cm Tube secured with: Tape Dental Injury: Teeth and Oropharynx as per pre-operative assessment  Difficulty Due To: Difficulty was unanticipated Comments: Performed by Natividad Brood SRNA

## 2022-06-17 NOTE — Op Note (Signed)
06/17/2022  12:08 PM  PATIENT:  Daniel Solomon  62 y.o. male  PRE-OPERATIVE DIAGNOSIS:  Skull defect, csf leak  POST-OPERATIVE DIAGNOSIS:  Skull defect, csf leak  PROCEDURE:  Procedure(s): REVISION OF CRANIOPLASTY/PLACEMENT OF LUMBAR DRAIN PLACEMENT OF LUMBAR DRAIN  SURGEON: Surgeon(s): Coletta Memos, MD  ASSISTANTS:none  ANESTHESIA:   general  EBL:  Total I/O In: 1100 [I.V.:1000; IV Piggyback:100] Out: 320 [Urine:220; Blood:100]  BLOOD ADMINISTERED:none  COUNT:per nursingf  DRAINS:  Lumbar drain    SPECIMEN:  No Specimen  DICTATION: Daniel Solomon was taken to the operating room, intubated, and placed under a general anesthetic without difficulty. On the OR stretcher I had him placed on his left side. I prepped his lumbar region and under sterile conditions placed a lumbar drain. The drain was connected to a drainage system and left closed.  He was positioned supine on the bed with his head supported by a horseshoe head rest. His head was shaved, was prepped and was draped in a sterile manner. I opened the scalp with a 10 blade following the line of the existing wound. I reflected the scalp and temporalis muscle rostrally with scalp hooks. There was immediate gush of clear warm fluid from underneath the scalp. I did place Raney clips on the scalp edges. There were multiple loose screws and plates. I removed the skull side screws, and three loose screws that had come out completely. I removed the cranioplasty and exposed the brain. There was no obvious leak, and most of the cerebrum appeared to be sealed in. I did place a piece of duragen over brain where the covering layer appeared very thin. I replaced the cranioplasty with some new plates and screws where they had been loose. Moving them to places where the screw holes were not present.  I closed the scalp approximating the galea and temporalis with vicryls, then the scalp edges with a nylon. I applied a sterile dressing and he  was extubated. He was brought to the pacu moving all extremities and following commands.  PLAN OF CARE: Admit to inpatient   PATIENT DISPOSITION:  PACU - hemodynamically stable.   Delay start of Pharmacological VTE agent (>24hrs) due to surgical blood loss or risk of bleeding:  yes

## 2022-06-17 NOTE — Anesthesia Procedure Notes (Signed)
Arterial Line Insertion Start/End4/19/2024 9:11 AM, 06/17/2022 9:15 AM Performed by: Val Eagle, MD, attending  Patient location: Pre-op. Preanesthetic checklist: patient identified, IV checked, site marked, risks and benefits discussed, surgical consent, monitors and equipment checked, pre-op evaluation, timeout performed and anesthesia consent Lidocaine 1% used for infiltration Right, radial was placed Catheter size: 20 G Hand hygiene performed  and maximum sterile barriers used   Attempts: 1 Procedure performed using ultrasound guided technique. Ultrasound Notes:anatomy identified, needle tip was noted to be adjacent to the nerve/plexus identified and no ultrasound evidence of intravascular and/or intraneural injection Following insertion, dressing applied and Biopatch. Post procedure assessment: normal and unchanged  Post procedure complications: unsuccessful attempts. Patient tolerated the procedure well with no immediate complications. Additional procedure comments: CRNA unsuccessful x2 on L wrist, MDA successful attempt X1 R wrist.

## 2022-06-17 NOTE — Progress Notes (Signed)
PD tx initation note  Pre TX VS:see table below   06/17/22 2030  Vitals  BP 117/81  MAP (mmHg) 93  BP Location Right Arm  BP Method Automatic  Patient Position (if appropriate) Sitting  Pulse Rate 74  Pulse Rate Source Monitor  ECG Heart Rate 72  Resp 12  Level of Consciousness  Level of Consciousness Alert  MEWS COLOR  MEWS Score Color Green  Oxygen Therapy  SpO2 94 %  O2 Device Room Air  MEWS Score  MEWS Temp 0  MEWS Systolic 0  MEWS Pulse 0  MEWS RR 1  MEWS LOC 0  MEWS Score 1     Pre TX weight:unable to get weight on patient. Sitting on recliner and in pain..   PD treatment initiated via aseptic technique. Consent signed and in chart. Patient is alert and oriented. complaints of pain, meds administered by primary RN.  No specimen collected. PD exit site clean, dry and intact. Gentamycin and new dressing applied. Bedside RN educated on PD machine and how to contact tech support when PD machine alarms.  Hulen Shouts RN [facility]

## 2022-06-18 ENCOUNTER — Inpatient Hospital Stay (HOSPITAL_COMMUNITY): Payer: Managed Care, Other (non HMO)

## 2022-06-18 MED ORDER — LEVETIRACETAM 500 MG PO TABS: 1000.0000 mg | ORAL_TABLET | Freq: Once | ORAL | Status: DC

## 2022-06-18 MED ORDER — DELFLEX-LC/1.5% DEXTROSE 344 MOSM/L IP SOLN: INTRAPERITONEAL | Status: DC

## 2022-06-18 MED ORDER — LORAZEPAM 2 MG/ML IJ SOLN
INTRAMUSCULAR | Status: AC
  Administered 2022-06-18: 2 mg
  Filled 2022-06-18: qty 1

## 2022-06-18 MED ORDER — LEVETIRACETAM IN NACL 1000 MG/100ML IV SOLN
1000.0000 mg | Freq: Once | INTRAVENOUS | Status: AC
  Administered 2022-06-18: 1000 mg via INTRAVENOUS
  Filled 2022-06-18: qty 100

## 2022-06-18 MED ORDER — LEVETIRACETAM IN NACL 500 MG/100ML IV SOLN
500.0000 mg | Freq: Two times a day (BID) | INTRAVENOUS | Status: DC
  Administered 2022-06-19 – 2022-06-22 (×7): 500 mg via INTRAVENOUS
  Filled 2022-06-18 (×7): qty 100

## 2022-06-18 NOTE — Consult Note (Signed)
ESRD Consult Note  Assessment/Recommendations:   ESRD: -on CCPD -Outpatient rx: [facility]/Dr. Marisue Humble 5 cycles Cycler fill 3000 Avg swell 1.5hrs Day exchange: 1, 2500cc fill -PD tonight, 1.5% bags, will hold on day dwells while he's here -recommend bowel regimen -Preferred narcotic agents for pain control are hydromorphone, fentanyl, and methadone. Morphine should not be used. Avoid Baclofen and avoid oral sodium phosphate and magnesium citrate based laxatives / bowel preps.  Cranioplasty, placement of lumbar drain 4/19 -Per neurosurgery  Volume/ hypertension:  -soft BP's -utilizing 1.5% bags -euvolemic  Anemia of Chronic Kidney Disease: -hgb acceptable  Secondary Hyperparathyroidism/Hyperphosphatemia:   -Resume home binders and calcitriol  Anthony Sar, MD [facility]  History of Present Illness: Daniel Solomon is a/an 62 y.o. male with a past medical history of ESRD, SDH, CAD, hypertension, PAF, history of TAVR who presents with CSF leak, requiring lumbar drain and cranioplasty which she underwent on 4/20.  He did undergo PD last night with a 1.5% bags and tolerated.  He is currently drowsy therefore his lumbar drain is currently clamped.  Patient seen and examined at bedside, wife at bedside.  Did well with breakfast but has not eaten anything since.  Has not had a bowel movement since has been here.  ROS unobtainable.   Medications:  Current Facility-Administered Medications  Medication Dose Route Frequency Provider Last Rate Last Admin   0.9 % NaCl with KCl 20 mEq/ L  infusion   Intravenous Continuous Coletta Memos, MD   Stopped at 06/18/22 1239   acetaminophen (TYLENOL) tablet 650 mg  650 mg Oral Q4H PRN Coletta Memos, MD   650 mg at 06/17/22 2018   Or   acetaminophen (TYLENOL) suppository 650 mg  650 mg Rectal Q4H PRN Coletta Memos, MD       allopurinol (ZYLOPRIM) tablet 300 mg  300 mg Oral Daily Coletta Memos, MD   300 mg at 06/18/22 0946   atorvastatin  (LIPITOR) tablet 40 mg  40 mg Oral Daily Coletta Memos, MD   40 mg at 06/18/22 0946   bisacodyl (DULCOLAX) EC tablet 5 mg  5 mg Oral Daily PRN Coletta Memos, MD       calcitRIOL (ROCALTROL) capsule 0.25 mcg  0.25 mcg Oral Daily Coletta Memos, MD   0.25 mcg at 06/18/22 0947   ceFAZolin (ANCEF) IVPB 1 g/50 mL premix  1 g Intravenous Q24H Coletta Memos, MD   Stopped at 06/18/22 1033   Chlorhexidine Gluconate Cloth 2 % PADS 6 each  6 each Topical Daily Coletta Memos, MD   6 each at 06/17/22 1541   dialysis solution 1.5% low-MG/low-CA dianeal solution   Intraperitoneal Q24H Delano Metz, MD       ferric citrate (AURYXIA) tablet 630 mg  630 mg Oral TID WC Coletta Memos, MD   630 mg at 06/18/22 1213   gabapentin (NEURONTIN) capsule 100 mg  100 mg Oral QHS Coletta Memos, MD   100 mg at 06/17/22 2119   gentamicin cream (GARAMYCIN) 0.1 % 1 Application  1 Application Topical Daily Coletta Memos, MD   1 Application at 06/18/22 0948   heparin injection 5,000 Units  5,000 Units Subcutaneous Q8H Coletta Memos, MD       HYDROcodone-acetaminophen (NORCO/VICODIN) 5-325 MG per tablet 1 tablet  1 tablet Oral Q4H PRN Coletta Memos, MD   1 tablet at 06/18/22 0953   labetalol (NORMODYNE) injection 10-40 mg  10-40 mg Intravenous Q10 min PRN Coletta Memos, MD       labetalol (  NORMODYNE) tablet 100 mg  100 mg Oral BID Coletta Memos, MD   100 mg at 06/18/22 0946   loratadine (CLARITIN) tablet 10 mg  10 mg Oral Daily Coletta Memos, MD   10 mg at 06/18/22 0946   morphine (PF) 2 MG/ML injection 1-2 mg  1-2 mg Intravenous Q2H PRN Coletta Memos, MD       naloxone [facility]) injection 0.08 mg  0.08 mg Intravenous PRN Coletta Memos, MD       ondansetron (ZOFRAN) tablet 4 mg  4 mg Oral Q4H PRN Coletta Memos, MD       Or   ondansetron (ZOFRAN) injection 4 mg  4 mg Intravenous Q4H PRN Coletta Memos, MD       Oral care mouth rinse  15 mL Mouth Rinse PRN Coletta Memos, MD       pantoprazole (PROTONIX) injection 40 mg  40 mg  Intravenous QHS Coletta Memos, MD   40 mg at 06/17/22 2119   promethazine (PHENERGAN) tablet 12.5-25 mg  12.5-25 mg Oral Q4H PRN Coletta Memos, MD       Tenapanor HCl (CKD) TABS 30 mg  30 mg Oral Rosalita Levan, MD       torsemide [facility]) tablet 100 mg  100 mg Oral Daily Coletta Memos, MD   100 mg at 06/18/22 1610     ALLERGIES Patient has no known allergies.  MEDICAL HISTORY Past Medical History:  Diagnosis Date   Anemia    Anemia in chronic kidney disease 03/20/2020   Bicuspid aortic valve    Blood transfusion without reported diagnosis    CAD (coronary artery disease)    proximal LAD 50%, mid LAD 50-60%, mid to distal LAD 99%; mid circumflex 20%; proximal RCA 20%, distal RCA 50%; PDA 60-70%, proximal posterior AV groove 50%, proximal posterior lateral 50%.  There was a question of possible LVOT gradient;  s/p Resolute DES to mid-distal LAD 10/2010;  echocardiogram 11/17/10: EF 55-60%, mild LVH, grade 1 diastolic dysfunction, normal aortic valve, mild MR, PASP 32.    Chicken pox    Dysrhythmia    A. Fib   Enlarged aorta    ESRD on peritoneal dialysis    Glaucoma    Gout    allopurinol , colchicine prn in past, has not had flares   Headache    History of kidney stones    Hypercholesterolemia    With hypertriglyceridemia   Hypertension    Mild aortic stenosis    PAF (paroxysmal atrial fibrillation)    a. Documented on event monitor 01/2016.   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   SDH (subdural hematoma) 08/2021   left   Sleep apnea    cpap nightly     SOCIAL HISTORY Social History   Socioeconomic History   Marital status: Married    Spouse name: Not on file   Number of children: 4   Years of education: Not on file   Highest education level: Not on file  Occupational History   Occupation: Engineer, petroleum: [employer]  Tobacco Use   Smoking status: Never   Smokeless tobacco: Never  Vaping Use   Vaping Use: Never used  Substance and Sexual Activity   Alcohol use: No   Drug use: No   Sexual activity: Yes  Other Topics Concern  Not on file  Social History Narrative   Family: Married, 4 children, 9 grandkids      Work: new job Engineer, manufacturing systems based out of [employer]    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)   BS at [employer]: reading, church activities- [employer]   Social Determinants of Health   Financial Resource Strain: Not on file  Food Insecurity: Not on file  Transportation Needs: Not on file  Physical Activity: Not on file  Stress: Not on file  Social Connections: Not on file  Intimate Partner Violence: Not on file     FAMILY HISTORY Family History  Problem Relation Age of Onset   Hypertension Father    CAD Father        CABG 3s   Kidney disease Father        Stage IV   Valvular heart disease Mother        AVR July 2015   Diabetes Paternal Grandmother    Colon cancer Neg Hx      Review of Systems: 12 systems were reviewed and negative except per HPI  Physical Exam: Vitals:   06/18/22 1300 06/18/22 1344  BP:  95/68  Pulse: 81 81  Resp: 12 12  Temp:    SpO2: 92% 92%   Total I/O In: 462.1 [I.V.:412.1; IV Piggyback:50] Out: 324 [Drains:50; Other:274]  Intake/Output Summary (Last 24 hours) at 06/18/2022 1454 Last data filed at 06/18/2022 1300 Gross per 24 hour  Intake 1666.4 ml  Output 454 ml  Net 1212.4 ml   General: drowsy/lethargic HEENT: anicteric sclera, MMM CV: normal rate, no murmurs, no edema Lungs: bilateral chest rise, normal wob Abd: PD cath c/d/I, soft, non-tender, non-distended Skin: no visible lesions or rashes Neuro: lethargic/drowsy responds to tactile/vocal stim Dialysis access: PD cath  Test Results Reviewed Lab Results  Component Value Date   NA 138 06/17/2022   K 4.5  06/17/2022   CL 104 06/17/2022   CO2 25 06/08/2022   BUN 78 (H) 06/17/2022   CREATININE 13.34 (H) 06/17/2022   GFR 23.61 (L) 10/24/2018   GLU 109 01/29/2020   CALCIUM 9.6 06/08/2022   ALBUMIN 2.4 (L) 02/27/2022   PHOS 6.7 (H) 02/27/2022    I have reviewed relevant outside healthcare records

## 2022-06-18 NOTE — Progress Notes (Signed)
Cpap on hold due lumbar drain for for CSF leak

## 2022-06-18 NOTE — Progress Notes (Signed)
Per neurosurgery keep lumbar drain clamped.  Orders modified.

## 2022-06-18 NOTE — Progress Notes (Signed)
Called to patient's room by family.  Patient seizing.  Seizure stopped after IV 2 mg of ativan given. Seizure lasted approximately 3 minutes.  Neurosurgery paged and new orders received.  Family at bedside updated.

## 2022-06-18 NOTE — Progress Notes (Signed)
Inpatient Rehab Admissions Coordinator Note:   Per PT patient was screened for CIR candidacy by Trinitie Mcgirr Luvenia Starch, CCC-SLP. At this time, pt appears to be a potential candidate for CIR. I will place an order for rehab consult for full assessment, per our protocol.  Please contact me any with questions.Daniel Phoenix, MS, CCC-SLP Admissions Coordinator [phone] 06/18/22 6:07 PM

## 2022-06-18 NOTE — Evaluation (Signed)
Physical Therapy Evaluation Patient Details Name: Daniel Solomon MRN: [id number] DOB: [birth year] Today's Date: 06/18/2022  History of Present Illness  Pt is a 62 y.o. male who presented on 06/17/22 for revision of cranioplasty due to CSF leak.   PMH:  left SDH (09/22/21; s/p Onyx embolization of left middle meningeal artery 09/28/21 with acute/subacute SDH 10/05/21 requiring craniotomy for left SDH evacuation and craniectomy for right epidural/SDH 10/07/21; cranioplasty with custom plate 40/98/11), anemia, bicuspid aortic valve, CAD, enlarged aorta, ESRD on peritoneal dialysis, glaucoma, gout, hypercholesterolemia, HTN, mild aortic stenosis, PAF, s/p TAVR, SDH, sleep apnea  Clinical Impression  Pt admitted with above diagnosis. Pt received in chair since yesterday evening (he cannot get comfortable in bed). Wife present. Pt with receptive and expressive aphasia, she reports that this worsened through the day yesterday. Pt able to get out some Yes/ No answers but seemed to be switching them at times. Request SLP eval. Pt with increased stiffness R side UE>LE. Needed mod A +2 to stand and could not step R foot. Stepped 2' fwd and back with therapist facilitating wt shift to L and advancement of RLE. Patient will benefit from intensive inpatient follow up therapy, >3 hours/day.  Pt currently with functional limitations due to the deficits listed below (see PT Problem List). Pt will benefit from acute skilled PT to increase their independence and safety with mobility to allow discharge.          Recommendations for follow up therapy are one component of a multi-disciplinary discharge planning process, led by the attending physician.  Recommendations may be updated based on patient status, additional functional criteria and insurance authorization.  Follow Up Recommendations       Assistance Recommended at Discharge Frequent or constant Supervision/Assistance  Patient can return home with the following  Two  people to help with walking and/or transfers;A lot of help with bathing/dressing/bathroom;Direct supervision/assist for medications management;Direct supervision/assist for financial management;Assist for transportation;Help with stairs or ramp for entrance;Assistance with cooking/housework;Assistance with feeding    Equipment Recommendations Other (comment) (TBD)  Recommendations for Other Services  Rehab consult;Speech consult    Functional Status Assessment Patient has had a recent decline in their functional status and demonstrates the ability to make significant improvements in function in a reasonable and predictable amount of time.     Precautions / Restrictions Precautions Precautions: Fall Restrictions Weight Bearing Restrictions: No Other Position/Activity Restrictions: lumbar drain      Mobility  Bed Mobility               General bed mobility comments: pt up in recliner, wife reports he cannot get comfortable in the bed    Transfers Overall transfer level: Needs assistance Equipment used: 2 person hand held assist Transfers: Sit to/from Stand Sit to Stand: Mod assist, +2 physical assistance           General transfer comment: mod A for sit>stand, R side fully supported, increased time needed    Ambulation/Gait Ambulation/Gait assistance: +2 physical assistance, Max assist Gait Distance (Feet): 2 Feet Assistive device: 2 person hand held assist Gait Pattern/deviations: Step-to pattern Gait velocity: decreased Gait velocity interpretation: <1.31 ft/sec, indicative of household ambulator   General Gait Details: pt needed manual assistance to move RLE fwd and bkwd as well as facilitated wt shifting to L.  Stairs            Wheelchair Mobility    Modified Rankin (Stroke Patients Only)       Balance Overall  balance assessment: Needs assistance Sitting-balance support: Feet supported, Single extremity supported Sitting balance-Leahy Scale:  Fair Sitting balance - Comments: sits with RUE hanging off front edge of chair. Grabs it with LUE when cued to attend to it. Able to wt shift to L, assist needed to R Postural control: Left lateral lean Standing balance support: Bilateral upper extremity supported, During functional activity Standing balance-Leahy Scale: Poor Standing balance comment: needed mod A +2 to maintain standing                             Pertinent Vitals/Pain Pain Assessment Pain Assessment: Faces Faces Pain Scale: Hurts even more Pain Location: denies pain verbally but do not trust this with aphasia, grimaces like he has pain when assisted in RUE mvmt and with sit>stand Pain Descriptors / Indicators: Grimacing, Guarding Pain Intervention(s): Limited activity within patient's tolerance, Monitored during session    Home Living Family/patient expects to be discharged to:: Private residence Living Arrangements: Spouse/significant other Available Help at Discharge: Family;Available 24 hours/day Type of Home: House Home Access: Stairs to enter Entrance Stairs-Rails: None Entrance Stairs-Number of Steps: 3   Home Layout: Able to live on main level with bedroom/bathroom Home Equipment: Agricultural consultant (2 wheels);Cane - single point Additional Comments: wife can work from home, mother lives next door    Prior Function Prior Level of Function : Independent/Modified Independent;Driving;Working/employed             Mobility Comments: had returned to independence after last admission, ambulating without AD, 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 Grimaces with attempts to flex elbow.    Lower Extremity Assessment Lower Extremity Assessment: RLE deficits/detail RLE Deficits /  Details: hip flex 3/5, knee ext 3-/5, no knee buckling in standing but pt has difficulty advancing extremity. Noted increased stiffness with passive movement. RLE Sensation: decreased proprioception RLE Coordination: decreased gross motor    Cervical / Trunk Assessment Cervical / Trunk Assessment: Kyphotic  Communication   Communication: Receptive difficulties;Expressive difficulties  Cognition Arousal/Alertness: Awake/alert Behavior During Therapy: Flat affect Overall Cognitive Status: Difficult to assess Area of Impairment: Following commands, Safety/judgement, Awareness, Problem solving                       Following Commands: Follows one step commands consistently Safety/Judgement: Decreased awareness of deficits Awareness: Intellectual Problem Solving: Slow processing, Decreased initiation, Difficulty sequencing, Requires verbal cues, Requires tactile cues General Comments: very slow processing, seems to have ~10 sec delay.  difficult to fully assess due to aphasia. Seems to sometimes switch his yes and no responses.        General Comments General comments (skin integrity, edema, etc.): wife reports RUE weakness and aphasia were present last summer and took several weeks to resolve. Pt seems very frustrated by symptoms. VSS throughout eval    Exercises     Assessment/Plan    PT Assessment Patient needs continued PT services  PT Problem List Decreased strength;Decreased range of motion;Decreased activity tolerance;Decreased balance;Decreased mobility;Decreased coordination;Decreased cognition;Decreased knowledge of use of DME;Decreased safety awareness;Decreased knowledge of precautions;Pain;Impaired tone;Impaired sensation       PT Treatment Interventions DME instruction;Gait training;Stair training;Functional mobility training;Therapeutic activities;Therapeutic exercise;Balance training;Neuromuscular re-education;Cognitive remediation;Patient/family education  PT Goals (Current goals can be found in the Care Plan section)  Acute Rehab PT Goals Patient Stated Goal: return home and work PT Goal Formulation: With patient/family Time For Goal Achievement: 07/02/22 Potential to Achieve Goals: Good    Frequency Min 3X/week     Co-evaluation               AM-PAC PT "6 Clicks" Mobility  Outcome Measure Help needed turning from your back to your side while in a flat bed without using bedrails?: A Lot Help needed moving from lying on your back to sitting on the side of a flat bed without using bedrails?: A Lot Help needed moving to and from a bed to a chair (including a wheelchair)?: Total Help needed standing up from a chair using your arms (e.g., wheelchair or bedside chair)?: Total Help needed to walk in hospital room?: Total Help needed climbing 3-5 steps with a railing? : Total 6 Click Score: 8    End of Session   Activity Tolerance: Patient limited by fatigue Patient left: in chair;with call bell/phone within reach;with family/visitor present Nurse Communication: Mobility status PT Visit Diagnosis: Unsteadiness on feet (R26.81);Hemiplegia and hemiparesis;Pain;Difficulty in walking, not elsewhere classified (R26.2) Hemiplegia - Right/Left: Right Hemiplegia - dominant/non-dominant: Dominant Hemiplegia - caused by: Other Nontraumatic intracranial hemorrhage Pain - Right/Left: Right Pain - part of body: Arm    Time: 9147-8295 PT Time Calculation (min) (ACUTE ONLY): 36 min   Charges:   PT Evaluation $PT Eval Moderate Complexity: 1 Mod PT Treatments $Therapeutic Activity: 8-22 mins        Lyanne Co, PT  Acute Rehab Services Secure chat preferred Office [phone]   Lawana Chambers Karielle Davidow 06/18/2022, 10:45 AM

## 2022-06-18 NOTE — Progress Notes (Signed)
Postop day 1.  Patient status post cranioplasty yesterday.  No events or problems overnight. He is afebrile.  His vital signs are stable.  Urine output is good.  Drain with moderate clear output. Awake and aware.  Follows commands bilaterally.  Stable right hemiparesis.  Dressing clean and dry.  Chest and abdomen benign.  Overall progressing reasonably well.  Continue supportive efforts and slow mobilization.  Continue drain.

## 2022-06-18 NOTE — Progress Notes (Signed)
Patient ID: Daniel Solomon, male   DOB: 13-Jun-1960, 62 y.o.   MRN: [id number] Ct findings noted. Will clamp lumbar drain. Somnolent currently.

## 2022-06-19 ENCOUNTER — Inpatient Hospital Stay (HOSPITAL_COMMUNITY): Payer: Managed Care, Other (non HMO)

## 2022-06-19 MED ORDER — DELFLEX-LC/2.5% DEXTROSE 394 MOSM/L IP SOLN: INTRAPERITONEAL | Status: DC

## 2022-06-19 NOTE — Progress Notes (Signed)
Pt refusing both doses (2200 and 0600) of subq heparin tonight for DVT prophylaxis.  Pt educated.  Notified Dr. Conchita Paris.

## 2022-06-19 NOTE — Progress Notes (Signed)
Patient looks much better today.  No further seizures.  Complains of some headache and some left hip discomfort.  No new numbness paresthesias or weakness.  Afebrile.  Vital signs are stable.  He is awake and alert.  He is oriented and appropriate.  Motor examination stable bilaterally.  Wound clean and dry.  Scalp flap with some buildup of subcutaneous fluid consistent with CSF.  Drain out.  Patient is status post revision cranioplasty.  Patient's postop course complicated by CSF hypotension secondary to suction drainage.  Symptoms resolved after drain removed however he does seem to be building up acetaminophen seal beneath the scalp flap.  Question whether patient will eventually need some type of CSF diversion.  Overall stable currently.  Begin mobilization.  Okay to feed.

## 2022-06-19 NOTE — Progress Notes (Signed)
[facility] Progress Note  Subjective: no c/o's , much more lucid today. Wts are up.   Vitals:   06/19/22 1030 06/19/22 1100 06/19/22 1200 06/19/22 1230  BP: (!) 149/138   108/68  Pulse: 69 84 73 64  Resp: Temp:   97.8 F (36.6 C)   TempSrc:   Oral   SpO2: 94% 90% (!) 87% 90%  Weight:      Height:        Exam: General: more alert and fully Ox 3 HEENT: anicteric sclera, MMM CV: normal rate, no murmurs, no edema Lungs: bilateral chest rise, normal wob Abd: PD cath c/d/I, soft, non-tender, non-distended Skin: no visible lesions or rashes Neuro: lethargic/drowsy responds to tactile/vocal stim Dialysis access: PD cath    OP PD: Dr Marisue Humble, [facility], 5 cycles, fill 3000, 1.5h dwell, 2500cc day bag, dry wt 84kg   Assessment/ Plan  Cranioplasty - as complication of a fall/ SDH 08/2021 that required surgical intervention. 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 Cont nightly PD while here.  Volume/ hypertension - had IVF"s running, just noticed. Wt's are up, will use all 2.5 % tonight.  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 Inpatient medications:  allopurinol  300 mg Oral Daily   atorvastatin  40 mg Oral Daily   calcitRIOL  0.25 mcg Oral Daily   Chlorhexidine Gluconate Cloth  6 each Topical Daily   ferric citrate  630 mg Oral TID WC   gabapentin  100 mg Oral QHS   gentamicin cream  1 Application Topical Daily   heparin injection (subcutaneous)  5,000 Units Subcutaneous Q8H   labetalol  100 mg Oral BID   loratadine  10 mg Oral Daily   pantoprazole (PROTONIX) IV  40 mg Intravenous QHS   Tenapanor HCl (CKD)  30 mg Oral  BH-qamhs   torsemide  100 mg Oral Daily    0.9 % NaCl with KCl 20 mEq / L 80 mL/hr at 06/19/22 1200    ceFAZolin (ANCEF) IV Stopped (06/19/22 1133)   dialysis solution 1.5% low-MG/low-CA     levETIRAcetam Stopped (06/19/22 1038)   acetaminophen **OR** acetaminophen, bisacodyl, HYDROcodone-acetaminophen, labetalol, morphine injection, naLOXone (NARCAN)  injection, ondansetron **OR** ondansetron (ZOFRAN) IV, mouth rinse, promethazine

## 2022-06-19 NOTE — Progress Notes (Signed)
Patient has verbalized desire to not take subq heparin and has refused both scheduled doses over night. Patient does not believe potential risks outweigh benefits.   NeuroSx Val Eagle, NP notified of red med refusal.

## 2022-06-19 NOTE — Progress Notes (Signed)
PD tx initation note: 19:55pm  Pre TX VS:   Pre TX weight: 92.6 kg  PD treatment initiated via aseptic technique. Consent signed and in chart. Patient is alert and oriented. No complaints of pain. No specimen collected. PD exit site clean, dry and intact. Gentamycin and new dressing applied. Bedside RN educated on PD machine and how to contact tech support when PD machine alarms.

## 2022-06-19 NOTE — Evaluation (Signed)
Occupational Therapy Evaluation Patient Details Name: Daniel Solomon MRN: [id number] DOB: 1960-10-10 Today's Date: 06/19/2022   History of Present Illness Pt is a 62 y.o. male who presented on 06/17/22 for revision of cranioplasty due to CSF leak.   PMH:  left SDH (09/22/21; s/p Onyx embolization of left middle meningeal artery 09/28/21 with acute/subacute SDH 10/05/21 requiring craniotomy for left SDH evacuation and craniectomy for right epidural/SDH 10/07/21; cranioplasty with custom plate 13/08/65), anemia, bicuspid aortic valve, CAD, enlarged aorta, ESRD on peritoneal dialysis, glaucoma, gout, hypercholesterolemia, HTN, mild aortic stenosis, PAF, s/p TAVR, SDH, sleep apnea..  4/20 Seizure.   Clinical Impression   Patient admitted for the diagnosis above.  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  Of note 4/20 patient with seizure activity.  Today presenting as more alert, moving R arm, and up and able to walk in the halls.  In addition, patient needing up to Min A for mobility, and Mod A for ADL completion.  OT is indicated in the acute setting to address deficits, and Patient will benefit from intensive inpatient follow up therapy, >3 hours/day.  Patient with slowed purposeful movement to R arm, and delayed processing noted.         Recommendations for follow up therapy are one component of a multi-disciplinary discharge planning process, led by the attending physician.  Recommendations may be updated based on patient status, additional functional criteria and insurance authorization.   Assistance Recommended at Discharge Frequent or constant Supervision/Assistance  Patient can return home with the following Help with stairs or ramp for entrance;A lot of help with bathing/dressing/bathroom;A little help with walking and/or transfers;Assist for transportation;Assistance with cooking/housework;Direct supervision/assist for financial management;Direct supervision/assist  for medications management    Functional Status Assessment  Patient has had a recent decline in their functional status and demonstrates the ability to make significant improvements in function in a reasonable and predictable amount of time.  Equipment Recommendations  None recommended by OT    Recommendations for Other Services       Precautions / Restrictions Precautions Precautions: Fall Precaution Comments: Seizure 4/20 Restrictions Weight Bearing Restrictions: No Other Position/Activity Restrictions: lumbar drain      Mobility Bed Mobility               General bed mobility comments: up in the recliner Patient Response: Cooperative  Transfers Overall transfer level: Needs assistance Equipment used: Rolling walker (2 wheels) Transfers: Sit to/from Stand, Bed to chair/wheelchair/BSC Sit to Stand: Min guard     Step pivot transfers: Min guard, Min assist            Balance Overall balance assessment: Needs assistance Sitting-balance support: Feet supported, Single extremity supported Sitting balance-Leahy Scale: Fair     Standing balance support: Reliant on assistive device for balance Standing balance-Leahy Scale: Poor                             ADL either performed or assessed with clinical judgement   ADL Overall ADL's : Needs assistance/impaired     Grooming: Wash/dry hands;Wash/dry face;Min guard;Standing;Cueing for sequencing   Upper Body Bathing: Minimal assistance;Sitting   Lower Body Bathing: Moderate assistance;Sit to/from stand   Upper Body Dressing : Moderate assistance;Sitting   Lower Body Dressing: Moderate assistance;Sit to/from stand   Toilet Transfer: Minimal assistance;Rolling walker (2 wheels);Regular Toilet;Ambulation  Vision Baseline Vision/History: 1 Wears glasses Vision Assessment?: No apparent visual deficits;Vision impaired- to be further tested in functional context      Perception     Praxis      Pertinent Vitals/Pain Pain Assessment Pain Assessment: No/denies pain     Hand Dominance Right   Extremity/Trunk Assessment Upper Extremity Assessment Upper Extremity Assessment: RUE deficits/detail RUE Deficits / Details: Able to move through AROM, slowed and purposeful. RUE Sensation: WNL RUE Coordination: decreased fine motor;decreased gross motor   Lower Extremity Assessment Lower Extremity Assessment: Defer to PT evaluation   Cervical / Trunk Assessment Cervical / Trunk Assessment: Kyphotic   Communication Communication Communication: Receptive difficulties;Expressive difficulties   Cognition Arousal/Alertness: Awake/alert Behavior During Therapy: Flat affect Overall Cognitive Status: Impaired/Different from baseline                             Awareness: Emergent Problem Solving: Slow processing, Decreased initiation       General Comments   VSS on RA    Exercises     Shoulder Instructions      Home Living Family/patient expects to be discharged to:: Private residence Living Arrangements: Spouse/significant other Available Help at Discharge: Family;Available 24 hours/day Type of Home: House Home Access: Stairs to enter [employer] of Steps: 3 Entrance Stairs-Rails: None Home Layout: Able to live on main level with bedroom/bathroom     Bathroom Shower/Tub: Producer, television/film/video: Handicapped height Bathroom Accessibility: Yes How Accessible: Accessible via wheelchair Home Equipment: Agricultural consultant (2 wheels);Cane - single point   Additional Comments: wife can work from home, mother lives next door      Prior Functioning/Environment Prior Level of Function : Independent/Modified Independent;Driving;Working/employed             Mobility Comments: had returned to independence after last admission, ambulating without AD, 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 ADLs Comments: works  as a Quarry manager, no assist with ADL or iADL        OT Problem List: Decreased strength;Impaired balance (sitting and/or standing);Impaired UE functional use;Decreased knowledge of use of DME or AE;Decreased safety awareness;Decreased cognition      OT Treatment/Interventions: Self-care/ADL training;Therapeutic exercise;Therapeutic activities;Cognitive remediation/compensation;Patient/family education;DME and/or AE instruction;Balance training    OT Goals(Current goals can be found in the care plan section) Acute Rehab OT Goals Patient Stated Goal: Return home OT Goal Formulation: With patient Time For Goal Achievement: 07/01/22 Potential to Achieve Goals: Good ADL Goals Pt Will Perform Grooming: with modified independence;standing Pt Will Perform Lower Body Dressing: with modified independence;sit to/from stand Pt Will Transfer to Toilet: with modified independence;ambulating;regular height toilet Pt/caregiver will Perform Home Exercise Program: Increased strength;Both right and left upper extremity;With theraband;With Supervision  OT Frequency: Min 2X/week    Co-evaluation              AM-PAC OT "6 Clicks" Daily Activity     Outcome Measure Help from another person eating meals?: A Little Help from another person taking care of personal grooming?: A Little Help from another person toileting, which includes using toliet, bedpan, or urinal?: A Little Help from another person bathing (including washing, rinsing, drying)?: A Lot Help from another person to put on and taking off regular upper body clothing?: A Lot Help from another person to put on and taking off regular lower body clothing?: A Lot 6 Click Score: 15   End of Session Equipment Utilized During Treatment: Gait belt;Rolling  walker (2 wheels) Nurse Communication: Mobility status  Activity Tolerance: Patient tolerated treatment well Patient left: in chair;with call bell/phone within reach;with family/visitor  present  OT Visit Diagnosis: Unsteadiness on feet (R26.81);Muscle weakness (generalized) (M62.81);Ataxia, unspecified (R27.0);Other symptoms and signs involving cognitive function                Time: 1610-9604 OT Time Calculation (min): 22 min Charges:  OT General Charges $OT Visit: 1 Visit OT Evaluation $OT Eval Moderate Complexity: 1 Mod  06/19/2022  RP, OTR/L  Acute Rehabilitation Services  Office:  [phone]   Suzanna Obey 06/19/2022, 9:28 AM

## 2022-06-20 MED ORDER — PANTOPRAZOLE SODIUM 40 MG PO TBEC
40.0000 mg | DELAYED_RELEASE_TABLET | Freq: Every day | ORAL | Status: DC
  Administered 2022-06-20 – 2022-06-25 (×6): 40 mg via ORAL
  Filled 2022-06-20 (×6): qty 1

## 2022-06-20 MED ORDER — DELFLEX-LC/2.5% DEXTROSE 394 MOSM/L IP SOLN: INTRAPERITONEAL | Status: DC

## 2022-06-20 MED ORDER — DELFLEX-LC/1.5% DEXTROSE 344 MOSM/L IP SOLN: INTRAPERITONEAL | Status: DC

## 2022-06-20 NOTE — Progress Notes (Signed)
  Transition of Care [facility]) Screening Note   Patient Details  Name: Daniel Solomon Date of Birth: 01/19/1961   Transition of Care [facility]) CM/SW Contact:    Mearl Latin, LCSW Phone Number: 06/20/2022, 9:06 AM    Transition of Care Department [facility]) has reviewed patient and will follow for CIR assessment of therapies. We will continue to monitor patient advancement through interdisciplinary progression rounds. If new patient transition needs arise, please place a TOC consult.

## 2022-06-20 NOTE — Anesthesia Postprocedure Evaluation (Addendum)
Anesthesia Post Note  Patient: Daniel Solomon  Procedure(s) Performed: REVISION OF CRANIOPLASTY/PLACEMENT OF LUMBAR DRAIN PLACEMENT OF LUMBAR DRAIN     Patient location during evaluation: PACU Anesthesia Type: General Level of consciousness: awake and patient cooperative Pain management: pain level controlled Vital Signs Assessment: post-procedure vital signs reviewed and stable Respiratory status: spontaneous breathing, nonlabored ventilation, respiratory function stable and patient connected to nasal cannula oxygen Cardiovascular status: blood pressure returned to baseline and stable Postop Assessment: no apparent nausea or vomiting Anesthetic complications: yes   Encounter Notable Events  Notable Event Outcome Phase Comment  Difficult to intubate - unexpected  Intraprocedure Filed from anesthesia note documentation.                Alysia Scism

## 2022-06-20 NOTE — Progress Notes (Signed)
Patient ID: Daniel Solomon, male   DOB: Mar 07, 1960, 62 y.o.   MRN: [id number] BP (!) 125/99 (BP Location: Right Arm)   Pulse 82   Temp 97.6 F (36.4 C) (Axillary)   Resp 15   Ht  (1.727 m)   Wt 92.6 kg   SpO2 90%   BMI 31.04 kg/m  Alert, oriented x 4 Some fluid in scalp flap Will take off 20cc then clamp drain  Poor fine motor movement right hand Wound is clean, dry, no signs of infection

## 2022-06-20 NOTE — Progress Notes (Signed)
Physical Therapy Treatment Patient Details Name: Daniel Solomon MRN: [id number] DOB: 02/08/1961 Today's Date: 06/20/2022   History of Present Illness Pt is a 62 y.o. male who presented on 06/17/22 for revision of cranioplasty due to CSF leak.   PMH:  left SDH (09/22/21; s/p Onyx embolization of left middle meningeal artery 09/28/21 with acute/subacute SDH 10/05/21 requiring craniotomy for left SDH evacuation and craniectomy for right epidural/SDH 10/07/21; cranioplasty with custom plate 21/30/86), anemia, bicuspid aortic valve, CAD, enlarged aorta, ESRD on peritoneal dialysis, glaucoma, gout, hypercholesterolemia, HTN, mild aortic stenosis, PAF, s/p TAVR, SDH, sleep apnea..  4/20 Seizure.    PT Comments    The pt was agreeable to session with focus on gait training and assessment of dual-task abilities (mobility + cog). The pt was able to demo improved stability for transfers, but benefits from verbal cues for hand placement. He was able to progress to hallway ambulation without UE support, but uses slowed pace with increased concentration, guarding, and has poor tolerance for balance challenge. The pt also demos deficits in problem solving, sequencing, and memory with addition of dual-task challenge. Continue to recommend intensive therapies when medically stable for d/c.      Recommendations for follow up therapy are one component of a multi-disciplinary discharge planning process, led by the attending physician.  Recommendations may be updated based on patient status, additional functional criteria and insurance authorization.  Follow Up Recommendations       Assistance Recommended at Discharge Frequent or constant Supervision/Assistance  Patient can return home with the following A little help with walking and/or transfers;A little help with bathing/dressing/bathroom;Assistance with cooking/housework;Direct supervision/assist for medications management;Direct supervision/assist for financial  management;Assist for transportation;Help with stairs or ramp for entrance   Equipment Recommendations  Other (comment) (defer to post acute)    Recommendations for Other Services       Precautions / Restrictions Precautions Precautions: Fall Precaution Comments: Seizure 4/20 Restrictions Weight Bearing Restrictions: No     Mobility  Bed Mobility               General bed mobility comments: up in the recliner    Transfers Overall transfer level: Needs assistance Equipment used: Rolling walker (2 wheels) Transfers: Sit to/from Stand, Bed to chair/wheelchair/BSC Sit to Stand: Min guard           General transfer comment: min gaurd for sit<>stand, cues for hand placement    Ambulation/Gait Ambulation/Gait assistance: Min guard Gait Distance (Feet): 150 Feet Assistive device: None Gait Pattern/deviations: Step-through pattern, Decreased step length - right, Decreased stride length, Shuffle Gait velocity: decreased Gait velocity interpretation: <1.31 ft/sec, indicative of household ambulator   General Gait Details: head flexed to R, feet externally rotated, walking without DME but at a slowed and concentrated pace throughout, pt able to accurately complete simple counting and naming tasks with ambulation but with slowed movements and increased time + single LOB. patient noting difficulty with recalling alphabetical order and requiring increased time      Balance Overall balance assessment: Needs assistance Sitting-balance support: Feet supported, Single extremity supported Sitting balance-Leahy Scale: Fair     Standing balance support: No upper extremity supported, Bilateral upper extremity supported, During functional activity Standing balance-Leahy Scale: Fair Standing balance comment: did progress to ambulation without the walker, but gaurded and slowed. poor tolerance of challenge                            Cognition  Arousal/Alertness:  Awake/alert Behavior During Therapy: Flat affect Overall Cognitive Status: Impaired/Different from baseline Area of Impairment: Attention, Memory, Following commands, Safety/judgement, Awareness, Problem solving                   Current Attention Level: Sustained Memory: Decreased recall of precautions, Decreased short-term memory Following Commands: Follows one step commands with increased time Safety/Judgement: Decreased awareness of safety, Decreased awareness of deficits Awareness: Emergent Problem Solving: Slow processing, Decreased initiation, Difficulty sequencing, Requires verbal cues General Comments: Slow processing throughout, diffculty completing clock drawing, significant time required to sequence tooth brushing, and pausing and stating out loud, "how do I do this again?" able to complete counting and naming with ambulaiton, but does impact balance        Exercises      General Comments General comments (skin integrity, edema, etc.): VSS on RA. wife reports LUE movements worse today than yesterday      Pertinent Vitals/Pain Pain Assessment Pain Assessment: Faces Faces Pain Scale: Hurts even more Pain Location: back pain with movement initially, also grimacing at IV Pain Descriptors / Indicators: Grimacing, Guarding Pain Intervention(s): Limited activity within patient's tolerance, Monitored during session, Repositioned     PT Goals (current goals can now be found in the care plan section) Acute Rehab PT Goals Patient Stated Goal: return home and work PT Goal Formulation: With patient/family Time For Goal Achievement: 07/02/22 Potential to Achieve Goals: Good Progress towards PT goals: Progressing toward goals    Frequency    Min 3X/week      PT Plan Current plan remains appropriate       AM-PAC PT "6 Clicks" Mobility   Outcome Measure  Help needed turning from your back to your side while in a flat bed without using bedrails?: A Lot Help  needed moving from lying on your back to sitting on the side of a flat bed without using bedrails?: A Lot Help needed moving to and from a bed to a chair (including a wheelchair)?: A Little Help needed standing up from a chair using your arms (e.g., wheelchair or bedside chair)?: A Little Help needed to walk in hospital room?: A Little Help needed climbing 3-5 steps with a railing? : A Lot 6 Click Score: 15    End of Session Equipment Utilized During Treatment: Gait belt Activity Tolerance: Patient limited by fatigue Patient left: in chair;with call bell/phone within reach;with family/visitor present Nurse Communication: Mobility status PT Visit Diagnosis: Unsteadiness on feet (R26.81);Hemiplegia and hemiparesis;Pain;Difficulty in walking, not elsewhere classified (R26.2) Hemiplegia - Right/Left: Right Hemiplegia - dominant/non-dominant: Dominant Hemiplegia - caused by: Other Nontraumatic intracranial hemorrhage Pain - Right/Left: Right Pain - part of body: Arm     Time: 1610-9604 PT Time Calculation (min) (ACUTE ONLY): 20 min  Charges:  $Gait Training: 8-22 mins                     Vickki Muff, PT, DPT   Acute Rehabilitation Department Office [phone] Secure Chat Communication Preferred   Ronnie Derby 06/20/2022, 11:50 AM

## 2022-06-20 NOTE — Progress Notes (Signed)
Inpatient Rehab Admissions Coordinator:      I met with Pt.'s wife to dicsuss potential CIR admit. She and Pt. Have discussed it and they would like to pursue CIR admit. Wife confirms that she can provide 24/7 assist at d/c. I will open a case with insurance and pursue for admit.   Megan Salon, MS, CCC-SLP Rehab Admissions Coordinator  [phone] (celll) [phone] (office)

## 2022-06-20 NOTE — Progress Notes (Signed)
Occupational Therapy Treatment Patient Details Name: Daniel Solomon MRN: [id number] DOB: 11/21/1960 Today's Date: 06/20/2022   History of present illness Pt is a 62 y.o. male who presented on 06/17/22 for revision of cranioplasty due to CSF leak.   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  4/20 Seizure.   OT comments  Patient continues to make steady progress towards goals in skilled OT session. Patient's session encompassed  visual assessment, further assessment of RUE, and completion of sequencing and multi-step tasks. Patient with difficulty tracking up and down, blinking to refocus, decreased smoothness in tracking horizontally, noted blinking and returning to midline instead of being able to track fully to the left or right, reporting eye fatigue, but unable to state other than "my vision feels off" what is bothering him. Patient could not maintain saccades, even at a slow speed, and eyes did not converge when tested. Patient completing clock drawing, with numbers skewed, and requiring increased time to complete. Patient also with difficulty sequencing tooth brushing task requiring increased time. Patient handed off to PT to complete further assessment of ambulation. OT continues to highly recommend therapy >3 hours. OT will continue to follow.     Recommendations for follow up therapy are one component of a multi-disciplinary discharge planning process, led by the attending physician.  Recommendations may be updated based on patient status, additional functional criteria and insurance authorization.    Assistance  Recommended at Discharge Frequent or constant Supervision/Assistance  Patient can return home with the following  Help with stairs or ramp for entrance;A lot of help with bathing/dressing/bathroom;A little help with walking and/or transfers;Assist for transportation;Assistance with cooking/housework;Direct supervision/assist for financial management;Direct supervision/assist for medications management   Equipment Recommendations  Other (comment) (Defer to next venue)    Recommendations for Other Services      Precautions / Restrictions Precautions Precautions: Fall Precaution Comments: Seizure 4/20 Restrictions Weight Bearing Restrictions: No       Mobility Bed Mobility               General bed mobility comments: up in the recliner    Transfers Overall transfer level: Needs assistance Equipment used: Rolling walker (2 wheels) Transfers: Sit to/from Stand, Bed to chair/wheelchair/BSC Sit to Stand: Min guard           General transfer comment: min gaurd for sit<>stand, progressing to walking without the walker, but at a slowed and concentrated pace throughout, PT providing patient dual task activities while walking (counting backwards, name animals that begin with the letter in alphabetical order) with patient noting difficulty with recalling alphabetical order and requiring increased time     Balance Overall balance assessment: Needs assistance Sitting-balance support: Feet supported, Single extremity supported Sitting balance-Leahy Scale: Fair     Standing balance support: No upper extremity supported, Bilateral upper extremity supported, During functional activity Standing balance-Leahy Scale: Fair Standing balance comment: did progress to ambulation without the walker, but gaurded and slowed                           ADL either performed or assessed with clinical judgement   ADL Overall ADL's : Needs assistance/impaired  Eating/Feeding: Set  up;Sitting   Grooming: Wash/dry hands;Wash/dry face;Oral care;Min guard;Cueing for sequencing Grooming Details (indicate cue type and reason): significant difficulty sequencing tooth brushing task                 Toilet Transfer: Minimal assistance;Rolling walker (2 wheels);Regular Toilet;Ambulation           Functional mobility during ADLs: Minimal assistance;Rolling walker (2 wheels);Cueing for sequencing;Cueing for safety General ADL Comments: Patient session focus on visual assessment, further assessment of RUE, and completion of sequencing and multi-step tasks.    Extremity/Trunk Assessment Upper Extremity Assessment Upper Extremity Assessment: RUE deficits/detail RUE Deficits / Details: Stiff movements through AROM, cannot maintain shoulder flexion against gravity, decreased grip noted in RUE, as well as decreased fine motor coordination RUE Sensation: WNL RUE Coordination: decreased fine motor;decreased gross motor   Lower Extremity Assessment Lower Extremity Assessment: Defer to PT evaluation        Vision   Vision Assessment?: Yes Eye Alignment: Within Functional Limits Ocular Range of Motion: Impaired-to be further tested in functional context;Restricted looking down;Restricted looking up (Difficulty looking up and down, blinking to refocus) Alignment/Gaze Preference: Within Defined Limits Tracking/Visual Pursuits: Decreased smoothness of horizontal tracking;Decreased smoothness of vertical tracking;Other (comment) (Decreased smoothness, noted blinking and returning to midline instead of being able to track fully, reports eye fatigue, but unable to state other than "my vision feels off" what is bothering him) Saccades: Decreased speed of saccadic movement;Impaired - to be further tested in functional context (able to complete 3 rounds, but could not maintain) Convergence: Impaired (comment) (eyes did not converge despite increased attempts) Visual Fields: No  apparent deficits Additional Comments: Patient with difficulty looking up and down, blinking to refocus, decreased smoothness in tracking, noted blinking and returning to midline instead of being able to track fully, reports eye fatigue, but unable to state other than "my vision feels off" what is bothering him. Patient could not maintain saccades, even at a slow speed, and eyes did not converge when tested.   Perception     Praxis Praxis Praxis: Impaired Praxis Impairment Details: Initiation;Motor planning    Cognition Arousal/Alertness: Awake/alert Behavior During Therapy: Flat affect Overall Cognitive Status: Impaired/Different from baseline Area of Impairment: Attention, Memory, Following commands, Safety/judgement, Awareness, Problem solving                   Current Attention Level: Sustained Memory: Decreased recall of precautions, Decreased short-term memory Following Commands: Follows one step commands with increased time Safety/Judgement: Decreased awareness of safety, Decreased awareness of deficits Awareness: Emergent Problem Solving: Slow processing, Decreased initiation, Difficulty sequencing, Requires verbal cues General Comments: Slow processing throughout, diffculty completing clock drawing, significant time required to sequence tooth brushing, and pausing and stating out loud, "how do I do this again?"        Exercises      Shoulder Instructions       General Comments      Pertinent Vitals/ Pain       Pain Assessment Pain Assessment: Faces Faces Pain Scale: Hurts even more Pain Location: back pain with movement initially, also grimacing at IV Pain Descriptors / Indicators: Grimacing, Guarding Pain Intervention(s): Limited activity within patient's tolerance, Monitored during session, Repositioned  Home Living  Prior Functioning/Environment              Frequency  Min 2X/week         Progress Toward Goals  OT Goals(current goals can now be found in the care plan section)  Progress towards OT goals: Progressing toward goals  Acute Rehab OT Goals Patient Stated Goal: to get better OT Goal Formulation: With patient Time For Goal Achievement: 07/01/22 Potential to Achieve Goals: Good  Plan Discharge plan remains appropriate    Co-evaluation                 AM-PAC OT "6 Clicks" Daily Activity     Outcome Measure   Help from another person eating meals?: A Little Help from another person taking care of personal grooming?: A Little Help from another person toileting, which includes using toliet, bedpan, or urinal?: A Little Help from another person bathing (including washing, rinsing, drying)?: A Lot Help from another person to put on and taking off regular upper body clothing?: A Lot Help from another person to put on and taking off regular lower body clothing?: A Lot 6 Click Score: 15    End of Session Equipment Utilized During Treatment: Gait belt;Rolling walker (2 wheels)  OT Visit Diagnosis: Unsteadiness on feet (R26.81);Muscle weakness (generalized) (M62.81);Ataxia, unspecified (R27.0);Other symptoms and signs involving cognitive function   Activity Tolerance Patient tolerated treatment well   Patient Left in chair;with call bell/phone within reach;with family/visitor present   Nurse Communication Mobility status        Time: 9604-5409 OT Time Calculation (min): 18 min  Charges: OT General Charges $OT Visit: 1 Visit OT Treatments $Self Care/Home Management : 8-22 mins  Pollyann Glen E. Darlen Gledhill, OTR/L Acute Rehabilitation Services [phone]   Cherlyn Cushing 06/20/2022, 11:01 AM

## 2022-06-20 NOTE — Progress Notes (Signed)
[facility] Progress Note  Subjective: no c/o's , sitting up in bedside chair.  Removed 1500 with PD overnight.  They think high weights are wrong  Vitals:   06/20/22 0500 06/20/22 0600 06/20/22 0700 06/20/22 0800  BP: (!) 95/53 134/74 107/64 121/74  Pulse: 90 86 83 89  Resp: Temp:    98.1 F (36.7 C)  TempSrc:    Oral  SpO2: 94% 95% 95% 94%  Weight:      Height:        Exam: General: more alert and fully Ox 3 HEENT: anicteric sclera, MMM CV: normal rate, no murmurs, no edema Lungs: bilateral chest rise, normal wob Abd: PD cath c/d/I, soft, non-tender, non-distended Skin: no visible lesions or rashes Neuro: lethargic/drowsy responds to tactile/vocal stim Dialysis access: PD cath    OP PD: Dr Marisue Humble, [facility], 5 cycles, fill 3000, 1.5h dwell, 2500cc day bag, dry wt 84kg -  uses 1/2 and 1/2 1's and 2's  Assessment/ Plan  Cranioplasty - as complication of a fall/ SDH 08/2021 that required surgical intervention. 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 Cont nightly PD while here-  go back to home prescription.  Volume/ hypertension -  weight up-  used all 2.5 % overnight. Also on labetalol-  and torsemide ?  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 Inpatient medications:  allopurinol  300 mg Oral Daily   atorvastatin  40 mg Oral Daily   calcitRIOL  0.25 mcg Oral Daily   Chlorhexidine Gluconate Cloth  6 each Topical Daily   ferric citrate  630 mg Oral TID WC   gabapentin  100 mg Oral QHS   gentamicin cream  1 Application Topical Daily   heparin  injection (subcutaneous)  5,000 Units Subcutaneous Q8H   labetalol  100 mg Oral BID   loratadine  10 mg Oral Daily   pantoprazole (PROTONIX) IV  40 mg Intravenous QHS   Tenapanor HCl (CKD)  30 mg Oral BH-qamhs   torsemide  100 mg Oral Daily     ceFAZolin (ANCEF) IV Stopped (06/19/22 1133)   dialysis solution 2.5% low-MG/low-CA     levETIRAcetam 500 mg (06/20/22 0911)   acetaminophen **OR** acetaminophen, bisacodyl, HYDROcodone-acetaminophen, labetalol, morphine injection, naLOXone (NARCAN)  injection, ondansetron **OR** ondansetron (ZOFRAN) IV, mouth rinse, promethazine

## 2022-06-21 ENCOUNTER — Inpatient Hospital Stay (HOSPITAL_COMMUNITY): Payer: Managed Care, Other (non HMO)

## 2022-06-21 NOTE — Progress Notes (Signed)
Patient ID: Daniel Solomon, male   DOB: [birth year], 62 y.o.   MRN: [id number] BP 113/84   Pulse 61   Temp 98.2 F (36.8 C) (Axillary)   Resp 13   Ht  (1.727 m)   Wt 92.6 kg   SpO2 99%   BMI 31.04 kg/m  Alert, oriented x 4 Speech is clear and fluent Moving all extremities Still with large subdural fluid collection, though Improved since initial scan

## 2022-06-21 NOTE — Procedures (Signed)
PD Note  Patient taken off treatment, sleeping and awakened for the disconnection.  Wife at bedside.  Both stated that there was just one issue with a slow drain during the night.  Total UF recorded on the cycler 2362 ml.  Day UF of 74ml and Night UF of 2288 ml

## 2022-06-21 NOTE — Progress Notes (Signed)
Pt vomiting, zofran given. Scanner in room not working. Pt assisted back to bed.

## 2022-06-21 NOTE — Progress Notes (Signed)
[facility] Progress Note  Subjective: pt resting-  wife said he threw up this AM.  Removed 23620 with PD overnight.  They think high weights are wrong-  no PD issues   Vitals:   06/21/22 0600 06/21/22 0800 06/21/22 0815 06/21/22 0840  BP: 118/82  (!) 145/77 139/83  Pulse: 65  85 84  Resp: Temp:  98.2 F (36.8 C)    TempSrc:  Oral    SpO2: 95%  94% 93%  Weight:      Height:        Exam: General: more alert and fully Ox 3 HEENT: anicteric sclera, MMM CV: normal rate, no murmurs, no edema Lungs: bilateral chest rise, normal wob Abd: PD cath c/d/I, soft, non-tender, non-distended Skin: no visible lesions or rashes Neuro: lethargic/drowsy responds to tactile/vocal stim Dialysis access: PD cath    OP PD: Dr Marisue Humble, [facility], 5 cycles, fill 3000, 1.5h dwell, 2500cc day bag, dry wt 84kg -  uses 1/2 and 1/2 1's and 2's  Assessment/ Plan  Cranioplasty - as complication of a fall/ SDH 08/2021 that required surgical intervention. 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 Cont nightly PD while here-  keep on home prescription. Will check labs tomorrow  Volume/ hypertension -  weight up-  used all 2.5 % overnight on 4/21. Also on labetalol-  and torsemide ?  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 Inpatient medications:  allopurinol  300 mg Oral Daily   atorvastatin  40 mg Oral Daily   calcitRIOL  0.25 mcg Oral Daily   Chlorhexidine Gluconate Cloth  6 each Topical Daily   ferric citrate  630 mg Oral TID WC   gabapentin  100 mg Oral QHS    gentamicin cream  1 Application Topical Daily   heparin injection (subcutaneous)  5,000 Units Subcutaneous Q8H   labetalol  100 mg Oral BID   loratadine  10 mg Oral Daily   pantoprazole  40 mg Oral QHS   torsemide  100 mg Oral Daily     ceFAZolin (ANCEF) IV Stopped (06/20/22 1042)   dialysis solution 1.5% low-MG/low-CA     dialysis solution 2.5% low-MG/low-CA     levETIRAcetam Stopped (06/20/22 2139)   acetaminophen **OR** acetaminophen, bisacodyl, HYDROcodone-acetaminophen, labetalol, morphine injection, naLOXone (NARCAN)  injection, ondansetron **OR** ondansetron (ZOFRAN) IV, mouth rinse, promethazine

## 2022-06-21 NOTE — Progress Notes (Signed)
Inpatient Rehab Admissions Coordinator:    I received insurance auth for CIR today; however, Pt. Not yet medically ready for d/c to CIR. I will continue to follow for potential admit once medically stable.   Megan Salon, MS, CCC-SLP Rehab Admissions Coordinator  [phone] (celll) [phone] (office)

## 2022-06-21 NOTE — Evaluation (Signed)
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   Subdural hematoma 09/22/2021   S/P TAVR (transcatheter aortic valve replacement) 01/19/2021   Severe aortic stenosis    Allergy, unspecified, sequela 03/20/2020   Anemia in chronic kidney disease 03/20/2020   Coagulation defect, unspecified 03/20/2020   ESRD on peritoneal dialysis 03/20/2020   History of urinary stone 03/20/2020   Iron deficiency anemia, unspecified 03/20/2020   Nephrotic syndrome with focal and segmental glomerular lesions 03/20/2020   Secondary hyperparathyroidism of renal origin 03/20/2020   Dilated aortic root 12/14/2017   Morbid obesity 10/04/2016   Paroxysmal atrial fibrillation 03/02/2016   Branch retinal artery occlusion 03/01/2016   Mild aortic stenosis 08/11/2014   Gout    Carotid bruit 02/09/2012   OSA (obstructive sleep apnea) 12/06/2010   CAD (coronary artery disease) 12/06/2010   Mixed hyperlipidemia 12/06/2010   HTN (hypertension) 12/06/2010   Bradycardia 12/06/2010   Past Medical History:  Past Medical History:  Diagnosis Date   Anemia    Anemia in chronic kidney disease 03/20/2020   Bicuspid aortic valve    Blood transfusion without reported diagnosis    CAD (coronary artery disease)    proximal LAD 50%, mid LAD 50-60%, mid to distal LAD 99%; mid circumflex 20%; proximal RCA 20%, distal RCA 50%; PDA 60-70%, proximal posterior AV groove 50%, proximal posterior lateral 50%.  There was a question of possible LVOT gradient;  s/p Resolute  DES to mid-distal LAD 10/2010;  echocardiogram 11/17/10: EF 55-60%, mild LVH, grade 1 diastolic dysfunction, normal aortic valve, mild MR, PASP 32.    Chicken pox    Dysrhythmia    A. Fib   Enlarged aorta    ESRD on peritoneal dialysis    Glaucoma    Gout    allopurinol , colchicine prn in past, has not had flares   Headache    History of kidney stones    Hypercholesterolemia    With hypertriglyceridemia   Hypertension    Mild aortic stenosis    PAF (paroxysmal atrial fibrillation)    a. Documented on event monitor 01/2016.   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   SDH (subdural hematoma) 08/2021   left   Sleep apnea    cpap nightly   Past Surgical History:  Past Surgical History:  Procedure Laterality Date   COLONOSCOPY  2018   CORONARY STENT PLACEMENT  02/28/2010   CRANIOPLASTY N/A 02/25/2022   Procedure: Cranioplasty with custom plate;  Surgeon: Coletta Memos, MD;  Location: [facility] OR;  Service: Neurosurgery;  Laterality: N/A;   CRANIOTOMY Left 10/05/2021   Procedure: CRANIOTOMY HEMATOMA EVACUATION SUBDURAL;  Surgeon: Coletta Memos, MD;  Location: [facility] OR;  Service: Neurosurgery;  Laterality: Left;   CRANIOTOMY Left 10/07/2021   Procedure: Craniectomy for Recurrent  Intracranial Hematoma;  Surgeon: Coletta Memos, MD;  Location: [facility] OR;  Service: Neurosurgery;  Laterality: Left;   CYSTOSCOPY/RETROGRADE/URETEROSCOPY  03/26/2012   Procedure: CYSTOSCOPY/RETROGRADE/URETEROSCOPY;  Surgeon: Garnett Farm, MD;  Location: [facility];  Service: Urology;  Laterality: Bilateral;  CYSTOSCOPY BILATERAL RETROGRADE PYELOGRAM AND POSSIBLE URETEROSCOPY   HERNIA REPAIR  2021   Umbilical Repair   INTRAOPERATIVE TRANSTHORACIC ECHOCARDIOGRAM N/A 01/19/2021   Procedure: INTRAOPERATIVE TRANSTHORACIC ECHOCARDIOGRAM;  Surgeon: Kathleene Hazel, MD;  Location: [facility] OR;  Service: Open Heart Surgery;  Laterality: N/A;   IR ANGIO EXTERNAL CAROTID SEL EXT CAROTID UNI L MOD SED  09/28/2021   IR ANGIO INTRA EXTRACRAN SEL INTERNAL CAROTID UNI L MOD SED  09/28/2021   IR ANGIOGRAM FOLLOW UP STUDY  09/28/2021   IR ANGIOGRAM FOLLOW UP STUDY  09/30/2021   IR NEURO EACH ADD'L AFTER BASIC UNI LEFT (MS)  09/28/2021   IR NEURO EACH ADD'L AFTER BASIC UNI RIGHT (MS)  09/30/2021   IR TRANSCATH/EMBOLIZ  09/28/2021   RADIOLOGY WITH ANESTHESIA N/A 09/28/2021   Procedure: Middle meningeal artery embolization;  Surgeon: Lisbeth Renshaw, MD;  Location[facility] OR;  Service: Radiology;  Laterality: N/A;   RIGHT HEART CATH AND CORONARY ANGIOGRAPHY N/A 12/04/2020   Procedure: RIGHT HEART CATH AND CORONARY ANGIOGRAPHY;  Surgeon: Kathleene Hazel, MD;  Location: [facility] INVASIVE CV LAB;  Service: Cardiovascular;  Laterality: N/A;   TRANSCATHETER AORTIC VALVE REPLACEMENT, TRANSFEMORAL N/A 01/19/2021   Procedure: TRANSCATHETER AORTIC VALVE REPLACEMENT, TRANSFEMORAL USING A EDWARDS VALVE.;  Surgeon: Kathleene Hazel, MD;  Location: [facility] OR;  Service: Open Heart Surgery;  Laterality: N/A;   HPI:  Pt is a 62 y.o. male who presented on 06/17/22 for revision of cranioplasty due to CSF leak. 4/20 Seizure. PMH:  left SDH with apraxia/aphasia (09/22/21; s/p Onyx embolization of left middle meningeal artery 09/28/21 with acute/subacute SDH 10/05/21 requiring craniotomy for left SDH evacuation and craniectomy for right epidural/SDH 10/07/21; cranioplasty with custom plate 16/10/96), anemia, bicuspid aortic valve, CAD, enlarged aorta, ESRD on peritoneal dialysis, glaucoma, gout, hypercholesterolemia, HTN, mild aortic stenosis, PAF, s/p TAVR, SDH, sleep apnea..   Assessment / Plan / Recommendation Clinical Impression  SLP administered subtests of the Cognistat as part of an evaluation of cognitive-linguistic function. He has made marked gains since last SLP  documentation last year, and pt and spouse agree that he had returned to normal by September 2023. He performed [employer] on all portions of the Cognistat tested, and demonstrates fluent and clear verbal communication during spontaneous conversation. Pt and wife acknowledge adequate performance noted during evaluation today, saying that this appears to be close to his norm, but they also note that his cognition and communication have both been fluctuating since he has been here. Suspect that SLP testing was done in an opportune moment, but that pt is not at his baseline level of function. He appears to be functional for this acute level of care, but highly recommend SLP cognitive-linguistic evaluation at next level of care to test for higher level function once he has stabilized a little more.    SLP Assessment  SLP Recommendation/Assessment: All further Speech Lanaguage Pathology  needs can be addressed in the next venue of care SLP Visit Diagnosis: Cognitive communication deficit (R41.841)    Recommendations for follow up therapy are one component of a multi-disciplinary discharge planning process, led by the attending physician.  Recommendations may be updated based on patient status, additional functional criteria and insurance authorization.    Follow Up Recommendations  Acute inpatient rehab (3hours/day)    Assistance Recommended at Discharge  Intermittent Supervision/Assistance  Functional Status Assessment Patient has had a recent  decline in their functional status and demonstrates the ability to make significant improvements in function in a reasonable and predictable amount of time.  Frequency and Duration           SLP Evaluation Cognition  Overall Cognitive Status: Within Functional Limits for tasks assessed       Comprehension  Auditory Comprehension Overall Auditory Comprehension: Appears within functional limits for tasks assessed    Expression Expression Primary Mode of  Expression: Verbal Verbal Expression Overall Verbal Expression: Appears within functional limits for tasks assessed   Oral / Motor  Motor Speech Overall Motor Speech: Appears within functional limits for tasks assessed            Mahala Menghini., M.A. CCC-SLP Acute Rehabilitation Services Office [phone]  Secure chat preferred  06/21/2022, 1:25 PM

## 2022-06-21 NOTE — Progress Notes (Signed)
Patient in recline position in recliner.Refused to be weight and refused dressing.Patient's in the patient's room" she said he had a rough day".Patient's nurse made aware.

## 2022-06-21 NOTE — Progress Notes (Signed)
PT Cancellation Note  Patient Details Name: WHITMAN MEINHARDT MRN: [id number] DOB: Dec 25, 1960   Cancelled Treatment:    Reason Eval/Treat Not Completed: Other (comment). Pt vomiting upon PT arrival. Pt given trash can and RN called to room to address patient. Acute PT to return as able, as appropriate to progress mobility.  Lewis Shock, PT, DPT Acute Rehabilitation Services Secure chat preferred Office #: [phone]    Iona Hansen 06/21/2022, 1:26 PM

## 2022-06-22 ENCOUNTER — Encounter (HOSPITAL_COMMUNITY): Payer: Self-pay | Admitting: Neurosurgery

## 2022-06-22 LAB — CBC
HCT: 31.9 % — ABNORMAL LOW (ref 39.0–52.0)
Hemoglobin: 10.3 g/dL — ABNORMAL LOW (ref 13.0–17.0)
MCH: 32.8 pg (ref 26.0–34.0)
MCHC: 32.3 g/dL (ref 30.0–36.0)
MCV: 101.6 fL — ABNORMAL HIGH (ref 80.0–100.0)
Platelets: 96 10*3/uL — ABNORMAL LOW (ref 150–400)
RBC: 3.14 MIL/uL — ABNORMAL LOW (ref 4.22–5.81)
RDW: 14 % (ref 11.5–15.5)
WBC: 7.6 10*3/uL (ref 4.0–10.5)
nRBC: 0 % (ref 0.0–0.2)

## 2022-06-22 LAB — RENAL FUNCTION PANEL
Albumin: 2.5 g/dL — ABNORMAL LOW (ref 3.5–5.0)
Anion gap: 15 (ref 5–15)
BUN: 82 mg/dL — ABNORMAL HIGH (ref 8–23)
CO2: 24 mmol/L (ref 22–32)
Calcium: 9.2 mg/dL (ref 8.9–10.3)
Chloride: 103 mmol/L (ref 98–111)
Creatinine, Ser: 13.86 mg/dL — ABNORMAL HIGH (ref 0.61–1.24)
GFR, Estimated: 4 mL/min — ABNORMAL LOW (ref >60)
Glucose, Bld: 98 mg/dL (ref 70–99)
Phosphorus: 8.4 mg/dL — ABNORMAL HIGH (ref 2.5–4.6)
Potassium: 4.7 mmol/L (ref 3.5–5.1)
Sodium: 142 mmol/L (ref 135–145)

## 2022-06-22 MED ORDER — LEVETIRACETAM 500 MG PO TABS
500.0000 mg | ORAL_TABLET | Freq: Two times a day (BID) | ORAL | Status: DC
  Administered 2022-06-22 – 2022-06-25 (×6): 500 mg via ORAL
  Filled 2022-06-22 (×6): qty 1

## 2022-06-22 NOTE — Progress Notes (Signed)
[facility] Progress Note  Subjective: pt resting-  wife said he threw up this AM.  Removed maybe a liter with PD overnight.  They think high weights are wrong-  no PD issues   Vitals:   06/22/22 0500 06/22/22 0600 06/22/22 0700 06/22/22 0800  BP: 109/61 108/81 130/79   Pulse: (!) 58 (!) 58 74   Resp: Temp:    98 F (36.7 C)  TempSrc:    Oral  SpO2: 95% 96% 90%   Weight:      Height:        Exam: General: more alert and fully Ox 3 HEENT: anicteric sclera, MMM CV: normal rate, no murmurs, no edema Lungs: bilateral chest rise, normal wob Abd: PD cath c/d/I, soft, non-tender, non-distended Skin: no visible lesions or rashes Neuro: lethargic/drowsy responds to tactile/vocal stim Dialysis access: PD cath    OP PD: Dr Marisue Humble, [facility], 5 cycles, fill 3000, 1.5h dwell, 2500cc day bag, dry wt 84kg -  uses 1/2 and 1/2 1's and 2's  Assessment/ Plan  Cranioplasty - as complication of a fall/ SDH 08/2021 that required surgical intervention. 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 Cont nightly PD while here-  keep on home prescription.  Volume/ hypertension -  weight up-  used all 2.5 % overnight on 4/21. Also on labetalol-  and torsemide ?  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 Inpatient medications:  allopurinol  300 mg Oral Daily    atorvastatin  40 mg Oral Daily   calcitRIOL  0.25 mcg Oral Daily   Chlorhexidine Gluconate Cloth  6 each Topical Daily   ferric citrate  630 mg Oral TID WC   gabapentin  100 mg Oral QHS   gentamicin cream  1 Application Topical Daily   heparin injection (subcutaneous)  5,000 Units Subcutaneous Q8H   labetalol  100 mg Oral BID   loratadine  10 mg Oral Daily   pantoprazole  40 mg Oral QHS   torsemide  100 mg Oral Daily     ceFAZolin (ANCEF) IV Stopped (06/21/22 1145)   dialysis solution 1.5% low-MG/low-CA     dialysis solution 2.5% low-MG/low-CA     levETIRAcetam Stopped (06/21/22 2146)   acetaminophen **OR** acetaminophen, bisacodyl, HYDROcodone-acetaminophen, labetalol, morphine injection, naLOXone (NARCAN)  injection, ondansetron **OR** ondansetron (ZOFRAN) IV, mouth rinse, promethazine

## 2022-06-22 NOTE — Progress Notes (Signed)
Patient able to ambulate x2 @ 0945 and 1300 without incidence. Patient ambulated with therapy 1500, sudden episode of nausea and vomiting; received  IV zofran. Remains A&Ox4, pupils 3 round brisk, no motor change.

## 2022-06-22 NOTE — Progress Notes (Signed)
Physical Therapy Treatment Patient Details Name: Daniel Solomon MRN: [id number] DOB: 09/06/1960 Today's Date: 06/22/2022   History of Present Illness Pt is a 62 y.o. male who presented on 06/17/22 for revision of cranioplasty due to CSF leak.   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  4/20 Seizure.    PT Comments    Patient limited this session due to N&V in hallway with ambulation.  Had already been for two walks earlier today and had called for assistance back to bed, but eager to do what he needs to get home so agreeable.  Walked with RW this session about 47' prior to onset of symptoms.  RN in to assist and medicate.  Continue to feel he will benefit from interdisciplinary intensive inpatient rehab prior to d/c home.    Recommendations for follow up therapy are one component of a multi-disciplinary discharge planning process, led by the attending physician.  Recommendations may be updated based on patient status, additional functional criteria and insurance authorization.  Follow Up Recommendations       Assistance Recommended at Discharge Intermittent Supervision/Assistance  Patient can return home with the following A little help with walking and/or transfers;A little help with bathing/dressing/bathroom;Assistance with cooking/housework;Direct supervision/assist for medications management;Direct supervision/assist for financial management;Assist for transportation;Help with stairs or ramp for entrance   Equipment Recommendations  Other (comment) (TBA)    Recommendations for Other Services        Precautions / Restrictions Precautions Precautions: Fall Precaution Comments: Seizure 4/20 Restrictions Other Position/Activity Restrictions: lumbar drain     Mobility  Bed Mobility Overal bed mobility: Needs Assistance Bed Mobility: Sit to Supine       Sit to supine: Min guard   General bed mobility comments: back to bed after ambulation with + N&V    Transfers Overall transfer level: Needs assistance Equipment used: Rolling walker (2 wheels) Transfers: Sit to/from Stand Sit to Stand: Min guard   Step pivot transfers: Min assist       General transfer comment: assist for safety with lines initial sit to stand, step pivot to wheelchair from desk chair in hallway and to bed from wheelchair with min A due to pt not feeling well    Ambulation/Gait Ambulation/Gait assistance: Supervision, Min guard Gait Distance (Feet): 80 Feet Assistive device: Rolling walker (2 wheels) Gait Pattern/deviations: Step-through pattern, Decreased stride length, Shuffle       General Gait Details: decreased speed and shuffling pattern; RN reported he had walked 2x short lap in am and 2x large loop in pm; pt reported not feeling well after about 80' so placed desk chair for rest, then pt with N&V and RN in to provide meds and assisted to room in wheelchair   Stairs             Wheelchair Mobility    Modified Rankin (Stroke Patients Only)       Balance Overall balance assessment: Needs assistance   Sitting balance-Leahy Scale: Good       Standing balance-Leahy Scale: Fair Standing balance comment: static standing without UE support, prefers walker for ambulation  Cognition Arousal/Alertness: Awake/alert Behavior During Therapy: Flat affect Overall Cognitive Status: Impaired/Different from baseline Area of Impairment: Following commands, Attention, Safety/judgement, Problem solving                   Current Attention Level:  Sustained   Following Commands: Follows one step commands consistently, Follows one step commands with increased time Safety/Judgement: Decreased awareness of safety, Decreased awareness of deficits   Problem Solving: Slow processing General Comments: cues to wait for lines untangled prior to ambulation, slower to respond at times to commands, patient initated balance activity but unable to complete due to N&V        Exercises      General Comments General comments (skin integrity, edema, etc.): RN in room to assist with settling pt due to N&V during session; wife in the room as well      Pertinent Vitals/Pain Pain Assessment Pain Assessment: Faces Faces Pain Scale: No hurt    Home Living                          Prior Function            PT Goals (current goals can now be found in the care plan section) Progress towards PT goals: Progressing toward goals    Frequency    Min 3X/week      PT Plan Current plan remains appropriate    Co-evaluation              AM-PAC PT "6 Clicks" Mobility   Outcome Measure  Help needed turning from your back to your side while in a flat bed without using bedrails?: A Little Help needed moving from lying on your back to sitting on the side of a flat bed without using bedrails?: A Little Help needed moving to and from a bed to a chair (including a wheelchair)?: A Little Help needed standing up from a chair using your arms (e.g., wheelchair or bedside chair)?: A Little Help needed to walk in hospital room?: A Little Help needed climbing 3-5 steps with a railing? : Total 6 Click Score: 16    End of Session Equipment Utilized During Treatment: Gait belt Activity Tolerance: Treatment limited secondary to medical complications (Comment) Patient left: in bed   PT Visit Diagnosis: Unsteadiness on feet (R26.81);Hemiplegia and hemiparesis;Difficulty in walking, not elsewhere classified (R26.2) Hemiplegia - Right/Left:  Right Hemiplegia - dominant/non-dominant: Dominant Hemiplegia - caused by: Other Nontraumatic intracranial hemorrhage     Time: 1445-1500 PT Time Calculation (min) (ACUTE ONLY): 15 min  Charges:  $Gait Training: 8-22 mins                     Daniel Solomon, PT Acute Rehabilitation Services Office:[phone] 06/23/2022    Daniel Solomon 06/22/2022, 5:43 PM

## 2022-06-22 NOTE — Progress Notes (Signed)
Inpatient Rehab Admissions Coordinator:   CIR continues to follow for potential admit once medically ready. I do have insurance auth for admit.   Megan Salon, MS, CCC-SLP Rehab Admissions Coordinator  [phone] (celll) [phone] (office)

## 2022-06-22 NOTE — Progress Notes (Signed)
Patient ID: Daniel Solomon, male   DOB: [birth year], 62 y.o.   MRN: [id number] BP 130/86 (BP Location: Right Arm)   Pulse 80   Temp (S) 97.8 F (36.6 C) (Oral) Comment: Pre PD tx VS check  Resp 18   Ht  (1.727 m)   Wt 88.9 kg   SpO2 98%   BMI 29.80 kg/m  Alert, oriented to person, place, situation. If you have an extended conversation confusion and memory lapses are apparent.  Moving all extremities, wound is healing well Probable shunt placement

## 2022-06-22 NOTE — Progress Notes (Signed)
OT Cancellation Note  Patient Details Name: Daniel Solomon MRN: [id number] DOB: 06-20-1960   Cancelled Treatment:    Reason Eval/Treat Not Completed: Other (comment);Medical issues which prohibited therapy Patient had just worked with PT and had a sudden bout of nausea and vomiting. RN requesting OT hold at this time until patient can participate more. OT will continue to follow.   Pollyann Glen E. Yelena Metzer, OTR/L Acute Rehabilitation Services [phone]   Cherlyn Cushing 06/22/2022, 3:54 PM

## 2022-06-23 MED ORDER — DELFLEX-LC/2.5% DEXTROSE 394 MOSM/L IP SOLN: INTRAPERITONEAL | Status: DC

## 2022-06-23 MED ORDER — GENTAMICIN SULFATE 0.1 % EX CREA
1.0000 | TOPICAL_CREAM | Freq: Every day | CUTANEOUS | Status: DC
  Filled 2022-06-23: qty 15

## 2022-06-23 NOTE — Progress Notes (Signed)
[facility] Progress Note  Subjective: pt resting-  wife says no new issues.   Removed maybe a liter with PD overnight.  They think high weights are wrong-  no PD issues   Vitals:   06/23/22 0400 06/23/22 0500 06/23/22 0600 06/23/22 0800  BP: (!) 139/92 112/70 117/78 128/72  Pulse: 65 (!) 57 66 72  Resp: Temp: 97.8 F (36.6 C)   (!) 97.5 F (36.4 C)  TempSrc: Axillary   Axillary  SpO2: 100% 97% 96% 95%  Weight:      Height:        Exam: General: more alert and fully Ox 3 HEENT: anicteric sclera, MMM CV: normal rate, no murmurs, no edema Lungs: bilateral chest rise, normal wob Abd: PD cath c/d/I, soft, non-tender, non-distended Skin: no visible lesions or rashes Neuro: lethargic/drowsy responds to tactile/vocal stim Dialysis access: PD cath    OP PD: Dr Marisue Humble, [facility], 5 cycles, fill 3000, 1.5h dwell, 2500cc day bag, dry wt 84kg -  uses 1/2 and 1/2 1's and 2's  Assessment/ Plan  Cranioplasty - as complication of a fall/ SDH 08/2021 that required surgical intervention. 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 Cont nightly PD while here-  keep on home prescription. Check labs again on Sunday Volume/ hypertension -  weight up-  used all 2.5 % overnight on 4/21. Also on labetalol-  and torsemide with some UOP so will keep.     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 Inpatient medications:  allopurinol  300 mg Oral Daily   atorvastatin  40 mg Oral Daily   calcitRIOL  0.25 mcg Oral Daily   Chlorhexidine Gluconate Cloth  6 each Topical Daily   ferric citrate  630 mg Oral TID WC   gabapentin  100 mg Oral QHS   gentamicin cream  1 Application Topical Daily   heparin injection (subcutaneous)  5,000 Units Subcutaneous Q8H   labetalol  100 mg Oral BID   levETIRAcetam  500 mg Oral BID   loratadine  10 mg Oral Daily   pantoprazole  40 mg Oral QHS   torsemide  100 mg Oral Daily     ceFAZolin (ANCEF) IV Stopped (06/22/22 1115)   dialysis solution 1.5% low-MG/low-CA     dialysis solution 2.5% low-MG/low-CA     acetaminophen **OR** acetaminophen, bisacodyl, HYDROcodone-acetaminophen, labetalol, morphine injection, naLOXone (NARCAN)  injection, ondansetron **OR** ondansetron (ZOFRAN) IV, mouth rinse, promethazine

## 2022-06-23 NOTE — Progress Notes (Signed)
Patient was repositioning in bed when the lumbar drain tubing became disconnected from the catheter of the drain. The lumbar drain had been clamped before this occurred. RN paged on call neurosurgeon to make them aware and ask what needed to be done. Neurosurgeon on call told RN to clamp end of catheter with hemostats until it was assessed during the day.

## 2022-06-23 NOTE — Progress Notes (Signed)
PT Cancellation Note  Patient Details Name: Daniel Solomon MRN: [id number] DOB: December 28, 1960   Cancelled Treatment:    Reason Eval/Treat Not Completed: Other (comment); attempted x 3, initially pt getting meds for nausea, then pt sleeping and MD in speaking with pt and wife on third attempt.  Will continue attempts.    [name] 06/23/2022, 5:45 PM [name], PT Acute Rehabilitation Services [phone] 06/23/2022

## 2022-06-23 NOTE — Progress Notes (Signed)
CCPD completed. Patient was disconnected from cycler using aseptic tech. Catheter clamped and capped.  He denies any issues during or after the treatment. Post B/P 116/78; HR 69; Temp 97.7; O2 sat 97% Effluent with fibrin strands. Pt refuses to have his dressing assessed. Reported it is good., does not need to be changed. Report given to [employer].

## 2022-06-23 NOTE — Progress Notes (Signed)
Patient ID: Daniel Solomon, male   DOB: June 12, 1960, 62 y.o.   MRN: [id number] Drain had been severed, removed.  Will wait until subdural collection is smaller before possible shunting Wound is clean, dry, no signs of infection

## 2022-06-23 NOTE — Procedures (Signed)
PD note  Patient in bed, sleeping.  Only responds minimally to voice.  Wife at bedside and stated that he had just gotten pain medication and has been sleeping soundly.  PD dressing changed and gentamicin cream applied.  Site unremarkable for signs or symptoms of infection.  Patient hooked up to PD cycler without issue.

## 2022-06-23 NOTE — Progress Notes (Signed)
IP rehab admissions - Noted patient may need additional surgical intervention.  Awaiting medical readiness prior to CIR admission.  [phone]

## 2022-06-24 MED ORDER — GENTAMICIN SULFATE 0.1 % EX CREA
1.0000 | TOPICAL_CREAM | Freq: Every day | CUTANEOUS | Status: DC
  Administered 2022-06-25: 1 via TOPICAL
  Filled 2022-06-24: qty 15

## 2022-06-24 MED ORDER — DELFLEX-LC/2.5% DEXTROSE 394 MOSM/L IP SOLN: INTRAPERITONEAL | Status: DC

## 2022-06-24 NOTE — Progress Notes (Signed)
Inpatient Rehab Admissions Coordinator:   CIR following. Per MD note, possible need for shunt placement. Will follow up for potential admit once medically ready. Pt. And wife updated.  Megan Salon, MS, CCC-SLP Rehab Admissions Coordinator  [phone] (celll) [phone] (office)

## 2022-06-24 NOTE — Progress Notes (Signed)
[facility] Progress Note  Subjective: pt resting-  wife says no new issues.   Removed 1400 with PD overnight.  They think high weights are wrong-  no PD issues   Vitals:   06/24/22 0200 06/24/22 0300 06/24/22 0400 06/24/22 0500  BP: 124/76 129/71 (!) 149/128 95/77  Pulse: (!) 56 (!) 50 (!) 53 (!) 50  Resp: (!) 9 16 13 17   Temp:   97.9 F (36.6 C)   TempSrc:   Oral   SpO2: 92% 97% 95% 97%  Weight:      Height:        Exam: General: more alert and fully Ox 3 HEENT: anicteric sclera, MMM CV: normal rate, no murmurs, no edema Lungs: bilateral chest rise, normal wob Abd: PD cath c/d/I, soft, non-tender, non-distended Skin: no visible lesions or rashes Neuro: lethargic/drowsy responds to tactile/vocal stim Dialysis access: PD cath    OP PD: Dr Marisue Humble, [facility], 5 cycles, fill 3000, 1.5h dwell, 2500cc day bag, dry wt 84kg -  uses 1/2 and 1/2 1's and 2's  Assessment/ Plan  Cranioplasty - as complication of a fall/ SDH 08/2021 that required surgical intervention. 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 Cont nightly PD while here-  keep on home prescription. Check labs again on Sunday Volume/ hypertension -  weight up-  used all 2.5 % overnight on 4/21. Also on labetalol-  and torsemide with some UOP so will keep.     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 Inpatient medications:  allopurinol  300 mg Oral Daily   atorvastatin  40 mg Oral Daily   calcitRIOL  0.25 mcg Oral Daily   Chlorhexidine Gluconate Cloth  6 each Topical Daily   ferric citrate  630 mg Oral TID WC   gabapentin  100 mg Oral QHS   gentamicin cream  1 Application Topical Daily   heparin injection (subcutaneous)  5,000 Units Subcutaneous Q8H   labetalol  100 mg Oral BID   levETIRAcetam  500 mg Oral BID   loratadine  10 mg Oral Daily   pantoprazole  40 mg Oral QHS   torsemide  100 mg Oral Daily    dialysis solution 1.5% low-MG/low-CA     dialysis solution 2.5% low-MG/low-CA     dialysis solution 2.5% low-MG/low-CA     acetaminophen **OR** acetaminophen, bisacodyl, HYDROcodone-acetaminophen, labetalol, morphine injection, naLOXone (NARCAN)  injection, ondansetron **OR** ondansetron (ZOFRAN) IV, mouth rinse, promethazine

## 2022-06-24 NOTE — Procedures (Signed)
PD treatment  Treatment concluded and patient taken off the cycler. No issues reported by wife who is at bedside. Patient sleeping soundly, did not respond to being spoken to. Disconnection with no issues. Total UF was 1409 ml

## 2022-06-24 NOTE — Procedures (Signed)
PD Note  Patient awake and alert.  He states that he is having no issues with his PD catheter.  Dressing changed per protocol.  Wife is at bedside.  Cycler started with no issues noted.

## 2022-06-24 NOTE — Progress Notes (Signed)
Patient ID: Daniel Solomon, male   DOB: [birth year], 62 y.o.   MRN: [id number] BP 111/79 (BP Location: Right Arm)   Pulse (!) 50   Temp 98.1 F (36.7 C) (Axillary)   Resp 17   Ht 5\' 8"  (1.727 m)   Wt 88.9 kg   SpO2 97%   BMI 29.80 kg/m  Alert, oriented x 4, speech is clear Moving extremities, left better than right Wound is clean and dry Head CT tomorrow

## 2022-06-24 NOTE — Progress Notes (Signed)
PT Cancellation Note  Patient Details Name: Daniel Solomon MRN: [id number] DOB: 09/13/1960   Cancelled Treatment:    Reason Eval/Treat Not Completed: Fatigue/lethargy limiting ability to participate; just back in bed after walking two laps after which he vomited per spouse.  Gave theraputty OT issued.  Wife reports plans for MD to reassess over the weekend RE shunt.  Encouraged continued hallway ambulation.  Will check back Monday.   [name] 06/24/2022, 3:04 PM [name], PT Acute Rehabilitation Services Office:[phone] 06/24/2022

## 2022-06-24 NOTE — Progress Notes (Signed)
Occupational Therapy Treatment Patient Details Name: Daniel Solomon MRN: [id number] DOB: 11-06-1960 Today's Date: 06/24/2022   History of present illness Pt is a 62 y.o. male who presented on 06/17/22 for revision of cranioplasty due to CSF leak.   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  4/20 Seizure.   OT comments  Patient continues to make steady progress towards goals in skilled OT session. Patient's session encompassed vision and cognition, and completing upper level cognitive tasks. Patient completing visual task and clock worksheet, patient needing longer to complete task than anticipated average for patient (but all of patient answers were correct), however is also stating difficulty with fine motor coordination therefore hard to discern cognition vs FMC. Patient complaining of 3/10 headache at beginning of session, progressing to 4/10 headache at end of session. OT also to provide theraputty exercises to improve strength for RUE next session. OT will continue to follow.   Recommendations for follow up therapy are one component of a multi-disciplinary discharge planning process, led by the attending physician.  Recommendations may be updated based on patient status, additional functional criteria and insurance authorization.    Assistance Recommended at Discharge Frequent or constant Supervision/Assistance  Patient can return home with the following  Help with stairs or ramp for entrance;A lot of help with bathing/dressing/bathroom;A little help with walking and/or transfers;Assist for  transportation;Assistance with cooking/housework;Direct supervision/assist for financial management;Direct supervision/assist for medications management   Equipment Recommendations  Other (comment) (will continue to assess)    Recommendations for Other Services      Precautions / Restrictions Precautions Precautions: Fall Precaution Comments: Seizure 4/20       Mobility Bed Mobility Overal bed mobility: Needs Assistance Bed Mobility: Supine to Sit, Sit to Supine     Supine to sit: Min guard Sit to supine: Min guard   General bed mobility comments: min gaurd for safety, returned back to bed after headache    Transfers Overall transfer level: Needs assistance Equipment used: 1 person hand held assist Transfers: Sit to/from Stand Sit to Stand: Min guard           General transfer comment: min gaurd for safety to transition to chair and back     Balance Overall balance assessment: Needs assistance Sitting-balance support: Feet supported, Single extremity supported Sitting balance-Leahy Scale: Good       Standing balance-Leahy Scale: Fair Standing balance comment: static standing without UE support, prefers walker for ambulation                           ADL either performed or assessed with clinical judgement   ADL Overall ADL's : Needs assistance/impaired                                       General ADL Comments: Session focus on vision and cognition, and completing upper level cognitive tasks.    Extremity/Trunk Assessment Upper Extremity Assessment RUE Coordination: decreased fine motor;decreased gross motor            Vision  Perception     Praxis      Cognition Arousal/Alertness: Awake/alert Behavior During Therapy: Flat affect Overall Cognitive Status: Impaired/Different from baseline Area of Impairment: Problem solving                               General Comments: Patient completing  visual task and clock worksheet, patient needing longer to complete task than anticipated average for patient, however is also stating difficulty with fine motor coordination therefore hard to discern cognition vs FMC.        Exercises      Shoulder Instructions       General Comments      Pertinent Vitals/ Pain       Pain Assessment Pain Assessment: 0-10 Pain Score: 4  Pain Location: headache Pain Descriptors / Indicators: Grimacing, Guarding Pain Intervention(s): Limited activity within patient's tolerance, Monitored during session, Repositioned  Home Living                                          Prior Functioning/Environment              Frequency  Min 2X/week        Progress Toward Goals  OT Goals(current goals can now be found in the care plan section)  Progress towards OT goals: Progressing toward goals  Acute Rehab OT Goals Patient Stated Goal: to get rid of this headache OT Goal Formulation: With patient Time For Goal Achievement: 07/01/22 Potential to Achieve Goals: Good  Plan Discharge plan remains appropriate    Co-evaluation                 AM-PAC OT "6 Clicks" Daily Activity     Outcome Measure   Help from another person eating meals?: A Little Help from another person taking care of personal grooming?: A Little Help from another person toileting, which includes using toliet, bedpan, or urinal?: A Little Help from another person bathing (including washing, rinsing, drying)?: A Lot Help from another person to put on and taking off regular upper body clothing?: A Lot Help from another person to put on and taking off regular lower body clothing?: A Lot 6 Click Score: 15    End of Session    OT Visit Diagnosis: Unsteadiness on feet (R26.81);Muscle weakness (generalized) (M62.81);Ataxia, unspecified (R27.0);Other symptoms and signs involving cognitive function   Activity Tolerance Patient limited by fatigue    Patient Left in bed;with call bell/phone within reach;with family/visitor present   Nurse Communication Mobility status        Time: 1610-9604 OT Time Calculation (min): 23 min  Charges: OT General Charges $OT Visit: 1 Visit OT Treatments $Self Care/Home Management : 23-37 mins  Pollyann Glen E. Bellanie Matthew, OTR/L Acute Rehabilitation Services [phone]   Cherlyn Cushing 06/24/2022, 3:27 PM

## 2022-06-25 ENCOUNTER — Inpatient Hospital Stay (HOSPITAL_COMMUNITY): Payer: Managed Care, Other (non HMO)

## 2022-06-25 MED ORDER — DELFLEX-LC/1.5% DEXTROSE 344 MOSM/L IP SOLN: INTRAPERITONEAL | Status: DC

## 2022-06-25 MED ORDER — LEVETIRACETAM 500 MG PO TABS
500.0000 mg | ORAL_TABLET | Freq: Every morning | ORAL | Status: DC
  Administered 2022-06-26: 500 mg via ORAL
  Filled 2022-06-25: qty 1

## 2022-06-25 NOTE — Progress Notes (Signed)
[facility] Progress Note  Subjective: pt resting-  wife says no new issues.   Removed 1500 with PD overnight.  They think high weights are wrong-  no PD issues   Vitals:   06/25/22 0400 06/25/22 0425 06/25/22 0500 06/25/22 0600  BP:  126/85 128/89 (!) 144/84  Pulse: (!) 56 (!) 54 60 (!) 53  Resp: 16 (!) 9 10 13   Temp: 97.8 F (36.6 C)     TempSrc: Axillary     SpO2: 97% 97% 97% 100%  Weight:      Height:        Exam: General: more alert and fully Ox 3 HEENT: anicteric sclera, MMM CV: normal rate, no murmurs, no edema Lungs: bilateral chest rise, normal wob Abd: PD cath c/d/I, soft, non-tender, non-distended Skin: no visible lesions or rashes Neuro: normal Dialysis access: PD cath    OP PD: Dr Marisue Humble, [facility], 5 cycles, fill 3000, 1.5h dwell, 2500cc day bag, dry wt 84kg -  uses 1/2 and 1/2 1's and 2's  Assessment/ Plan  Cranioplasty - as complication of a fall/ SDH 08/2021 that required surgical intervention. 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 Cont nightly PD while here-  keep on home prescription. Check labs again on Sunday Volume/ hypertension -  weight up-  used all 2.5 % overnight on 4/21. Also on labetalol-  and torsemide with some UOP so will keep.     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 Inpatient medications:  allopurinol  300 mg Oral Daily   atorvastatin   40 mg Oral Daily   calcitRIOL  0.25 mcg Oral Daily   Chlorhexidine Gluconate Cloth  6 each Topical Daily   ferric citrate  630 mg Oral TID WC   gabapentin  100 mg Oral QHS   gentamicin cream  1 Application Topical Daily   heparin injection (subcutaneous)  5,000 Units Subcutaneous Q8H   labetalol  100 mg Oral BID   levETIRAcetam  500 mg Oral BID   loratadine  10 mg Oral Daily   pantoprazole  40 mg Oral QHS   torsemide  100 mg Oral Daily    dialysis solution 1.5% low-MG/low-CA     dialysis solution 2.5% low-MG/low-CA     dialysis solution 2.5% low-MG/low-CA     dialysis solution 2.5% low-MG/low-CA     acetaminophen **OR** acetaminophen, bisacodyl, HYDROcodone-acetaminophen, labetalol, morphine injection, naLOXone (NARCAN)  injection, ondansetron **OR** ondansetron (ZOFRAN) IV, mouth rinse, promethazine

## 2022-06-25 NOTE — Progress Notes (Signed)
   Providing Compassionate, Quality Care - Together  NEUROSURGERY PROGRESS NOTE   S: No issues overnight.  Doing well  O: EXAM:  BP 131/73 (BP Location: Right Arm)   Pulse 69   Temp 97.8 F (36.6 C) (Axillary)   Resp 15   Ht 5\' 8"  (1.727 m)   Wt 88.9 kg   SpO2 96%   BMI 29.80 kg/m   Awake, alert, oriented x 3 PERRL Speech fluent, appropriate  CNs grossly intact  Moving all extremities Incision clean dry and intact without erythema or drainage  ASSESSMENT:  62 y.o. male with   Status post left cranioplasty  PLAN: -Overall doing well, no new complaints -Continue monitoring -CT brain pending, possible shunt placement per Dr. Franky Macho    Thank you for allowing me to participate in this patient's care.  Please do not hesitate to call with questions or concerns.   Monia Pouch, DO Neurosurgeon [facility] Cell: [phone]

## 2022-06-26 LAB — CBC
HCT: 35.6 % — ABNORMAL LOW (ref 39.0–52.0)
Hemoglobin: 11.9 g/dL — ABNORMAL LOW (ref 13.0–17.0)
MCH: 33.3 pg (ref 26.0–34.0)
MCHC: 33.4 g/dL (ref 30.0–36.0)
MCV: 99.7 fL (ref 80.0–100.0)
Platelets: 119 10*3/uL — ABNORMAL LOW (ref 150–400)
RBC: 3.57 MIL/uL — ABNORMAL LOW (ref 4.22–5.81)
RDW: 14 % (ref 11.5–15.5)
WBC: 8.1 10*3/uL (ref 4.0–10.5)
nRBC: 0 % (ref 0.0–0.2)

## 2022-06-26 LAB — RENAL FUNCTION PANEL
Albumin: 3 g/dL — ABNORMAL LOW (ref 3.5–5.0)
Anion gap: 19 — ABNORMAL HIGH (ref 5–15)
BUN: 77 mg/dL — ABNORMAL HIGH (ref 8–23)
CO2: 24 mmol/L (ref 22–32)
Calcium: 9.8 mg/dL (ref 8.9–10.3)
Chloride: 100 mmol/L (ref 98–111)
Creatinine, Ser: 16.09 mg/dL — ABNORMAL HIGH (ref 0.61–1.24)
GFR, Estimated: 3 mL/min — ABNORMAL LOW (ref >60)
Glucose, Bld: 110 mg/dL — ABNORMAL HIGH (ref 70–99)
Phosphorus: 9.2 mg/dL — ABNORMAL HIGH (ref 2.5–4.6)
Potassium: 4.7 mmol/L (ref 3.5–5.1)
Sodium: 143 mmol/L (ref 135–145)

## 2022-06-26 NOTE — Discharge Summary (Signed)
Physician Discharge Summary  Patient ID: Daniel Solomon MRN: [id number] DOB/AGE: [birth year] 61 y.o.  Admit date: 06/17/2022 Discharge date: 06/26/2022  Admission Diagnoses:external hydrocephalus  Discharge Diagnoses: external hydrocephalus Principal Problem:   Skull defect Active Problems:   Postoperative CSF leak   Discharged Condition: fair  Hospital Course: Mr. Shaheen was admitted and taken to the operating secondary to csf under the scalp flap. Also the cranioplasty appeared to have loose screws and plates. I took him to the operating room and found a large amount of csf under the flap, and loose screws. I replaced the bone flap and prior to that placed a piece of duragen over the brain. I also placed a lumbar drain. I drained approximately 50 cc which resulted in pnuemocephalus. He also had both an extracranial and subdural collection of csf.  Repeat CTs have shown a small reduction in the subdural and extracranial fluid. He also had a small contusion in the left frontal lobe.  I will discharge him with some clumsiness in his right hand, mild balance issues when ambulating. He will return to the office in 10 days with a repeat head CT. I am planning on a vp shunt once the subdural fluid decreases. His wound is clean, dry, and without signs of infection.   Treatments: surgery: cranioplasty revision  Discharge Exam: Blood pressure 123/75, pulse 72, temperature 98.8 F (37.1 C), temperature source Oral, resp. rate 14, height 5\' 8"  (1.727 m), weight 88.9 kg, SpO2 92 %. General appearance: alert, cooperative, and mild distress  Disposition: Discharge disposition: 01-Home or Self Care      Skull defect  Allergies as of 06/26/2022   No Known Allergies      Medication List     TAKE these medications    allopurinol 300 MG tablet Commonly known as: ZYLOPRIM TAKE 1 TABLET BY MOUTH EVERY DAY   atorvastatin 40 MG tablet Commonly known as: LIPITOR TAKE 1 TABLET BY MOUTH EVERY  DAY   Auryxia 1 GM 210 MG(Fe) tablet Generic drug: ferric citrate Take 630 mg by mouth 3 (three) times daily with meals.   calcitRIOL 0.25 MCG capsule Commonly known as: ROCALTROL Take 0.25 mcg by mouth daily.   gabapentin 100 MG capsule Commonly known as: NEURONTIN Take 100 mg by mouth at bedtime.   gentamicin cream 0.1 % Commonly known as: GARAMYCIN Apply 1 Application topically daily.   HYDROcodone-acetaminophen 5-325 MG tablet Commonly known as: NORCO/VICODIN Take 1 tablet by mouth every 6 (six) hours as needed for severe pain. 1-2 tabs po tid prn   labetalol 100 MG tablet Commonly known as: NORMODYNE Take 100 mg by mouth 2 (two) times daily.   levETIRAcetam 250 MG tablet Commonly known as: Keppra Take 1 tablet (250 mg total) by mouth 2 (two) times daily.   levETIRAcetam 500 MG tablet Commonly known as: KEPPRA Take 1 tablet (500 mg total) by mouth 2 (two) times daily.   loratadine 10 MG tablet Commonly known as: CLARITIN Take 10 mg by mouth daily.   methocarbamol 500 MG tablet Commonly known as: ROBAXIN Take 2 tablets (1,000 mg total) by mouth 4 (four) times daily as needed (for Headache). START with 1 pill, if not too drowsy take 2 pills.   MIRCERA IJ Inject into the skin as needed.   NON FORMULARY Pt uses a cpap nightly   torsemide 100 MG tablet Commonly known as: DEMADEX Take 1 tablet (100 mg total) by mouth daily.   Xphozah 30 MG Tabs Generic drug: Tenapanor  HCl (CKD) Take 30 mg by mouth in the morning and at bedtime.        Follow-up Information     Coletta Memos, MD Follow up.   Specialty: Neurosurgery Why: will need a CT before visit, please call the office Contact information: [address] [place] [address] [phone]                 Signed: Coletta Memos 06/26/2022, 5:06 PM

## 2022-06-26 NOTE — Progress Notes (Signed)
[facility] Progress Note  Subjective: no new issues.   Removed 1500 with PD overnight.  No shunt planned for in the immediate future  Vitals:   06/25/22 2338 06/26/22 0300 06/26/22 0437 06/26/22 0800  BP: 135/84  123/75   Pulse: 71  72   Resp:   14   Temp:  97.8 F (36.6 C)  97.8 F (36.6 C)  TempSrc:  Oral  Oral  SpO2: 100%  92%   Weight:      Height:        Exam: General: more alert and fully Ox 3 HEENT: anicteric sclera, MMM CV: normal rate, no murmurs, no edema Lungs: bilateral chest rise, normal wob Abd: PD cath c/d/I, soft, non-tender, non-distended Skin: no visible lesions or rashes Neuro: normal Dialysis access: PD cath    OP PD: Dr Marisue Solomon, [facility], 5 cycles, fill 3000, 1.5h dwell, 2500cc day bag, dry wt 84kg -  uses 1/2 and 1/2 1's and 2's  Assessment/ Plan  Cranioplasty - as complication of a fall/ SDH 08/2021 that required surgical intervention. 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 Cont nightly PD while here-  keep on home prescription.  Volume/ hypertension -  weight up-  used all 2.5 % overnight on 4/21. Also on labetalol-  and torsemide with some UOP so will keep.     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 Inpatient medications:  allopurinol  300 mg Oral Daily   atorvastatin  40 mg Oral Daily   calcitRIOL  0.25 mcg Oral  Daily   Chlorhexidine Gluconate Cloth  6 each Topical Daily   ferric citrate  630 mg Oral TID WC   gabapentin  100 mg Oral QHS   gentamicin cream  1 Application Topical Daily   heparin injection (subcutaneous)  5,000 Units Subcutaneous Q8H   labetalol  100 mg Oral BID   levETIRAcetam  500 mg Oral q morning   loratadine  10 mg Oral Daily   pantoprazole  40 mg Oral QHS   torsemide  100 mg Oral Daily    dialysis solution 1.5% low-MG/low-CA     dialysis solution 2.5% low-MG/low-CA     acetaminophen **OR** acetaminophen, bisacodyl, HYDROcodone-acetaminophen, labetalol, morphine injection, naLOXone (NARCAN)  injection, ondansetron **OR** ondansetron (ZOFRAN) IV, mouth rinse, promethazine

## 2022-06-26 NOTE — Progress Notes (Signed)
Pt d/c to home by car with family. Assessment stable. D/C instructions reviewed and all questions answered. 

## 2022-06-27 ENCOUNTER — Telehealth: Payer: Self-pay

## 2022-06-27 NOTE — Transitions of Care (Post Inpatient/ED Visit) (Signed)
   06/27/2022  Name: Daniel Solomon MRN: [id number] DOB: 1960/11/20  Today's TOC FU Call Status: Today's TOC FU Call Status:: Successful TOC FU Call Competed TOC FU Call Complete Date: 06/27/22  Transition Care Management Follow-up Telephone Call Date of Discharge: 06/26/22 Discharge Facility: [facility]) Type of Discharge: Inpatient Admission Primary Inpatient Discharge Diagnosis:: external hydrocephalus How have you been since you were released from the hospital?: Better Any questions or concerns?: No  Items Reviewed: Did you receive and understand the discharge instructions provided?: Yes Medications obtained and verified?: Yes (Medications Reviewed) Any new allergies since your discharge?: No Dietary orders reviewed?: NA Do you have support at home?: Yes  Home Care and Equipment/Supplies: Were Home Health Services Ordered?: NA Any new equipment or medical supplies ordered?: NA  Functional Questionnaire: Do you need assistance with bathing/showering or dressing?: No Do you need assistance with meal preparation?: No Do you need assistance with eating?: No Do you have difficulty maintaining continence: No Do you need assistance with getting out of bed/getting out of a chair/moving?: No Do you have difficulty managing or taking your medications?: No  Follow up appointments reviewed: PCP Follow-up appointment confirmed?: NA Specialist Hospital Follow-up appointment confirmed?: No Reason Specialist Follow-Up Not Confirmed: Patient has Specialist Provider Number and will Call for Appointment Do you need transportation to your follow-up appointment?: No Do you understand care options if your condition(s) worsen?: Yes-patient verbalized understanding    SIGNATURE  Agnes Lawrence, CMA (AAMA)  [facility] [phone]

## 2022-07-06 ENCOUNTER — Other Ambulatory Visit: Payer: Self-pay | Admitting: Neurosurgery

## 2022-07-06 DIAGNOSIS — Z9889 Other specified postprocedural states: Secondary | ICD-10-CM

## 2022-07-13 ENCOUNTER — Ambulatory Visit
Admission: RE | Admit: 2022-07-13 | Discharge: 2022-07-13 | Disposition: A | Discharge status: Home / Self Care | Payer: Managed Care, Other (non HMO) | Source: Ambulatory Visit | Attending: Neurosurgery | Admitting: Neurosurgery

## 2022-07-13 DIAGNOSIS — Z9889 Other specified postprocedural states: Secondary | ICD-10-CM

## 2022-09-25 ENCOUNTER — Other Ambulatory Visit: Payer: Self-pay | Admitting: Family Medicine

## 2022-10-27 ENCOUNTER — Other Ambulatory Visit: Payer: Self-pay | Admitting: Cardiovascular Disease

## 2022-11-05 ENCOUNTER — Other Ambulatory Visit: Payer: Self-pay | Admitting: Family Medicine

## 2022-11-28 ENCOUNTER — Other Ambulatory Visit: Payer: Self-pay | Admitting: Neurosurgery

## 2022-11-28 DIAGNOSIS — Z9889 Other specified postprocedural states: Secondary | ICD-10-CM

## 2022-12-02 ENCOUNTER — Ambulatory Visit
Admission: RE | Admit: 2022-12-02 | Discharge: 2022-12-02 | Disposition: A | Discharge status: Home / Self Care | Payer: Managed Care, Other (non HMO) | Source: Ambulatory Visit | Attending: Neurosurgery | Admitting: Neurosurgery

## 2022-12-02 DIAGNOSIS — Z9889 Other specified postprocedural states: Secondary | ICD-10-CM

## 2022-12-27 IMAGING — CT CT ANGIO CHEST
2 of 7 series · 15 of 36 positions shown · IV contrast (APPLIED)
Comparison: None.

CLINICAL DATA: 59-year-old male with history of severe aortic
stenosis. Preprocedural study prior to potential transcatheter
aortic valve replacement (TAVR) procedure.

EXAM:
CT ANGIOGRAPHY CHEST, ABDOMEN AND PELVIS
TECHNIQUE: Multidetector CT imaging through the chest, abdomen and pelvis was
performed using the standard protocol during bolus administration of
intravenous contrast. Multiplanar reconstructed images and MIPs were
obtained and reviewed to evaluate the vascular anatomy.
CONTRAST:  100mL OMNIPAQUE IOHEXOL 350 MG/ML SOLN

[Series 3: ax thins · axial · 0.59mm/px · z∈[+784,+1471]mm · 14 of 793 slices shown]
[im 53/793  lung]
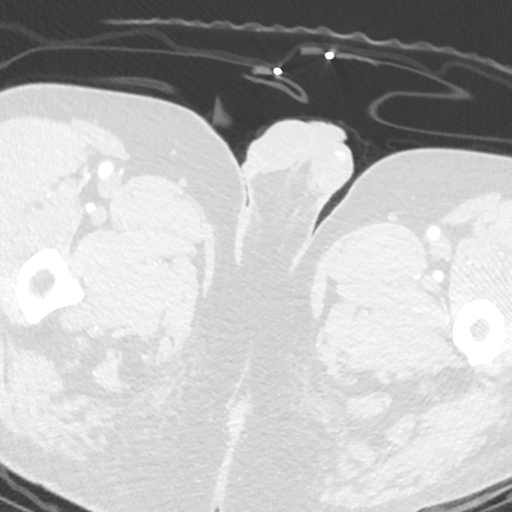
[im 106/793  mediastinal]
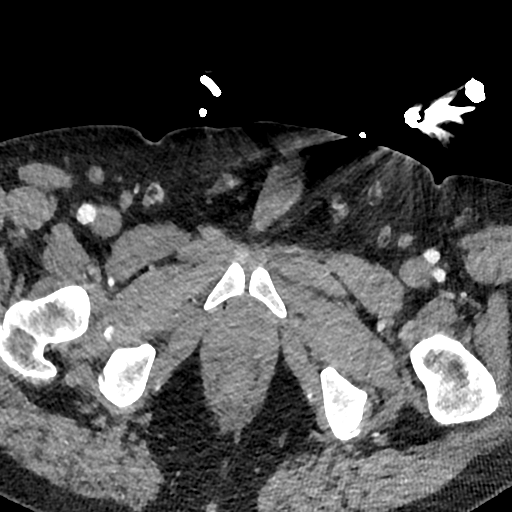
[im 159/793  lung]
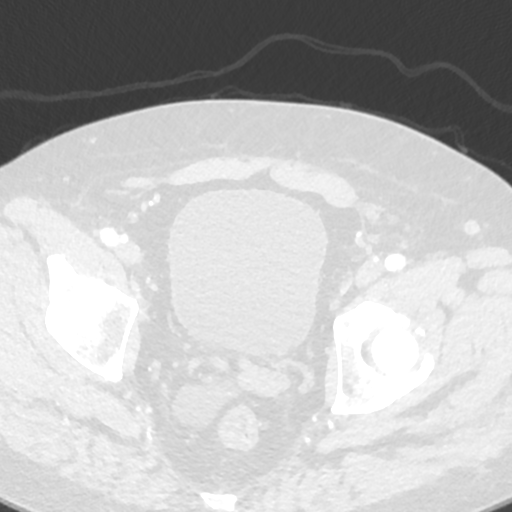
[im 212/793  mediastinal]
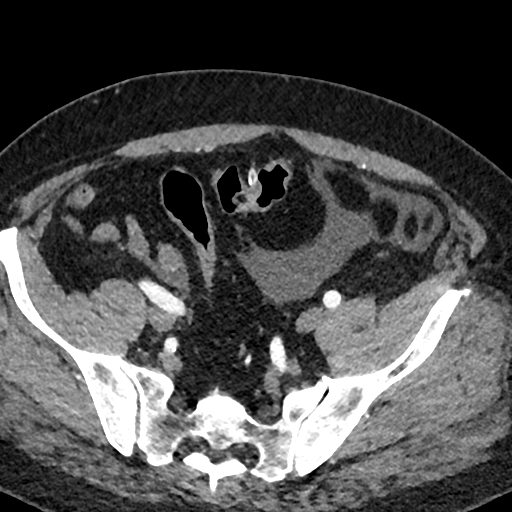
[im 265/793  lung]
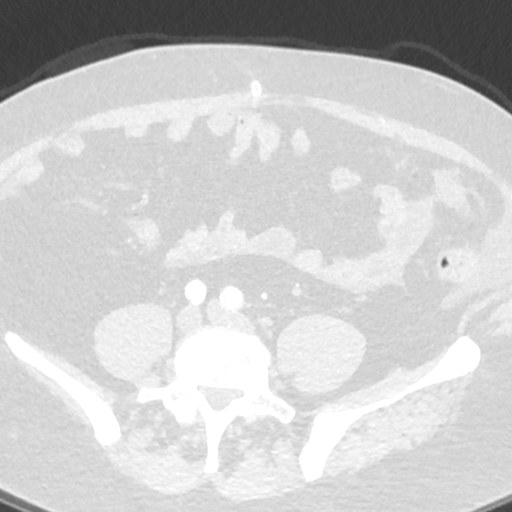
[im 317/793  mediastinal]
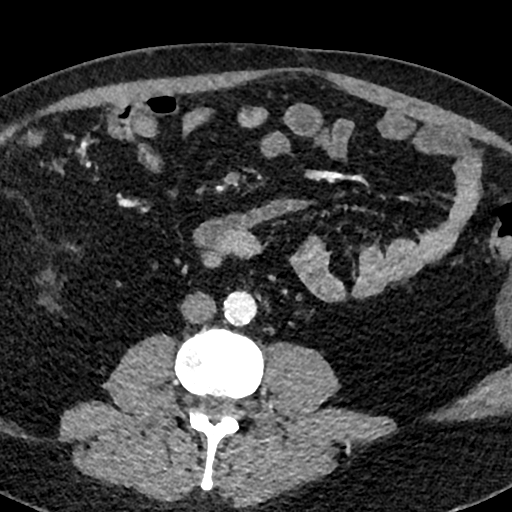
[im 370/793  lung]
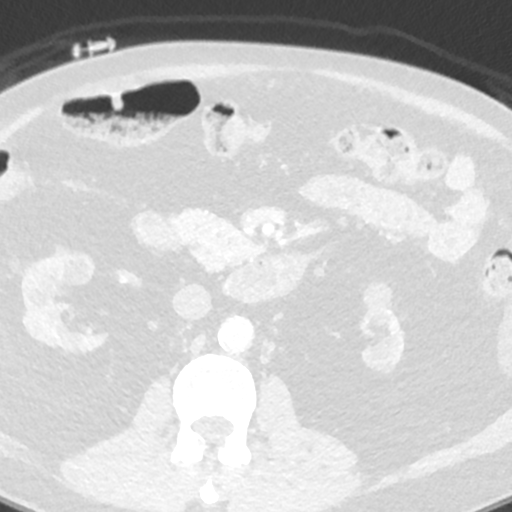
[im 423/793  mediastinal]
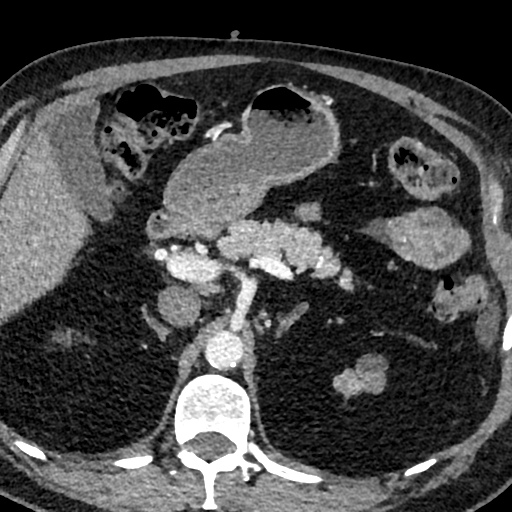
[im 476/793  lung]
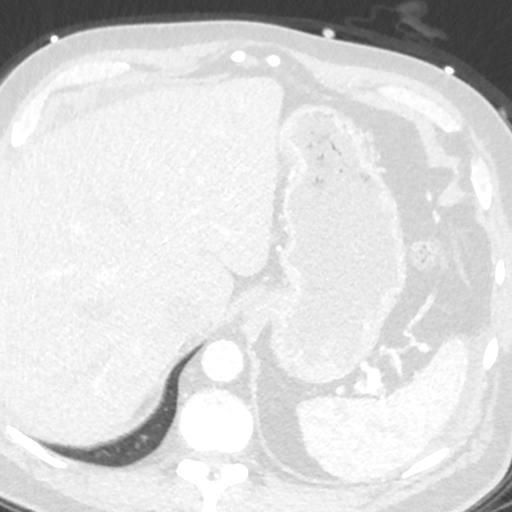
[im 529/793  mediastinal]
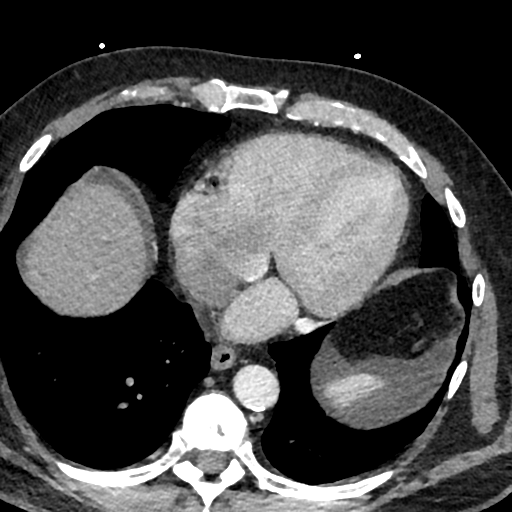
[im 581/793  lung]
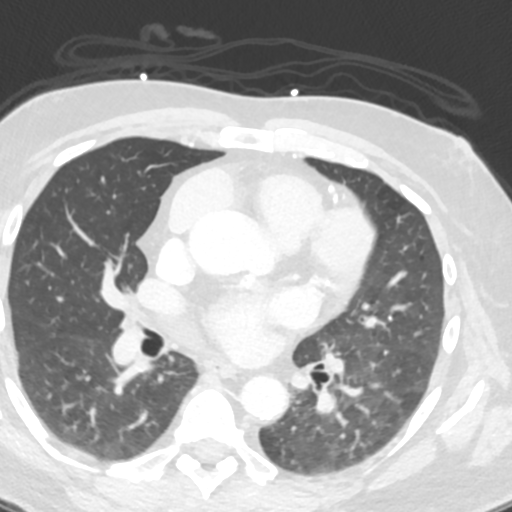
[im 634/793  mediastinal]
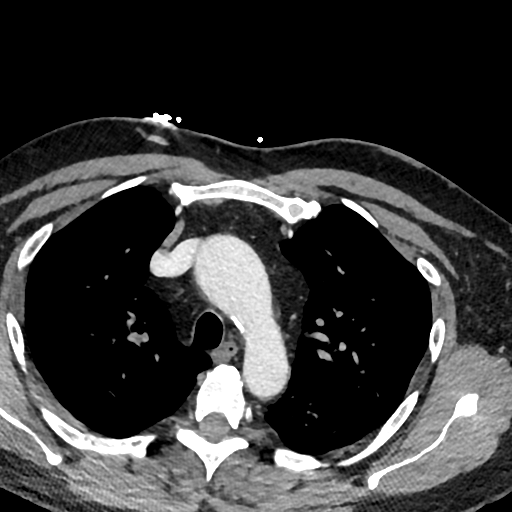
[im 687/793  lung]
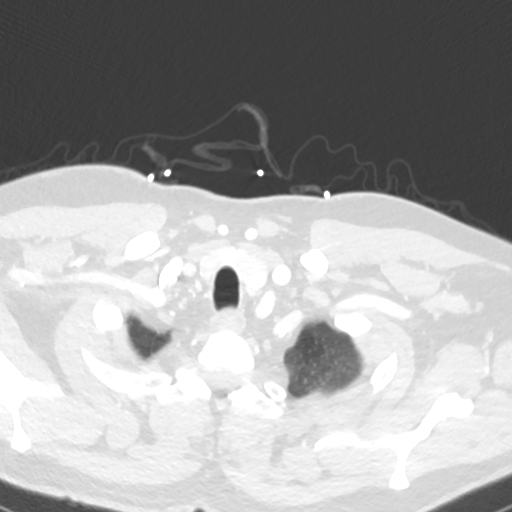
[im 740/793  mediastinal]
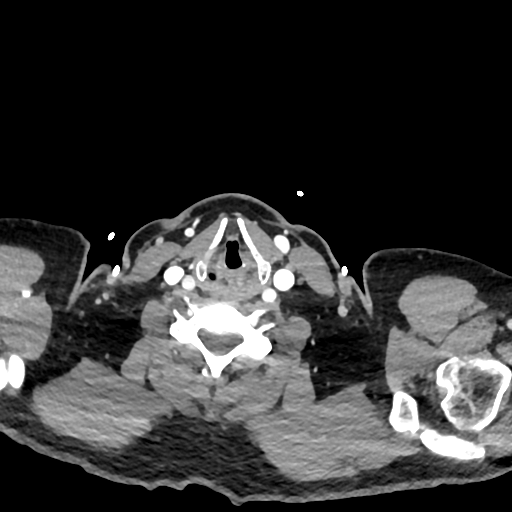

[Series 6: cor · coronal · 0.97mm/px · 1 of 159 slices shown]
[im 80/159  mediastinal]
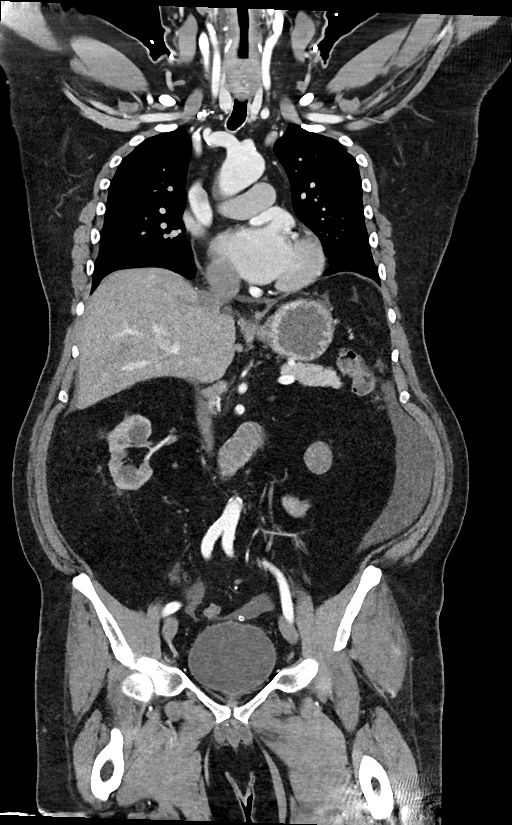

[15 of 36 positions shown; findings below may reference images not displayed]

FINDINGS: CTA CHEST FINDINGS

Cardiovascular: Heart size is borderline enlarged. There is no
significant pericardial fluid, thickening or pericardial
calcification. There is aortic atherosclerosis, as well as
atherosclerosis of the great vessels of the mediastinum and the
coronary arteries, including calcified atherosclerotic plaque in the
left main, left anterior descending, left circumflex and right
coronary arteries. Severe thickening and calcification of the aortic
valve.

Mediastinum/Lymph Nodes: No pathologically enlarged mediastinal or
hilar lymph nodes. Esophagus is unremarkable in appearance. No
axillary lymphadenopathy.

Lungs/Pleura: No acute consolidative airspace disease. No pleural
effusions. A few scattered small pulmonary nodules are noted in the
lungs bilaterally, largest of which is in the medial segment of the
right middle lobe measuring 4 mm (axial image 86 of series 5).

Musculoskeletal/Soft Tissues: There are no aggressive appearing
lytic or blastic lesions noted in the visualized portions of the
skeleton.

CTA ABDOMEN AND PELVIS FINDINGS

Hepatobiliary: No suspicious cystic or solid hepatic lesions. No
intra or extrahepatic biliary ductal dilatation. Gallbladder is
normal in appearance.

Pancreas: No pancreatic mass. No pancreatic ductal dilatation. No
pancreatic or peripancreatic fluid collections or inflammatory
changes.

Spleen: Unremarkable.

Adrenals/Urinary Tract: Severe atrophy of the left kidney. Moderate
atrophy of the right kidney. Multiple low-attenuation lesions in
both kidneys, compatible with simple cysts. In addition, in the
lower pole of the left kidney (axial image 146 of series 4) there is
an exophytic 3.0 cm intermediate attenuation (43 HU) lesion which is
increased in size compared to prior abdominal MRI 07/09/2019. No
hydroureteronephrosis. Urinary bladder is normal in appearance.
Bilateral adrenal glands are normal in appearance.

Stomach/Bowel: The appearance of the stomach is normal. No
pathologic dilatation of small bowel or colon. Normal appendix.

Vascular/Lymphatic: Aortic atherosclerosis, with vascular findings
and measurements pertinent to potential TAVR procedure, as detailed
below. No aneurysm or dissection noted in the abdominal or pelvic
vasculature. No lymphadenopathy noted in the abdomen or pelvis.

Reproductive: Prostate gland and seminal vesicles are unremarkable
in appearance.

Other: Tenckhoff peritoneal dialysis catheter with tip coiled in the
low anatomic pelvis. Small volume of fluid in the peritoneal cavity,
presumably peritoneal dialysate. No pneumoperitoneum.

Musculoskeletal: There are no aggressive appearing lytic or blastic
lesions noted in the visualized portions of the skeleton.

VASCULAR MEASUREMENTS PERTINENT TO TAVR:

AORTA:

Minimal Aortic 8iameter-YG x 17 mm

Severity of Aortic Calcification-moderate

RIGHT PELVIS:

Right Common Iliac Artery -

Minimal Ciameter-U.Y x 9.9 mm

Tortuosity-mild

Calcification-moderate

Right External Iliac Artery -

Minimal 4iameter-Q.A x 9.9 mm

Tortuosity-moderate

Calcification-minimal

Right Common Femoral Artery -

Minimal Iiameter-M.J x 9.1 mm

Tortuosity-mild

Calcification-mild

LEFT PELVIS:

Left Common Iliac Artery -

Minimal Ziameter-CH.3 x 11.1 mm

Tortuosity-moderate

Calcification-mild

Left External Iliac Artery -

Minimal Ziameter-A.A x 9.6 mm

Tortuosity-mild

Calcification-none

Left Common Femoral Artery -

Minimal Ziameter-A.A x 8.4 mm

Tortuosity-mild

Calcification-mild

Review of the MIP images confirms the above findings.
IMPRESSION: 1. Vascular findings and measurements pertinent to potential TAVR
procedure, as detailed above.
2. Severe thickening and calcification of the aortic valve,
compatible with reported clinical history of severe aortic stenosis.
3. Enlarging exophytic intermediate attenuation lesion in the lower
pole of the left kidney. Although does may simply represent a
proteinaceous/hemorrhagic cysts, further characterization with
nonemergent MRI of the abdomen with and without IV gadolinium is
recommended in the near future to better characterize this finding
and exclude the possibility of a small renal neoplasm.
4. Aortic atherosclerosis, in addition to left main and 3 vessel
coronary artery disease. Please note that although the presence of
coronary artery calcium documents the presence of coronary artery
disease, the severity of this disease and any potential stenosis
cannot be assessed on this non-gated CT examination. Assessment for
potential risk factor modification, dietary therapy or pharmacologic
therapy may be warranted, if clinically indicated.
5. Small pulmonary nodules measuring 4 mm or less in size,
nonspecific, but statistically likely benign. No follow-up needed if
patient is low-risk (and has no known or suspected primary
neoplasm). Non-contrast chest CT can be considered in 12 months if
patient is high-risk. This recommendation follows the consensus
statement: Guidelines for Management of Incidental Pulmonary Nodules
Detected on CT Images: From the [HOSPITAL] 3427; Radiology
6. Moderate right and severe left renal atrophy.
7. Additional incidental findings, as above.

## 2022-12-27 IMAGING — CT CT HEART MORP W/ CTA COR W/ SCORE W/ CA W/CM &/OR W/O CM
2 of 7 series · 11 of 20 positions shown, 13 images · non-contrast
Comparison: None.
COMPARISON: None.

Addendum:
EXAM:
OVER-READ INTERPRETATION  CT CHEST

The following report is an over-read performed by radiologist Dr.
Rhoze Ghimire [[email or web address]] on 12/23/2020. This
over-read does not include interpretation of cardiac or coronary
anatomy or pathology. The coronary calcium score/coronary CTA
interpretation by the cardiologist is attached.
CLINICAL DATA: Severe Aortic Stenosis.
Cardiac TAVR CT
TECHNIQUE: A non-contrast, gated CT scan was obtained with axial slices of 3 mm
through the heart for aortic valve calcium scoring. A 120 kV
retrospective, gated, contrast cardiac scan was obtained. Gantry
rotation speed was 250 msecs and collimation was 0.6 mm.
Nitroglycerin was not given. The 3D data set was reconstructed in 5%
intervals of the 0-95% of the R-R cycle. Systolic and diastolic
phases were analyzed on a dedicated workstation using MPR, MIP, and
VRT modes. The patient received 100 cc of contrast.

[Series 8: 0-90% · axial · 0.39mm/px · z∈[+1230,+1359]mm · 5 of 3220 slices shown]
[im 537/3220  vessel]
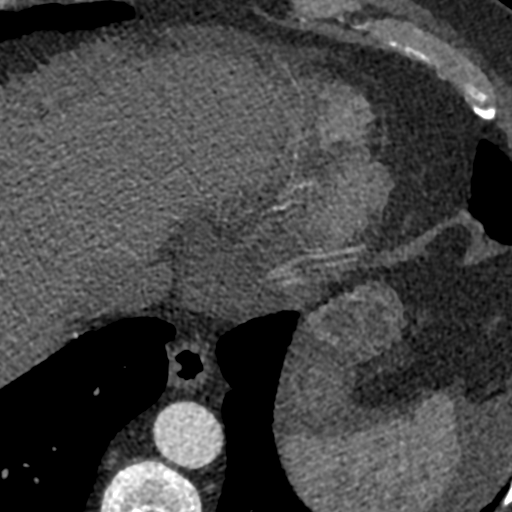
[im 1074/3220  vessel]
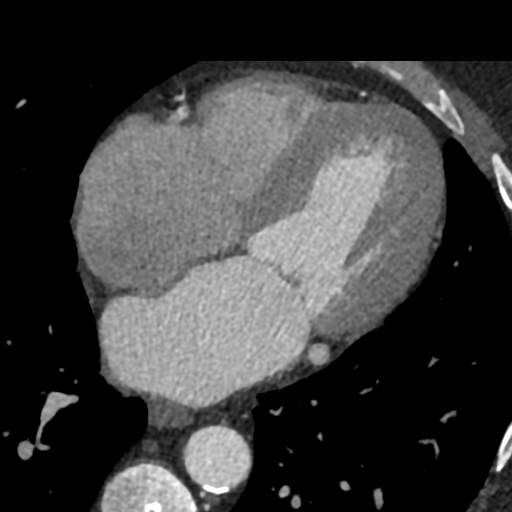
[im 1610/3220  vessel]
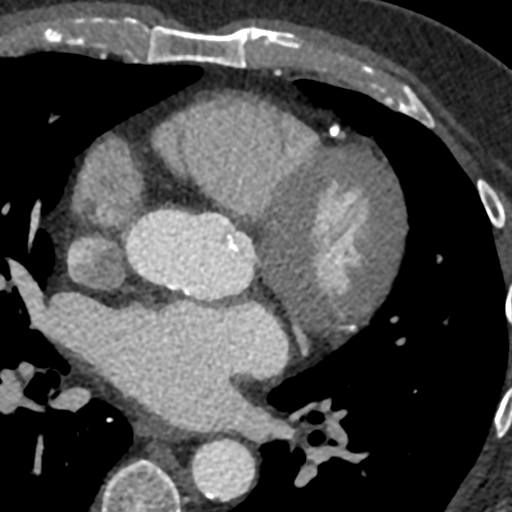
[im 2147/3220  vessel]
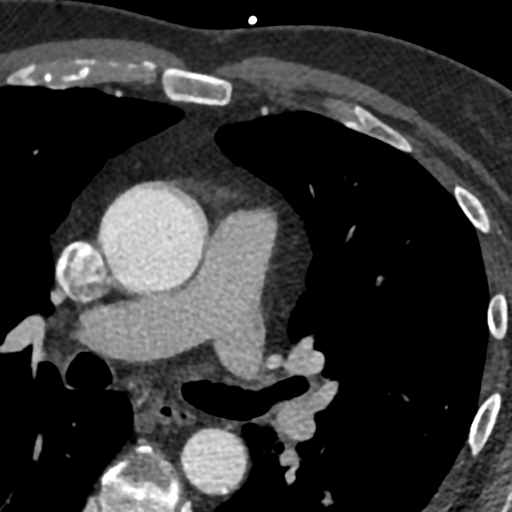
[im 2683/3220  vessel]
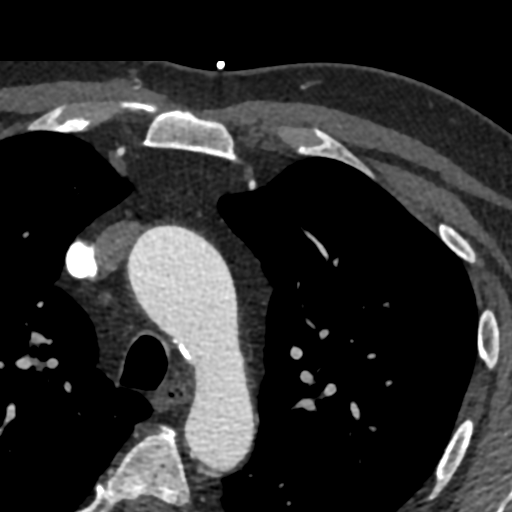

[Series 9: 5-95% · axial · 0.39mm/px · z∈[+1225,+1363]mm · 6 of 3220 slices shown, 8 images]
[im 460/3220  vessel]
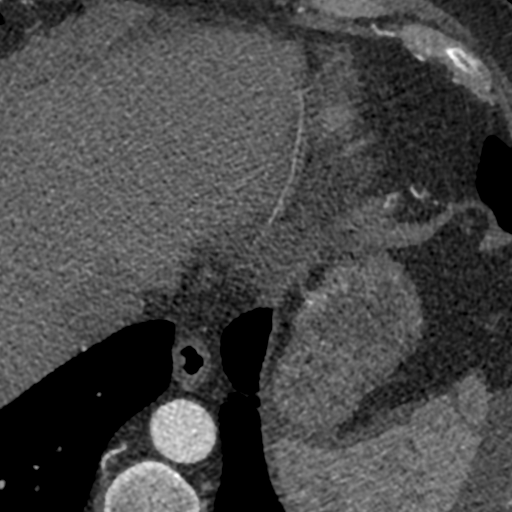
[im 460/3220  lung]
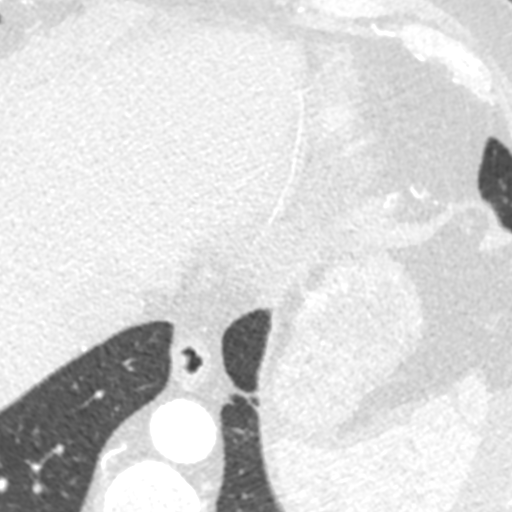
[im 920/3220  vessel]
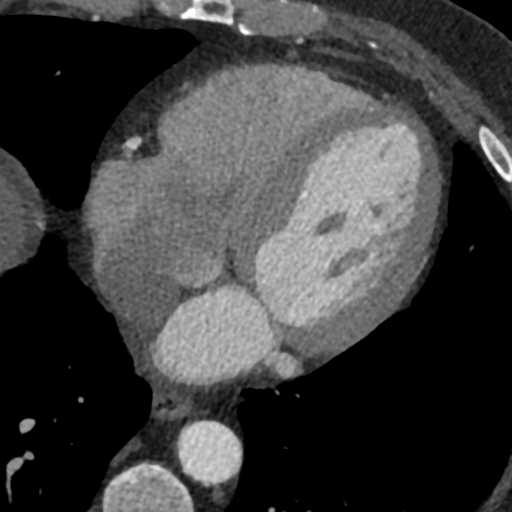
[im 1380/3220  vessel]
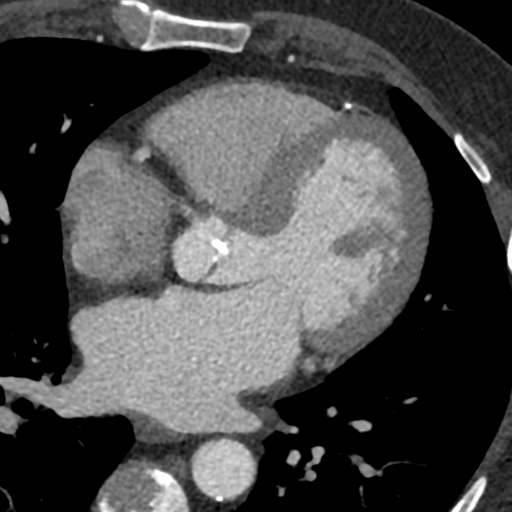
[im 1840/3220  vessel]
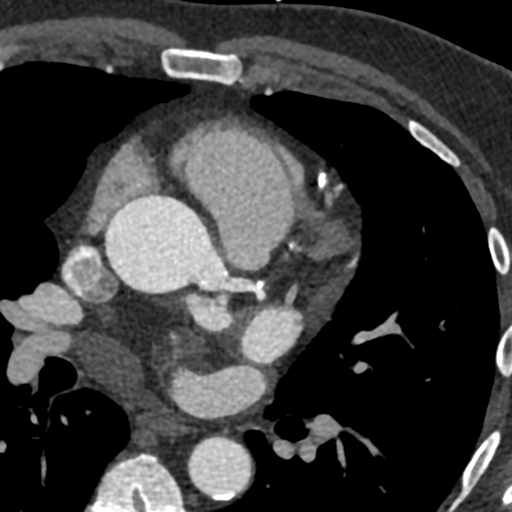
[im 2300/3220  vessel]
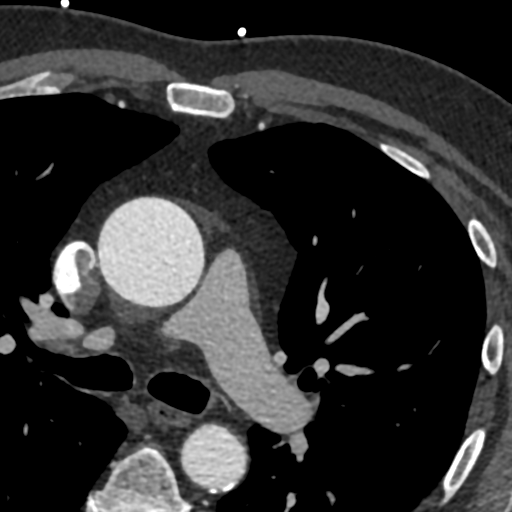
[im 2300/3220  lung]
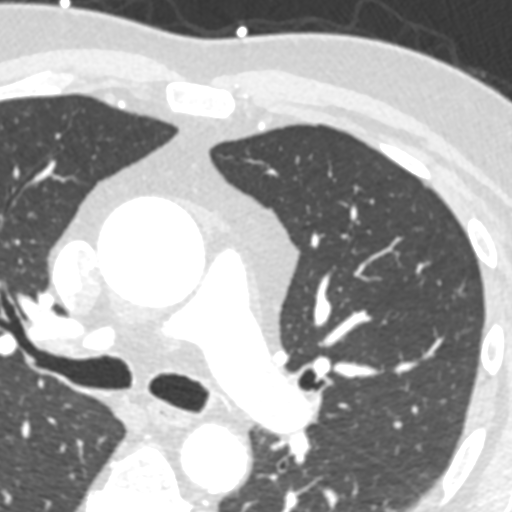
[im 2760/3220  vessel]
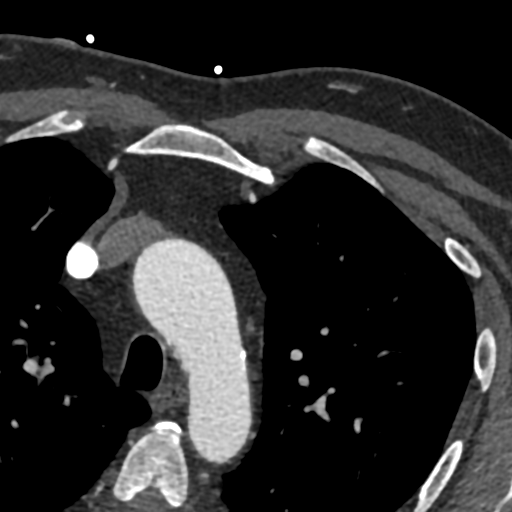

[11 of 20 positions shown; findings below may reference images not displayed]

FINDINGS: Extracardiac findings will be described separately under dictation
for contemporaneously obtained CTA chest, abdomen and pelvis.
IMPRESSION: Please see separate dictation for contemporaneously obtained CTA
chest, abdomen and pelvis dated 12/22/2020 for full description of
relevant extracardiac findings.
FINDINGS: Image quality: Excellent.

Noise artifact is: Limited.

Valve Morphology: Bicuspid aortic valve with fusion of the RCC/NCC
with raphe (Geral type 1). There is bulky calcification of the
raphe. Leaflets are severely calcified with restricted leaflet
movement in systole.

Aortic Valve Calcium score: 9601

Aortic annular dimension:

Phase assessed: 25%

Annular area: 513 mm2

Annular perimeter: 82.0 mm

Max diameter: 27.8 mm

Min diameter: 24.2 mm

Annular and subannular calcification: None.

Optimal coplanar projection: LAO 4 RIVERA KALEMBERG 8

Coronary Artery Height above Annulus:

Left Main: 14.3 mm

Right Coronary: 11.2 mm

Sinus of Valsalva Measurements:

Maximum diameter: 36 mm

Inter-commissural diameter: 30 mm

Sinus of Valsalva Height:

Non-coronary: 19.0 mm

Right-coronary: 19.0 mm

Left-coronary: 26.7 mm

Sinotubular Junction: 33 mm

Ascending Thoracic Aorta: Aneurysmal up to 42 mm (double oblique).

Coronary Arteries: Normal coronary origin. Right dominance. The
study was performed without use of NTG and is insufficient for
plaque evaluation. Please refer to recent cardiac catheterization
for coronary assessment. 3-vessel coronary calcifications noted.

Cardiac Morphology:

Right Atrium: Right atrial size is within normal limits.

Right Ventricle: The right ventricular cavity is within normal
limits.

Left Atrium: Left atrial size is normal in size with no left atrial
appendage filling defect.

Left Ventricle: The ventricular cavity size is within normal limits.
There are no stigmata of prior infarction. There is no abnormal
filling defect.

Pulmonary arteries: Normal in size without proximal filling defect.

Pulmonary veins: Normal pulmonary venous drainage.

Pericardium: Normal thickness with no significant effusion or
calcium present.

Mitral Valve: The mitral valve is normal structure without
significant calcification.

Extra-cardiac findings: See attached radiology report for
non-cardiac structures.
IMPRESSION: 1. Bicuspid aortic valve with fusion of the RCC/NCC with raphe
(Geral type 1). Bulky calcification of the raphe noted.

2. Annular measurements support a 26 mm S3 TAVR (513 mm2).

3. No significant annular or subannular calcifications.

4. Sufficient coronary to annulus distance.

5. Optimal Fluoroscopic Angle for Delivery: LAO 4 RIVERA KALEMBERG 8

6. Aneurysmal dilation of the ascending aorta up to 42 mm (double
oblique).

*** End of Addendum ***
EXAM:
OVER-READ INTERPRETATION  CT CHEST

The following report is an over-read performed by radiologist Dr.
Rhoze Ghimire [REDACTED] on 12/23/2020. This
over-read does not include interpretation of cardiac or coronary
anatomy or pathology. The coronary calcium score/coronary CTA
interpretation by the cardiologist is attached.
FINDINGS: Extracardiac findings will be described separately under dictation
for contemporaneously obtained CTA chest, abdomen and pelvis.
IMPRESSION: Please see separate dictation for contemporaneously obtained CTA
chest, abdomen and pelvis dated 12/22/2020 for full description of
relevant extracardiac findings.

## 2023-01-09 ENCOUNTER — Ambulatory Visit: Payer: Self-pay | Admitting: Podiatry

## 2023-01-18 ENCOUNTER — Ambulatory Visit (INDEPENDENT_AMBULATORY_CARE_PROVIDER_SITE_OTHER): Payer: Managed Care, Other (non HMO) | Admitting: Podiatry

## 2023-01-18 DIAGNOSIS — B351 Tinea unguium: Secondary | ICD-10-CM

## 2023-01-18 DIAGNOSIS — M79675 Pain in left toe(s): Secondary | ICD-10-CM

## 2023-01-18 DIAGNOSIS — M79674 Pain in right toe(s): Secondary | ICD-10-CM

## 2023-01-18 NOTE — Progress Notes (Signed)
   Chief Complaint  Patient presents with   Toe Pain    Patient is here for bilateral hallux pain/ingrowns     SUBJECTIVE Patient presents to office today complaining of elongated, thickened nails that cause pain while ambulating in shoes over the last 3 weeks.  He says he is concerned for possible ingrown's.  He presents for further treatment evaluation  Past Medical History:  Diagnosis Date   Anemia    Anemia in chronic kidney disease 03/20/2020   Bicuspid aortic valve    Blood transfusion without reported diagnosis    CAD (coronary artery disease)    proximal LAD 50%, mid LAD 50-60%, mid to distal LAD 99%; mid circumflex 20%; proximal RCA 20%, distal RCA 50%; PDA 60-70%, proximal posterior AV groove 50%, proximal posterior lateral 50%.  There was a question of possible LVOT gradient;  s/p Resolute DES to mid-distal LAD 10/2010;  echocardiogram 11/17/10: EF 55-60%, mild LVH, grade 1 diastolic dysfunction, normal aortic valve, mild MR, PASP 32.    Chicken pox    Dysrhythmia    A. Fib   Enlarged aorta ([name])    ESRD on peritoneal dialysis ([name])    Glaucoma    Gout    allopurinol 300mg , colchicine prn in past, has not had flares   Headache    History of kidney stones    Hypercholesterolemia    With hypertriglyceridemia   Hypertension    Mild aortic stenosis    PAF (paroxysmal atrial fibrillation) ([name])    a. Documented on event monitor 01/2016.   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   SDH (subdural hematoma) ([name]) 08/2021   left   Sleep apnea    cpap nightly    No Known Allergies   OBJECTIVE General Patient is awake, alert, and oriented x 3 and in no acute distress. Derm Skin is dry and supple bilateral. Negative open lesions or macerations. Remaining integument unremarkable. Nails are tender, long, thickened and dystrophic with  subungual debris, consistent with onychomycosis, 1-5 bilateral. No signs of infection noted. Vasc  DP and PT pedal pulses palpable bilaterally. Temperature gradient within normal limits.  Neuro Epicritic and protective threshold sensation grossly intact bilaterally.  Musculoskeletal Exam No symptomatic pedal deformities noted bilateral. Muscular strength within normal limits.  ASSESSMENT 1.  Pain due to onychomycosis of toenails both 2.  Incurvated nails medial border the bilateral great toes  PLAN OF CARE -Patient evaluated today.  -Instructed to maintain good pedal hygiene and foot care.  -Mechanical debridement of nails 1-5 bilaterally performed using a nail nipper. Filed with dremel without incident.  Patient felt significant improvement after debridement of the nails -Return to clinic as needed   Felecia Shelling, DPM [facility]  Dr. Felecia Shelling, DPM    [address] [place], [place] [address]                Office [phone]  Fax [phone]

## 2023-02-02 ENCOUNTER — Telehealth: Payer: Self-pay | Admitting: Cardiovascular Disease

## 2023-02-02 MED ORDER — AMOXICILLIN 500 MG PO TABS: ORAL_TABLET | ORAL | 3 refills | Status: AC

## 2023-02-02 NOTE — Telephone Encounter (Signed)
Spoke with patient and he states he is getting a his teeth cleaned and will need ABT for the procedure. Would you like to order ABT.

## 2023-02-02 NOTE — Telephone Encounter (Signed)
Pt c/o medication issue:  1. Name of Medication:   Amoxicillin  2. How are you currently taking this medication (dosage and times per day)?   3. Are you having a reaction (difficulty breathing--STAT)?   4. What is your medication issue?    Patient stated he will be going to the dentist and wants a prescription for this medication.

## 2023-02-02 NOTE — Telephone Encounter (Signed)
Pt is s/p TAVR.  Antibiotics needed for SBE prophylaxis for dental procedures.

## 2023-03-11 ENCOUNTER — Other Ambulatory Visit: Payer: Self-pay | Admitting: Family Medicine

## 2023-03-28 ENCOUNTER — Other Ambulatory Visit: Payer: Self-pay | Admitting: Cardiovascular Disease

## 2023-04-21 ENCOUNTER — Encounter (HOSPITAL_COMMUNITY): Payer: Self-pay | Admitting: Emergency Medicine

## 2023-04-21 ENCOUNTER — Emergency Department (HOSPITAL_COMMUNITY): Payer: Medicare Other

## 2023-04-21 ENCOUNTER — Other Ambulatory Visit: Payer: Self-pay

## 2023-04-21 ENCOUNTER — Emergency Department (HOSPITAL_COMMUNITY)
Admission: EM | Admit: 2023-04-21 | Discharge: 2023-04-21 | Disposition: A | Discharge status: Home / Self Care | Payer: Medicare Other

## 2023-04-21 DIAGNOSIS — I2510 Atherosclerotic heart disease of native coronary artery without angina pectoris: Secondary | ICD-10-CM | POA: Diagnosis not present

## 2023-04-21 DIAGNOSIS — Z20822 Contact with and (suspected) exposure to covid-19: Secondary | ICD-10-CM | POA: Insufficient documentation

## 2023-04-21 DIAGNOSIS — Z992 Dependence on renal dialysis: Secondary | ICD-10-CM | POA: Diagnosis not present

## 2023-04-21 DIAGNOSIS — N186 End stage renal disease: Secondary | ICD-10-CM | POA: Insufficient documentation

## 2023-04-21 DIAGNOSIS — R569 Unspecified convulsions: Secondary | ICD-10-CM | POA: Insufficient documentation

## 2023-04-21 LAB — I-STAT CHEM 8, ED
BUN: 71 mg/dL — ABNORMAL HIGH (ref 8–23)
Calcium, Ion: 0.91 mmol/L — ABNORMAL LOW (ref 1.15–1.40)
Chloride: 102 mmol/L (ref 98–111)
Creatinine, Ser: 17.9 mg/dL — ABNORMAL HIGH (ref 0.61–1.24)
Glucose, Bld: 86 mg/dL (ref 70–99)
HCT: 37 % — ABNORMAL LOW (ref 39.0–52.0)
Hemoglobin: 12.6 g/dL — ABNORMAL LOW (ref 13.0–17.0)
Potassium: 5 mmol/L (ref 3.5–5.1)
Sodium: 137 mmol/L (ref 135–145)
TCO2: 23 mmol/L (ref 22–32)

## 2023-04-21 LAB — CBC WITH DIFFERENTIAL/PLATELET
Abs Immature Granulocytes: 0.06 K/uL (ref 0.00–0.07)
Basophils Absolute: 0.1 K/uL (ref 0.0–0.1)
Basophils Relative: 1 %
Eosinophils Absolute: 0.2 K/uL (ref 0.0–0.5)
Eosinophils Relative: 2 %
HCT: 36 % — ABNORMAL LOW (ref 39.0–52.0)
Hemoglobin: 11.9 g/dL — ABNORMAL LOW (ref 13.0–17.0)
Immature Granulocytes: 1 %
Lymphocytes Relative: 14 %
Lymphs Abs: 1 K/uL (ref 0.7–4.0)
MCH: 35.6 pg — ABNORMAL HIGH (ref 26.0–34.0)
MCHC: 33.1 g/dL (ref 30.0–36.0)
MCV: 107.8 fL — ABNORMAL HIGH (ref 80.0–100.0)
Monocytes Absolute: 0.7 K/uL (ref 0.1–1.0)
Monocytes Relative: 9 %
Neutro Abs: 5.4 K/uL (ref 1.7–7.7)
Neutrophils Relative %: 73 %
Platelets: 114 K/uL — ABNORMAL LOW (ref 150–400)
RBC: 3.34 MIL/uL — ABNORMAL LOW (ref 4.22–5.81)
RDW: 13.7 % (ref 11.5–15.5)
WBC: 7.3 K/uL (ref 4.0–10.5)
nRBC: 0 % (ref 0.0–0.2)

## 2023-04-21 LAB — COMPREHENSIVE METABOLIC PANEL WITH GFR
ALT: 30 U/L (ref 0–44)
AST: 14 U/L — ABNORMAL LOW (ref 15–41)
Albumin: 3.5 g/dL (ref 3.5–5.0)
Alkaline Phosphatase: 103 U/L (ref 38–126)
Anion gap: 19 — ABNORMAL HIGH (ref 5–15)
BUN: 80 mg/dL — ABNORMAL HIGH (ref 8–23)
CO2: 20 mmol/L — ABNORMAL LOW (ref 22–32)
Calcium: 7.8 mg/dL — ABNORMAL LOW (ref 8.9–10.3)
Chloride: 99 mmol/L (ref 98–111)
Creatinine, Ser: 15.49 mg/dL — ABNORMAL HIGH (ref 0.61–1.24)
GFR, Estimated: 3 mL/min — ABNORMAL LOW (ref >60)
Glucose, Bld: 94 mg/dL (ref 70–99)
Potassium: 5.1 mmol/L (ref 3.5–5.1)
Sodium: 138 mmol/L (ref 135–145)
Total Bilirubin: 0.8 mg/dL (ref 0.0–1.2)
Total Protein: 6.8 g/dL (ref 6.5–8.1)

## 2023-04-21 LAB — RESP PANEL BY RT-PCR (RSV, FLU A&B, COVID)  RVPGX2
Influenza A by PCR: NEGATIVE
Influenza B by PCR: NEGATIVE
Resp Syncytial Virus by PCR: NEGATIVE
SARS Coronavirus 2 by RT PCR: NEGATIVE

## 2023-04-21 LAB — CBG MONITORING, ED: Glucose-Capillary: 83 mg/dL (ref 70–99)

## 2023-04-21 MED ORDER — CALCIUM CARBONATE ANTACID 500 MG PO CHEW: 1.0000 | CHEWABLE_TABLET | Freq: Once | ORAL | Status: DC

## 2023-04-21 NOTE — ED Notes (Signed)
 Patient transported to CT

## 2023-04-21 NOTE — Discharge Instructions (Signed)
 Evaluation today was overall reassuring.  Dr. Franky Macho would like to schedule an appointment with you in his office but CT scan today of your head was stable.  If you develop weakness or numbness in your extremities, visual disturbance, facial droop, have another seizure or any other concerning symptoms please return to the emergency department further evaluation.  Otherwise would also recommend that you follow-up with your PCP and your nephrologist.

## 2023-04-21 NOTE — ED Provider Notes (Signed)
 [place] EMERGENCY DEPARTMENT AT [facility] Provider Note   CSN: [id number] Arrival date & time: 04/21/23  2952     History  Chief Complaint  Patient presents with   Seizures   HPI Daniel Solomon is a 63 y.o. male with history of subdural hematoma status post cranioplasty, seizures, CAD s/p coronary stent, paroxysmal A-fib, status post TAVR, ESRD presenting for seizure-like activity.  States he was undergoing peritoneal dialysis at home today around 7:30 AM.  His whole body started to shake.  States he was entirely lucid during the whole experience and the shaking lasted about 45 seconds.  Denies postictal phase.  Denies loss of consciousness.  Denies associated chest pain, palpitation shortness of breath.   Seizures      Home Medications Prior to Admission medications   Medication Sig Start Date End Date Taking? Authorizing Provider  allopurinol (ZYLOPRIM) 300 MG tablet TAKE 1 TABLET BY MOUTH EVERY DAY 01/04/22   Shelva Majestic, MD  amoxicillin (AMOXIL) 500 MG tablet Take FOUR tablets (2000 mg) by mouth ONE HOUR BEFORE ANY DENTAL APPOINTMENT (including cleanings) 02/02/23   Kathleene Hazel, MD  atorvastatin (LIPITOR) 40 MG tablet TAKE 1 TABLET BY MOUTH EVERY DAY 01/04/22   Shelva Majestic, MD  AURYXIA 1 GM 210 MG(Fe) tablet Take 630 mg by mouth 3 (three) times daily with meals. 12/23/20   [provider]  calcitRIOL (ROCALTROL) 0.25 MCG capsule Take 0.25 mcg by mouth daily. 01/29/21   [provider]  gabapentin (NEURONTIN) 100 MG capsule Take 100 mg by mouth at bedtime.    [provider]  gentamicin cream (GARAMYCIN) 0.1 % Apply 1 Application topically daily. 04/07/22   [provider]  HYDROcodone-acetaminophen (NORCO/VICODIN) 5-325 MG tablet Take 1 tablet by mouth every 6 (six) hours as needed for severe pain. 1-2 tabs po tid prn 02/27/22   Meyran, Tiana Loft, NP  labetalol (NORMODYNE) 100 MG tablet Take 100 mg by  mouth 2 (two) times daily. 04/30/20   [provider]  levETIRAcetam (KEPPRA) 250 MG tablet Take 1 tablet (250 mg total) by mouth 2 (two) times daily. 02/27/22   Meyran, Tiana Loft, NP  levETIRAcetam (KEPPRA) 500 MG tablet Take 1 tablet (500 mg total) by mouth 2 (two) times daily. 02/27/22   Meyran, Tiana Loft, NP  loratadine (CLARITIN) 10 MG tablet Take 10 mg by mouth daily.    [provider]  methocarbamol (ROBAXIN) 500 MG tablet Take 2 tablets (1,000 mg total) by mouth 4 (four) times daily as needed (for Headache). START with 1 pill, if not too drowsy take 2 pills. 09/09/21   Dulce Sellar, NP  Methoxy PEG-Epoetin Beta (MIRCERA IJ) Inject into the skin as needed. 06/15/20   [provider]  NON FORMULARY Pt uses a cpap nightly    [provider]  Tenapanor HCl, CKD, (XPHOZAH) 30 MG TABS Take 30 mg by mouth in the morning and at bedtime.    [provider]  torsemide (DEMADEX) 100 MG tablet TAKE 1 TABLET BY MOUTH EVERY DAY 03/29/23   Kathleene Hazel, MD      Allergies    Patient has no known allergies.    Review of Systems   Review of Systems  Neurological:  Positive for seizures.    Physical Exam Updated Vital Signs BP 97/63   Pulse 92   Temp 97.6 F (36.4 C) (Oral)   Resp 17   Ht 5\' 8"  (1.727 m)  Wt 99 kg   SpO2 100%   BMI 33.19 kg/m  Physical Exam Vitals and nursing note reviewed.  HENT:     Head: Normocephalic and atraumatic.     Mouth/Throat:     Mouth: Mucous membranes are moist.  Eyes:     General:        Right eye: No discharge.        Left eye: No discharge.     Conjunctiva/sclera: Conjunctivae normal.  Cardiovascular:     Rate and Rhythm: Normal rate and regular rhythm.     Pulses: Normal pulses.     Heart sounds: Normal heart sounds.  Pulmonary:     Effort: Pulmonary effort is normal.     Breath sounds: Normal breath sounds.  Abdominal:     General: Abdomen is flat.     Palpations: Abdomen  is soft.  Skin:    General: Skin is warm and dry.  Neurological:     General: No focal deficit present.     Comments: GCS 15. Speech is goal oriented. No deficits appreciated to CN III-XII; symmetric eyebrow raise, no facial drooping, tongue midline. Patient has equal grip strength bilaterally with 5/5 strength against resistance in all major muscle groups bilaterally. Sensation to light touch intact. Patient moves extremities without ataxia. Normal finger-nose-finger. Did not assess gait.    Psychiatric:        Mood and Affect: Mood normal.     ED Results / Procedures / Treatments   Labs (all labs ordered are listed, but only abnormal results are displayed) Labs Reviewed  CBC WITH DIFFERENTIAL/PLATELET - Abnormal; Notable for the following components:      Result Value   RBC 3.34 (*)    Hemoglobin 11.9 (*)    HCT 36.0 (*)    MCV 107.8 (*)    MCH 35.6 (*)    Platelets 114 (*)    All other components within normal limits  COMPREHENSIVE METABOLIC PANEL - Abnormal; Notable for the following components:   CO2 20 (*)    BUN 80 (*)    Creatinine, Ser 15.49 (*)    Calcium 7.8 (*)    AST 14 (*)    GFR, Estimated 3 (*)    Anion gap 19 (*)    All other components within normal limits  I-STAT CHEM 8, ED - Abnormal; Notable for the following components:   BUN 71 (*)    Creatinine, Ser 17.90 (*)    Calcium, Ion 0.91 (*)    Hemoglobin 12.6 (*)    HCT 37.0 (*)    All other components within normal limits  RESP PANEL BY RT-PCR (RSV, FLU A&B, COVID)  RVPGX2  CBG MONITORING, ED  CBG MONITORING, ED    EKG EKG Interpretation Date/Time:  Friday April 21 2023 08:53:35 EST Ventricular Rate:  92 PR Interval:    QRS Duration:  92 QT Interval:  372 QTC Calculation: 461 R Axis:   44  Text Interpretation: Atrial fibrillation Confirmed by Estanislado Pandy [phone]) on 04/21/2023 10:57:36 AM  Radiology CT Head Wo Contrast Result Date: 04/21/2023 CLINICAL DATA:  Seizure, new onset no  history of trauma. EXAM: CT HEAD WITHOUT CONTRAST TECHNIQUE: Contiguous axial images were obtained from the base of the skull through the vertex without intravenous contrast. RADIATION DOSE REDUCTION: This exam was performed according to the departmental dose-optimization program which includes automated exposure control, adjustment of the mA and/or kV according to patient size and/or use of iterative reconstruction technique. COMPARISON:  CT head without contrast 12/02/2022. FINDINGS: Brain: No acute infarct, hemorrhage, or mass lesion is present. Previous craniectomy is again noted. Progressive thickening calcification of the dura is noted. Punctate calcification in the left frontal operculum is noted. No significant white matter lesions are present. Deep brain nuclei are within normal limits. Increased CSF density extradural collection is present on the left. No significant extracranial fluid collection is present as has been seen in the past. The brainstem and cerebellum are within normal limits. Midline structures are within normal limits. Vascular: Atherosclerotic calcifications are present within the cavernous internal carotid arteries and at the dural margin of both vertebral arteries. Skull: Stem the cranioplasty is stable. Sinuses/Orbits: The paranasal sinuses and mastoid air cells are clear. Bilateral lens replacements are noted. Globes and orbits are otherwise unremarkable. IMPRESSION: 1. No acute intracranial abnormality or significant interval change. 2. Previous left craniectomy and cranioplasty. 3. Progressive thickening and calcification of the dura. 4. Increased CSF density extradural collection on the left. No significant extracranial fluid collection is present as has been seen in the past. This does not create any mass effect. Consider short-term follow-up CT to assess stability of this collection in 2-3 months unless the patient has additional seizures or other symptoms. Electronically Signed    By: Marin Roberts M.D.   On: 04/21/2023 13:02   DG Chest Port 1 View Result Date: 04/21/2023 CLINICAL DATA:  Seizure and syncope EXAM: PORTABLE CHEST 1 VIEW COMPARISON:  Chest radiograph dated 06/19/2022 FINDINGS: Normal lung volumes. Bibasilar patchy opacities. No pleural effusion or pneumothorax. Similar cardiomediastinal silhouette status post aortic valve replacement. No radiographic finding of acute displaced fracture. IMPRESSION: Bibasilar patchy opacities, which may reflect atelectasis, aspiration, or pneumonia. Electronically Signed   By: Agustin Cree M.D.   On: 04/21/2023 10:27    Procedures Procedures    Medications Ordered in ED Medications  calcium carbonate (TUMS - dosed in mg elemental calcium) chewable tablet 200 mg of elemental calcium (has no administration in time range)    ED Course/ Medical Decision Making/ A&P Clinical Course as of 04/21/23 1539  Fri Apr 21, 2023  1207 MCV(!): 107.8 [JR]  1208 CT Head Wo Contrast [JR]  1454 BMI (Calculated): 33.19 [JR]  1516 BMI (Calculated): 33.19 [JR]    Clinical Course User Index [JR] Gareth Eagle, PA-C                                 Medical Decision Making Amount and/or Complexity of Data Reviewed Labs: ordered. Decision-making details documented in ED Course. Radiology: ordered. Decision-making details documented in ED Course.   Initial Impression and Ddx 63 year old well-appearing male present for seizure-like activity.  Exam was unremarkable.  DDx includes seizure, electrolyte derangement, dehydration, intracranial mass, stroke, other. Patient PMH that increases complexity of ED encounter:  history of subdural hematoma status post cranioplasty, seizures, CAD s/p coronary stent, paroxysmal A-fib, status post TAVR, ESRD   Interpretation of Diagnostics - I independent reviewed and interpreted the labs as followed: hypocalcemia, anion gap  - I independently visualized the following imaging with scope of  interpretation limited to determining acute life threatening conditions related to emergency care: CT head, which revealed  Progressive thickening and calcification of the dura. 4. Increased CSF density extradural collection on the left. No significant extracranial fluid collection is present as has been seen in the past. This does not create any mass effect.  Chest x-ray revealed bilateral  basilar patchy opacities consistent with atelectasis, pneumonia, spiration  -I personally reviewed interpret EKG which revealed atrial fibrillation but otherwise nonischemic  Patient Reassessment and Ultimate Disposition/Management On reassessment, patient remained well, no witnessed seizure or seizure-like activity and hemodynamically stable.  Discussed CT findings with Dr. Franky Macho his neurosurgeon.  He advised at this time there is no intervention indicated but did recommend that he follow-up in his office.  I discussed follow-up plans with patient also advised him to follow-up with his PCP as well.  Discussed pertinent precautions.  Discharged in good condition.  Patient management required discussion with the following services or consulting groups:  None  Complexity of Problems Addressed Acute complicated illness or Injury  Additional Data Reviewed and Analyzed Further history obtained from: Further history from spouse/family member, Past medical history and medications listed in the EMR, and Prior ED visit notes  Patient Encounter Risk Assessment None         Final Clinical Impression(s) / ED Diagnoses Final diagnoses:  Seizure-like activity [facility])    Rx / DC Orders ED Discharge Orders     None         Gareth Eagle, PA-C 04/21/23 1539    Coral Spikes, DO 04/21/23 1606

## 2023-04-21 NOTE — ED Triage Notes (Signed)
 Pt BIB [facility] for seizure witnessed by family lasting approx. 45 seconds.  Described as Daniel Solomon.  Pt was alert on medic arrival to scene.  No hx of seizure except a seizure post-op r/t a subdural maybe last year per pt.   Pt gets home peritoneal dialysis.  Seizure occurred while receiving dialysis. Pt has not symptoms of illness but wife was dx with Covid 2 days ago.   VSS

## 2023-04-23 ENCOUNTER — Other Ambulatory Visit: Payer: Self-pay | Admitting: Cardiovascular Disease

## 2023-04-28 ENCOUNTER — Telehealth: Payer: Self-pay | Admitting: *Deleted

## 2023-04-28 ENCOUNTER — Ambulatory Visit (INDEPENDENT_AMBULATORY_CARE_PROVIDER_SITE_OTHER): Payer: Medicare Other | Admitting: Internal Medicine

## 2023-04-28 ENCOUNTER — Encounter: Payer: Self-pay | Admitting: Internal Medicine

## 2023-04-28 DIAGNOSIS — I480 Paroxysmal atrial fibrillation: Secondary | ICD-10-CM

## 2023-04-28 DIAGNOSIS — I959 Hypotension, unspecified: Secondary | ICD-10-CM

## 2023-04-28 DIAGNOSIS — Z8679 Personal history of other diseases of the circulatory system: Secondary | ICD-10-CM | POA: Diagnosis not present

## 2023-04-28 DIAGNOSIS — N186 End stage renal disease: Secondary | ICD-10-CM

## 2023-04-28 DIAGNOSIS — Z992 Dependence on renal dialysis: Secondary | ICD-10-CM | POA: Diagnosis not present

## 2023-04-28 LAB — COMPREHENSIVE METABOLIC PANEL
ALT: 16 U/L (ref 0–53)
AST: 11 U/L (ref 0–37)
Albumin: 3.7 g/dL (ref 3.5–5.2)
Alkaline Phosphatase: 94 U/L (ref 39–117)
BUN: 73 mg/dL — ABNORMAL HIGH (ref 6–23)
CO2: 28 meq/L (ref 19–32)
Calcium: 8 mg/dL — ABNORMAL LOW (ref 8.4–10.5)
Chloride: 97 meq/L (ref 96–112)
Creatinine, Ser: 16.14 mg/dL (ref 0.40–1.50)
GFR: 2.87 mL/min — CL (ref >60.00)
Glucose, Bld: 122 mg/dL — ABNORMAL HIGH (ref 70–99)
Potassium: 4.1 meq/L (ref 3.5–5.1)
Sodium: 142 meq/L (ref 135–145)
Total Bilirubin: 0.4 mg/dL (ref 0.2–1.2)
Total Protein: 6.5 g/dL (ref 6.0–8.3)

## 2023-04-28 LAB — VITAMIN D 25 HYDROXY (VIT D DEFICIENCY, FRACTURES): VITD: 17.17 ng/mL — ABNORMAL LOW (ref 30.00–100.00)

## 2023-04-28 LAB — PHOSPHORUS: Phosphorus: 5 mg/dL — ABNORMAL HIGH (ref 2.3–4.6)

## 2023-04-28 NOTE — Progress Notes (Signed)
 ==============================  [facility]: [phone]   -- Medical Office Visit --  Patient: Daniel Solomon      Age: 63 y.o.       Sex:  male  Date:   04/28/2023 Today's Healthcare Provider: Lula Olszewski, MD  ==============================   CHIEF COMPLAINT: Hospitalization Follow-up Daniel Solomon to ED on 2/21 for seizure like activity.) Emergency room follow up only, wasn't hospitalized, very low calcium after peritoneal dialysis noted at emergency room, he went there for paroxysm of shaking all over while lucid  SUBJECTIVE: Background This is a 63 y.o. 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 History of Present Illness Daniel Solomon "Tresa Endo" is a 63 year old male with a history of brain bleed and dialysis who presents with seizure-like activity post-dialysis. He is accompanied by his caregiver, who provides additional information about his condition.  He experienced seizure-like activity after completing peritoneal dialysis at home. The episode occurred while he was sitting in a chair reading, and his whole body started  to shake. He remained entirely lucid during the event, which lasted a couple of minutes. This episode was similar to a previous event following brain surgery, where he was also aware during the episode. His caregiver called 911 during the recent event. No changes in medication that would prevent low calcium levels. He has not experienced any other seizure-like episodes since the recent event.  He is on dialysis because his kidneys do not function properly. He performs peritoneal dialysis daily. His blood pressure has been low, which he attributes to the dialysis process. He is not currently on blood pressure medication. He has a history of kidney stones, but it is unclear if they contributed to his kidney failure. He has not had a kidney stone in many years.  He has a history of a brain bleed, which was spontaneous and possibly related to long-term blood thinner use and previously high blood pressure. He no longer takes blood thinners due to the risk of another bleed. He underwent brain surgery to address the bleed, and the blood was located on the outside, which was successfully removed.  He has a history of aortic valve replacement via TAVR and atrial fibrillation. He is not currently on anticoagulants due to the risk of bleeding.  He has been diagnosed with sleep apnea. His caregiver mentioned that his calcium levels were very low recently, which could have contributed to the seizure-like activity. He takes calcitriol three times a week to manage his calcium levels. He is also on cinacalcet (Sensipar) to manage his parathyroid hormone levels, which are elevated due to kidney disease.  Visually reviewed that patient  has a past medical history of Anemia, Anemia in chronic kidney disease (03/20/2020), Bicuspid aortic valve, Blood transfusion without reported diagnosis, CAD (coronary artery disease), Chicken pox, Dysrhythmia, Enlarged aorta ([name]), ESRD on peritoneal dialysis (  [name]), Glaucoma, Gout,  Headache, History of kidney stones, HTN (hypertension) (12/06/2010), Hypercholesterolemia, Hypertension, Mild aortic stenosis, PAF (paroxysmal atrial fibrillation) ([name]), Premature atrial contractions, S/P TAVR (transcatheter aortic valve replacement) (01/19/2021), SDH (subdural hematoma) ([name]) (08/2021), and Sleep apnea.  Verbally reviewed (per Abridge-generated extraction):  Past Medical History - History of bleeding in the brain - History of aortic valve disorder - History of sleep apnea - History of being overweight - History of high blood pressure - History of kidney stones - History of gout - History of atrial fibrillation - History of subdural hematoma Medications - Calcitriol - Cinacalcet or Sensipar Results LABS Calcium: 0.91 (04/21/2023) PTH: 1045 (04/28/2023) DIAGNOSTIC EKG: Normal (04/28/2023) Visually reviewed and manually updated: Current Outpatient Medications on File Prior to Visit  Medication Sig   allopurinol (ZYLOPRIM) 300 MG tablet TAKE 1 TABLET BY MOUTH EVERY DAY   amoxicillin (AMOXIL) 500 MG tablet Take FOUR tablets (2000 mg) by mouth ONE HOUR BEFORE ANY DENTAL APPOINTMENT (including cleanings)   atorvastatin (LIPITOR) 40 MG tablet TAKE 1 TABLET BY MOUTH EVERY DAY   AURYXIA 1 GM 210 MG(Fe) tablet Take 630 mg by mouth 3 (three) times daily with meals.   calcitRIOL (ROCALTROL) 0.25 MCG capsule Take 0.25 mcg by mouth daily.   cinacalcet (SENSIPAR) 60 MG tablet Take 60 mg by mouth daily.   gabapentin (NEURONTIN) 100 MG capsule Take 100 mg by mouth at bedtime.   gentamicin cream (GARAMYCIN) 0.1 % Apply 1 Application topically daily.   HYDROcodone-acetaminophen (NORCO/VICODIN) 5-325 MG tablet Take 1 tablet by mouth every 6 (six) hours as needed for severe pain. 1-2 tabs po tid prn   labetalol (NORMODYNE) 100 MG tablet Take 100 mg by mouth 2 (two) times daily.   levETIRAcetam (KEPPRA) 250 MG tablet Take 1 tablet (250 mg total) by mouth 2 (two) times daily.   levETIRAcetam  (KEPPRA) 500 MG tablet Take 1 tablet (500 mg total) by mouth 2 (two) times daily.   loratadine (CLARITIN) 10 MG tablet Take 10 mg by mouth daily.   methocarbamol (ROBAXIN) 500 MG tablet Take 2 tablets (1,000 mg total) by mouth 4 (four) times daily as needed (for Headache). START with 1 pill, if not too drowsy take 2 pills.   Methoxy PEG-Epoetin Beta (MIRCERA IJ) Inject into the skin as needed.   NON FORMULARY Pt uses a cpap nightly   Tenapanor HCl, CKD, (XPHOZAH) 30 MG TABS Take 30 mg by mouth in the morning and at bedtime.   torsemide (DEMADEX) 100 MG tablet TAKE 1 TABLET BY MOUTH EVERY DAY   No current facility-administered medications on file prior to visit.  There are no discontinued medications.    Objective   Physical Exam  BP 98/72   Pulse 93   Temp 98.2 F (36.8 C) (Temporal)   Ht 5\' 8"  (1.727 m)   Wt 222 lb 6.4 oz (100.9 kg)   SpO2 94%   BMI 33.82 kg/m   Wt Readings from Last 10 Encounters:  04/28/23 222 lb 6.4 oz (100.9 kg)  04/21/23 218 lb 4.1 oz (99 kg)  06/22/22 195 lb 15.8 oz (88.9 kg)  06/08/22 214 lb 8 oz (97.3 kg)  02/26/22 194 lb 14.2 oz (88.4 kg)  02/17/22 200 lb (90.7 kg)  01/19/22 191 lb 6.4 oz (86.8 kg)  10/11/21 198 lb 6.6 oz (90 kg)  09/28/21 194 lb (88 kg)  09/22/21 212 lb 8.4 oz (96.4 kg)  Vital signs reviewed.  Nursing notes reviewed. Weight trend reviewed. Discussed low  blood pressure with patient  Physical Exam General Appearance:  No acute distress appreciable.   Well-groomed, healthy-appearing male.  Well proportioned with no abnormal fat distribution.  Good muscle tone.  Presents with wife, no tremulousness. Truncal adiposity  Pulmonary:  Normal work of breathing at rest, no respiratory distress apparent. SpO2: 94 %  Musculoskeletal: All extremities are intact.  Neurological:  Awake, alert, oriented, and engaged.  No obvious focal neurological deficits or cognitive impairments.  Sensorium seems unclouded.   Speech is clear and coherent with  logical content. Psychiatric:  Appropriate mood, pleasant and cooperative demeanor, thoughtful and engaged during the exam   Admission on 04/21/2023, Discharged on 04/21/2023  Component Date Value   Glucose-Capillary 04/21/2023 83    WBC 04/21/2023 7.3    RBC 04/21/2023 3.34 (L)    Hemoglobin 04/21/2023 11.9 (L)    HCT 04/21/2023 36.0 (L)    MCV 04/21/2023 107.8 (H)    MCH 04/21/2023 35.6 (H)    MCHC 04/21/2023 33.1    RDW 04/21/2023 13.7    Platelets 04/21/2023 114 (L)    nRBC 04/21/2023 0.0    Neutrophils Relative % 04/21/2023 73    Neutro Abs 04/21/2023 5.4    Lymphocytes Relative 04/21/2023 14    Lymphs Abs 04/21/2023 1.0    Monocytes Relative 04/21/2023 9    Monocytes Absolute 04/21/2023 0.7    Eosinophils Relative 04/21/2023 2    Eosinophils Absolute 04/21/2023 0.2    Basophils Relative 04/21/2023 1    Basophils Absolute 04/21/2023 0.1    Immature Granulocytes 04/21/2023 1    Abs Immature Granulocytes 04/21/2023 0.06    Sodium 04/21/2023 138    Potassium 04/21/2023 5.1    Chloride 04/21/2023 99    CO2 04/21/2023 20 (L)    Glucose, Bld 04/21/2023 94    BUN 04/21/2023 80 (H)    Creatinine, Ser 04/21/2023 15.49 (H)    Calcium 04/21/2023 7.8 (L)    Total Protein 04/21/2023 6.8    Albumin 04/21/2023 3.5    AST 04/21/2023 14 (L)    ALT 04/21/2023 30    Alkaline Phosphatase 04/21/2023 103    Total Bilirubin 04/21/2023 0.8    GFR, Estimated 04/21/2023 3 (L)    Anion gap 04/21/2023 19 (H)    Sodium 04/21/2023 137    Potassium 04/21/2023 5.0    Chloride 04/21/2023 102    BUN 04/21/2023 71 (H)    Creatinine, Ser 04/21/2023 17.90 (H)    Glucose, Bld 04/21/2023 86    Calcium, Ion 04/21/2023 0.91 (L)    TCO2 04/21/2023 23    Hemoglobin 04/21/2023 12.6 (L)    HCT 04/21/2023 37.0 (L)    SARS Coronavirus 2 by RT* 04/21/2023 NEGATIVE    Influenza A by PCR 04/21/2023 NEGATIVE    Influenza B by PCR 04/21/2023 NEGATIVE    Resp Syncytial Virus by * 04/21/2023 NEGATIVE    Admission on 06/17/2022, Discharged on 06/26/2022  Component Date Value   Sodium 06/17/2022 138    Potassium 06/17/2022 4.5    Chloride 06/17/2022 104    BUN 06/17/2022 78 (H)    Creatinine, Ser 06/17/2022 14.10 (H)    Glucose, Bld 06/17/2022 85    Calcium, Ion 06/17/2022 1.16    TCO2 06/17/2022 24    Hemoglobin 06/17/2022 14.3    HCT 06/17/2022 42.0    WBC 06/17/2022 9.2    RBC 06/17/2022 3.72 (L)    Hemoglobin 06/17/2022 12.4 (L)    HCT 06/17/2022 37.1 (L)    MCV  06/17/2022 99.7    MCH 06/17/2022 33.3    MCHC 06/17/2022 33.4    RDW 06/17/2022 13.8    Platelets 06/17/2022 102 (L)    nRBC 06/17/2022 0.0    Creatinine, Ser 06/17/2022 13.34 (H)    GFR, Estimated 06/17/2022 4 (L)    MRSA by PCR Next Gen 06/17/2022 NOT DETECTED    Sodium 06/22/2022 142    Potassium 06/22/2022 4.7    Chloride 06/22/2022 103    CO2 06/22/2022 24    Glucose, Bld 06/22/2022 98    BUN 06/22/2022 82 (H)    Creatinine, Ser 06/22/2022 13.86 (H)    Calcium 06/22/2022 9.2    Phosphorus 06/22/2022 8.4 (H)    Albumin 06/22/2022 2.5 (L)    GFR, Estimated 06/22/2022 4 (L)    Anion gap 06/22/2022 15    WBC 06/22/2022 7.6    RBC 06/22/2022 3.14 (L)    Hemoglobin 06/22/2022 10.3 (L)    HCT 06/22/2022 31.9 (L)    MCV 06/22/2022 101.6 (H)    MCH 06/22/2022 32.8    MCHC 06/22/2022 32.3    RDW 06/22/2022 14.0    Platelets 06/22/2022 96 (L)    nRBC 06/22/2022 0.0    WBC 06/26/2022 8.1    RBC 06/26/2022 3.57 (L)    Hemoglobin 06/26/2022 11.9 (L)    HCT 06/26/2022 35.6 (L)    MCV 06/26/2022 99.7    MCH 06/26/2022 33.3    MCHC 06/26/2022 33.4    RDW 06/26/2022 14.0    Platelets 06/26/2022 119 (L)    nRBC 06/26/2022 0.0    Sodium 06/26/2022 143    Potassium 06/26/2022 4.7    Chloride 06/26/2022 100    CO2 06/26/2022 24    Glucose, Bld 06/26/2022 110 (H)    BUN 06/26/2022 77 (H)    Creatinine, Ser 06/26/2022 16.09 (H)    Calcium 06/26/2022 9.8    Phosphorus 06/26/2022 9.2 (H)    Albumin  06/26/2022 3.0 (L)    GFR, Estimated 06/26/2022 3 (L)    Anion gap 06/26/2022 19 (H)   Hospital Outpatient Visit on 06/08/2022  Component Date Value   Sodium 06/08/2022 139    Potassium 06/08/2022 4.2    Chloride 06/08/2022 100    CO2 06/08/2022 25    Glucose, Bld 06/08/2022 93    BUN 06/08/2022 65 (H)    Creatinine, Ser 06/08/2022 11.45 (H)    Calcium 06/08/2022 9.6    GFR, Estimated 06/08/2022 5 (L)    Anion gap 06/08/2022 14    WBC 06/08/2022 7.5    RBC 06/08/2022 3.74 (L)    Hemoglobin 06/08/2022 12.5 (L)    HCT 06/08/2022 38.5 (L)    MCV 06/08/2022 102.9 (H)    MCH 06/08/2022 33.4    MCHC 06/08/2022 32.5    RDW 06/08/2022 14.0    Platelets 06/08/2022 103 (L)    nRBC 06/08/2022 0.0    ABO/RH(D) 06/08/2022 O POS    Antibody Screen 06/08/2022 NEG    Sample Expiration 06/08/2022 06/22/2022,2359    Extend sample reason 06/08/2022                     Value:NO TRANSFUSIONS OR PREGNANCY IN THE PAST 3 MONTHS Performed at [facility], [address], [place], [place] [address]   No image results found. CT Head Wo Contrast Result Date: 04/21/2023 CLINICAL DATA:  Seizure, new onset no history of trauma. EXAM: CT HEAD WITHOUT CONTRAST TECHNIQUE: Contiguous axial images were obtained  from the base of the skull through the vertex without intravenous contrast. RADIATION DOSE REDUCTION: This exam was performed according to the departmental dose-optimization program which includes automated exposure control, adjustment of the mA and/or kV according to patient size and/or use of iterative reconstruction technique. COMPARISON:  CT head without contrast 12/02/2022. FINDINGS: Brain: No acute infarct, hemorrhage, or mass lesion is present. Previous craniectomy is again noted. Progressive thickening calcification of the dura is noted. Punctate calcification in the left frontal operculum is noted. No significant white matter lesions are present. Deep brain nuclei are within normal limits.  Increased CSF density extradural collection is present on the left. No significant extracranial fluid collection is present as has been seen in the past. The brainstem and cerebellum are within normal limits. Midline structures are within normal limits. Vascular: Atherosclerotic calcifications are present within the cavernous internal carotid arteries and at the dural margin of both vertebral arteries. Skull: Stem the cranioplasty is stable. Sinuses/Orbits: The paranasal sinuses and mastoid air cells are clear. Bilateral lens replacements are noted. Globes and orbits are otherwise unremarkable. IMPRESSION: 1. No acute intracranial abnormality or significant interval change. 2. Previous left craniectomy and cranioplasty. 3. Progressive thickening and calcification of the dura. 4. Increased CSF density extradural collection on the left. No significant extracranial fluid collection is present as has been seen in the past. This does not create any mass effect. Consider short-term follow-up CT to assess stability of this collection in 2-3 months unless the patient has additional seizures or other symptoms. Electronically Signed   By: Marin Roberts M.D.   On: 04/21/2023 13:02   DG Chest Port 1 View Result Date: 04/21/2023 CLINICAL DATA:  Seizure and syncope EXAM: PORTABLE CHEST 1 VIEW COMPARISON:  Chest radiograph dated 06/19/2022 FINDINGS: Normal lung volumes. Bibasilar patchy opacities. No pleural effusion or pneumothorax. Similar cardiomediastinal silhouette status post aortic valve replacement. No radiographic finding of acute displaced fracture. IMPRESSION: Bibasilar patchy opacities, which may reflect atelectasis, aspiration, or pneumonia. Electronically Signed   By: Agustin Cree M.D.   On: 04/21/2023 10:27  CT Head Wo Contrast Result Date: 04/21/2023 CLINICAL DATA:  Seizure, new onset no history of trauma. EXAM: CT HEAD WITHOUT CONTRAST TECHNIQUE: Contiguous axial images were obtained from the base of the  skull through the vertex without intravenous contrast. RADIATION DOSE REDUCTION: This exam was performed according to the departmental dose-optimization program which includes automated exposure control, adjustment of the mA and/or kV according to patient size and/or use of iterative reconstruction technique. COMPARISON:  CT head without contrast 12/02/2022. FINDINGS: Brain: No acute infarct, hemorrhage, or mass lesion is present. Previous craniectomy is again noted. Progressive thickening calcification of the dura is noted. Punctate calcification in the left frontal operculum is noted. No significant white matter lesions are present. Deep brain nuclei are within normal limits. Increased CSF density extradural collection is present on the left. No significant extracranial fluid collection is present as has been seen in the past. The brainstem and cerebellum are within normal limits. Midline structures are within normal limits. Vascular: Atherosclerotic calcifications are present within the cavernous internal carotid arteries and at the dural margin of both vertebral arteries. Skull: Stem the cranioplasty is stable. Sinuses/Orbits: The paranasal sinuses and mastoid air cells are clear. Bilateral lens replacements are noted. Globes and orbits are otherwise unremarkable. IMPRESSION: 1. No acute intracranial abnormality or significant interval change. 2. Previous left craniectomy and cranioplasty. 3. Progressive thickening and calcification of the dura. 4. Increased CSF density extradural collection  on the left. No significant extracranial fluid collection is present as has been seen in the past. This does not create any mass effect. Consider short-term follow-up CT to assess stability of this collection in 2-3 months unless the patient has additional seizures or other symptoms. Electronically Signed   By: Marin Roberts M.D.   On: 04/21/2023 13:02   DG Chest Port 1 View Result Date: 04/21/2023 CLINICAL DATA:   Seizure and syncope EXAM: PORTABLE CHEST 1 VIEW COMPARISON:  Chest radiograph dated 06/19/2022 FINDINGS: Normal lung volumes. Bibasilar patchy opacities. No pleural effusion or pneumothorax. Similar cardiomediastinal silhouette status post aortic valve replacement. No radiographic finding of acute displaced fracture. IMPRESSION: Bibasilar patchy opacities, which may reflect atelectasis, aspiration, or pneumonia. Electronically Signed   By: Agustin Cree M.D.   On: 04/21/2023 10:27      Assessment & Plan Hypocalcemia Seizure-like activity post-dialysis is likely due to hypocalcemia (0.91 mmol/L). Risks of low calcium, including seizures, tetany, and cardiac issues, were discussed. Emphasized managing calcium levels to prevent recurrence. Order a calcium panel, including PTH and vitamin D levels. Send lab results to the nephrologist and advise calling him today to discuss calcium management. Send a note with today's findings and recommendations. ESRD on peritoneal dialysis [facility]) On peritoneal dialysis due to chronic kidney disease. High BUN levels necessitate daily dialysis. Discussed managing protein intake to reduce BUN levels and potentially decrease dialysis frequency. Discuss protein intake with the nephrologist and consider a low protein diet. Paroxysmal atrial fibrillation ([name]) History of atrial fibrillation off anticoagulation with potential exacerbation due to low calcium levels. Monitor calcium levels closely to prevent AFib exacerbation. History of subdural hematoma Experienced a spontaneous subdural hematoma, likely exacerbated by long-term anticoagulant use and previously high blood pressure. No longer on anticoagulants due to bleeding risk. Discussed potential benefits of a Watchman device for stroke prevention, noting an 8-10% annual risk of non-bleeding stroke without anticoagulants. Consider a Watchman device and discuss with a cardiologist. Hypotension, unspecified hypotension type Blood  pressure is currently low, likely due to dialysis. Not on any antihypertensive medications. Monitor blood pressure regularly. General Health Maintenance Reviewed overall health and emphasized managing chronic conditions to maintain quality of life. Encourage enjoying quality of life and discuss potential lifestyle modifications with specialists.  Follow-up Follow up with the nephrologist today. Call around 1 PM to discuss lab results and medication adjustments. Send today's note and lab results to the nephrologist.      Orders Placed During this Encounter:   Orders Placed This Encounter  Procedures   Comp Met (CMET)   PTH, Intact and Calcium   Vitamin D (25 hydroxy)   Phosphorus   No orders of the defined types were placed in this encounter.      **This document was synthesized by artificial intelligence (Abridge) using HIPAA-compliant recording of the clinical interaction;   We discussed the use of AI scribe software for clinical note transcription with the patient, who gave verbal consent to proceed.    Additional Info: This encounter employed state-of-the-art, real-time, collaborative documentation. The patient actively reviewed and assisted in updating their electronic medical record on a shared screen, ensuring transparency and facilitating joint problem-solving for the problem list, overview, and plan. This approach promotes accurate, informed care. The treatment plan was discussed and reviewed in detail, including medication safety, potential side effects, and all patient questions. We confirmed understanding and comfort with the plan. Follow-up instructions were established, including contacting the office for any concerns, returning if symptoms worsen,  persist, or new symptoms develop, and precautions for potential emergency department visits.

## 2023-04-28 NOTE — Telephone Encounter (Signed)
 Daniel Solomon from [facility] called to give critical lab results: Creatinine: 16.14 and GFR: 2.87. Verbally told Dr. Ruthine Dose. Note sent to Dr. Ruthine Dose and Dr. Durene Cal. Please advise.

## 2023-04-28 NOTE — Progress Notes (Signed)
 Please help guide patient on how to adjust his calcium/calcitriol/sensipar/peritoneal dialysis etc for this situation where he last week had seizure like activity associated with low calcium high bun.

## 2023-04-29 NOTE — Assessment & Plan Note (Signed)
 Experienced a spontaneous subdural hematoma, likely exacerbated by long-term anticoagulant use and previously high blood pressure. No longer on anticoagulants due to bleeding risk. Discussed potential benefits of a Watchman device for stroke prevention, noting an 8-10% annual risk of non-bleeding stroke without anticoagulants. Consider a Watchman device and discuss with a cardiologist.

## 2023-04-29 NOTE — Assessment & Plan Note (Signed)
 History of atrial fibrillation off anticoagulation with potential exacerbation due to low calcium levels. Monitor calcium levels closely to prevent AFib exacerbation.

## 2023-04-29 NOTE — Patient Instructions (Signed)
 VISIT SUMMARY:  Today, we discussed your recent seizure-like activity following dialysis, which is likely due to low calcium levels. We also reviewed your history of atrial fibrillation, chronic kidney disease, and past brain bleed. We emphasized the importance of managing your calcium levels and overall health to prevent future complications.  YOUR PLAN:  -HYPOCALCEMIA: Hypocalcemia means low calcium levels in the blood, which can cause seizures, muscle spasms, and heart problems. We will order a calcium panel, including parathyroid hormone (PTH) and vitamin D levels, and send the results to your nephrologist. Please call your nephrologist today to discuss managing your calcium levels.  -ATRIAL FIBRILLATION: Atrial fibrillation is an irregular and often rapid heart rate that can increase the risk of stroke. We will monitor your calcium levels closely to prevent any exacerbation of atrial fibrillation.  -CHRONIC KIDNEY DISEASE ON DIALYSIS: Chronic kidney disease means your kidneys are not working properly, and you need dialysis to filter waste from your blood. We discussed managing your protein intake to reduce blood urea nitrogen (BUN) levels and potentially decrease the frequency of dialysis. Please discuss this with your nephrologist and consider a low protein diet.  -SUBDURAL HEMATOMA: A subdural hematoma is a collection of blood outside the brain, often caused by head injury or, in your case, possibly long-term use of blood thinners. We discussed the potential benefits of a Watchman device to prevent stroke, as you are not on anticoagulants. Please consider this option and discuss it with your cardiologist.  -HYPERTENSION (RESOLVED): Your blood pressure is currently low, likely due to dialysis, and you are not on any blood pressure medications. We recommend monitoring your blood pressure regularly.  -GENERAL HEALTH MAINTENANCE: We reviewed your overall health and emphasized managing your chronic  conditions to maintain your quality of life. Please enjoy your daily activities and discuss any potential lifestyle changes with your specialists.  INSTRUCTIONS:  Please follow up with your nephrologist today around 1 PM to discuss your lab results and any necessary medication adjustments. We will send today's note and lab results to your nephrologist.

## 2023-04-29 NOTE — Telephone Encounter (Signed)
 Lab Results  Component Value Date/Time   CREATININE 16.14 (HH) 04/28/2023 10:08 AM   CREATININE 17.90 (H) 04/21/2023 09:07 AM   CREATININE 15.49 (H) 04/21/2023 09:02 AM   CREATININE 16.09 (H) 06/26/2022 01:12 AM   CREATININE 13.86 (H) 06/22/2022 03:59 AM   CREATININE 1.81 (H) 01/28/2016 08:02 AM   CREATININE 1.81 (H) 01/12/2016 08:49 AM  This is not a critical creatinine for this patient with end stage renal disease on dialysis on peritoneal dialysis I got this for calcium and shared results to nephrology

## 2023-04-29 NOTE — Assessment & Plan Note (Signed)
 On peritoneal dialysis due to chronic kidney disease. High BUN levels necessitate daily dialysis. Discussed managing protein intake to reduce BUN levels and potentially decrease dialysis frequency. Discuss protein intake with the nephrologist and consider a low protein diet.

## 2023-05-02 LAB — PTH, INTACT AND CALCIUM
Calcium: 7.9 mg/dL — ABNORMAL LOW (ref 8.6–10.3)
PTH: 540 pg/mL — ABNORMAL HIGH (ref 16–77)

## 2023-05-08 ENCOUNTER — Encounter: Payer: Self-pay | Admitting: Family Medicine

## 2023-05-08 ENCOUNTER — Ambulatory Visit (INDEPENDENT_AMBULATORY_CARE_PROVIDER_SITE_OTHER): Payer: Managed Care, Other (non HMO) | Admitting: Family Medicine

## 2023-05-08 DIAGNOSIS — I2510 Atherosclerotic heart disease of native coronary artery without angina pectoris: Secondary | ICD-10-CM

## 2023-05-08 DIAGNOSIS — Z992 Dependence on renal dialysis: Secondary | ICD-10-CM

## 2023-05-08 DIAGNOSIS — N2581 Secondary hyperparathyroidism of renal origin: Secondary | ICD-10-CM

## 2023-05-08 DIAGNOSIS — E782 Mixed hyperlipidemia: Secondary | ICD-10-CM

## 2023-05-08 DIAGNOSIS — Z8679 Personal history of other diseases of the circulatory system: Secondary | ICD-10-CM

## 2023-05-08 DIAGNOSIS — I480 Paroxysmal atrial fibrillation: Secondary | ICD-10-CM

## 2023-05-08 DIAGNOSIS — N186 End stage renal disease: Secondary | ICD-10-CM

## 2023-05-08 NOTE — Patient Instructions (Addendum)
 Hold off on labs- thanks for showing me labs you have had done- seem to improving  Recommended follow up: Return in about 6 months (around 11/08/2023) for followup or sooner if needed.Schedule b4 you leave.

## 2023-05-08 NOTE — Progress Notes (Signed)
 Phone [phone] In person visit   Subjective:   Daniel Solomon is a 63 y.o. 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 03/20/2020    Priority: High   Dilated aortic root ([name]) 12/14/2017    Priority: High   Paroxysmal atrial fibrillation ([name]) 03/02/2016    Priority: High   Branch retinal artery occlusion 03/01/2016    Priority: High   CAD (coronary artery disease) 12/06/2010    Priority: High   Anemia in chronic kidney disease 03/20/2020    Priority: Medium    Secondary hyperparathyroidism of renal origin ([name]) 03/20/2020    Priority: Medium    Gout     Priority: Medium    OSA (obstructive sleep apnea) 12/06/2010    Priority: Medium    Mixed hyperlipidemia 12/06/2010    Priority: Medium    Bradycardia 12/06/2010    Priority: Medium    History of urinary stone 03/20/2020    Priority: Low   Carotid bruit 02/09/2012    Priority: Low   Skull defect 06/17/2022   Postoperative CSF leak 06/17/2022   Status post craniectomy 02/25/2022   S/P craniotomy 10/05/2021   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     calcitRIOL (ROCALTROL) 0.25 MCG capsule Take 0.25 mcg by mouth. 3x a week per Dr. Marisue Humble     cinacalcet (SENSIPAR) 60 MG tablet Take 60 mg by mouth daily.     gabapentin (NEURONTIN) 100 MG capsule Take 100 mg by mouth at bedtime.     gentamicin cream (GARAMYCIN) 0.1 % Apply 1 Application topically daily.     loratadine (CLARITIN) 10 MG tablet Take 10 mg by mouth daily.     Methoxy PEG-Epoetin Beta (MIRCERA IJ) Inject into the skin as needed.     NON FORMULARY Pt uses a cpap nightly     Tenapanor HCl, CKD, (XPHOZAH) 30 MG TABS Take 30 mg by mouth in the morning and at bedtime.     No current facility-administered medications for this visit.     Objective:  BP 105/70   Pulse 77   Temp 97.9 F (36.6 C)   Ht 5\' 8"  (1.727 m)   Wt 220 lb (99.8 kg)   SpO2 97%   BMI 33.45 kg/m  Gen: NAD, resting comfortably CV: RRR no murmurs rubs or gallops Lungs: CTAB no crackles, wheeze, rhonchi Ext: trace edema  Skin: warm, dry     Assessment and Plan   # Hypocalcemia S: Patient developed very low calcium after peritoneal dialysis and developed seizure-like activity require emergency room visit on 04/21/2023.  He was seen by Dr. Jon Billings a week later.  History of similar episode following brain surgery.  He was aware the entire situation -Today patient reports he had recently went back tonight with cycler instead of in the day with manual bag changes due to kt/v ratio being off. Happened on first night of this change but he has seen kt/v ratio and calcium improving since that time with labs with peritoneal dialysis. For instance reports 8.4 on calcium on 05/03/23 and 713 PTH with normal range reported with range 160-720 and phosphorus in normal range. He reports kt/v improving but still not at goal. Sees Dr. Marisue Humble in 2 weeks.  -he  reflects back and reports the seizure-like activity happened while reading-he noted some tics that seem to worsen before he had the episode.  A few days later he was doing dialysis reading a book and felt worsening tics and he took 2 Tums and was better but went back to reading and seem to recur and did not resolve with Tums so he stopped reading and this stopped.  Today had some more issues and stopped reading and symptoms resolved.  Reports typically this is first thing in the morning  Patient is on calcitriol 3 times a week to manage calcium as well as Cinacalcet/Sensipar to manage parathyroid hormone levels - Dr. Jon Billings ordered calcium still slightly low at 8, PTH, vitamin D low at 17, phosphorus slightly high at 5 and forwarded labs to nephrology A/P: I was able to talk with Dr. Marisue Humble by phone and he was able to review the labs and the improving trends-I had told patient before he left to make no changes unless Dr. Marisue Humble desired for changes to be made but I called him back after visit to confirm Dr. 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 Su Monks with [facility]:-Primary nephrologist is Dr. Marisue Humble -Patient reports multiple challenges getting on transplant list-has been cleared from pulmonary perspective with pulmonary nodules but apparently awaiting MRI in March 2025 of larger renal cysts  -Reports EPO treatments as needed for anemia and he also takes aruyxia/iron  -Gabapentin helps itching-also better when phosphorus is in normal range-on xphozah  -Secondary  hyperparathyroidism managed with calcitriol and Sensipar A/P: ESRD being adequately managed with peritoneal dialysis.  Patient is hopeful to get on the transplant list-he is trying to go through the appropriate steps for this right now.  Continue all current medications-see discussion above about secondary hyperparathyroidism   # Paroxysmal atrial fibrillation S: Rate controlled with no medication Anticoagulated with no medication-patient declines with prior subdural hematoma from 09/22/2021 -Watchman has been considered-patient is thinking this over  A/P: Rate controlled without medicine.  Not anticoagulated per strong patient preference-discussed watchman again today but he wants to hold off for now and continue to consider this   # CAD # Hyperlipidemia # Aortic stenosis status post TAVR S:Medication: Atorvastatin 40 mg,  off of aspirin or anticoagulant due to prior subdural hematoma per his strong preference -No chest pain reported A/P: CAD is asymptomatic.  Last lipids we have on file or well-controlled and patient remains on current medicine-reports has this monitored with nephrology-hopefully we will get a copy of that in the future  #Gout S: Medication: Allopurinol 300 mg As needed: None-denies gout flares A/P: Doing well without intervention-continue to monitor  Recommended follow up: Return in about 6 months (around 11/08/2023) for followup or sooner if needed.Schedule b4 you leave.  Patient desires to minimize doctor visits with all visits he already has-we will respect his decision-possibly see him back in a year   Lab/Order associations:   ICD-10-CM   1. Coronary artery disease involving native coronary artery of native heart without angina pectoris  I25.10     2. Paroxysmal atrial fibrillation ([name])  I48.0     3. ESRD on peritoneal dialysis ([name])  N18.6    Z99.2     4. History of subdural hematoma  Z86.79     5. Mixed hyperlipidemia  E78.2     6. Secondary  hyperparathyroidism of renal origin ([name])  N25.81       No orders of the defined types were placed in this encounter.   Return precautions advised.  Tana Conch, MD

## 2023-06-02 ENCOUNTER — Telehealth: Payer: Self-pay | Admitting: Cardiovascular Disease

## 2023-06-02 NOTE — Telephone Encounter (Signed)
 Daniel Solomon, Pre-Transplant Coordinator with [facility] returning nurse call. Please advise

## 2023-06-02 NOTE — Telephone Encounter (Signed)
 Called and spoke w transplant coordinator.  Adv pt has appointment with Dr. Clifton James 06/05/23 to review results of NM stress test done in Dec 2024.

## 2023-06-02 NOTE — Telephone Encounter (Signed)
 Received call from an Morrie Sheldon, Pre-Transplant Coordinator with [facility].--she would like to know if it would be alright to have patient come in for committee at 1:00 PM this afternoon to present his case. She says patient is currently listed, but they are trying to make him active. She is extremely hopeful for feedback if at all possible, and plans to fax stress test to the office. See ED notes--due to brain injury patient may not have arranged correspondence when initially advised to + additional pressing matters. She says patient A call may be returned to Ashley's direct office line + secure voicemail at [phone].

## 2023-06-02 NOTE — Telephone Encounter (Signed)
 Left message to call back

## 2023-06-05 ENCOUNTER — Encounter: Payer: Self-pay | Admitting: Cardiovascular Disease

## 2023-06-05 ENCOUNTER — Ambulatory Visit: Attending: Cardiovascular Disease | Admitting: Cardiovascular Disease

## 2023-06-05 VITALS — BP 86/58 | HR 96 | Ht 68.0 in | Wt 218.6 lb

## 2023-06-05 DIAGNOSIS — Z992 Dependence on renal dialysis: Secondary | ICD-10-CM | POA: Diagnosis present

## 2023-06-05 DIAGNOSIS — Z952 Presence of prosthetic heart valve: Secondary | ICD-10-CM | POA: Diagnosis present

## 2023-06-05 DIAGNOSIS — E7800 Pure hypercholesterolemia, unspecified: Secondary | ICD-10-CM | POA: Insufficient documentation

## 2023-06-05 DIAGNOSIS — N186 End stage renal disease: Secondary | ICD-10-CM | POA: Insufficient documentation

## 2023-06-05 DIAGNOSIS — I10 Essential (primary) hypertension: Secondary | ICD-10-CM | POA: Diagnosis not present

## 2023-06-05 DIAGNOSIS — I4819 Other persistent atrial fibrillation: Secondary | ICD-10-CM | POA: Insufficient documentation

## 2023-06-05 DIAGNOSIS — R9439 Abnormal result of other cardiovascular function study: Secondary | ICD-10-CM | POA: Insufficient documentation

## 2023-06-05 DIAGNOSIS — I2510 Atherosclerotic heart disease of native coronary artery without angina pectoris: Secondary | ICD-10-CM | POA: Diagnosis present

## 2023-06-05 DIAGNOSIS — Z01812 Encounter for preprocedural laboratory examination: Secondary | ICD-10-CM | POA: Insufficient documentation

## 2023-06-05 LAB — CBC

## 2023-06-05 MED ORDER — ASPIRIN 81 MG PO TBEC
81.0000 mg | DELAYED_RELEASE_TABLET | Freq: Every day | ORAL | Status: AC
Start: 2023-06-05 — End: ?

## 2023-06-05 NOTE — Patient Instructions (Signed)
 Medication Instructions:  No changes *If you need a refill on your cardiac medications before your next appointment, please call your pharmacy*  Lab Work: Today: cbc, bmet, lipids, liver at [facility]   Testing/Procedures: Your physician has requested that you have a cardiac catheterization. Cardiac catheterization is used to diagnose and/or treat various heart conditions. Doctors may recommend this procedure for a number of different reasons. The most common reason is to evaluate chest pain. Chest pain can be a symptom of coronary artery disease (CAD), and cardiac catheterization can show whether plaque is narrowing or blocking your heart's arteries. This procedure is also used to evaluate the valves, as well as measure the blood flow and oxygen levels in different parts of your heart. For further information please visit [email or web address] Please follow instruction sheet, as given.   Follow-Up: 3-4 weeks with Dr. Clifton James or PA or NP (Advanced Practice Providers)         Cardiac/Peripheral Catheterization   You are scheduled for a Cardiac Catheterization on Friday, April 11 with Dr. Verne Carrow.  1. Please arrive at the [facility] (Main Entrance A) at [facility]: [address] [place], [place] [address] at 8:30 AM (This time is TWO hour(s) before your procedure to ensure your preparation).   Free valet parking service is available. You will check in at ADMITTING. The support person will be asked to wait in the waiting room.  It is OK to have someone drop you off and come back when you are ready to be discharged.        Special note: Every effort is made to have your procedure done on time. Please understand that emergencies sometimes delay scheduled procedures.  2. Diet: Do not eat solid foods after midnight.  You may have clear liquids until 5 AM the day of the procedure.  3. Labs: You will need to have blood drawn today.  You do not need to be fasting.  4.  Medication instructions in preparation for your procedure:   Contrast Allergy: No   On the morning of your procedure, take Aspirin 81 mg and any morning medicines NOT listed above.  You may use sips of water.  5. Plan to go home the same day, you will only stay overnight if medically necessary. 6. You MUST have a responsible adult to drive you home. 7. An adult MUST be with you the first 24 hours after you arrive home. 8. Bring a current list of your medications, and the last time and date medication taken. 9. Bring ID and current insurance cards. 10.Please wear clothes that are easy to get on and off and wear slip-on shoes.  Thank you for allowing [place] to care for you!   -- [facility]       1st Floor: - Lobby - Registration  - Pharmacy  - Lab - Cafe  2nd Floor: - PV Lab - Diagnostic Testing (echo, CT, nuclear med)  3rd Floor: - Vacant  4th Floor: - TCTS (cardiothoracic surgery) - AFib Clinic - Structural Heart Clinic - Vascular Surgery  - Vascular Ultrasound  5th Floor: - [facility] (general and EP) - Clinical Pharmacy for coumadin, hypertension, lipid, weight-loss medications, and med management appointments    Valet parking services will be available as well.

## 2023-06-05 NOTE — H&P (View-Only) (Signed)
 Chief Complaint  Patient presents with   Follow-up    CAD, abnormal stress test    History of Present Illness: 63 yo male with history of ESRD on PD, CAD, HTN, HLD, sinus bradycardia, bicuspid aortic valve with severe AS s/p TAVR,  sleep apnea, thoracic aortic aneurysm and persistent atrial fibrillation here today for cardiac follow up. He was admitted with unstable angina in September 2012 and was found to have severe stenosis in the mid to distal LAD treated with a 2.25 x 18 mm Resolute DES. He was also found to have moderate mid LAD and distal RCA disease. In December 2017 he was found to have a branch retinal artery occlusion in his right eye. He was in atrial fibrillation at time of office visit here in January 2018. He was started on Xarelto January 2018. He was followed for bicuspid aortic stenosis. This progressed to severe AS and he underwent TAVR in November 2022. Cardiac cath October 2022 with patent LAD stent, moderate diffuse mid LAD stenosis, moderate distal stenosis. Echo November 2023 with LVEF=60%, normally functioning TAVR valve with mean gradient of 10 mmHg, no PVL, mild MR. He has been off of his Eliquis after a spontaneous subdural hematoma last year.   He is trying to get onto the active kidney transplant list. Nuclear stress test at [facility] in December 2024 with possible anterior wall ischemia. This was arranged pre-transplant. I was not aware of this test until it was sent over last week by the transplant team.   He is here today for follow up. The patient denies any chest pain, dyspnea, palpitations, lower extremity edema, orthopnea, PND, dizziness, near syncope or syncope.   Primary Care Physician: Shelva Majestic, MD  Past Medical History:  Diagnosis Date   Anemia    Anemia in chronic kidney disease 03/20/2020   Bicuspid aortic valve    Blood transfusion without reported diagnosis    CAD (coronary artery disease)    proximal LAD 50%, mid LAD 50-60%,  mid to distal LAD 99%; mid circumflex 20%; proximal RCA 20%, distal RCA 50%; PDA 60-70%, proximal posterior AV groove 50%, proximal posterior lateral 50%.  There was a question of possible LVOT gradient;  s/p Resolute DES to mid-distal LAD 10/2010;  echocardiogram 11/17/10: EF 55-60%, mild LVH, grade 1 diastolic dysfunction, normal aortic valve, mild MR, PASP 32.    Chicken pox    Dysrhythmia    A. Fib   Enlarged aorta ([name])    ESRD on peritoneal dialysis ([name])    Glaucoma    Gout    allopurinol 300mg , colchicine prn in past, has not had flares   Headache    History of kidney stones    HTN (hypertension) 12/06/2010   Lisinopril 20mg ,  amlodipine 5mg . Home cuff runs high- likely 20/10 higher at least.      HCTZ- stopped by Dr. Hyman Hopes     Hypercholesterolemia    With hypertriglyceridemia   Hypertension    Mild aortic stenosis    PAF (paroxysmal atrial fibrillation) ([name])    a. Documented on event monitor 01/2016.   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   SDH (subdural hematoma) ([name]) 08/2021   left   Sleep apnea    cpap nightly    Past Surgical History:  Procedure Laterality Date   COLONOSCOPY  2018   CORONARY STENT PLACEMENT  02/28/2010   CRANIOPLASTY N/A 02/25/2022   Procedure: Cranioplasty with custom plate;  Surgeon: Coletta Memos, MD;  Location[facility] OR;  Service: Neurosurgery;  Laterality: N/A;   CRANIOPLASTY N/A 06/17/2022   Procedure: REVISION OF CRANIOPLASTY/PLACEMENT OF LUMBAR DRAIN;  Surgeon: Coletta Memos, MD;  Location: [facility] OR;  Service: Neurosurgery;  Laterality: N/A;   CRANIOTOMY Left 10/05/2021   Procedure: CRANIOTOMY HEMATOMA EVACUATION SUBDURAL;  Surgeon: Coletta Memos, MD;  Location: [facility] OR;  Service: Neurosurgery;  Laterality: Left;   CRANIOTOMY Left 10/07/2021   Procedure: Craniectomy for Recurrent  Intracranial Hematoma;   Surgeon: Coletta Memos, MD;  Location: [facility] OR;  Service: Neurosurgery;  Laterality: Left;   CYSTOSCOPY/RETROGRADE/URETEROSCOPY  03/26/2012   Procedure: CYSTOSCOPY/RETROGRADE/URETEROSCOPY;  Surgeon: Garnett Farm, MD;  Location: [facility];  Service: Urology;  Laterality: Bilateral;  CYSTOSCOPY BILATERAL RETROGRADE PYELOGRAM AND POSSIBLE URETEROSCOPY   HERNIA REPAIR  2021   Umbilical Repair   INTRAOPERATIVE TRANSTHORACIC ECHOCARDIOGRAM N/A 01/19/2021   Procedure: INTRAOPERATIVE TRANSTHORACIC ECHOCARDIOGRAM;  Surgeon: Kathleene Hazel, MD;  Location: [facility] OR;  Service: Open Heart Surgery;  Laterality: N/A;   IR ANGIO EXTERNAL CAROTID SEL EXT CAROTID UNI L MOD SED  09/28/2021   IR ANGIO INTRA EXTRACRAN SEL INTERNAL CAROTID UNI L MOD SED  09/28/2021   IR ANGIOGRAM FOLLOW UP STUDY  09/28/2021   IR ANGIOGRAM FOLLOW UP STUDY  09/30/2021   IR NEURO EACH ADD'L AFTER BASIC UNI LEFT (MS)  09/28/2021   IR NEURO EACH ADD'L AFTER BASIC UNI RIGHT (MS)  09/30/2021   IR TRANSCATH/EMBOLIZ  09/28/2021   PLACEMENT OF LUMBAR DRAIN N/A 06/17/2022   Procedure: PLACEMENT OF LUMBAR DRAIN;  Surgeon: Coletta Memos, MD;  Location: [facility] OR;  Service: Neurosurgery;  Laterality: N/A;   RADIOLOGY WITH ANESTHESIA N/A 09/28/2021   Procedure: Middle meningeal artery embolization;  Surgeon: Lisbeth Renshaw, MD;  Location[facility] OR;  Service: Radiology;  Laterality: N/A;   RIGHT HEART CATH AND CORONARY ANGIOGRAPHY N/A 12/04/2020   Procedure: RIGHT HEART CATH AND CORONARY ANGIOGRAPHY;  Surgeon: Kathleene Hazel, MD;  Location: [facility] INVASIVE CV LAB;  Service: Cardiovascular;  Laterality: N/A;   TRANSCATHETER AORTIC VALVE REPLACEMENT, TRANSFEMORAL N/A 01/19/2021   Procedure: TRANSCATHETER AORTIC VALVE REPLACEMENT, TRANSFEMORAL USING A EDWARDS VALVE.;  Surgeon: Kathleene Hazel, MD;  Location: [facility] OR;  Service: Open Heart Surgery;  Laterality: N/A;    Current Outpatient Medications  Medication Sig  Dispense Refill   allopurinol (ZYLOPRIM) 300 MG tablet TAKE 1 TABLET BY MOUTH EVERY DAY 90 tablet 2   amoxicillin (AMOXIL) 500 MG tablet Take FOUR tablets (2000 mg) by mouth ONE HOUR BEFORE ANY DENTAL APPOINTMENT (including cleanings) 12 tablet 3   aspirin EC 81 MG tablet Take 1 tablet (81 mg total) by mouth daily. Swallow whole.     atorvastatin (LIPITOR) 40 MG tablet TAKE 1 TABLET BY MOUTH EVERY DAY 90 tablet 2   AURYXIA 1 GM 210 MG(Fe) tablet Take 630 mg by mouth 3 (three) times daily with meals.     calcitRIOL (ROCALTROL) 0.25 MCG capsule Take 0.25 mcg by mouth. 3x a week per Dr. Marisue Humble     cinacalcet (SENSIPAR) 60 MG tablet Take 60 mg by mouth daily.     gabapentin (NEURONTIN) 100 MG capsule Take 100 mg by mouth at bedtime.     gentamicin cream (GARAMYCIN) 0.1 % Apply 1 Application topically daily.     loratadine (CLARITIN) 10 MG tablet Take 10 mg  by mouth daily.     Methoxy PEG-Epoetin Beta (MIRCERA IJ) Inject into the skin as needed.     NON FORMULARY Pt uses a cpap nightly     Tenapanor HCl, CKD, (XPHOZAH) 30 MG TABS Take 30 mg by mouth in the morning and at bedtime.     No current facility-administered medications for this visit.    No Known Allergies  Social History   Socioeconomic History   Marital status: Married    Spouse name: Not on file   Number of children: 4   Years of education: Not on file   Highest education level: Not on file  Occupational History   Occupation: Research scientist (physical sciences): [employer]  Tobacco Use   Smoking status: Never   Smokeless tobacco: Never  Vaping Use   Vaping status: Never Used  Substance and Sexual Activity   Alcohol use: No   Drug use: No   Sexual activity: Yes  Other Topics Concern   Not on file  Social History Narrative   Family: Married, 4 children, 10 grandkids in 2025         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, manufacturing systems- looking for work   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 : No  Physical Activity: Not on file  Stress: Not on file  Social Connections: Not on file  Intimate Partner Violence: Not on file    Family History  Problem Relation Age of Onset   Hypertension Father    CAD Father        CABG 63s   Kidney disease Father        Stage IV   Valvular heart disease Mother        AVR July 2015   Diabetes Paternal Grandmother    Colon cancer Neg Hx     Review of Systems:  As stated in the HPI and otherwise negative.   BP (!) 86/58   Pulse 96   Ht 5\' 8"  (1.727 m)   Wt 99.2 kg   SpO2 97%   BMI 33.24 kg/m   Physical Examination: General: Well developed, well nourished, NAD  HEENT: OP clear, mucus membranes moist  SKIN: warm, dry. No rashes. Neuro: No focal deficits  Musculoskeletal: Muscle strength 5/5 all ext  Psychiatric: Mood and affect normal  Neck: No JVD, no carotid bruits, no thyromegaly, no lymphadenopathy.  Lungs:Clear bilaterally, no wheezes, rhonci, crackles Cardiovascular: Regular rate and rhythm. No murmurs, gallops or rubs. Abdomen:Soft. Bowel sounds present. Non-tender.  Extremities: No lower extremity edema. Pulses are 2 + in the bilateral DP/PT.  EKG:  EKG is ordered today. The ekg ordered today demonstrates  EKG Interpretation Date/Time:  Monday June 05 2023 08:53:31 EDT Ventricular Rate:  90  PR Interval:    QRS  Duration:  78 QT Interval:  366 QTC Calculation: 447 R Axis:   19  Text Interpretation: Atrial fibrillation Nonspecific ST abnormality Confirmed by Verne Carrow [phone]) on 06/05/2023 8:58:09 AM   Recent Labs: 04/21/2023: Hemoglobin 12.6; Platelets 114 04/28/2023: ALT 16; BUN 73; Creatinine, Ser 16.14; Potassium 4.1; Sodium 142   Lipid Panel    Component Value Date/Time   CHOL 95 (L) 08/28/2020 0737   TRIG 213 (H) 10/06/2021 0600   HDL 30 (L) 08/28/2020 0737   CHOLHDL 3.2 08/28/2020 0737   CHOLHDL 3 10/24/2018 1014   VLDL 37.4 10/24/2018 1014   LDLCALC 39 08/28/2020 0737   LDLDIRECT 70.9 02/10/2012 0737   Wt Readings from Last 3 Encounters:  06/05/23 99.2 kg  05/08/23 99.8 kg  04/28/23 100.9 kg    Assessment and Plan:   1.Severe AS s/p TAVR: He is doing well. AVR working well by echo in November 2023. Will restart ASA 81 mg daily since he is now off of Eliquis. SBE prophylaxis as needed.   2. HTN: BP is well controlled. No changes  3. HLD: No recent lipid profile. Will check lipids and LFTs today  4. Atrial fibrillation, persistent: Rate controlled. He is no longer on Eliquis following a subdural bleed.  Will start ASA. Consider Watchman referral.   5. CAD without angina: He does not have chest pain suggestive of angina. Stress myoview arranged by the kidney transplant team at [facility] with possible anterior wall ischemia. Will arrange a cardiac cath at [facility] on 06[facility]09/23 at [facility].  I have reviewed the risks, indications, and alternatives to cardiac catheterization, possible angioplasty, and stenting with the patient. Risks include but are not limited to bleeding, infection, vascular injury, stroke, myocardial infection, arrhythmia, kidney injury, radiation-related injury in the case of prolonged fluoroscopy use, emergency cardiac surgery, and death. The patient understands the risks of serious complication is 1-2 in 1000 with diagnostic cardiac cath and 1-2% or less  with angioplasty/stenting. Right femoral artery access for cath Start ASA 81 mg daily. He will need to be started on Plavix if we place a stent. He understands this and the risk of bleeding.  Continue statin His beta blocker has been stopped secondary to soft BP  6. Carotid artery disease: He is known to have mild bilateral carotid disease  Labs/ tests ordered today include:   Orders Placed This Encounter  Procedures   Basic metabolic panel with GFR   CBC   Lipid panel   Hepatic function panel   EKG 12-Lead   Disposition:   F/U with me one year.   Signed, Verne Carrow, MD 06/05/2023 9:37 AM    [facility] [address] [place], [place], [place]  [address] Phone: [phone]; Fax: [phone]

## 2023-06-05 NOTE — Progress Notes (Signed)
 Chief Complaint  Patient presents with   Follow-up    CAD, abnormal stress test    History of Present Illness: 63 yo male with history of ESRD on PD, CAD, HTN, HLD, sinus bradycardia, bicuspid aortic valve with severe AS s/p TAVR,  sleep apnea, thoracic aortic aneurysm and persistent atrial fibrillation here today for cardiac follow up. He was admitted with unstable angina in September 2012 and was found to have severe stenosis in the mid to distal LAD treated with a 2.25 x 18 mm Resolute DES. He was also found to have moderate mid LAD and distal RCA disease. In December 2017 he was found to have a branch retinal artery occlusion in his right eye. He was in atrial fibrillation at time of office visit here in January 2018. He was started on Xarelto January 2018. He was followed for bicuspid aortic stenosis. This progressed to severe AS and he underwent TAVR in November 2022. Cardiac cath October 2022 with patent LAD stent, moderate diffuse mid LAD stenosis, moderate distal stenosis. Echo November 2023 with LVEF=60%, normally functioning TAVR valve with mean gradient of 10 mmHg, no PVL, mild MR. He has been off of his Eliquis after a spontaneous subdural hematoma last year.   He is trying to get onto the active kidney transplant list. Nuclear stress test at [facility] in December 2024 with possible anterior wall ischemia. This was arranged pre-transplant. I was not aware of this test until it was sent over last week by the transplant team.   He is here today for follow up. The patient denies any chest pain, dyspnea, palpitations, lower extremity edema, orthopnea, PND, dizziness, near syncope or syncope.   Primary Care Physician: Shelva Majestic, MD  Past Medical History:  Diagnosis Date   Anemia    Anemia in chronic kidney disease 03/20/2020   Bicuspid aortic valve    Blood transfusion without reported diagnosis    CAD (coronary artery disease)    proximal LAD 50%, mid LAD 50-60%,  mid to distal LAD 99%; mid circumflex 20%; proximal RCA 20%, distal RCA 50%; PDA 60-70%, proximal posterior AV groove 50%, proximal posterior lateral 50%.  There was a question of possible LVOT gradient;  s/p Resolute DES to mid-distal LAD 10/2010;  echocardiogram 11/17/10: EF 55-60%, mild LVH, grade 1 diastolic dysfunction, normal aortic valve, mild MR, PASP 32.    Chicken pox    Dysrhythmia    A. Fib   Enlarged aorta ([name])    ESRD on peritoneal dialysis ([name])    Glaucoma    Gout    allopurinol 300mg , colchicine prn in past, has not had flares   Headache    History of kidney stones    HTN (hypertension) 12/06/2010   Lisinopril 20mg ,  amlodipine 5mg . Home cuff runs high- likely 20/10 higher at least.      HCTZ- stopped by Dr. Hyman Hopes     Hypercholesterolemia    With hypertriglyceridemia   Hypertension    Mild aortic stenosis    PAF (paroxysmal atrial fibrillation) ([name])    a. Documented on event monitor 01/2016.   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   SDH (subdural hematoma) ([name]) 08/2021   left   Sleep apnea    cpap nightly    Past Surgical History:  Procedure Laterality Date   COLONOSCOPY  2018   CORONARY STENT PLACEMENT  02/28/2010   CRANIOPLASTY N/A 02/25/2022   Procedure: Cranioplasty with custom plate;  Surgeon: Coletta Memos, MD;  Location[facility] OR;  Service: Neurosurgery;  Laterality: N/A;   CRANIOPLASTY N/A 06/17/2022   Procedure: REVISION OF CRANIOPLASTY/PLACEMENT OF LUMBAR DRAIN;  Surgeon: Coletta Memos, MD;  Location: [facility] OR;  Service: Neurosurgery;  Laterality: N/A;   CRANIOTOMY Left 10/05/2021   Procedure: CRANIOTOMY HEMATOMA EVACUATION SUBDURAL;  Surgeon: Coletta Memos, MD;  Location: [facility] OR;  Service: Neurosurgery;  Laterality: Left;   CRANIOTOMY Left 10/07/2021   Procedure: Craniectomy for Recurrent  Intracranial Hematoma;   Surgeon: Coletta Memos, MD;  Location: [facility] OR;  Service: Neurosurgery;  Laterality: Left;   CYSTOSCOPY/RETROGRADE/URETEROSCOPY  03/26/2012   Procedure: CYSTOSCOPY/RETROGRADE/URETEROSCOPY;  Surgeon: Garnett Farm, MD;  Location: [facility];  Service: Urology;  Laterality: Bilateral;  CYSTOSCOPY BILATERAL RETROGRADE PYELOGRAM AND POSSIBLE URETEROSCOPY   HERNIA REPAIR  2021   Umbilical Repair   INTRAOPERATIVE TRANSTHORACIC ECHOCARDIOGRAM N/A 01/19/2021   Procedure: INTRAOPERATIVE TRANSTHORACIC ECHOCARDIOGRAM;  Surgeon: Kathleene Hazel, MD;  Location: [facility] OR;  Service: Open Heart Surgery;  Laterality: N/A;   IR ANGIO EXTERNAL CAROTID SEL EXT CAROTID UNI L MOD SED  09/28/2021   IR ANGIO INTRA EXTRACRAN SEL INTERNAL CAROTID UNI L MOD SED  09/28/2021   IR ANGIOGRAM FOLLOW UP STUDY  09/28/2021   IR ANGIOGRAM FOLLOW UP STUDY  09/30/2021   IR NEURO EACH ADD'L AFTER BASIC UNI LEFT (MS)  09/28/2021   IR NEURO EACH ADD'L AFTER BASIC UNI RIGHT (MS)  09/30/2021   IR TRANSCATH/EMBOLIZ  09/28/2021   PLACEMENT OF LUMBAR DRAIN N/A 06/17/2022   Procedure: PLACEMENT OF LUMBAR DRAIN;  Surgeon: Coletta Memos, MD;  Location: [facility] OR;  Service: Neurosurgery;  Laterality: N/A;   RADIOLOGY WITH ANESTHESIA N/A 09/28/2021   Procedure: Middle meningeal artery embolization;  Surgeon: Lisbeth Renshaw, MD;  Location[facility] OR;  Service: Radiology;  Laterality: N/A;   RIGHT HEART CATH AND CORONARY ANGIOGRAPHY N/A 12/04/2020   Procedure: RIGHT HEART CATH AND CORONARY ANGIOGRAPHY;  Surgeon: Kathleene Hazel, MD;  Location: [facility] INVASIVE CV LAB;  Service: Cardiovascular;  Laterality: N/A;   TRANSCATHETER AORTIC VALVE REPLACEMENT, TRANSFEMORAL N/A 01/19/2021   Procedure: TRANSCATHETER AORTIC VALVE REPLACEMENT, TRANSFEMORAL USING A EDWARDS VALVE.;  Surgeon: Kathleene Hazel, MD;  Location: [facility] OR;  Service: Open Heart Surgery;  Laterality: N/A;    Current Outpatient Medications  Medication Sig  Dispense Refill   allopurinol (ZYLOPRIM) 300 MG tablet TAKE 1 TABLET BY MOUTH EVERY DAY 90 tablet 2   amoxicillin (AMOXIL) 500 MG tablet Take FOUR tablets (2000 mg) by mouth ONE HOUR BEFORE ANY DENTAL APPOINTMENT (including cleanings) 12 tablet 3   aspirin EC 81 MG tablet Take 1 tablet (81 mg total) by mouth daily. Swallow whole.     atorvastatin (LIPITOR) 40 MG tablet TAKE 1 TABLET BY MOUTH EVERY DAY 90 tablet 2   AURYXIA 1 GM 210 MG(Fe) tablet Take 630 mg by mouth 3 (three) times daily with meals.     calcitRIOL (ROCALTROL) 0.25 MCG capsule Take 0.25 mcg by mouth. 3x a week per Dr. Marisue Humble     cinacalcet (SENSIPAR) 60 MG tablet Take 60 mg by mouth daily.     gabapentin (NEURONTIN) 100 MG capsule Take 100 mg by mouth at bedtime.     gentamicin cream (GARAMYCIN) 0.1 % Apply 1 Application topically daily.     loratadine (CLARITIN) 10 MG tablet Take 10 mg  by mouth daily.     Methoxy PEG-Epoetin Beta (MIRCERA IJ) Inject into the skin as needed.     NON FORMULARY Pt uses a cpap nightly     Tenapanor HCl, CKD, (XPHOZAH) 30 MG TABS Take 30 mg by mouth in the morning and at bedtime.     No current facility-administered medications for this visit.    No Known Allergies  Social History   Socioeconomic History   Marital status: Married    Spouse name: Not on file   Number of children: 4   Years of education: Not on file   Highest education level: Not on file  Occupational History   Occupation: Research scientist (physical sciences): [employer]  Tobacco Use   Smoking status: Never   Smokeless tobacco: Never  Vaping Use   Vaping status: Never Used  Substance and Sexual Activity   Alcohol use: No   Drug use: No   Sexual activity: Yes  Other Topics Concern   Not on file  Social History Narrative   Family: Married, 4 children, 10 grandkids in 2025         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, manufacturing systems- looking for work   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 : No  Physical Activity: Not on file  Stress: Not on file  Social Connections: Not on file  Intimate Partner Violence: Not on file    Family History  Problem Relation Age of Onset   Hypertension Father    CAD Father        CABG 63s   Kidney disease Father        Stage IV   Valvular heart disease Mother        AVR July 2015   Diabetes Paternal Grandmother    Colon cancer Neg Hx     Review of Systems:  As stated in the HPI and otherwise negative.   BP (!) 86/58   Pulse 96   Ht 5\' 8"  (1.727 m)   Wt 99.2 kg   SpO2 97%   BMI 33.24 kg/m   Physical Examination: General: Well developed, well nourished, NAD  HEENT: OP clear, mucus membranes moist  SKIN: warm, dry. No rashes. Neuro: No focal deficits  Musculoskeletal: Muscle strength 5/5 all ext  Psychiatric: Mood and affect normal  Neck: No JVD, no carotid bruits, no thyromegaly, no lymphadenopathy.  Lungs:Clear bilaterally, no wheezes, rhonci, crackles Cardiovascular: Regular rate and rhythm. No murmurs, gallops or rubs. Abdomen:Soft. Bowel sounds present. Non-tender.  Extremities: No lower extremity edema. Pulses are 2 + in the bilateral DP/PT.  EKG:  EKG is ordered today. The ekg ordered today demonstrates  EKG Interpretation Date/Time:  Monday June 05 2023 08:53:31 EDT Ventricular Rate:  90  PR Interval:    QRS  Duration:  78 QT Interval:  366 QTC Calculation: 447 R Axis:   19  Text Interpretation: Atrial fibrillation Nonspecific ST abnormality Confirmed by Verne Carrow [phone]) on 06/05/2023 8:58:09 AM   Recent Labs: 04/21/2023: Hemoglobin 12.6; Platelets 114 04/28/2023: ALT 16; BUN 73; Creatinine, Ser 16.14; Potassium 4.1; Sodium 142   Lipid Panel    Component Value Date/Time   CHOL 95 (L) 08/28/2020 0737   TRIG 213 (H) 10/06/2021 0600   HDL 30 (L) 08/28/2020 0737   CHOLHDL 3.2 08/28/2020 0737   CHOLHDL 3 10/24/2018 1014   VLDL 37.4 10/24/2018 1014   LDLCALC 39 08/28/2020 0737   LDLDIRECT 70.9 02/10/2012 0737   Wt Readings from Last 3 Encounters:  06/05/23 99.2 kg  05/08/23 99.8 kg  04/28/23 100.9 kg    Assessment and Plan:   1.Severe AS s/p TAVR: He is doing well. AVR working well by echo in November 2023. Will restart ASA 81 mg daily since he is now off of Eliquis. SBE prophylaxis as needed.   2. HTN: BP is well controlled. No changes  3. HLD: No recent lipid profile. Will check lipids and LFTs today  4. Atrial fibrillation, persistent: Rate controlled. He is no longer on Eliquis following a subdural bleed.  Will start ASA. Consider Watchman referral.   5. CAD without angina: He does not have chest pain suggestive of angina. Stress myoview arranged by the kidney transplant team at [facility] with possible anterior wall ischemia. Will arrange a cardiac cath at [facility] on 06[facility]09/23 at [facility].  I have reviewed the risks, indications, and alternatives to cardiac catheterization, possible angioplasty, and stenting with the patient. Risks include but are not limited to bleeding, infection, vascular injury, stroke, myocardial infection, arrhythmia, kidney injury, radiation-related injury in the case of prolonged fluoroscopy use, emergency cardiac surgery, and death. The patient understands the risks of serious complication is 1-2 in 1000 with diagnostic cardiac cath and 1-2% or less  with angioplasty/stenting. Right femoral artery access for cath Start ASA 81 mg daily. He will need to be started on Plavix if we place a stent. He understands this and the risk of bleeding.  Continue statin His beta blocker has been stopped secondary to soft BP  6. Carotid artery disease: He is known to have mild bilateral carotid disease  Labs/ tests ordered today include:   Orders Placed This Encounter  Procedures   Basic metabolic panel with GFR   CBC   Lipid panel   Hepatic function panel   EKG 12-Lead   Disposition:   F/U with me one year.   Signed, Verne Carrow, MD 06/05/2023 9:37 AM    [facility] [address] [place], [place], [place]  [address] Phone: [phone]; Fax: [phone]

## 2023-06-06 LAB — CBC
Hematocrit: 36.2 % — ABNORMAL LOW (ref 37.5–51.0)
Hemoglobin: 12.1 g/dL — ABNORMAL LOW (ref 13.0–17.7)
MCH: 34.9 pg — ABNORMAL HIGH (ref 26.6–33.0)
MCHC: 33.4 g/dL (ref 31.5–35.7)
MCV: 104 fL — ABNORMAL HIGH (ref 79–97)
Platelets: 184 10*3/uL (ref 150–450)
RBC: 3.47 x10E6/uL — ABNORMAL LOW (ref 4.14–5.80)
RDW: 13.5 % (ref 11.6–15.4)
WBC: 7.2 10*3/uL (ref 3.4–10.8)

## 2023-06-06 LAB — HEPATIC FUNCTION PANEL
ALT: 10 IU/L (ref 0–44)
AST: 10 IU/L (ref 0–40)
Albumin: 4.2 g/dL (ref 3.9–4.9)
Alkaline Phosphatase: 166 IU/L — ABNORMAL HIGH (ref 44–121)
Bilirubin Total: 0.5 mg/dL (ref 0.0–1.2)
Bilirubin, Direct: 0.1 mg/dL (ref 0.00–0.40)
Total Protein: 6.9 g/dL (ref 6.0–8.5)

## 2023-06-06 LAB — BASIC METABOLIC PANEL WITH GFR
BUN/Creatinine Ratio: 3 — ABNORMAL LOW (ref 10–24)
BUN: 46 mg/dL — ABNORMAL HIGH (ref 8–27)
CO2: 23 mmol/L (ref 20–29)
Calcium: 8.7 mg/dL (ref 8.6–10.2)
Chloride: 95 mmol/L — ABNORMAL LOW (ref 96–106)
Creatinine, Ser: 15.11 mg/dL (ref 0.76–1.27)
Glucose: 110 mg/dL — ABNORMAL HIGH (ref 70–99)
Potassium: 4.1 mmol/L (ref 3.5–5.2)
Sodium: 144 mmol/L (ref 134–144)
eGFR: 3 mL/min/{1.73_m2} — ABNORMAL LOW (ref >59)

## 2023-06-06 LAB — LIPID PANEL
Chol/HDL Ratio: 5.8 ratio — ABNORMAL HIGH (ref 0.0–5.0)
Cholesterol, Total: 151 mg/dL (ref 100–199)
HDL: 26 mg/dL — ABNORMAL LOW (ref >39)
LDL Chol Calc (NIH): 85 mg/dL (ref 0–99)
Triglycerides: 239 mg/dL — ABNORMAL HIGH (ref 0–149)
VLDL Cholesterol Cal: 40 mg/dL (ref 5–40)

## 2023-06-08 ENCOUNTER — Telehealth: Payer: Self-pay | Admitting: *Deleted

## 2023-06-08 NOTE — Telephone Encounter (Signed)
 Cardiac Catheterization scheduled at [facility] for: Friday June 09, 2023 10:30 AM Arrival time [facility] Main Entrance A at: 8:30 AM  Nothing to eat after midnight prior to procedure, clear liquids until 5 AM day of procedure.  Medication instructions: -Usual morning medications can be taken with sips of water including aspirin 81 mg.  Plan to go home the same day, you will only stay overnight if medically necessary.  You must have responsible adult to drive you home.  Someone must be with you the first 24 hours after you arrive home.  Reviewed procedure instructions with patient. Patient tells me he does peritoneal dialysis nightly and will manage his schedule for cath 06/09/23.

## 2023-06-09 ENCOUNTER — Ambulatory Visit (HOSPITAL_COMMUNITY)
Admission: RE | Admit: 2023-06-09 | Discharge: 2023-06-09 | Disposition: A | Discharge status: Home / Self Care | Attending: Cardiovascular Disease | Admitting: Cardiovascular Disease

## 2023-06-09 ENCOUNTER — Other Ambulatory Visit: Payer: Self-pay

## 2023-06-09 ENCOUNTER — Encounter (HOSPITAL_COMMUNITY): Payer: Self-pay | Admitting: Cardiovascular Disease

## 2023-06-09 ENCOUNTER — Encounter (HOSPITAL_COMMUNITY)
Admission: RE | Disposition: A | Discharge status: Home / Self Care | Payer: Self-pay | Source: Home / Self Care | Attending: Cardiovascular Disease

## 2023-06-09 DIAGNOSIS — I2510 Atherosclerotic heart disease of native coronary artery without angina pectoris: Secondary | ICD-10-CM | POA: Insufficient documentation

## 2023-06-09 DIAGNOSIS — R9439 Abnormal result of other cardiovascular function study: Secondary | ICD-10-CM | POA: Diagnosis present

## 2023-06-09 DIAGNOSIS — Z7901 Long term (current) use of anticoagulants: Secondary | ICD-10-CM | POA: Insufficient documentation

## 2023-06-09 DIAGNOSIS — N186 End stage renal disease: Secondary | ICD-10-CM | POA: Insufficient documentation

## 2023-06-09 DIAGNOSIS — I4819 Other persistent atrial fibrillation: Secondary | ICD-10-CM | POA: Diagnosis not present

## 2023-06-09 DIAGNOSIS — Z7982 Long term (current) use of aspirin: Secondary | ICD-10-CM | POA: Insufficient documentation

## 2023-06-09 DIAGNOSIS — Z992 Dependence on renal dialysis: Secondary | ICD-10-CM | POA: Diagnosis not present

## 2023-06-09 DIAGNOSIS — Z952 Presence of prosthetic heart valve: Secondary | ICD-10-CM | POA: Insufficient documentation

## 2023-06-09 DIAGNOSIS — I120 Hypertensive chronic kidney disease with stage 5 chronic kidney disease or end stage renal disease: Secondary | ICD-10-CM | POA: Diagnosis not present

## 2023-06-09 DIAGNOSIS — G4730 Sleep apnea, unspecified: Secondary | ICD-10-CM | POA: Diagnosis not present

## 2023-06-09 DIAGNOSIS — I779 Disorder of arteries and arterioles, unspecified: Secondary | ICD-10-CM | POA: Diagnosis not present

## 2023-06-09 DIAGNOSIS — E785 Hyperlipidemia, unspecified: Secondary | ICD-10-CM | POA: Insufficient documentation

## 2023-06-09 DIAGNOSIS — Z79899 Other long term (current) drug therapy: Secondary | ICD-10-CM | POA: Diagnosis not present

## 2023-06-09 HISTORY — PX: LEFT HEART CATH AND CORONARY ANGIOGRAPHY: CATH118249

## 2023-06-09 SURGERY — LEFT HEART CATH AND CORONARY ANGIOGRAPHY
Anesthesia: LOCAL

## 2023-06-09 MED ORDER — SODIUM CHLORIDE 0.9% FLUSH: 3.0000 mL | Freq: Two times a day (BID) | INTRAVENOUS | Status: DC

## 2023-06-09 MED ORDER — SODIUM CHLORIDE 0.9 % IV SOLN: INTRAVENOUS | Status: DC

## 2023-06-09 MED ORDER — LIDOCAINE HCL (PF) 1 % IJ SOLN
INTRAMUSCULAR | Status: DC | PRN
  Administered 2023-06-09: 15 mL via INTRADERMAL

## 2023-06-09 MED ORDER — SODIUM CHLORIDE 0.9 % IV SOLN: 250.0000 mL | INTRAVENOUS | Status: DC | PRN

## 2023-06-09 MED ORDER — FENTANYL CITRATE (PF) 100 MCG/2ML IJ SOLN
INTRAMUSCULAR | Status: AC
  Filled 2023-06-09: qty 2

## 2023-06-09 MED ORDER — ASPIRIN 81 MG PO CHEW
81.0000 mg | CHEWABLE_TABLET | ORAL | Status: DC
Start: 2023-06-09 — End: 2023-06-09

## 2023-06-09 MED ORDER — HYDRALAZINE HCL 20 MG/ML IJ SOLN: 10.0000 mg | INTRAMUSCULAR | Status: DC | PRN

## 2023-06-09 MED ORDER — ONDANSETRON HCL 4 MG/2ML IJ SOLN
4.0000 mg | Freq: Four times a day (QID) | INTRAMUSCULAR | Status: DC | PRN
Start: 2023-06-09 — End: 2023-06-09

## 2023-06-09 MED ORDER — MIDAZOLAM HCL 2 MG/2ML IJ SOLN
INTRAMUSCULAR | Status: DC | PRN
  Administered 2023-06-09: 1 mg via INTRAVENOUS

## 2023-06-09 MED ORDER — SODIUM CHLORIDE 0.9% FLUSH: 3.0000 mL | INTRAVENOUS | Status: DC | PRN

## 2023-06-09 MED ORDER — MIDAZOLAM HCL 2 MG/2ML IJ SOLN
INTRAMUSCULAR | Status: AC
  Filled 2023-06-09: qty 2

## 2023-06-09 MED ORDER — HEPARIN (PORCINE) IN NACL 1000-0.9 UT/500ML-% IV SOLN
INTRAVENOUS | Status: DC | PRN
  Administered 2023-06-09 (×2): 500 mL

## 2023-06-09 MED ORDER — LABETALOL HCL 5 MG/ML IV SOLN: 10.0000 mg | INTRAVENOUS | Status: DC | PRN

## 2023-06-09 MED ORDER — FENTANYL CITRATE (PF) 100 MCG/2ML IJ SOLN
INTRAMUSCULAR | Status: DC | PRN
  Administered 2023-06-09: 25 ug via INTRAVENOUS

## 2023-06-09 MED ORDER — IOHEXOL 350 MG/ML SOLN
INTRAVENOUS | Status: DC | PRN
  Administered 2023-06-09: 30 mL

## 2023-06-09 MED ORDER — LIDOCAINE HCL (PF) 1 % IJ SOLN
INTRAMUSCULAR | Status: AC
  Filled 2023-06-09: qty 30

## 2023-06-09 MED ORDER — ACETAMINOPHEN 325 MG PO TABS: 650.0000 mg | ORAL_TABLET | ORAL | Status: DC | PRN

## 2023-06-09 SURGICAL SUPPLY — 8 items
CATH INFINITI 5FR MULTPACK ANG (CATHETERS) IMPLANT
CLOSURE MYNX CONTROL 5F (Vascular Products) IMPLANT
KIT MICROPUNCTURE NIT STIFF (SHEATH) IMPLANT
PACK CARDIAC CATHETERIZATION (CUSTOM PROCEDURE TRAY) ×1 IMPLANT
SET ATX-X65L (MISCELLANEOUS) IMPLANT
SHEATH PINNACLE 5F 10CM (SHEATH) IMPLANT
SHEATH PROBE COVER 6X72 (BAG) IMPLANT
WIRE EMERALD 3MM-J .035X150CM (WIRE) IMPLANT

## 2023-06-09 NOTE — Interval H&P Note (Signed)
 History and Physical Interval Note:  06/09/2023 10:01 AM  Daniel Solomon  has presented today for surgery, with the diagnosis of cad - abnormal nuc.  The various methods of treatment have been discussed with the patient and family. After consideration of risks, benefits and other options for treatment, the patient has consented to  Procedure(s): LEFT HEART CATH AND CORONARY ANGIOGRAPHY (N/A) as a surgical intervention.  The patient's history has been reviewed, patient examined, no change in status, stable for surgery.  I have reviewed the patient's chart and labs.  Questions were answered to the patient's satisfaction.    Cath Lab Visit (complete for each Cath Lab visit)  Clinical Evaluation Leading to the Procedure:   ACS: No.  Non-ACS:    Anginal Classification: No Symptoms  Anti-ischemic medical therapy: No Therapy  Non-Invasive Test Results: High-risk stress test findings: cardiac mortality >3%/year  Prior CABG: No previous CABG   Verne Carrow

## 2023-06-09 NOTE — Discharge Instructions (Signed)
 Femoral Site Care This sheet gives you information about how to care for yourself after your procedure. Your health care provider may also give you more specific instructions. If you have problems or questions, contact your health care provider. What can I expect after the procedure?  After the procedure, it is common to have: Bruising that usually fades within 1-2 weeks. Tenderness at the site. Follow these instructions at home: Wound care Follow instructions from your health care provider about how to take care of your insertion site. Make sure you: Wash your hands with soap and water before you change your bandage (dressing). If soap and water are not available, use hand sanitizer. Remove your dressing as told by your health care provider. In 24 hours Do not take baths, swim, or use a hot tub until your health care provider approves. You may shower 24-48 hours after the procedure or as told by your health care provider. Gently wash the site with plain soap and water. Pat the area dry with a clean towel. Do not rub the site. This may cause bleeding. Do not apply powder or lotion to the site. Keep the site clean and dry. Check your femoral site every day for signs of infection. Check for: Redness, swelling, or pain. Fluid or blood. Warmth. Pus or a bad smell. Activity For the first 2-3 days after your procedure, or as long as directed: Avoid climbing stairs as much as possible. Do not squat. Do not lift anything that is heavier than 10 lb (4.5 kg), or the limit that you are told, until your health care provider says that it is safe. For 5 days Rest as directed. Avoid sitting for a long time without moving. Get up to take short walks every 1-2 hours. Do not drive for 24 hours if you were given a medicine to help you relax (sedative). General instructions Take over-the-counter and prescription medicines only as told by your health care provider. Keep all follow-up visits as told by  your health care provider. This is important. Contact a health care provider if you have: A fever or chills. You have redness, swelling, or pain around your insertion site. Get help right away if: The catheter insertion area swells very fast. You pass out. You suddenly start to sweat or your skin gets clammy. The catheter insertion area is bleeding, and the bleeding does not stop when you hold steady pressure on the area. The area near or just beyond the catheter insertion site becomes pale, cool, tingly, or numb. These symptoms may represent a serious problem that is an emergency. Do not wait to see if the symptoms will go away. Get medical help right away. Call your local emergency services (911 in the [place] Do not drive yourself to the hospital. Summary After the procedure, it is common to have bruising that usually fades within 1-2 weeks. Check your femoral site every day for signs of infection. Do not lift anything that is heavier than 10 lb (4.5 kg), or the limit that you are told, until your health care provider says that it is safe. This information is not intended to replace advice given to you by your health care provider. Make sure you discuss any questions you have with your health care provider. Document Revised: 02/27/2017 Document Reviewed: 02/27/2017 Elsevier Patient Education  2020 [employer].

## 2023-06-09 NOTE — Progress Notes (Signed)
 Patient walked to the bathroom without difficulties. Right groin level 0, clean, dry, and intact.

## 2023-06-20 ENCOUNTER — Telehealth: Payer: Self-pay | Admitting: *Deleted

## 2023-06-20 DIAGNOSIS — Z01818 Encounter for other preprocedural examination: Secondary | ICD-10-CM

## 2023-06-20 DIAGNOSIS — R9439 Abnormal result of other cardiovascular function study: Secondary | ICD-10-CM

## 2023-06-20 DIAGNOSIS — I2510 Atherosclerotic heart disease of native coronary artery without angina pectoris: Secondary | ICD-10-CM

## 2023-06-20 NOTE — Telephone Encounter (Signed)
-----   Message from Antoinette Batman sent at 06/19/2023  2:34 PM EDT ----- Genette Kent. I spoke to the transplant nephrologist and she wants an exercise stress test on Mr. Conry. Can you help me set this up. She is hoping he passes that easily and she can then list him for a transplant. Thanks, chris

## 2023-06-21 NOTE — Telephone Encounter (Signed)
 Order placed for GXT. Attestation order placed. Instructions sent to patient via MyChart  Called patient to make him aware.  He voices understanding.  He has his post cath follow up with Dr. Abel Hoe on Monday 4/29.  He is happy to reschedule this to another day if it is not needed.  He has some other things that same morning and so wouldn't mind if the follow up got moved.

## 2023-06-22 ENCOUNTER — Encounter (HOSPITAL_COMMUNITY): Payer: Self-pay | Admitting: Nephrology

## 2023-06-22 ENCOUNTER — Ambulatory Visit (HOSPITAL_COMMUNITY)
Admission: RE | Admit: 2023-06-22 | Discharge: 2023-06-22 | Disposition: A | Discharge status: Home / Self Care | Attending: Nephrology | Admitting: Nephrology

## 2023-06-22 ENCOUNTER — Encounter (HOSPITAL_COMMUNITY)
Admission: RE | Disposition: A | Discharge status: Home / Self Care | Payer: Self-pay | Source: Home / Self Care | Attending: Nephrology

## 2023-06-22 ENCOUNTER — Other Ambulatory Visit: Payer: Self-pay

## 2023-06-22 DIAGNOSIS — Z952 Presence of prosthetic heart valve: Secondary | ICD-10-CM | POA: Insufficient documentation

## 2023-06-22 DIAGNOSIS — N186 End stage renal disease: Secondary | ICD-10-CM | POA: Diagnosis not present

## 2023-06-22 DIAGNOSIS — D631 Anemia in chronic kidney disease: Secondary | ICD-10-CM | POA: Diagnosis not present

## 2023-06-22 DIAGNOSIS — I120 Hypertensive chronic kidney disease with stage 5 chronic kidney disease or end stage renal disease: Secondary | ICD-10-CM | POA: Diagnosis present

## 2023-06-22 DIAGNOSIS — I4891 Unspecified atrial fibrillation: Secondary | ICD-10-CM | POA: Diagnosis not present

## 2023-06-22 DIAGNOSIS — Z79899 Other long term (current) drug therapy: Secondary | ICD-10-CM | POA: Diagnosis not present

## 2023-06-22 DIAGNOSIS — Z992 Dependence on renal dialysis: Secondary | ICD-10-CM | POA: Diagnosis not present

## 2023-06-22 DIAGNOSIS — G4733 Obstructive sleep apnea (adult) (pediatric): Secondary | ICD-10-CM | POA: Insufficient documentation

## 2023-06-22 DIAGNOSIS — N250 Renal osteodystrophy: Secondary | ICD-10-CM | POA: Diagnosis not present

## 2023-06-22 DIAGNOSIS — I2510 Atherosclerotic heart disease of native coronary artery without angina pectoris: Secondary | ICD-10-CM | POA: Insufficient documentation

## 2023-06-22 HISTORY — PX: DIALYSIS/PERMA CATHETER INSERTION: CATH118288

## 2023-06-22 SURGERY — DIALYSIS/PERMA CATHETER INSERTION
Laterality: Right

## 2023-06-22 MED ORDER — HEPARIN (PORCINE) IN NACL 1000-0.9 UT/500ML-% IV SOLN
INTRAVENOUS | Status: DC | PRN
  Administered 2023-06-22: 500 mL

## 2023-06-22 MED ORDER — FENTANYL CITRATE (PF) 100 MCG/2ML IJ SOLN
INTRAMUSCULAR | Status: AC
  Filled 2023-06-22: qty 2

## 2023-06-22 MED ORDER — MIDAZOLAM HCL 2 MG/2ML IJ SOLN
INTRAMUSCULAR | Status: AC
  Filled 2023-06-22: qty 2

## 2023-06-22 MED ORDER — LIDOCAINE HCL (PF) 1 % IJ SOLN
INTRAMUSCULAR | Status: DC | PRN
  Administered 2023-06-22: 5 mL via SUBCUTANEOUS
  Administered 2023-06-22: 10 mL via SUBCUTANEOUS

## 2023-06-22 MED ORDER — FENTANYL CITRATE (PF) 100 MCG/2ML IJ SOLN
INTRAMUSCULAR | Status: DC | PRN
  Administered 2023-06-22: 25 ug via INTRAVENOUS

## 2023-06-22 MED ORDER — HEPARIN SODIUM (PORCINE) 1000 UNIT/ML IJ SOLN
INTRAMUSCULAR | Status: DC | PRN
  Administered 2023-06-22 (×2): 1600 [IU] via INTRAVENOUS

## 2023-06-22 MED ORDER — LIDOCAINE HCL (PF) 1 % IJ SOLN
INTRAMUSCULAR | Status: AC
  Filled 2023-06-22: qty 30

## 2023-06-22 MED ORDER — HEPARIN SODIUM (PORCINE) 1000 UNIT/ML IJ SOLN
INTRAMUSCULAR | Status: AC
  Filled 2023-06-22: qty 10

## 2023-06-22 MED ORDER — IODIXANOL 320 MG/ML IV SOLN
INTRAVENOUS | Status: DC | PRN
  Administered 2023-06-22: 1 mL via INTRAVENOUS

## 2023-06-22 MED ORDER — MIDAZOLAM HCL 2 MG/2ML IJ SOLN
INTRAMUSCULAR | Status: DC | PRN
  Administered 2023-06-22: .5 mg via INTRAVENOUS

## 2023-06-22 SURGICAL SUPPLY — 9 items
BAG SNAP BAND KOVER 36X36 (MISCELLANEOUS) ×1 IMPLANT
CATH ANGIO 5F BER2 65CM (CATHETERS) ×1 IMPLANT
CATH PALINDROME-P 19CM W/VT (CATHETERS) ×1 IMPLANT
COVER DOME SNAP 22 D (MISCELLANEOUS) ×1 IMPLANT
GUIDEWIRE ZIPWIRE 035/150 ANGL (WIRE) ×1 IMPLANT
SHEATH PINNACLE 7F 10CM (SHEATH) ×1 IMPLANT
SHEATH PROBE COVER 6X72 (BAG) ×1 IMPLANT
TRAY PV CATH (CUSTOM PROCEDURE TRAY) ×1 IMPLANT
WIRE MICRO SET SILHO 5FR 7 (SHEATH) ×1 IMPLANT

## 2023-06-22 NOTE — Telephone Encounter (Signed)
 Per Dr. Abel Hoe move out the appointment for follow up after the GXT.  Cancelled visit 4/29.  Message pt to confirm this with him.

## 2023-06-22 NOTE — Discharge Instructions (Signed)

## 2023-06-22 NOTE — Op Note (Signed)
 Patient presents for tunneled catheter insertion to initiate dialysis he is failing PD; ultrasound shows a good right IJ. The decision was made to place a RIJ tunneled dialysis catheter.  Blood pressure was on the low side and the amount of sedation given was limited.  Summary:  1) Successful placement of a new 19 cm cuff to tip hemodialysis catheter (Palindrome) in the right internal jugular vein with the tip in the right atrium.  2) Recommendations to the dialysis unit TO NOT USE HEPARIN  FOR THE FIRST 24 HOURS.  Description of procedure: The right neck, chest and the catheter were prepped and draped in the usual sterile fashion.  Local anesthesia was provided by injecting lidocaine  1% at the neck site overlying the desired venotomy site. Using real time ultrasound guidance, I was able to cannulate the RIJ and advance the guidewire easily into the central circulation. The wire was then manipulated and advanced into the abdominal IVC. I then blunt dissected the tissue around the 5 Fr stylet until it was freely mobile. Then, after injecting local anesthesia into the desired track and exit site I made a small incision at the exit site and tunneled a 19 cm cuff to tip Palindrome dialysis catheter, pulling it out at the venotomy site. Sequential dilation with 12, 14, and finally the peelaway sheath were done with real time fluoroscopic guidance ensuring that we were straight always lined with the wire.   Then, I inserted the catheter over the wire and through the sheath. After removing the peelaway sheath, I adjusted the catheter until the tip of the catheter was positioned in the right atrium of the heart. The cuff of the catheter was positioned in the subcutaneous tunnel with the cuff approximately 2 cm from the exit site.   Both limbs of the catheter were aspirated and flushed with excellent flow noted. Both limbs of the catheter were locked with heparin  and sterile caps placed. The hub of the catheter was  sutured to the chest wall with 2-0 nylon suture.   The neck incision was closed with a vertical mattress 3-0 plain gut sutures and a purse-string 3-0 plain gut was placed at the tunnel exit site. 2-0 loose nylon sutures secured the hub to the chest wall.                         Sterile dressings were placed, and the patient returned to recovery in stable condition.  Sedation: 0.5 mg Versed , 25 mcg Fentanyl . Sedation time: 24 minutes  Contrast 1 mL  Monitoring: Because of the patient's comorbid conditions and sedation during the procedure, continuous EKG monitoring and O2 saturation monitoring was performed throughout the procedure by the RN. There were no abnormal arrhythmias encountered.  Complications: None.  Diagnoses:   N18.6 End stage renal disease Z99.2 Dependence on renal dialysis  Procedures Coding:  36558 Tunneled catheter insertion [id number] Ultrasound guidance 81191  Fluoroscopy guidance for catheter insertion. Y7829 Contrast  Recommendations: Remove the suture in 3 weeks. 2.   Report any blood flow problems to [facility].  Discharge: The patient was discharged home in stable condition. The patient was given education regarding the care of the catheter and specific instructions in case of any problems.

## 2023-06-22 NOTE — H&P (Addendum)
 Chief Complaint: Decreased flows  Interval H&P   The patient has presented today for tunneled catheter insertion to initiate dialysis.  Various methods of treatment have been discussed with the patient.  After consideration of risk, benefits and other options for treatment, the patient has consented to a tunneled catheter insertion.   Risks  of bleeding, pain, infection, nonhealing wound, lung and carotid artery injury were explained to the patient.  The patient's history has been reviewed and the patient has been examined, no changes in status.  Stable for tunneled catheter insertion.  I have reviewed the patient's chart and labs.  Questions were answered to the patient's satisfaction.  Assessment/Plan: ESRD transitioning from PD to HD  Decreased access flows - planning on angiogram with possibly angioplasty. Renal osteodystrophy - continue binders per home regimen. Anemia - managed with ESA's and IV iron  at dialysis center. HTN - resume home regimen.   HPI: Daniel Solomon. is an 63 y.o. male with history of bicuspid aortic valve, coronary atherosclerotic heart disease, atrial fibrillation, hypertension, nephrolithiasis, TAVR, OSA, ESRD.  He is transitioning from PD to HD.  ROS Per HPI.  Chemistry and CBC: Creatinine  Date/Time Value Ref Range Status  01/29/2020 12:00 AM 8.1 (A) 0.6 - 1.3 Final  12/23/2019 12:00 AM 6.0 (A) 0.6 - 1.3 Final  04/24/2018 12:00 AM 2.3 (A) 0.6 - 1.3 Final  03/17/2014 12:00 AM 1.6 (A) 0.6 - 1.3 mg/dL Final   Creat  Date/Time Value Ref Range Status  01/28/2016 08:02 AM 1.81 (H) 0.70 - 1.33 mg/dL Final    Comment:      For patients > or = 64 years of age: The upper reference limit for Creatinine is approximately 13% higher for people identified as African-American.     01/12/2016 08:49 AM 1.81 (H) 0.70 - 1.33 mg/dL Final    Comment:      For patients > or = 63 years of age: The upper reference limit for Creatinine is approximately 13%  higher for people identified as African-American.      Creatinine, Ser  Date/Time Value Ref Range Status  06/05/2023 10:05 AM 15.11 (HH) 0.76 - 1.27 mg/dL Final    Comment:    **Verified by repeat analysis**  04/28/2023 10:08 AM 16.14 (HH) 0.40 - 1.50 mg/dL Final  40/11/2723 36:64 AM 17.90 (H) 0.61 - 1.24 mg/dL Final  40/34/7425 95:63 AM 15.49 (H) 0.61 - 1.24 mg/dL Final  87/56/4332 95:18 AM 16.09 (H) 0.61 - 1.24 mg/dL Final  84/16/6063 01:60 AM 13.86 (H) 0.61 - 1.24 mg/dL Final  10/93/2355 73:22 PM 13.34 (H) 0.61 - 1.24 mg/dL Final  02/54/2706 23:76 AM 14.10 (H) 0.61 - 1.24 mg/dL Final  28/31/5176 16:07 PM 11.45 (H) 0.61 - 1.24 mg/dL Final  37/11/6267 48:54 AM 12.93 (H) 0.61 - 1.24 mg/dL Final  62/70/3500 93:81 AM 14.40 (H) 0.61 - 1.24 mg/dL Final  82/99/3716 96:78 PM 13.40 (H) 0.61 - 1.24 mg/dL Final  93/81/0175 10:25 AM 13.51 (H) 0.61 - 1.24 mg/dL Final  85/27/7824 23:53 PM 13.81 (H) 0.61 - 1.24 mg/dL Final  61/44/3154 00:86 AM 14.34 (H) 0.61 - 1.24 mg/dL Final  76/19/5093 26:71 AM 14.57 (H) 0.61 - 1.24 mg/dL Final  24/58/0998 33:82 AM 15.22 (H) 0.61 - 1.24 mg/dL Final  50/53/9767 34:19 AM 16.34 (H) 0.61 - 1.24 mg/dL Final  37/90/2409 73:53 AM 17.36 (H) 0.61 - 1.24 mg/dL Final  29/92/4268 34:19 AM 18.03 (H) 0.61 - 1.24 mg/dL Final  62/22/9798 92:11 PM 18.17 (H) 0.61 -  1.24 mg/dL Final  40/11/2723 36:64 PM >18.00 (H) 0.61 - 1.24 mg/dL Final  40/34/7425 95:63 AM 14.67 (H) 0.61 - 1.24 mg/dL Final  87/56/4332 95:18 PM 14.97 (H) 0.61 - 1.24 mg/dL Final  84/16/6063 01:60 AM 13.93 (H) 0.61 - 1.24 mg/dL Final  10/93/2355 73:22 AM 13.80 (H) 0.61 - 1.24 mg/dL Final  02/54/2706 23:76 AM 13.30 (H) 0.61 - 1.24 mg/dL Final  28/31/5176 16:07 AM 15.10 (H) 0.61 - 1.24 mg/dL Final  37/11/6267 48:54 AM 13.60 (H) 0.61 - 1.24 mg/dL Final  62/70/3500 93:81 PM 12.68 (H) 0.76 - 1.27 mg/dL Final    Comment:    **Verified by repeat analysis**  11/15/2019 09:10 AM 5.31 (H) 0.76 - 1.27 mg/dL Final   82/99/3716 96:78 AM 2.78 (H) 0.40 - 1.50 mg/dL Final  93/81/0175 10:25 AM 1.97 (H) 0.76 - 1.27 mg/dL Final  85/27/7824 23:53 AM 1.95 (H) 0.40 - 1.50 mg/dL Final  61/44/3154 00:86 AM 1.89 (H) 0.76 - 1.27 mg/dL Final  76/19/5093 26:71 AM 1.86 (H) 0.40 - 1.50 mg/dL Final  24/58/0998 33:82 AM 1.55 (H) 0.40 - 1.50 mg/dL Final  50/53/9767 34:19 AM 1.47 0.40 - 1.50 mg/dL Final  37/90/2409 73:53 PM 1.5 0.4 - 1.5 mg/dL Final  29/92/4268 34:19 AM 1.6 (H) 0.4 - 1.5 mg/dL Final  62/22/9798 92:11 AM 1.23 0.50 - 1.35 mg/dL Final  94/17/4081 44:81 AM 1.41 (H) 0.50 - 1.35 mg/dL Final  85/63/1497 02:63 PM 1.43 (H) 0.50 - 1.35 mg/dL Final   No results for input(s): "NA", "K", "CL", "CO2", "GLUCOSE", "BUN", "CREATININE", "CALCIUM ", "PHOS" in the last 168 hours.  Invalid input(s): "ALB" No results for input(s): "WBC", "NEUTROABS", "HGB", "HCT", "MCV", "PLT" in the last 168 hours. Liver Function Tests: No results for input(s): "AST", "ALT", "ALKPHOS", "BILITOT", "PROT", "ALBUMIN " in the last 168 hours. No results for input(s): "LIPASE", "AMYLASE" in the last 168 hours. No results for input(s): "AMMONIA" in the last 168 hours. Cardiac Enzymes: No results for input(s): "CKTOTAL", "CKMB", "CKMBINDEX", "TROPONINI" in the last 168 hours. Iron  Studies: No results for input(s): "IRON ", "TIBC", "TRANSFERRIN", "FERRITIN" in the last 72 hours. PT/INR: @LABRCNTIP (inr:5)  Xrays/Other Studies: )No results found for this or any previous visit (from the past 48 hours). No results found.  PMH:   Past Medical History:  Diagnosis Date   Anemia    Anemia in chronic kidney disease 03/20/2020   Bicuspid aortic valve    Blood transfusion without reported diagnosis    CAD (coronary artery disease)    proximal LAD 50%, mid LAD 50-60%, mid to distal LAD 99%; mid circumflex 20%; proximal RCA 20%, distal RCA 50%; PDA 60-70%, proximal posterior AV groove 50%, proximal posterior lateral 50%.  There was a question of possible  LVOT gradient;  s/p Resolute DES to mid-distal LAD 10/2010;  echocardiogram 11/17/10: EF 55-60%, mild LVH, grade 1 diastolic dysfunction, normal aortic valve, mild MR, PASP 32.    Chicken pox    Dysrhythmia    A. Fib   Enlarged aorta ([name])    ESRD on peritoneal dialysis ([name])    Glaucoma    Gout    allopurinol  300mg , colchicine  prn in past, has not had flares   Headache    History of kidney stones    HTN (hypertension) 12/06/2010   Lisinopril  20mg ,  amlodipine  5mg . Home cuff runs high- likely 20/10 higher at least.      HCTZ- stopped by Dr. Arlis Lakes     Hypercholesterolemia    With hypertriglyceridemia   Hypertension  Mild aortic stenosis    PAF (paroxysmal atrial fibrillation) ([name])    a. Documented on event monitor 01/2016.   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   SDH (subdural hematoma) ([name]) 08/2021   left   Sleep apnea    cpap nightly    PSH:   Past Surgical History:  Procedure Laterality Date   COLONOSCOPY  2018   CORONARY STENT PLACEMENT  02/28/2010   CRANIOPLASTY N/A 02/25/2022   Procedure: Cranioplasty with custom plate;  Surgeon: Audie Bleacher, MD;  Location: [facility] OR;  Service: Neurosurgery;  Laterality: N/A;   CRANIOPLASTY N/A 06/17/2022   Procedure: REVISION OF CRANIOPLASTY/PLACEMENT OF LUMBAR DRAIN;  Surgeon: Audie Bleacher, MD;  Location: [facility] OR;  Service: Neurosurgery;  Laterality: N/A;   CRANIOTOMY Left 10/05/2021   Procedure: CRANIOTOMY HEMATOMA EVACUATION SUBDURAL;  Surgeon: Audie Bleacher, MD;  Location: [facility] OR;  Service: Neurosurgery;  Laterality: Left;   CRANIOTOMY Left 10/07/2021   Procedure: Craniectomy for Recurrent  Intracranial Hematoma;  Surgeon: Audie Bleacher, MD;  Location: [facility] OR;  Service: Neurosurgery;  Laterality: Left;   CYSTOSCOPY/RETROGRADE/URETEROSCOPY  03/26/2012   Procedure:  CYSTOSCOPY/RETROGRADE/URETEROSCOPY;  Surgeon: Alanson Alliance, MD;  Location: [facility];  Service: Urology;  Laterality: Bilateral;  CYSTOSCOPY BILATERAL RETROGRADE PYELOGRAM AND POSSIBLE URETEROSCOPY   HERNIA REPAIR  2021   Umbilical Repair   INTRAOPERATIVE TRANSTHORACIC ECHOCARDIOGRAM N/A 01/19/2021   Procedure: INTRAOPERATIVE TRANSTHORACIC ECHOCARDIOGRAM;  Surgeon: Odie Benne, MD;  Location: [facility] OR;  Service: Open Heart Surgery;  Laterality: N/A;   IR ANGIO EXTERNAL CAROTID SEL EXT CAROTID UNI L MOD SED  09/28/2021   IR ANGIO INTRA EXTRACRAN SEL INTERNAL CAROTID UNI L MOD SED  09/28/2021   IR ANGIOGRAM FOLLOW UP STUDY  09/28/2021   IR ANGIOGRAM FOLLOW UP STUDY  09/30/2021   IR NEURO EACH ADD'L AFTER BASIC UNI LEFT (MS)  09/28/2021   IR NEURO EACH ADD'L AFTER BASIC UNI RIGHT (MS)  09/30/2021   IR TRANSCATH/EMBOLIZ  09/28/2021   LEFT HEART CATH AND CORONARY ANGIOGRAPHY N/A 06/09/2023   Procedure: LEFT HEART CATH AND CORONARY ANGIOGRAPHY;  Surgeon: Odie Benne, MD;  Location: [facility];  Service: Cardiovascular;  Laterality: N/A;   PLACEMENT OF LUMBAR DRAIN N/A 06/17/2022   Procedure: PLACEMENT OF LUMBAR DRAIN;  Surgeon: Audie Bleacher, MD;  Location: [facility] OR;  Service: Neurosurgery;  Laterality: N/A;   RADIOLOGY WITH ANESTHESIA N/A 09/28/2021   Procedure: Middle meningeal artery embolization;  Surgeon: Augusto Blonder, MD;  Location: [facility] OR;  Service: Radiology;  Laterality: N/A;   RIGHT HEART CATH AND CORONARY ANGIOGRAPHY N/A 12/04/2020   Procedure: RIGHT HEART CATH AND CORONARY ANGIOGRAPHY;  Surgeon: Odie Benne, MD;  Location: [facility];  Service: Cardiovascular;  Laterality: N/A;   TRANSCATHETER AORTIC VALVE REPLACEMENT, TRANSFEMORAL N/A 01/19/2021   Procedure: TRANSCATHETER AORTIC VALVE REPLACEMENT, TRANSFEMORAL USING A EDWARDS VALVE.;  Surgeon: Odie Benne, MD;  Location: [facility] OR;  Service: Open Heart  Surgery;  Laterality: N/A;    Allergies: No Known Allergies  Medications:   Prior to Admission medications   Medication Sig Start Date End Date Taking? Authorizing Provider  allopurinol  (ZYLOPRIM ) 300 MG tablet TAKE 1 TABLET BY MOUTH EVERY DAY 01/04/22   Almira Jaeger, MD  amoxicillin  (AMOXIL ) 500 MG tablet Take FOUR tablets (2000 mg) by mouth ONE  HOUR BEFORE ANY DENTAL APPOINTMENT (including cleanings) 02/02/23   Odie Benne, MD  aspirin  EC 81 MG tablet Take 1 tablet (81 mg total) by mouth daily. Swallow whole. 06/05/23   Odie Benne, MD  atorvastatin  (LIPITOR) 40 MG tablet TAKE 1 TABLET BY MOUTH EVERY DAY 01/04/22   Almira Jaeger, MD  AURYXIA  1 GM 210 MG(Fe) tablet Take 630 mg by mouth 3 (three) times daily with meals. 12/23/20   [provider]  calcitRIOL  (ROCALTROL ) 0.25 MCG capsule Take 0.25 mcg by mouth. 3x a week per Dr. Jearldine Mina 01/29/21   [provider]  cinacalcet (SENSIPAR) 60 MG tablet Take 60 mg by mouth daily.    [provider]  gabapentin  (NEURONTIN ) 100 MG capsule Take 100 mg by mouth at bedtime.    [provider]  gentamicin  cream (GARAMYCIN ) 0.1 % Apply 1 Application topically daily. 04/07/22   [provider]  Methoxy PEG-Epoetin Beta (MIRCERA IJ) Inject into the skin as needed. 06/15/20   [provider]  NON FORMULARY Pt uses a cpap nightly    [provider]  Tenapanor  HCl, CKD, (XPHOZAH ) 30 MG TABS Take 30 mg by mouth daily.    [provider]    Discontinued Meds:  There are no discontinued medications.  Social History:  reports that he has never smoked. He has never used smokeless tobacco. He reports that he does not drink alcohol and does not use drugs.  Family History:   Family History  Problem Relation Age of Onset   Hypertension Father    CAD Father        CABG 61s   Kidney disease Father        Stage IV   Valvular heart disease Mother        AVR July 2015    Diabetes Paternal Grandmother    Colon cancer Neg Hx     Blood pressure (!) 83/67, pulse 83, temperature 97.8 F (36.6 C), SpO2 100%. GEN: NAD, A&Ox3, NCAT HEENT: No conjunctival pallor, EOMI NECK: Supple, no thyromegaly LUNGS: CTA B/L no rales, rhonchi or wheezing CV: RRR, No M/R/G ABD: SNDNT +BS, PD cath EXT: No lower extremity edema        Rene Gonsoulin, Alveda Aures, MD 06/22/2023, 12:46 PM

## 2023-06-26 ENCOUNTER — Ambulatory Visit: Admitting: Cardiovascular Disease

## 2023-06-29 DIAGNOSIS — Z992 Dependence on renal dialysis: Secondary | ICD-10-CM | POA: Diagnosis not present

## 2023-06-29 DIAGNOSIS — T8249XA Other complication of vascular dialysis catheter, initial encounter: Secondary | ICD-10-CM | POA: Diagnosis not present

## 2023-06-29 DIAGNOSIS — D631 Anemia in chronic kidney disease: Secondary | ICD-10-CM | POA: Diagnosis not present

## 2023-06-29 DIAGNOSIS — N2581 Secondary hyperparathyroidism of renal origin: Secondary | ICD-10-CM | POA: Diagnosis not present

## 2023-06-29 DIAGNOSIS — N186 End stage renal disease: Secondary | ICD-10-CM | POA: Diagnosis not present

## 2023-06-29 DIAGNOSIS — Z4931 Encounter for adequacy testing for hemodialysis: Secondary | ICD-10-CM | POA: Diagnosis not present

## 2023-07-04 DIAGNOSIS — I77810 Thoracic aortic ectasia: Secondary | ICD-10-CM | POA: Diagnosis not present

## 2023-07-04 DIAGNOSIS — Z992 Dependence on renal dialysis: Secondary | ICD-10-CM | POA: Diagnosis not present

## 2023-07-04 DIAGNOSIS — I480 Paroxysmal atrial fibrillation: Secondary | ICD-10-CM | POA: Diagnosis not present

## 2023-07-04 DIAGNOSIS — G4733 Obstructive sleep apnea (adult) (pediatric): Secondary | ICD-10-CM | POA: Diagnosis not present

## 2023-07-04 DIAGNOSIS — N186 End stage renal disease: Secondary | ICD-10-CM | POA: Diagnosis not present

## 2023-07-04 DIAGNOSIS — E782 Mixed hyperlipidemia: Secondary | ICD-10-CM | POA: Diagnosis not present

## 2023-07-04 DIAGNOSIS — I120 Hypertensive chronic kidney disease with stage 5 chronic kidney disease or end stage renal disease: Secondary | ICD-10-CM | POA: Diagnosis not present

## 2023-07-04 DIAGNOSIS — I2510 Atherosclerotic heart disease of native coronary artery without angina pectoris: Secondary | ICD-10-CM | POA: Diagnosis not present

## 2023-07-07 ENCOUNTER — Other Ambulatory Visit: Payer: Self-pay | Admitting: *Deleted

## 2023-07-07 ENCOUNTER — Encounter (HOSPITAL_COMMUNITY): Payer: Self-pay

## 2023-07-07 DIAGNOSIS — N186 End stage renal disease: Secondary | ICD-10-CM

## 2023-07-12 ENCOUNTER — Ambulatory Visit (HOSPITAL_COMMUNITY)

## 2023-07-12 ENCOUNTER — Ambulatory Visit (HOSPITAL_COMMUNITY): Attending: Cardiology

## 2023-07-12 DIAGNOSIS — I2510 Atherosclerotic heart disease of native coronary artery without angina pectoris: Secondary | ICD-10-CM | POA: Insufficient documentation

## 2023-07-12 DIAGNOSIS — Z01810 Encounter for preprocedural cardiovascular examination: Secondary | ICD-10-CM | POA: Insufficient documentation

## 2023-07-12 DIAGNOSIS — R9439 Abnormal result of other cardiovascular function study: Secondary | ICD-10-CM | POA: Insufficient documentation

## 2023-07-12 DIAGNOSIS — Z01818 Encounter for other preprocedural examination: Secondary | ICD-10-CM | POA: Insufficient documentation

## 2023-07-12 LAB — EXERCISE TOLERANCE TEST
Angina Index: 0
Duke Treadmill Score: 3
Estimated workload: 4.8
Exercise duration (min): 3 min
Exercise duration (sec): 14 s
MPHR: 158 {beats}/min
Peak HR: 139 {beats}/min
Percent HR: 87 %
Rest HR: 67 {beats}/min
ST Depression (mm): 0 mm

## 2023-07-21 ENCOUNTER — Ambulatory Visit (HOSPITAL_COMMUNITY)
Admission: RE | Admit: 2023-07-21 | Discharge: 2023-07-21 | Disposition: A | Discharge status: Home / Self Care | Payer: Self-pay | Source: Ambulatory Visit | Attending: Vascular Surgery | Admitting: Vascular Surgery

## 2023-07-21 ENCOUNTER — Ambulatory Visit (HOSPITAL_BASED_OUTPATIENT_CLINIC_OR_DEPARTMENT_OTHER)
Admission: RE | Admit: 2023-07-21 | Discharge: 2023-07-21 | Disposition: A | Discharge status: Home / Self Care | Payer: Self-pay | Source: Ambulatory Visit | Attending: Vascular Surgery | Admitting: Vascular Surgery

## 2023-07-21 DIAGNOSIS — N186 End stage renal disease: Secondary | ICD-10-CM | POA: Diagnosis not present

## 2023-07-25 ENCOUNTER — Ambulatory Visit (INDEPENDENT_AMBULATORY_CARE_PROVIDER_SITE_OTHER)

## 2023-07-25 VITALS — Ht 68.0 in | Wt 210.0 lb

## 2023-07-25 DIAGNOSIS — Z0000 Encounter for general adult medical examination without abnormal findings: Secondary | ICD-10-CM

## 2023-07-25 NOTE — Patient Instructions (Signed)
 Daniel Solomon , Thank you for taking time out of your busy schedule to complete your Annual Wellness Visit with me. I enjoyed our conversation and look forward to speaking with you again next year. I, as well as your care team,  appreciate your ongoing commitment to your health goals. Please review the following plan we discussed and let me know if I can assist you in the future. Your Game plan/ To Do List    Referrals: If you haven't heard from the office you've been referred to, please reach out to them at the phone provided.   Follow up Visits: Next Medicare AWV with our clinical staff: 07/30/24   Have you seen your provider in the last 6 months (3 months if uncontrolled diabetes)? Yes Next Office Visit with your provider: Pt will make a office visit to follow up with leg pain   Clinician Recommendations: Each day, aim for 6 glasses of water , plenty of protein in your diet and try to get up and walk/ stretch every hour for 5-10 minutes at a time.        This is a list of the screening recommended for you and due dates:  Health Maintenance  Topic Date Due   Medicare Annual Wellness Visit  Never done   Flu Shot  09/29/2023   Colon Cancer Screening  12/08/2024   Pneumococcal Vaccination (3 of 3 - PPSV23, PCV20 or PCV21) 05/26/2025   DTaP/Tdap/Td vaccine (3 - Td or Tdap) 10/23/2028   Hepatitis C Screening  Completed   HIV Screening  Completed   Zoster (Shingles) Vaccine  Completed   HPV Vaccine  Aged Out   Meningitis B Vaccine  Aged Out   COVID-19 Vaccine  Discontinued    Advanced directives: (Copy Requested) Please bring a copy of your health care power of attorney and living will to the office to be added to your chart at your convenience. You can mail to [facility] [address] [place], [place] [address] or email to [email or web address] Advance Care Planning is important because it:  [x]  Makes sure you receive the medical care that is consistent with your  values, goals, and preferences  [x]  It provides guidance to your family and loved ones and reduces their decisional burden about whether or not they are making the right decisions based on your wishes.  Follow the link provided in your after visit summary or read over the paperwork we have mailed to you to help you started getting your Advance Directives in place. If you need assistance in completing these, please reach out to us  so that we can help you!  See attachments for Preventive Care and Fall Prevention Tips.

## 2023-07-25 NOTE — Progress Notes (Signed)
 Subjective:   Daniel Solomon. is a 63 y.o. who presents for a Medicare Wellness preventive visit.  As a reminder, Annual Wellness Visits don't include a physical exam, and some assessments may be limited, especially if this visit is performed virtually. We may recommend an in-person follow-up visit with your provider if needed.  Visit Complete: Virtual I connected with  Daniel Solomon. on 07/27/23 by a audio enabled telemedicine application and verified that I am speaking with the correct person using two identifiers.  Patient Location: Home  Provider Location: Office/Clinic  I discussed the limitations of evaluation and management by telemedicine. The patient expressed understanding and agreed to proceed.  Vital Signs: Because this visit was a virtual/telehealth visit, some criteria may be missing or patient reported. Any vitals not documented were not able to be obtained and vitals that have been documented are patient reported.  VideoDeclined- This patient declined Librarian, academic. Therefore the visit was completed with audio only.  Persons Participating in Visit: Patient.  AWV Questionnaire: Yes: Patient Medicare AWV questionnaire was completed by the patient on 07/21/23; I have confirmed that all information answered by patient is correct and no changes since this date.  Cardiac Risk Factors include: advanced age (>18men, >44 women);dyslipidemia;male gender;obesity (BMI >30kg/m2)     Objective:     Today's Vitals   07/21/23 0956 07/25/23 0805  Weight:  210 lb (95.3 kg)  Height:  5\' 8"  (1.727 m)  PainSc: 2     Body mass index is 31.93 kg/m.     07/25/2023    8:09 AM 06/09/2023    8:57 AM 04/21/2023    8:57 AM 06/17/2022    6:32 AM 06/08/2022    3:32 PM 02/25/2022    6:00 PM 02/17/2022    3:35 PM  Advanced Directives  Does Patient Have a Medical Advance Directive? Yes Yes Yes Yes Yes Yes Yes  Type of Engineer, mining of [place];Living will Living will;Healthcare Power of [employer] Power of [place];Living will Healthcare Power of [place];Living will Healthcare Power of [place];Living will Healthcare Power of [place];Living will Healthcare Power of [place];Living will  Does patient want to make changes to medical advance directive?    No - Patient declined No - Patient declined No - Patient declined   Copy of Healthcare Power of Attorney in Chart? No - copy requested   No - copy requested No - copy requested No - copy requested No - copy requested    Current Medications (verified) Outpatient Encounter Medications as of 07/25/2023  Medication Sig   allopurinol  (ZYLOPRIM ) 300 MG tablet TAKE 1 TABLET BY MOUTH EVERY DAY   aspirin  EC 81 MG tablet Take 1 tablet (81 mg total) by mouth daily. Swallow whole.   atorvastatin  (LIPITOR) 40 MG tablet TAKE 1 TABLET BY MOUTH EVERY DAY   AURYXIA  1 GM 210 MG(Fe) tablet Take 630 mg by mouth 3 (three) times daily with meals.   calcitRIOL  (ROCALTROL ) 0.25 MCG capsule Take 0.25 mcg by mouth. 3x a week per Dr. Jearldine Mina   cinacalcet (SENSIPAR) 60 MG tablet Take 60 mg by mouth daily.   gentamicin  cream (GARAMYCIN ) 0.1 % Apply 1 Application topically daily.   Methoxy PEG-Epoetin Beta (MIRCERA IJ) Inject into the skin as needed.   NON FORMULARY Pt uses a cpap nightly   Tenapanor  HCl, CKD, (XPHOZAH ) 30 MG TABS Take 30 mg by mouth daily.   amoxicillin  (AMOXIL ) 500 MG tablet Take FOUR tablets (  2000 mg) by mouth ONE HOUR BEFORE ANY DENTAL APPOINTMENT (including cleanings)   [DISCONTINUED] gabapentin  (NEURONTIN ) 100 MG capsule Take 100 mg by mouth at bedtime.   No facility-administered encounter medications on file as of 07/25/2023.    Allergies (verified) Patient has no known allergies.   History: Past Medical History:  Diagnosis Date   Anemia    Anemia in chronic kidney disease 03/20/2020   Bicuspid aortic valve    Blood transfusion without reported diagnosis     CAD (coronary artery disease)    proximal LAD 50%, mid LAD 50-60%, mid to distal LAD 99%; mid circumflex 20%; proximal RCA 20%, distal RCA 50%; PDA 60-70%, proximal posterior AV groove 50%, proximal posterior lateral 50%.  There was a question of possible LVOT gradient;  s/p Resolute DES to mid-distal LAD 10/2010;  echocardiogram 11/17/10: EF 55-60%, mild LVH, grade 1 diastolic dysfunction, normal aortic valve, mild MR, PASP 32.    Chicken pox    Dysrhythmia    A. Fib   Enlarged aorta ([name])    ESRD on peritoneal dialysis ([name])    Glaucoma    Gout    allopurinol  300mg , colchicine  prn in past, has not had flares   Headache    History of kidney stones    HTN (hypertension) 12/06/2010   Lisinopril  20mg ,  amlodipine  5mg . Home cuff runs high- likely 20/10 higher at least.      HCTZ- stopped by Dr. Arlis Lakes     Hypercholesterolemia    With hypertriglyceridemia   Hypertension    Mild aortic stenosis    PAF (paroxysmal atrial fibrillation) ([name])    a. Documented on event monitor 01/2016.   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   SDH (subdural hematoma) ([name]) 08/2021   left   Sleep apnea    cpap nightly   Past Surgical History:  Procedure Laterality Date   COLONOSCOPY  2018   CORONARY STENT PLACEMENT  02/28/2010   CRANIOPLASTY N/A 02/25/2022   Procedure: Cranioplasty with custom plate;  Surgeon: Audie Bleacher, MD;  Location: [facility] OR;  Service: Neurosurgery;  Laterality: N/A;   CRANIOPLASTY N/A 06/17/2022   Procedure: REVISION OF CRANIOPLASTY/PLACEMENT OF LUMBAR DRAIN;  Surgeon: Audie Bleacher, MD;  Location: [facility] OR;  Service: Neurosurgery;  Laterality: N/A;   CRANIOTOMY Left 10/05/2021   Procedure: CRANIOTOMY HEMATOMA EVACUATION SUBDURAL;  Surgeon: Audie Bleacher, MD;  Location: [facility] OR;  Service: Neurosurgery;  Laterality: Left;   CRANIOTOMY Left 10/07/2021    Procedure: Craniectomy for Recurrent  Intracranial Hematoma;  Surgeon: Audie Bleacher, MD;  Location: [facility] OR;  Service: Neurosurgery;  Laterality: Left;   CYSTOSCOPY/RETROGRADE/URETEROSCOPY  03/26/2012   Procedure: CYSTOSCOPY/RETROGRADE/URETEROSCOPY;  Surgeon: Alanson Alliance, MD;  Location: [facility];  Service: Urology;  Laterality: Bilateral;  CYSTOSCOPY BILATERAL RETROGRADE PYELOGRAM AND POSSIBLE URETEROSCOPY   DIALYSIS/PERMA CATHETER INSERTION Right 06/22/2023   Procedure: DIALYSIS/PERMA CATHETER INSERTION;  Surgeon: Patrick Boor, MD;  Location: [facility] INVASIVE CV LAB;  Service: Cardiovascular;  Laterality: Right;   HERNIA REPAIR  2021   Umbilical Repair   INTRAOPERATIVE TRANSTHORACIC ECHOCARDIOGRAM N/A 01/19/2021   Procedure: INTRAOPERATIVE TRANSTHORACIC ECHOCARDIOGRAM;  Surgeon: Odie Benne, MD;  Location: [facility] OR;  Service: Open Heart Surgery;  Laterality: N/A;   IR ANGIO EXTERNAL CAROTID SEL EXT CAROTID UNI L MOD SED  09/28/2021   IR ANGIO INTRA EXTRACRAN SEL  INTERNAL CAROTID UNI L MOD SED  09/28/2021   IR ANGIOGRAM FOLLOW UP STUDY  09/28/2021   IR ANGIOGRAM FOLLOW UP STUDY  09/30/2021   IR NEURO EACH ADD'L AFTER BASIC UNI LEFT (MS)  09/28/2021   IR NEURO EACH ADD'L AFTER BASIC UNI RIGHT (MS)  09/30/2021   IR TRANSCATH/EMBOLIZ  09/28/2021   LEFT HEART CATH AND CORONARY ANGIOGRAPHY N/A 06/09/2023   Procedure: LEFT HEART CATH AND CORONARY ANGIOGRAPHY;  Surgeon: Odie Benne, MD;  [facility]: [facility] INVASIVE CV LAB;  Service: Cardiovascular;  Laterality: N/A;   PLACEMENT OF LUMBAR DRAIN N/A 06/17/2022   Procedure: PLACEMENT OF LUMBAR DRAIN;  Surgeon: Audie Bleacher, MD;  Location: [facility] OR;  Service: Neurosurgery;  Laterality: N/A;   RADIOLOGY WITH ANESTHESIA N/A 09/28/2021   Procedure: Middle meningeal artery embolization;  Surgeon: Augusto Blonder, MD;  [facility] OR;  Service: Radiology;  Laterality: N/A;   RIGHT HEART CATH AND CORONARY ANGIOGRAPHY N/A  12/04/2020   Procedure: RIGHT HEART CATH AND CORONARY ANGIOGRAPHY;  Surgeon: Odie Benne, MD;  Location: [facility] INVASIVE CV LAB;  Service: Cardiovascular;  Laterality: N/A;   TRANSCATHETER AORTIC VALVE REPLACEMENT, TRANSFEMORAL N/A 01/19/2021   Procedure: TRANSCATHETER AORTIC VALVE REPLACEMENT, TRANSFEMORAL USING A EDWARDS VALVE.;  Surgeon: Odie Benne, MD;  Location: [facility] OR;  Service: Open Heart Surgery;  Laterality: N/A;   Family History  Problem Relation Age of Onset   Hypertension Father    CAD Father        CABG 60s   Kidney disease Father        Stage IV   Valvular heart disease Mother        AVR July 2015   Diabetes Paternal Grandmother    Colon cancer Neg Hx    Social History   Socioeconomic History   Marital status: Married    Spouse name: Not on file   Number of children: 4   Years of education: Not on file   Highest education level: Bachelor's degree (e.g., BA, AB, BS)  Occupational History   Occupation: Research scientist (physical sciences): [employer]  Tobacco Use   Smoking status: Never   Smokeless tobacco: Never  Vaping Use   Vaping status: Never Used  Substance and Sexual Activity   Alcohol use: No   Drug use: No   Sexual activity: Yes  Other Topics Concern   Not on file  Social History Narrative   Family: Married, 4 children, 10 grandkids in 2025         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, manufacturing systems- looking for work   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)   BS at [employer]: reading, church activities- L[employer]   Social Drivers of Corporate investment banker Strain: Low Risk  (07/21/2023)   Overall Financial Resource Strain (CARDIA)    Difficulty of Paying Living Expenses: Not hard at all  Food Insecurity: No Food Insecurity (07/21/2023)   Hunger Vital Sign    Worried About Running Out of Food in the Last Year: Never true    Ran Out of Food in the Last Year: Never true  Transportation Needs: No  Transportation Needs (07/21/2023)   PRAPARE - Administrator, Civil Service (Medical): No    Lack of Transportation (Non-Medical): No  Physical Activity: Insufficiently Active (07/21/2023)   Exercise Vital Sign    Days of Exercise per Week: 1  day    Minutes of Exercise per Session: 10 min  Stress: No Stress Concern Present (07/21/2023)   [employer] of Occupational Health - Occupational Stress Questionnaire    Feeling of Stress : Only a little  Social Connections: Socially Integrated (07/21/2023)   Social Connection and Isolation Panel [NHANES]    Frequency of Communication with Friends and Family: Twice a week    Frequency of Social Gatherings with Friends and Family: Once a week    Attends Religious Services: More than 4 times per year    Active Member of [employer] or Organizations: Yes    Attends Engineer, structural: More than 4 times per year    Marital Status: Married    Tobacco Counseling Counseling given: Not Answered    Clinical Intake:  Pre-visit preparation completed: Yes  Pain : 0-10 Pain Score: 2  Pain Type: Chronic pain Pain Location: Leg Pain Descriptors / Indicators: Aching, Sore Pain Onset: More than a month ago Pain Frequency: Intermittent     BMI - recorded: 31.93 Nutritional Status: BMI > 30  Obese Diabetes: No  Lab Results  Component Value Date   HGBA1C 5.1 01/12/2016   HGBA1C 5.7 (H) 11/16/2010     How often do you need to have someone help you when you read instructions, pamphlets, or other written materials from your doctor or pharmacy?: 1 - Never  Interpreter Needed?: No  Information entered by :: Lamont Pilsner, LPN   Activities of Daily Living     07/21/2023    9:56 AM 06/22/2023   12:17 PM  In your present state of health, do you have any difficulty performing the following activities:  Hearing? 0 0  Vision? 0 0  Difficulty concentrating or making decisions? 0 0  Walking or climbing stairs? 1   Comment use  of cane   Dressing or bathing? 0   Doing errands, shopping? 0   Preparing Food and eating ? N   Using the Toilet? N   In the past six months, have you accidently leaked urine? N   Do you have problems with loss of bowel control? N   Managing your Medications? N   Managing your Finances? N   Housekeeping or managing your Housekeeping? N     Patient Care Team: Almira Jaeger, MD as PCP - General (Family Medicine) Odie Benne, MD as PCP - Cardiology (Cardiology) Katie Parks, MD as Consulting Physician (Nephrology) Charletta Cons, MD as Attending Physician (Nephrology)  Indicate any recent Medical Services you may have received from other than [facility] providers in the past year (date may be approximate).     Assessment:    This is a routine wellness examination for Daniel Solomon.  Hearing/Vision screen Hearing Screening - Comments:: Pt denies any hearing issues  Vision Screening - Comments:: Wears rx glasses - up to date with routine eye exams with [place] [facility]     Goals Addressed             This Visit's Progress    Patient Stated       Maintain health and activity        Depression Screen     07/25/2023    8:10 AM 09/22/2020   10:18 AM 10/24/2018    9:18 AM 12/14/2017   11:58 AM 10/04/2016    9:22 AM  PHQ 2/9 Scores  PHQ - 2 Score 0 0 0 0 0    Fall Risk     07/21/2023  9:56 AM 12/14/2017   11:58 AM 10/04/2016    9:22 AM  Fall Risk   Falls in the past year? 0 No No  Number falls in past yr: 0    Injury with Fall? 0    Risk for fall due to : Impaired mobility    Follow up Falls prevention discussed      MEDICARE RISK AT HOME:  Medicare Risk at Home Any stairs in or around the home?: (Patient-Rptd) Yes If so, are there any without handrails?: (Patient-Rptd) Yes Home free of loose throw rugs in walkways, pet beds, electrical cords, etc?: (Patient-Rptd) Yes Adequate lighting in your home to reduce risk of falls?: (Patient-Rptd) Yes Life  alert?: (Patient-Rptd) No Use of a cane, walker or w/c?: (Patient-Rptd) Yes Grab bars in the bathroom?: (Patient-Rptd) No Shower chair or bench in shower?: (Patient-Rptd) Yes Elevated toilet seat or a handicapped toilet?: (Patient-Rptd) No  TIMED UP AND GO:  Was the test performed?  No  Cognitive Function: 6CIT completed        07/25/2023    8:11 AM  6CIT Screen  What Year? 0 points  What month? 0 points  What time? 0 points  Count back from 20 0 points  Months in reverse 0 points  Repeat phrase 0 points  Total Score 0 points    Immunizations Immunization History  Administered Date(s) Administered   Influenza, Quadrivalent, Recombinant, Inj, Pf 12/21/2021   Influenza,inj,Quad PF,6+ Mos 03/24/2020, 12/02/2020   Influenza-Unspecified 01/08/2023   Janssen (J&J) SARS-COV-2 Vaccination 05/22/2019   Measles 09/18/1975   Moderna Sars-Covid-2 Vaccination 02/07/2020   Pfizer Covid-19 Vaccine Bivalent Booster 74yrs & up 06/02/2021   Pneumococcal Conjugate-13 03/24/2020   Pneumococcal Polysaccharide-23 05/26/2020   Rubella 09/18/1975   Td 03/01/2008   Tdap 10/24/2018   Zoster Recombinant(Shingrix) 06/02/2021, 08/02/2021    Screening Tests Health Maintenance  Topic Date Due   INFLUENZA VACCINE  09/29/2023   Medicare Annual Wellness (AWV)  07/24/2024   Colonoscopy  12/08/2024   Pneumococcal Vaccine 32-72 Years old (3 of 3 - PPSV23, PCV20 or PCV21) 05/26/2025   DTaP/Tdap/Td (3 - Td or Tdap) 10/23/2028   Hepatitis C Screening  Completed   HIV Screening  Completed   Zoster Vaccines- Shingrix  Completed   HPV VACCINES  Aged Out   Meningococcal B Vaccine  Aged Out   COVID-19 Vaccine  Discontinued    Health Maintenance  There are no preventive care reminders to display for this patient. Health Maintenance Items Addressed: See Nurse Notes  Additional Screening:  Vision Screening: Recommended annual ophthalmology exams for early detection of glaucoma and other disorders  of the eye.  Dental Screening: Recommended annual dental exams for proper oral hygiene  Community Resource Referral / Chronic Care Management: CRR required this visit?  No   CCM required this visit?  No   Plan:    I have personally reviewed and noted the following in the patient's chart:   Medical and social history Use of alcohol, tobacco or illicit drugs  Current medications and supplements including opioid prescriptions. Patient is not currently taking opioid prescriptions. Functional ability and status Nutritional status Physical activity Advanced directives List of other physicians Hospitalizations, surgeries, and ER visits in previous 12 months Vitals Screenings to include cognitive, depression, and falls Referrals and appointments  In addition, I have reviewed and discussed with patient certain preventive protocols, quality metrics, and best practice recommendations. A written personalized care plan for preventive services as well as general preventive health  recommendations were provided to patient.   Bruno Capri, LPN   1/61/0960   After Visit Summary: (MyChart) Due to this being a telephonic visit, the after visit summary with patients personalized plan was offered to patient via MyChart   Notes: Nothing significant to report at this time.

## 2023-07-26 ENCOUNTER — Ambulatory Visit: Attending: Vascular Surgery | Admitting: Vascular Surgery

## 2023-07-26 ENCOUNTER — Encounter: Payer: Self-pay | Admitting: Vascular Surgery

## 2023-07-26 VITALS — BP 141/73 | HR 69 | Temp 98.5°F | Ht 68.0 in | Wt 211.0 lb

## 2023-07-26 DIAGNOSIS — N186 End stage renal disease: Secondary | ICD-10-CM

## 2023-07-26 DIAGNOSIS — N2581 Secondary hyperparathyroidism of renal origin: Secondary | ICD-10-CM | POA: Diagnosis not present

## 2023-07-26 DIAGNOSIS — Z992 Dependence on renal dialysis: Secondary | ICD-10-CM | POA: Diagnosis not present

## 2023-07-26 NOTE — Progress Notes (Signed)
 Patient ID: Daniel Solomon., male   DOB: 29-Dec-1960, 63 y.o.   MRN: [id number]  Reason for Consult: New Patient (Initial Visit)   Referred by Charletta Cons, MD  Subjective:     HPI:  Daniel Solomon. is a 63 y.o. male history of end-stage renal disease currently on dialysis 4 days a week at home via right IJ tunneled dialysis catheter.  He has been on dialysis for approximately 3-1/2 years mostly via PD catheter which is scheduled [facility].  He is now here for discussion of permanent upper extremity dialysis access.  He is significantly right-hand dominant.  He denies upper extremity or chest or breast surgery in the past.  Past Medical History:  Diagnosis Date   Anemia    Anemia in chronic kidney disease 03/20/2020   Bicuspid aortic valve    Blood transfusion without reported diagnosis    CAD (coronary artery disease)    proximal LAD 50%, mid LAD 50-60%, mid to distal LAD 99%; mid circumflex 20%; proximal RCA 20%, distal RCA 50%; PDA 60-70%, proximal posterior AV groove 50%, proximal posterior lateral 50%.  There was a question of possible LVOT gradient;  s/p Resolute DES to mid-distal LAD 10/2010;  echocardiogram 11/17/10: EF 55-60%, mild LVH, grade 1 diastolic dysfunction, normal aortic valve, mild MR, PASP 32.    Chicken pox    Dysrhythmia    A. Fib   Enlarged aorta ([name])    ESRD on peritoneal dialysis ([name])    Glaucoma    Gout    allopurinol  300mg , colchicine  prn in past, has not had flares   Headache    History of kidney stones    HTN (hypertension) 12/06/2010   Lisinopril  20mg ,  amlodipine  5mg . Home cuff runs high- likely 20/10 higher at least.      HCTZ- stopped by Dr. Arlis Lakes     Hypercholesterolemia    With hypertriglyceridemia   Hypertension    Mild aortic stenosis    PAF (paroxysmal atrial fibrillation) ([name])    a. Documented on event monitor 01/2016.   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   SDH (subdural hematoma) ([name]) 08/2021   left   Sleep apnea    cpap nightly   Family History  Problem Relation Age of Onset   Hypertension Father    CAD Father        CABG 73s   Kidney disease Father        Stage IV   Valvular heart disease Mother        AVR July 2015   Diabetes Paternal Grandmother    Colon cancer Neg Hx    Past Surgical History:  Procedure Laterality Date   COLONOSCOPY  2018   CORONARY STENT PLACEMENT  02/28/2010   CRANIOPLASTY N/A 02/25/2022   Procedure: Cranioplasty with custom plate;  Surgeon: Audie Bleacher, MD;  Location: [facility] OR;  Service: Neurosurgery;  Laterality: N/A;   CRANIOPLASTY N/A 06/17/2022   Procedure: REVISION OF CRANIOPLASTY/PLACEMENT OF LUMBAR DRAIN;  Surgeon: Audie Bleacher, MD;  Location: [facility] OR;  Service: Neurosurgery;  Laterality: N/A;   CRANIOTOMY Left 10/05/2021   Procedure: CRANIOTOMY HEMATOMA EVACUATION SUBDURAL;  Surgeon: Audie Bleacher, MD;  Location: [facility] OR;  Service: Neurosurgery;  Laterality: Left;   CRANIOTOMY Left 10/07/2021   Procedure: Craniectomy for Recurrent  Intracranial Hematoma;  Surgeon: Audie Bleacher, MD;  [facility];  Service: Neurosurgery;  Laterality: Left;   CYSTOSCOPY/RETROGRADE/URETEROSCOPY  03/26/2012   Procedure: CYSTOSCOPY/RETROGRADE/URETEROSCOPY;  Surgeon: Alanson Alliance, MD;  Location: [facility];  Service: Urology;  Laterality: Bilateral;  CYSTOSCOPY BILATERAL RETROGRADE PYELOGRAM AND POSSIBLE URETEROSCOPY   DIALYSIS/PERMA CATHETER INSERTION Right 06/22/2023   Procedure: DIALYSIS/PERMA CATHETER INSERTION;  Surgeon: Patrick Boor, MD;  Location: [facility] INVASIVE CV LAB;  Service: Cardiovascular;  Laterality: Right;   HERNIA REPAIR  2021   Umbilical Repair   INTRAOPERATIVE TRANSTHORACIC ECHOCARDIOGRAM N/A 01/19/2021   Procedure: INTRAOPERATIVE TRANSTHORACIC ECHOCARDIOGRAM;  Surgeon: Odie Benne, MD;  Location: [facility] OR;  Service: Open Heart Surgery;  Laterality: N/A;   IR ANGIO EXTERNAL CAROTID SEL EXT CAROTID UNI L MOD SED  09/28/2021   IR ANGIO INTRA EXTRACRAN SEL INTERNAL CAROTID UNI L MOD SED  09/28/2021   IR ANGIOGRAM FOLLOW UP STUDY  09/28/2021   IR ANGIOGRAM FOLLOW UP STUDY  09/30/2021   IR NEURO EACH ADD'L AFTER BASIC UNI LEFT (MS)  09/28/2021   IR NEURO EACH ADD'L AFTER BASIC UNI RIGHT (MS)  09/30/2021   IR TRANSCATH/EMBOLIZ  09/28/2021   LEFT HEART CATH AND CORONARY ANGIOGRAPHY N/A 06/09/2023   Procedure: LEFT HEART CATH AND CORONARY ANGIOGRAPHY;  Surgeon: Odie Benne, MD;  Location: [facility] INVASIVE CV LAB;  Service: Cardiovascular;  Laterality: N/A;   PLACEMENT OF LUMBAR DRAIN N/A 06/17/2022   Procedure: PLACEMENT OF LUMBAR DRAIN;  Surgeon: Audie Bleacher, MD;  Location: [facility] OR;  Service: Neurosurgery;  Laterality: N/A;   RADIOLOGY WITH ANESTHESIA N/A 09/28/2021   Procedure: Middle meningeal artery embolization;  Surgeon: Augusto Blonder, MD;  Location: [facility] OR;  Service: Radiology;  Laterality: N/A;   RIGHT HEART CATH AND CORONARY ANGIOGRAPHY N/A 12/04/2020   Procedure: RIGHT HEART CATH AND CORONARY ANGIOGRAPHY;  Surgeon: Odie Benne, MD;  Location: [facility] INVASIVE CV LAB;  Service: Cardiovascular;  Laterality: N/A;   TRANSCATHETER AORTIC VALVE REPLACEMENT, TRANSFEMORAL N/A 01/19/2021   Procedure: TRANSCATHETER AORTIC VALVE REPLACEMENT, TRANSFEMORAL USING A EDWARDS VALVE.;  Surgeon: Odie Benne, MD;  Location: [facility] OR;  Service: Open Heart Surgery;  Laterality: N/A;    Short Social History:  Social History   Tobacco Use   Smoking status: Never   Smokeless tobacco: Never  Substance Use Topics   Alcohol use: No    No Known Allergies  Current Outpatient Medications  Medication Sig Dispense Refill   allopurinol  (ZYLOPRIM ) 300 MG tablet TAKE 1 TABLET BY MOUTH EVERY DAY 90 tablet 2   amoxicillin  (AMOXIL ) 500 MG tablet Take FOUR tablets  (2000 mg) by mouth ONE HOUR BEFORE ANY DENTAL APPOINTMENT (including cleanings) 12 tablet 3   aspirin  EC 81 MG tablet Take 1 tablet (81 mg total) by mouth daily. Swallow whole.     atorvastatin  (LIPITOR) 40 MG tablet TAKE 1 TABLET BY MOUTH EVERY DAY 90 tablet 2   AURYXIA  1 GM 210 MG(Fe) tablet Take 630 mg by mouth 3 (three) times daily with meals.     calcitRIOL  (ROCALTROL ) 0.25 MCG capsule Take 0.25 mcg by mouth. 3x a week per Dr. Jearldine Mina     cinacalcet (SENSIPAR) 60 MG tablet Take 60 mg by mouth daily.     gentamicin  cream (GARAMYCIN ) 0.1 % Apply 1 Application topically daily.     Methoxy PEG-Epoetin Beta (MIRCERA IJ) Inject into the skin as needed.     NON FORMULARY Pt uses a cpap nightly  Tenapanor  HCl, CKD, (XPHOZAH ) 30 MG TABS Take 30 mg by mouth daily.     No current facility-administered medications for this visit.    Review of Systems  Constitutional:  Constitutional negative. HENT: HENT negative.  Eyes: Eyes negative.  Respiratory: Respiratory negative.  Cardiovascular: Cardiovascular negative.  GI: Gastrointestinal negative.  Musculoskeletal: Musculoskeletal negative.  Skin: Skin negative.  Neurological: Neurological negative. Hematologic: Hematologic/lymphatic negative.  Psychiatric: Psychiatric negative.        Objective:  Objective   Vitals:   07/26/23 1458  BP: (!) 141/73  Pulse: 69  Temp: 98.5 F (36.9 C)  SpO2: 98%  Weight: 211 lb (95.7 kg)  Height: 5\' 8"  (1.727 m)   Body mass index is 32.08 kg/m.  Physical Exam HENT:     Head: Normocephalic.     Mouth/Throat:     Mouth: Mucous membranes are moist.  Eyes:     Pupils: Pupils are equal, round, and reactive to light.  Neck:     Comments: Right IJ TDC in place Cardiovascular:     Pulses:          Radial pulses are 2+ on the right side and 2+ on the left side.  Pulmonary:     Effort: Pulmonary effort is normal.  Abdominal:     General: Abdomen is flat.  Musculoskeletal:        General:  Normal range of motion.     Right lower leg: No edema.     Left lower leg: No edema.  Neurological:     Mental Status: He is alert.     Data: Right Pre-Dialysis Findings:  +-----------------------+----------+--------------------+---------+--------  +  Location              PSV (cm/s)Intralum. Diam. (cm)Waveform  Comments  +-----------------------+----------+--------------------+---------+--------  +  Brachial Antecub. [id number]        0.37                triphasic           +-----------------------+----------+--------------------+---------+--------  +  Radial Art at Wrist    54        0.16                triphasic           +-----------------------+----------+--------------------+---------+--------  +  Ulnar Art at Wrist     65        0.25                triphasic           +-----------------------+----------+--------------------+---------+--------  +        Left Pre-Dialysis Findings:  +-----------------------+----------+--------------------+---------+--------  +  Location              PSV (cm/s)Intralum. Diam. (cm)Waveform  Comments  +-----------------------+----------+--------------------+---------+--------  +  Brachial Antecub. fossa81        0.42                triphasic           +-----------------------+----------+--------------------+---------+--------  +  Radial Art at Wrist    74        0.19                triphasic           +-----------------------+----------+--------------------+---------+--------  +  Ulnar Art at Wrist     99        0.24  triphasic           +-----------------------+----------+--------------------+---------+--------  +        Summary:    Right: Patent brachial, radial, and ulnar arteries.  Left: Patent brachial, radial, and ulnar arteries.  *See table(s) above for measurements and observations.   Right Cephalic   Diameter (cm)Depth (cm)  Findings     +-----------------+-------------+----------+------------+  Prox upper arm       0.33                             +-----------------+-------------+----------+------------+  Mid upper arm        0.28                             +-----------------+-------------+----------+------------+  Dist upper arm       0.33                             +-----------------+-------------+----------+------------+  Antecubital fossa    0.47                             +-----------------+-------------+----------+------------+  Prox forearm         0.19               branchg 0.18  +-----------------+-------------+----------+------------+  Mid forearm          0.22                             +-----------------+-------------+----------+------------+  Dist forearm         0.25               branch 0.19   +-----------------+-------------+----------+------------+   +--------------+-------------+----------+--------+  Right Basilic Diameter (cm)Depth (cm)Findings  +--------------+-------------+----------+--------+  Prox upper arm    0.39                         +--------------+-------------+----------+--------+  Mid upper arm     0.34                         +--------------+-------------+----------+--------+  Dist upper arm    0.24                         +--------------+-------------+----------+--------+   +-----------------+-------------+----------+--------+  Left Cephalic    Diameter (cm)Depth (cm)Findings  +-----------------+-------------+----------+--------+  Prox upper arm       0.38                         +-----------------+-------------+----------+--------+  Mid upper arm        0.27                         +-----------------+-------------+----------+--------+  Dist upper arm       0.23                         +-----------------+-------------+----------+--------+  Antecubital fossa    0.31                          +-----------------+-------------+----------+--------+  Prox forearm  0.15                         +-----------------+-------------+----------+--------+  Mid forearm          0.20                         +-----------------+-------------+----------+--------+  Dist forearm         0.16                         +-----------------+-------------+----------+--------+   +--------------+-------------+----------+--------+  Left Basilic  Diameter (cm)Depth (cm)Findings  +--------------+-------------+----------+--------+  Prox upper arm    0.45                         +--------------+-------------+----------+--------+  Mid upper arm     0.30                         +--------------+-------------+----------+--------+  Dist upper arm    0.17                         +--------------+-------------+----------+--------+   Summary: Right: Patent cephalic and basilic veins.  Left: Patent cephalic and basilic veins.   *See table(s) above for measurements and observations.          Assessment/Plan:    63 year old male with end-stage renal disease currently dialyzing via catheter.  We have discussed his permanent options being catheter versus graft versus fistula with fistula and graft being preferred.  Patient is significantly right-hand-dominant has marginal vein in his bilateral upper extremities without any evidence of surface vein by physical exam.  As such we will plan for left upper arm AV fistula versus more likely graft on a nondialysis day in the near future.  We have discussed the risk benefits and alternatives and he demonstrates good understanding.     Adine Hoof MD [facility]

## 2023-07-26 NOTE — H&P (View-Only) (Signed)
 Patient ID: Daniel Solomon., male   DOB: 29-Dec-1960, 63 y.o.   MRN: [id number]  Reason for Consult: New Patient (Initial Visit)   Referred by Charletta Cons, MD  Subjective:     HPI:  Daniel Solomon. is a 63 y.o. male history of end-stage renal disease currently on dialysis 4 days a week at home via right IJ tunneled dialysis catheter.  He has been on dialysis for approximately 3-1/2 years mostly via PD catheter which is scheduled to [facility] next [facility].  He is now here for discussion of permanent upper extremity dialysis access.  He is significantly right-hand dominant.  He denies upper extremity or chest or breast surgery in the past.  Past Medical History:  Diagnosis Date   Anemia    Anemia in chronic kidney disease 03/20/2020   Bicuspid aortic valve    Blood transfusion without reported diagnosis    CAD (coronary artery disease)    proximal LAD 50%, mid LAD 50-60%, mid to distal LAD 99%; mid circumflex 20%; proximal RCA 20%, distal RCA 50%; PDA 60-70%, proximal posterior AV groove 50%, proximal posterior lateral 50%.  There was a question of possible LVOT gradient;  s/p Resolute DES to mid-distal LAD 10/2010;  echocardiogram 11/17/10: EF 55-60%, mild LVH, grade 1 diastolic dysfunction, normal aortic valve, mild MR, PASP 32.    Chicken pox    Dysrhythmia    A. Fib   Enlarged aorta ([name])    ESRD on peritoneal dialysis ([name])    Glaucoma    Gout    allopurinol  300mg , colchicine  prn in past, has not had flares   Headache    History of kidney stones    HTN (hypertension) 12/06/2010   Lisinopril  20mg ,  amlodipine  5mg . Home cuff runs high- likely 20/10 higher at least.      HCTZ- stopped by Dr. Arlis Lakes     Hypercholesterolemia    With hypertriglyceridemia   Hypertension    Mild aortic stenosis    PAF (paroxysmal atrial fibrillation) ([name])    a. Documented on event monitor 01/2016.   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   SDH (subdural hematoma) ([name]) 08/2021   left   Sleep apnea    cpap nightly   Family History  Problem Relation Age of Onset   Hypertension Father    CAD Father        CABG 73s   Kidney disease Father        Stage IV   Valvular heart disease Mother        AVR July 2015   Diabetes Paternal Grandmother    Colon cancer Neg Hx    Past Surgical History:  Procedure Laterality Date   COLONOSCOPY  2018   CORONARY STENT PLACEMENT  02/28/2010   CRANIOPLASTY N/A 02/25/2022   Procedure: Cranioplasty with custom plate;  Surgeon: Audie Bleacher, MD;  Location: [facility] OR;  Service: Neurosurgery;  Laterality: N/A;   CRANIOPLASTY N/A 06/17/2022   Procedure: REVISION OF CRANIOPLASTY/PLACEMENT OF LUMBAR DRAIN;  Surgeon: Audie Bleacher, MD;  Location: [facility] OR;  Service: Neurosurgery;  Laterality: N/A;   CRANIOTOMY Left 10/05/2021   Procedure: CRANIOTOMY HEMATOMA EVACUATION SUBDURAL;  Surgeon: Audie Bleacher, MD;  Location: [facility] OR;  Service: Neurosurgery;  Laterality: Left;   CRANIOTOMY Left 10/07/2021   Procedure: Craniectomy for Recurrent  Intracranial Hematoma;  Surgeon: Audie Bleacher, MD;  [facility];  Service: Neurosurgery;  Laterality: Left;   CYSTOSCOPY/RETROGRADE/URETEROSCOPY  03/26/2012   Procedure: CYSTOSCOPY/RETROGRADE/URETEROSCOPY;  Surgeon: Alanson Alliance, MD;  Location: [facility];  Service: Urology;  Laterality: Bilateral;  CYSTOSCOPY BILATERAL RETROGRADE PYELOGRAM AND POSSIBLE URETEROSCOPY   DIALYSIS/PERMA CATHETER INSERTION Right 06/22/2023   Procedure: DIALYSIS/PERMA CATHETER INSERTION;  Surgeon: Patrick Boor, MD;  Location: [facility] INVASIVE CV LAB;  Service: Cardiovascular;  Laterality: Right;   HERNIA REPAIR  2021   Umbilical Repair   INTRAOPERATIVE TRANSTHORACIC ECHOCARDIOGRAM N/A 01/19/2021   Procedure: INTRAOPERATIVE TRANSTHORACIC ECHOCARDIOGRAM;  Surgeon: Odie Benne, MD;  Location: [facility] OR;  Service: Open Heart Surgery;  Laterality: N/A;   IR ANGIO EXTERNAL CAROTID SEL EXT CAROTID UNI L MOD SED  09/28/2021   IR ANGIO INTRA EXTRACRAN SEL INTERNAL CAROTID UNI L MOD SED  09/28/2021   IR ANGIOGRAM FOLLOW UP STUDY  09/28/2021   IR ANGIOGRAM FOLLOW UP STUDY  09/30/2021   IR NEURO EACH ADD'L AFTER BASIC UNI LEFT (MS)  09/28/2021   IR NEURO EACH ADD'L AFTER BASIC UNI RIGHT (MS)  09/30/2021   IR TRANSCATH/EMBOLIZ  09/28/2021   LEFT HEART CATH AND CORONARY ANGIOGRAPHY N/A 06/09/2023   Procedure: LEFT HEART CATH AND CORONARY ANGIOGRAPHY;  Surgeon: Odie Benne, MD;  Location: [facility] INVASIVE CV LAB;  Service: Cardiovascular;  Laterality: N/A;   PLACEMENT OF LUMBAR DRAIN N/A 06/17/2022   Procedure: PLACEMENT OF LUMBAR DRAIN;  Surgeon: Audie Bleacher, MD;  Location: [facility] OR;  Service: Neurosurgery;  Laterality: N/A;   RADIOLOGY WITH ANESTHESIA N/A 09/28/2021   Procedure: Middle meningeal artery embolization;  Surgeon: Augusto Blonder, MD;  Location: [facility] OR;  Service: Radiology;  Laterality: N/A;   RIGHT HEART CATH AND CORONARY ANGIOGRAPHY N/A 12/04/2020   Procedure: RIGHT HEART CATH AND CORONARY ANGIOGRAPHY;  Surgeon: Odie Benne, MD;  Location: [facility] INVASIVE CV LAB;  Service: Cardiovascular;  Laterality: N/A;   TRANSCATHETER AORTIC VALVE REPLACEMENT, TRANSFEMORAL N/A 01/19/2021   Procedure: TRANSCATHETER AORTIC VALVE REPLACEMENT, TRANSFEMORAL USING A EDWARDS VALVE.;  Surgeon: Odie Benne, MD;  Location: [facility] OR;  Service: Open Heart Surgery;  Laterality: N/A;    Short Social History:  Social History   Tobacco Use   Smoking status: Never   Smokeless tobacco: Never  Substance Use Topics   Alcohol use: No    No Known Allergies  Current Outpatient Medications  Medication Sig Dispense Refill   allopurinol  (ZYLOPRIM ) 300 MG tablet TAKE 1 TABLET BY MOUTH EVERY DAY 90 tablet 2   amoxicillin  (AMOXIL ) 500 MG tablet Take FOUR tablets  (2000 mg) by mouth ONE HOUR BEFORE ANY DENTAL APPOINTMENT (including cleanings) 12 tablet 3   aspirin  EC 81 MG tablet Take 1 tablet (81 mg total) by mouth daily. Swallow whole.     atorvastatin  (LIPITOR) 40 MG tablet TAKE 1 TABLET BY MOUTH EVERY DAY 90 tablet 2   AURYXIA  1 GM 210 MG(Fe) tablet Take 630 mg by mouth 3 (three) times daily with meals.     calcitRIOL  (ROCALTROL ) 0.25 MCG capsule Take 0.25 mcg by mouth. 3x a week per Dr. Jearldine Mina     cinacalcet (SENSIPAR) 60 MG tablet Take 60 mg by mouth daily.     gentamicin  cream (GARAMYCIN ) 0.1 % Apply 1 Application topically daily.     Methoxy PEG-Epoetin Beta (MIRCERA IJ) Inject into the skin as needed.     NON FORMULARY Pt uses a cpap nightly  Tenapanor  HCl, CKD, (XPHOZAH ) 30 MG TABS Take 30 mg by mouth daily.     No current facility-administered medications for this visit.    Review of Systems  Constitutional:  Constitutional negative. HENT: HENT negative.  Eyes: Eyes negative.  Respiratory: Respiratory negative.  Cardiovascular: Cardiovascular negative.  GI: Gastrointestinal negative.  Musculoskeletal: Musculoskeletal negative.  Skin: Skin negative.  Neurological: Neurological negative. Hematologic: Hematologic/lymphatic negative.  Psychiatric: Psychiatric negative.        Objective:  Objective   Vitals:   07/26/23 1458  BP: (!) 141/73  Pulse: 69  Temp: 98.5 F (36.9 C)  SpO2: 98%  Weight: 211 lb (95.7 kg)  Height: 5\' 8"  (1.727 m)   Body mass index is 32.08 kg/m.  Physical Exam HENT:     Head: Normocephalic.     Mouth/Throat:     Mouth: Mucous membranes are moist.  Eyes:     Pupils: Pupils are equal, round, and reactive to light.  Neck:     Comments: Right IJ TDC in place Cardiovascular:     Pulses:          Radial pulses are 2+ on the right side and 2+ on the left side.  Pulmonary:     Effort: Pulmonary effort is normal.  Abdominal:     General: Abdomen is flat.  Musculoskeletal:        General:  Normal range of motion.     Right lower leg: No edema.     Left lower leg: No edema.  Neurological:     Mental Status: He is alert.     Data: Right Pre-Dialysis Findings:  +-----------------------+----------+--------------------+---------+--------  +  Location              PSV (cm/s)Intralum. Diam. (cm)Waveform  Comments  +-----------------------+----------+--------------------+---------+--------  +  Brachial Antecub. [id number]        0.37                triphasic           +-----------------------+----------+--------------------+---------+--------  +  Radial Art at Wrist    54        0.16                triphasic           +-----------------------+----------+--------------------+---------+--------  +  Ulnar Art at Wrist     65        0.25                triphasic           +-----------------------+----------+--------------------+---------+--------  +        Left Pre-Dialysis Findings:  +-----------------------+----------+--------------------+---------+--------  +  Location              PSV (cm/s)Intralum. Diam. (cm)Waveform  Comments  +-----------------------+----------+--------------------+---------+--------  +  Brachial Antecub. fossa81        0.42                triphasic           +-----------------------+----------+--------------------+---------+--------  +  Radial Art at Wrist    74        0.19                triphasic           +-----------------------+----------+--------------------+---------+--------  +  Ulnar Art at Wrist     99        0.24  triphasic           +-----------------------+----------+--------------------+---------+--------  +        Summary:    Right: Patent brachial, radial, and ulnar arteries.  Left: Patent brachial, radial, and ulnar arteries.  *See table(s) above for measurements and observations.   Right Cephalic   Diameter (cm)Depth (cm)  Findings     +-----------------+-------------+----------+------------+  Prox upper arm       0.33                             +-----------------+-------------+----------+------------+  Mid upper arm        0.28                             +-----------------+-------------+----------+------------+  Dist upper arm       0.33                             +-----------------+-------------+----------+------------+  Antecubital fossa    0.47                             +-----------------+-------------+----------+------------+  Prox forearm         0.19               branchg 0.18  +-----------------+-------------+----------+------------+  Mid forearm          0.22                             +-----------------+-------------+----------+------------+  Dist forearm         0.25               branch 0.19   +-----------------+-------------+----------+------------+   +--------------+-------------+----------+--------+  Right Basilic Diameter (cm)Depth (cm)Findings  +--------------+-------------+----------+--------+  Prox upper arm    0.39                         +--------------+-------------+----------+--------+  Mid upper arm     0.34                         +--------------+-------------+----------+--------+  Dist upper arm    0.24                         +--------------+-------------+----------+--------+   +-----------------+-------------+----------+--------+  Left Cephalic    Diameter (cm)Depth (cm)Findings  +-----------------+-------------+----------+--------+  Prox upper arm       0.38                         +-----------------+-------------+----------+--------+  Mid upper arm        0.27                         +-----------------+-------------+----------+--------+  Dist upper arm       0.23                         +-----------------+-------------+----------+--------+  Antecubital fossa    0.31                          +-----------------+-------------+----------+--------+  Prox forearm  0.15                         +-----------------+-------------+----------+--------+  Mid forearm          0.20                         +-----------------+-------------+----------+--------+  Dist forearm         0.16                         +-----------------+-------------+----------+--------+   +--------------+-------------+----------+--------+  Left Basilic  Diameter (cm)Depth (cm)Findings  +--------------+-------------+----------+--------+  Prox upper arm    0.45                         +--------------+-------------+----------+--------+  Mid upper arm     0.30                         +--------------+-------------+----------+--------+  Dist upper arm    0.17                         +--------------+-------------+----------+--------+   Summary: Right: Patent cephalic and basilic veins.  Left: Patent cephalic and basilic veins.   *See table(s) above for measurements and observations.          Assessment/Plan:    63 year old male with end-stage renal disease currently dialyzing via catheter.  We have discussed his permanent options being catheter versus graft versus fistula with fistula and graft being preferred.  Patient is significantly right-hand-dominant has marginal vein in his bilateral upper extremities without any evidence of surface vein by physical exam.  As such we will plan for left upper arm AV fistula versus more likely graft on a nondialysis day in the near future.  We have discussed the risk benefits and alternatives and he demonstrates good understanding.     Adine Hoof MD [facility]

## 2023-07-27 ENCOUNTER — Ambulatory Visit (INDEPENDENT_AMBULATORY_CARE_PROVIDER_SITE_OTHER): Admitting: Family Medicine

## 2023-07-27 ENCOUNTER — Encounter: Payer: Self-pay | Admitting: Family Medicine

## 2023-07-27 VITALS — BP 110/60 | HR 50 | Temp 97.7°F | Ht 68.0 in | Wt 208.0 lb

## 2023-07-27 DIAGNOSIS — M79604 Pain in right leg: Secondary | ICD-10-CM

## 2023-07-27 DIAGNOSIS — M79605 Pain in left leg: Secondary | ICD-10-CM

## 2023-07-27 NOTE — Addendum Note (Signed)
 Addended by: Almira Jaeger on: 07/27/2023 05:48 PM   Modules accepted: Level of Service

## 2023-07-27 NOTE — Progress Notes (Signed)
 Phone [phone] In person visit   Subjective:   Daniel Solomon. is a 63 y.o. year old very pleasant male patient who presents for/with See problem oriented charting Chief Complaint  Patient presents with   Leg Pain    Pt c/o bi lateral leg pain that started 2 weeks ago that comes and goes that feels like an ache.   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 03/20/2020    Priority: High   Dilated aortic root ([name]) 12/14/2017    Priority: High   Paroxysmal atrial fibrillation ([name]) 03/02/2016    Priority: High   Branch retinal artery occlusion 03/01/2016    Priority: High   CAD (coronary artery disease) 12/06/2010    Priority: High   Anemia in chronic kidney disease 03/20/2020    Priority: Medium    Secondary hyperparathyroidism of renal origin ([name]) 03/20/2020    Priority: Medium    Gout     Priority: Medium    OSA (obstructive sleep apnea) 12/06/2010    Priority: Medium    Mixed hyperlipidemia 12/06/2010    Priority: Medium    Bradycardia 12/06/2010    Priority: Medium    History of urinary stone 03/20/2020    Priority: Low   Carotid bruit 02/09/2012    Priority: Low   Skull defect 06/17/2022   Postoperative CSF leak 06/17/2022   Status post craniectomy 02/25/2022   S/P craniotomy 10/05/2021   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 Swallow whole.     atorvastatin  (LIPITOR) 40 MG tablet TAKE 1 TABLET BY  MOUTH EVERY DAY 90 tablet 2   AURYXIA  1 GM 210 MG(Fe) tablet Take 630 mg by mouth 3 (three) times daily with meals.     calcitRIOL  (ROCALTROL ) 0.25 MCG capsule Take 0.25 mcg by mouth. 3x a week per Dr. Jearldine Mina     cinacalcet (SENSIPAR) 60 MG tablet Take 60 mg by mouth daily.     gentamicin  cream (GARAMYCIN ) 0.1 % Apply 1 Application topically daily.     Methoxy PEG-Epoetin Beta (MIRCERA IJ) Inject into the skin as needed.     NON FORMULARY Pt uses a cpap nightly     Tenapanor  HCl, CKD, (XPHOZAH ) 30 MG TABS Take 30 mg by mouth daily.     amoxicillin  (AMOXIL ) 500 MG tablet Take FOUR tablets (2000 mg) by mouth ONE HOUR BEFORE ANY DENTAL APPOINTMENT (including cleanings) (Patient not taking: Reported on 07/27/2023) 12 tablet 3   No current facility-administered medications for this visit.     Objective:  BP 110/60   Pulse (!) 50   Temp 97.7 F (36.5 C)   Ht 5\' 8"  (1.727 m)   Wt 208 lb (94.3 kg)   SpO2 98%   BMI 31.63 kg/m  Gen: NAD, resting comfortably CV: RRR no murmurs rubs or gallops Lungs: CTAB no crackles, wheeze, rhonchi Ext: minimal edema Skin: warm, dry  Assessment and Plan   # Bilaterally achy leg pain  S:started 2 weeks ago. Pain 3-4/10 even at rest- worse with walking, better with laying down and propping feet up. The worst is standing up for prolonged period- feels it in his shins in particular. Getting into a car on passengers side can be very painful or going up stairs unless very careful. No back pain - no numbness/tingling in legs but some in feet but not new in last 2 weeks.  - no fall or injury -no prior back imaging - also separately hurts in lateral portion of the knees  A/P: Patient with bilateral achy leg pain primarily in the shins and worse with standing without back pain.  Consider something like spinal stenosis but with no low back pain at this time we opted to monitor by coming off of statin and seeing how he does short-term-if no improvement within a  few weeks after the let me know and we could consider back imaging -excellent 2+ PT and DP pulses so doubt claudication related and not primarily with movement but more standing - Also tender along right medial joint line and minimally along lateral joint line bilaterally.  If aching in his legs he may be compensating and putting more stress on the knee joint-want to see how that does as well with holding off on medicine atorvastatin  -No calf pain or tenderness or swelling to suggest DVT/PE  # Nausea S:noted nausea since Saturday. Now on hemodialysis and had done well until last Saturday. Threw up and felt better. Fine Sunday and Monday and Tuesday had first hemodialysis at home and that night threw up twice and once today. Feels better now.  - plan is MWFSa biut this week with holiday was tu, wed, Friday, Sunday. Over weekend went three days. Felt similarly with peritioneal dialysis when it was no longer working  -has Zofran  from Dr. Jearldine Mina- mild help but didn't resolve issue -antacids help sometimes A/P: I wonder if adjusting from peritoneal dialysis to then hemodialysis and now home hemodialysis is contributing to nausea-seems to feel better at this point.  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 Annamaria Barrette with [facility] -As above I think he is adjusting to home hemodialysis-interested to see how his labs look next week-he agrees to have them send me a copy-no changes for now    # Paroxysmal atrial fibrillation S: Rate controlled with no  medication Anticoagulated with no medication-patient declines with prior subdural hematoma from 09/22/2021 but he is on aspirin  -Watchman has been considered-patient is thinking this over  A/P: Bradycardic even without medication-continue to monitor.  Not on anticoagulation per his chart preference-continue to monitor-aspirin  can be continued particular with CAD history   # CAD # Hyperlipidemia # Aortic stenosis status post TAVR  S:Medication: Atorvastatin  40 mg, off of aspirin  or anticoagulant due to prior subdural hematoma per his strong preference A/P: CAD appears asymptomatic-his nausea episodes are not exertional  #Gout S: Medication: Allopurinol  300 mg daily Lab Results  Component Value Date   LABURIC 5.2 10/24/2018  A/P: Will transition  to hemodialysis may need to be on the lower dose of allopurinol -we discussed slow titration to only after dialysis 4 days a week instead of 7 days a week at present-wonder if allopurinol  at excess levels could contribute to nausea as well  Recommended follow up: Return in about 6 months (around 01/27/2024) for physical or sooner if needed.Schedule b4 you leave. Future Appointments  Date Time Provider Department Center  01/30/2024  3:20 PM Almira Jaeger, MD [facility]  07/30/2024  8:00 AM [facility] ANNUAL WELLNESS VISIT 1 [facility]    Lab/Order associations: No diagnosis found.  No orders of the defined types were placed in this encounter.   Return precautions advised.  Clarisa Crooked, MD

## 2023-07-27 NOTE — Patient Instructions (Addendum)
 Hold atorvastatin  for 2-4 weeks and update me at that point- hoping helps leg pain -if knee pain not better may consider sports medicine or orthopedic consult - if achiness not better may add CK to your labs  Lets gradually go down to allopurinol  after dialysis only- first two wseek only cut 1 dose a week, then for 2 weeks cut 2 doses and then finally cut 3rd dose- as long as no flares  Have them send me a copy of the bloodwork next week please  Recommended follow up: Return in about 6 months (around 01/27/2024) for physical or sooner if needed.Schedule b4 you leave.  Prior

## 2023-07-28 ENCOUNTER — Encounter (HOSPITAL_COMMUNITY): Payer: Self-pay | Admitting: Vascular Surgery

## 2023-07-28 ENCOUNTER — Other Ambulatory Visit: Payer: Self-pay

## 2023-07-28 DIAGNOSIS — N186 End stage renal disease: Secondary | ICD-10-CM

## 2023-07-28 NOTE — Progress Notes (Signed)
 Anesthesia Chart Review: SAME DAY WORK-UP  Case: [id number] Date/Time: 08/01/23 0926   Procedures:      ARTERIOVENOUS (AV) FISTULA CREATION (Left)     INSERTION, GRAFT, ARTERIOVENOUS, UPPER EXTREMITY (Left)   Anesthesia type: Choice   Diagnosis: ESRD (end stage renal disease) ([name]) [N18.6]   Pre-op diagnosis: esrd   [facility] RO[facility] / [facility]   Surgeons: Adine Hoof, MD       DISCUSSION: Patient is a 63 year old male scheduled for the above procedure. He recently transitioned from PD to HD.    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 02/09/23 echo), afib (diagnosed 2017), ESRD (d/t FSGS; PD catheter placed 03/06/20, scheduled for removal 08/07/23 due to malfunction; right internal jugular Palindrome [facility] 06/22/23 & transitioned to HD), anemia, OSA (uses CPAP), glaucoma, right eye retinal artery occlusion (2017), gout, renal cysts (03/26/21 MRI), left SDH (09/22/21; s/p Onyx embolization of left middle meningeal artery 09/28/21 with acute/subacute SDH 10/05/21 requiring craniotomy for left SDH evacuation and craniectomy for right epidural/SDH 10/07/21; cranioplasty with custom plate 45/40/98; revision cranioplasty, placement of lumbar drain for CSF leak 06/17/22). Seizure like activity while remaining lucid 04/21/23 during home PD, felt secondary to hypocalcemia.    Last cardiology office visit with Dr. Abel Hoe was on 06/05/23. Afib rate controlled. Previously not on b-blocker due to bradycardia. Off Eliquis  due to SDH requiring craniotomy. 02/09/23 TTE showed LVEF 55-60%, well seated TAVR with trace paravalvular regurgitation, 44 mm ascending aorta. No chest pain, but 02/09/23 nuclear stress test at [facility] (as part of transplant evaluation) suggested anterior ischemia.  A LHC was done on 06/09/23 which showed stable CAD (30% OM1, 50% prox-mid LAD),  20% & 50% distal LAD, 40% prox-mid CX, 70% RCA), so continued medical therapy was recommended. Transplant nephrologist subsequently requested an ETT which was done on 07/12/23 and showed no ECG evidence of ischemia with exercise > 4 METS. One year office follow-up planned.   He is for labs on arrival. Anesthesia team to evaluate on the day of surgery.  VS:  Wt Readings from Last 3 Encounters:  07/28/23 94.3 kg  07/27/23 94.3 kg  07/26/23 95.7 kg   BP Readings from Last 3 Encounters:  07/27/23 110/60  07/26/23 (!) 141/73  06/22/23 115/77   Pulse Readings from Last 3 Encounters:  07/27/23 (!) 50  07/26/23 69  06/22/23 71     PROVIDERS: Almira Jaeger, MD is PCP  - Antoinette Batman, MD is structural heart cardiologist - Suella Emmer, MD is pulmonologist. Initial consult 04/05/23 for ground glass pulmonology nodules, OSA, pretransplant evaluation. He wrote, "...based on clinical picture cleared from a pulmonary standpoint to undergo transplant." Six month follow-up planned. Fawn Hooks, MD is nephrologist is with [facility]. He is undergoing Renal Transplant evaluation at [facility]. Audie Bleacher, MD is neurosurgeon   LABS: For day of surgery. As of 06/05/23, H/H 12.1/36.2, PLT 184, glucose 110.   OTHER: PFTs 02/09/23 ([facility]): FVC 4.27 (117%). FEV1 3.55  (123.6%). DLCO unc 18.1 (74.8%).  Quality of Spirometry  Grade AA  Spirometry  Normal.   Lung Volumes  Normal.   Diffusing Capacity  Normal.   Flow-Volume Loop  Flow-volume loop suggests variable extra thoracic obstruction.   Summary  Normal spirometry. Normal lung volumes. Normal  diffusing capacity. Flow-volume loop suggests variable extra thoracic obstruction. Clinical  correlation advised.     SIX-MINUTE WALK TEST 02/09/23 ([facility]): Patient was able to walk 449 meters  1481.7 feet .  This is 95 % predicted.  Dyspnea on the BORG scale was "light".  Pretest pulse rate was 83 and pulse oximetry  was 100%.  Posttest pulse rate was 85 and pulse oximetry was 100%. The results of the six minute walk test indicate normal exercise capacity.   Home Sleep Study 12/17/10: IMPRESSIONS: 1.  Severe obstructive sleep apnea/hypopnea syndrome with AHI 45/h and oxygen desaturation as low as 77%. 2.  Bradycardia noted throughout.   IMAGES: MRI Abd 04/24/23 ([facility]): IMPRESSION: Findings compatible with a hemorrhagic/proteinaceous left renal cyst, within sensitivity of a noncontrast exam.   CT Head 04/21/23: IMPRESSION: 1. No acute intracranial abnormality or significant interval change. 2. Previous left craniectomy and cranioplasty. 3. Progressive thickening and calcification of the dura. 4. Increased CSF density extradural collection on the left. No significant extracranial fluid collection is present as has been seen in the past. This does not create any mass effect. Consider short-term follow-up CT to assess stability of this collection in 2-3 months unless the patient has additional seizures or other symptoms.   1V PCXR 04/21/23: FINDINGS: Normal lung volumes. Bibasilar patchy opacities. No pleural effusion or pneumothorax. Similar cardiomediastinal silhouette status post aortic valve replacement. No radiographic finding of acute displaced fracture. IMPRESSION: Bibasilar patchy opacities, which may reflect atelectasis, aspiration, or pneumonia.  CT Chest 03/14/23 ([facility]): IMPRESSION: 1.  No acute findings in the chest.  2.  Ground glass nodule in the right middle lobe, potentially infectious or inflammatory.  3.  Additional scattered calcified granulomata and sub-6 mm pulmonary micronodules. No new concerning nodules.     EKG: 06/05/23: Atrial fibrillation at 90 bpm Nonspecific ST abnormality Confirmed by Antoinette Batman [phone]) on 06/05/2023 8:58:09 AM    CV: ETT 07/12/23:   Baseline ECG with atrial fibrillation under good rate control.   Patient was able to  exercise greater than 4 METS of activity (4.8 METS) with no anginal symptoms.  Low risk overall exercise treadmill test with no electrocardiographic evidence of ischemia.  No ST segment deviation.  Recent cardiac catheterization reviewed demonstrating no obstructive coronary artery disease.    Cardiac cath 06/09/23:   1st Mrg lesion is 30% stenosed.   Prox LAD to Mid LAD lesion is 50% stenosed.   Mid LAD lesion is 50% stenosed.   Dist LAD-1 lesion is 20% stenosed.   Dist LAD-2 lesion is 50% stenosed.   Prox Cx to Mid Cx lesion is 40% stenosed.   Dist RCA lesion is 70% stenosed.   Stable CAD without change since cath in 2022 The LAD has moderate diffuse disease in the proximal, mid and distal vessel. Patent mid to distal vessel stented segment with minimal restenosis Large caliber Circumflex artery with minimal plaque Large dominant RCA with moderate disease at the bifurcation of the moderate caliber posterolateral artery and small caliber PDA. Unchanged from last cath in 2022.  Normal LV filling pressure   Recommendations: Medical management of CAD. He can be listed on the renal transplant list from a cardiac perspective.    Echo 02/09/23 ([facility]): SUMMARY  The left ventricular size is normal.  Mild left ventricular hypertrophy.  Left ventricular systolic function is normal.  LV ejection fraction = 55-60%.  The right ventricle is normal in size and function.  The left atrium is moderately to severely dilated.  Status post TAVR appears well seated  with trace paravalvular regurgitation,  The aortic sinus is normal size.  Mildly dilated ascending aorta  ~4.4 cm .  The IVC is normal in size with an inspiratory collapse of greater than 50%, suggesting normal right  atrial pressure.  There is no pericardial effusion.  Compared with prior TTE, s-p TAVR      US  Carotid 06/08/21 ([facility]): Not viewable in Care Everywhere. Last available results are from 01/28/16: 1 to 39% bilateral  ICA stenosis, normal subclavian arteries bilaterally, patent bilateral vertebral arteries with antegrade flow, with abnormal waveforms in the right vertebral artery.      Cardiac event monitor 01/28/16: Sinus rhythm with premature atrial contractions Atrial fibrillation with controlled heart rate   Past Medical History:  Diagnosis Date   Anemia    Anemia in chronic kidney disease 03/20/2020   Bicuspid aortic valve    Blood transfusion without reported diagnosis    CAD (coronary artery disease)    proximal LAD 50%, mid LAD 50-60%, mid to distal LAD 99%; mid circumflex 20%; proximal RCA 20%, distal RCA 50%; PDA 60-70%, proximal posterior AV groove 50%, proximal posterior lateral 50%.  There was a question of possible LVOT gradient;  s/p Resolute DES to mid-distal LAD 10/2010;  echocardiogram 11/17/10: EF 55-60%, mild LVH, grade 1 diastolic dysfunction, normal aortic valve, mild MR, PASP 32.    Chicken pox    Dysrhythmia    A. Fib   Enlarged aorta ([name])    ESRD on peritoneal dialysis ([name])    Glaucoma    Gout    allopurinol  300mg , colchicine  prn in past, has not had flares   Headache    History of kidney stones    HTN (hypertension) 12/06/2010   Lisinopril  20mg ,  amlodipine  5mg . Home cuff runs high- likely 20/10 higher at least.      HCTZ- stopped by Dr. Arlis Lakes     Hypercholesterolemia    With hypertriglyceridemia   Hypertension    Mild aortic stenosis    PAF (paroxysmal atrial fibrillation) ([name])    a. Documented on event monitor 01/2016.   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   SDH (subdural hematoma) ([name]) 08/2021   left   Sleep apnea    cpap nightly    Past Surgical History:  Procedure Laterality Date   COLONOSCOPY  2018   CORONARY STENT PLACEMENT  02/28/2010   CRANIOPLASTY N/A 02/25/2022   Procedure: Cranioplasty with custom  plate;  Surgeon: Audie Bleacher, MD;  [facility]: [facility];  Service: Neurosurgery;  Laterality: N/A;   CRANIOPLASTY N/A 06/17/2022   Procedure: REVISION OF CRANIOPLASTY/PLACEMENT OF LUMBAR DRAIN;  Surgeon: Audie Bleacher, MD;  [facility];  Service: Neurosurgery;  Laterality: N/A;   CRANIOTOMY Left 10/05/2021   Procedure: CRANIOTOMY HEMATOMA EVACUATION SUBDURAL;  Surgeon: Audie Bleacher, MD;  Location: [facility];  Service: Neurosurgery;  Laterality: Left;   CRANIOTOMY Left 10/07/2021   Procedure: Craniectomy for Recurrent  Intracranial Hematoma;  Surgeon: Audie Bleacher, MD;  Location: [facility] OR;  Service: Neurosurgery;  Laterality: Left;   CYSTOSCOPY/RETROGRADE/URETEROSCOPY  03/26/2012   Procedure: CYSTOSCOPY/RETROGRADE/URETEROSCOPY;  Surgeon: Alanson Alliance, MD;  Location: [facility];  Service: Urology;  Laterality: Bilateral;  CYSTOSCOPY BILATERAL RETROGRADE PYELOGRAM AND POSSIBLE URETEROSCOPY   DIALYSIS/PERMA CATHETER INSERTION Right 06/22/2023   Procedure: DIALYSIS/PERMA CATHETER INSERTION;  Surgeon: Patrick Boor, MD;  [facility];  Service: Cardiovascular;  Laterality: Right;   HERNIA REPAIR  2021   Umbilical Repair   INTRAOPERATIVE TRANSTHORACIC ECHOCARDIOGRAM N/A 01/19/2021   Procedure: INTRAOPERATIVE TRANSTHORACIC ECHOCARDIOGRAM;  Surgeon: Odie Benne, MD;  Location: [facility] OR;  Service: Open Heart Surgery;  Laterality: N/A;   IR ANGIO EXTERNAL CAROTID SEL EXT CAROTID UNI L MOD SED  09/28/2021   IR ANGIO INTRA EXTRACRAN SEL INTERNAL CAROTID UNI L MOD SED  09/28/2021   IR ANGIOGRAM FOLLOW UP STUDY  09/28/2021   IR ANGIOGRAM FOLLOW UP STUDY  09/30/2021   IR NEURO EACH ADD'L AFTER BASIC UNI LEFT (MS)  09/28/2021   IR NEURO EACH ADD'L AFTER BASIC UNI RIGHT (MS)  09/30/2021   IR TRANSCATH/EMBOLIZ  09/28/2021   LEFT HEART CATH AND CORONARY ANGIOGRAPHY N/A 06/09/2023   Procedure: LEFT HEART CATH AND CORONARY ANGIOGRAPHY;  Surgeon: Odie Benne, MD;   [facility];  Service: Cardiovascular;  Laterality: N/A;   PLACEMENT OF LUMBAR DRAIN N/A 06/17/2022   Procedure: PLACEMENT OF LUMBAR DRAIN;  Surgeon: Audie Bleacher, MD;  [facility];  Service: Neurosurgery;  Laterality: N/A;   RADIOLOGY WITH ANESTHESIA N/A 09/28/2021   Procedure: Middle meningeal artery embolization;  Surgeon: Augusto Blonder, MD;  Location: [facility] OR;  Service: Radiology;  Laterality: N/A;   RIGHT HEART CATH AND CORONARY ANGIOGRAPHY N/A 12/04/2020   Procedure: RIGHT HEART CATH AND CORONARY ANGIOGRAPHY;  Surgeon: Odie Benne, MD;  [facility];  Service: Cardiovascular;  Laterality: N/A;   TRANSCATHETER AORTIC VALVE REPLACEMENT, TRANSFEMORAL N/A 01/19/2021   Procedure: TRANSCATHETER AORTIC VALVE REPLACEMENT, TRANSFEMORAL USING A EDWARDS VALVE.;  Surgeon: Odie Benne, MD;  Location[facility];  Service: Open Heart Surgery;  Laterality: N/A;    MEDICATIONS: No current facility-administered medications for this encounter.    allopurinol  (ZYLOPRIM ) 300 MG tablet   amoxicillin  (AMOXIL ) 500 MG tablet   aspirin  EC 81 MG tablet   atorvastatin  (LIPITOR) 40 MG tablet   AURYXIA  1 GM 210 MG(Fe) tablet   calcitRIOL  (ROCALTROL ) 0.25 MCG capsule   cinacalcet (SENSIPAR) 60 MG tablet   gentamicin  cream (GARAMYCIN ) 0.1 %   Methoxy PEG-Epoetin Beta (MIRCERA IJ)   NON FORMULARY   Tenapanor  HCl, CKD, (XPHOZAH ) 30 MG TABS   Amoxicillin  is as needed for SBE given TAVR history.   Ella Gun, PA-C Surgical Short Stay/Anesthesiology [facility] Phone [phone] [facility] Phone [phone] 07/28/2023 10:01 AM

## 2023-07-28 NOTE — Progress Notes (Signed)
 SDW CALL  Patient was given pre-op  instructions over the phone. The opportunity was given for the patient to ask questions. No further questions asked. Patient verbalized understanding of instructions given.   PCP - Almira Jaeger, MD  Cardiologist - Odie Benne, MD   PPM/ICD - denies Device Orders - n/a Rep Notified - n/a  Chest x-ray - 04-21-23 EKG - 06-05-23 Stress Test - 07-12-23 ECHO - 01-19-22 Cardiac Cath - 06-09-23  Sleep Study -  CPAP - Uses nightly  Dm denies  Blood Thinner Instructions: denies Aspirin  Instructions: continue  ERAS Protcol - NPO PRE-SURGERY Ensure or G2-   COVID TEST-    Anesthesia review: yes hx of A-fib, CAD, ESRD  Patient denies shortness of breath, fever, cough and chest pain over the phone call   All instructions explained to the patient, with a verbal understanding of the material. Patient agrees to go over the instructions while at home for a better understanding.

## 2023-07-28 NOTE — Anesthesia Preprocedure Evaluation (Addendum)
 Anesthesia Evaluation  Patient identified by MRN, date of birth, ID band Patient awake    Reviewed: Allergy  & Precautions, H&P , NPO status , Patient's Chart, lab work & pertinent test results  Airway Mallampati: III  TM Distance: >3 FB Neck ROM: Full    Dental  (+) Teeth Intact, Dental Advisory Given   Pulmonary sleep apnea and Continuous Positive Airway Pressure Ventilation    Pulmonary exam normal breath sounds clear to auscultation       Cardiovascular hypertension (144/78 preop), + CAD and + Cardiac Stents (2012)  Normal cardiovascular exam+ dysrhythmias (off a/c since SDH 2023) Atrial Fibrillation + Valvular Problems/Murmurs (mild MR, s/p TAVR 2022 with normal function on 2023 echo) MR  Rhythm:Regular Rate:Normal  Echo 2023  1. Left ventricular ejection fraction, by estimation, is 60 to 65%. The  left ventricle has normal function. The left ventricle has no regional  wall motion abnormalities. There is mild left ventricular hypertrophy.  Left ventricular diastolic function  could not be evaluated.   2. Right ventricular systolic function is normal. The right ventricular  size is normal.   3. Left atrial size was moderately dilated.   4. The mitral valve is abnormal. Mild mitral valve regurgitation.   5. The aortic valve has been repaired/replaced. Aortic valve  regurgitation is not visualized. There is a 26 mm Sapien prosthetic (TAVR)  valve present in the aortic position. Echo findings are consistent with  normal structure and function of the aortic  valve prosthesis. Aortic valve area, by VTI measures 2.56 cm. Aortic  valve mean gradient measures 10.0 mmHg. Aortic valve Vmax measures 2.32  m/s. Peak gradient 21.5 mmHg, DI 0.48. No perivalvular leak.   6. Aortic dilatation noted. There is mild dilatation of the ascending  aorta, measuring 43 mm.     Neuro/Psych  Headaches  negative psych ROS   GI/Hepatic negative GI  ROS, Neg liver ROS,,,  Endo/Other  negative endocrine ROS  BMI 31  Renal/GU ESRF and DialysisRenal disease  negative genitourinary   Musculoskeletal negative musculoskeletal ROS (+)    Abdominal  (+) + obese  Peds negative pediatric ROS (+)  Hematology  (+) Blood dyscrasia, anemia Hb 10.9   Anesthesia Other Findings   Reproductive/Obstetrics negative OB ROS                             Anesthesia Physical Anesthesia Plan  ASA: 3  Anesthesia Plan: MAC and Regional   Post-op Pain Management: Tylenol  PO (pre-op )* and Regional block*   Induction:   PONV Risk Score and Plan: 2 and Propofol  infusion and TIVA  Airway Management Planned: Natural Airway and Simple Face Mask  Additional Equipment: None  Intra-op Plan:   Post-operative Plan:   Informed Consent: I have reviewed the patients History and Physical, chart, labs and discussed the procedure including the risks, benefits and alternatives for the proposed anesthesia with the patient or authorized representative who has indicated his/her understanding and acceptance.       Plan Discussed with: CRNA  Anesthesia Plan Comments: (Last airway note: Ventilation: Mask ventilation without difficulty Laryngoscope Size: Mac and 4 Grade View: Grade II Tube type: Oral Tube size: 7.5 mm Number of attempts: 1  )       Anesthesia Quick Evaluation

## 2023-07-29 DIAGNOSIS — Z992 Dependence on renal dialysis: Secondary | ICD-10-CM | POA: Diagnosis not present

## 2023-07-29 DIAGNOSIS — N041 Nephrotic syndrome with focal and segmental glomerular lesions: Secondary | ICD-10-CM | POA: Diagnosis not present

## 2023-07-29 DIAGNOSIS — N186 End stage renal disease: Secondary | ICD-10-CM | POA: Diagnosis not present

## 2023-07-30 DIAGNOSIS — N186 End stage renal disease: Secondary | ICD-10-CM | POA: Diagnosis not present

## 2023-07-30 DIAGNOSIS — N2581 Secondary hyperparathyroidism of renal origin: Secondary | ICD-10-CM | POA: Diagnosis not present

## 2023-07-30 DIAGNOSIS — Z4931 Encounter for adequacy testing for hemodialysis: Secondary | ICD-10-CM | POA: Diagnosis not present

## 2023-07-30 DIAGNOSIS — Z992 Dependence on renal dialysis: Secondary | ICD-10-CM | POA: Diagnosis not present

## 2023-07-30 DIAGNOSIS — D631 Anemia in chronic kidney disease: Secondary | ICD-10-CM | POA: Diagnosis not present

## 2023-08-01 ENCOUNTER — Encounter (HOSPITAL_COMMUNITY)
Admission: RE | Disposition: A | Discharge status: Home / Self Care | Payer: Self-pay | Source: Home / Self Care | Attending: Vascular Surgery

## 2023-08-01 ENCOUNTER — Ambulatory Visit (HOSPITAL_COMMUNITY): Payer: Self-pay | Admitting: Physician Assistant

## 2023-08-01 ENCOUNTER — Other Ambulatory Visit (HOSPITAL_COMMUNITY): Payer: Self-pay

## 2023-08-01 ENCOUNTER — Other Ambulatory Visit: Payer: Self-pay

## 2023-08-01 ENCOUNTER — Ambulatory Visit (HOSPITAL_COMMUNITY)
Admission: RE | Admit: 2023-08-01 | Discharge: 2023-08-01 | Disposition: A | Discharge status: Home / Self Care | Attending: Vascular Surgery | Admitting: Vascular Surgery

## 2023-08-01 ENCOUNTER — Encounter (HOSPITAL_COMMUNITY): Payer: Self-pay | Admitting: Vascular Surgery

## 2023-08-01 DIAGNOSIS — Z833 Family history of diabetes mellitus: Secondary | ICD-10-CM | POA: Insufficient documentation

## 2023-08-01 DIAGNOSIS — I120 Hypertensive chronic kidney disease with stage 5 chronic kidney disease or end stage renal disease: Secondary | ICD-10-CM | POA: Insufficient documentation

## 2023-08-01 DIAGNOSIS — D631 Anemia in chronic kidney disease: Secondary | ICD-10-CM | POA: Diagnosis not present

## 2023-08-01 DIAGNOSIS — I480 Paroxysmal atrial fibrillation: Secondary | ICD-10-CM | POA: Insufficient documentation

## 2023-08-01 DIAGNOSIS — R519 Headache, unspecified: Secondary | ICD-10-CM | POA: Insufficient documentation

## 2023-08-01 DIAGNOSIS — Z992 Dependence on renal dialysis: Secondary | ICD-10-CM | POA: Diagnosis not present

## 2023-08-01 DIAGNOSIS — I4891 Unspecified atrial fibrillation: Secondary | ICD-10-CM

## 2023-08-01 DIAGNOSIS — Z7982 Long term (current) use of aspirin: Secondary | ICD-10-CM | POA: Diagnosis not present

## 2023-08-01 DIAGNOSIS — Z79899 Other long term (current) drug therapy: Secondary | ICD-10-CM | POA: Insufficient documentation

## 2023-08-01 DIAGNOSIS — Z955 Presence of coronary angioplasty implant and graft: Secondary | ICD-10-CM | POA: Diagnosis not present

## 2023-08-01 DIAGNOSIS — G4733 Obstructive sleep apnea (adult) (pediatric): Secondary | ICD-10-CM | POA: Diagnosis not present

## 2023-08-01 DIAGNOSIS — E669 Obesity, unspecified: Secondary | ICD-10-CM | POA: Insufficient documentation

## 2023-08-01 DIAGNOSIS — I340 Nonrheumatic mitral (valve) insufficiency: Secondary | ICD-10-CM | POA: Diagnosis not present

## 2023-08-01 DIAGNOSIS — I6523 Occlusion and stenosis of bilateral carotid arteries: Secondary | ICD-10-CM | POA: Insufficient documentation

## 2023-08-01 DIAGNOSIS — I491 Atrial premature depolarization: Secondary | ICD-10-CM | POA: Insufficient documentation

## 2023-08-01 DIAGNOSIS — Z6831 Body mass index (BMI) 31.0-31.9, adult: Secondary | ICD-10-CM | POA: Insufficient documentation

## 2023-08-01 DIAGNOSIS — I2510 Atherosclerotic heart disease of native coronary artery without angina pectoris: Secondary | ICD-10-CM | POA: Diagnosis not present

## 2023-08-01 DIAGNOSIS — Z4902 Encounter for fitting and adjustment of peritoneal dialysis catheter: Secondary | ICD-10-CM

## 2023-08-01 DIAGNOSIS — Z952 Presence of prosthetic heart valve: Secondary | ICD-10-CM | POA: Insufficient documentation

## 2023-08-01 DIAGNOSIS — E782 Mixed hyperlipidemia: Secondary | ICD-10-CM | POA: Insufficient documentation

## 2023-08-01 DIAGNOSIS — N186 End stage renal disease: Secondary | ICD-10-CM | POA: Diagnosis not present

## 2023-08-01 DIAGNOSIS — M109 Gout, unspecified: Secondary | ICD-10-CM | POA: Diagnosis not present

## 2023-08-01 HISTORY — PX: CAPD REMOVAL: SHX5234

## 2023-08-01 HISTORY — PX: AV FISTULA PLACEMENT: SHX1204

## 2023-08-01 LAB — POCT I-STAT, CHEM 8
BUN: 11 mg/dL (ref 8–23)
Calcium, Ion: 0.99 mmol/L — ABNORMAL LOW (ref 1.15–1.40)
Chloride: 92 mmol/L — ABNORMAL LOW (ref 98–111)
Creatinine, Ser: 4.9 mg/dL — ABNORMAL HIGH (ref 0.61–1.24)
Glucose, Bld: 83 mg/dL (ref 70–99)
HCT: 32 % — ABNORMAL LOW (ref 39.0–52.0)
Hemoglobin: 10.9 g/dL — ABNORMAL LOW (ref 13.0–17.0)
Potassium: 3.5 mmol/L (ref 3.5–5.1)
Sodium: 133 mmol/L — ABNORMAL LOW (ref 135–145)
TCO2: 30 mmol/L (ref 22–32)

## 2023-08-01 SURGERY — ARTERIOVENOUS (AV) FISTULA CREATION
Anesthesia: Monitor Anesthesia Care | Site: Arm Upper | Laterality: Left

## 2023-08-01 MED ORDER — FENTANYL CITRATE (PF) 100 MCG/2ML IJ SOLN: 25.0000 ug | INTRAMUSCULAR | Status: DC | PRN

## 2023-08-01 MED ORDER — ORAL CARE MOUTH RINSE: 15.0000 mL | Freq: Once | OROMUCOSAL | Status: AC

## 2023-08-01 MED ORDER — CHLORHEXIDINE GLUCONATE 4 % EX SOLN: 60.0000 mL | Freq: Once | CUTANEOUS | Status: DC

## 2023-08-01 MED ORDER — PROPOFOL 10 MG/ML IV BOLUS
INTRAVENOUS | Status: DC | PRN
  Administered 2023-08-01: 50 mg via INTRAVENOUS
  Administered 2023-08-01: 200 mg via INTRAVENOUS

## 2023-08-01 MED ORDER — ONDANSETRON HCL 4 MG/2ML IJ SOLN
INTRAMUSCULAR | Status: DC | PRN
  Administered 2023-08-01: 4 mg via INTRAVENOUS

## 2023-08-01 MED ORDER — FENTANYL CITRATE (PF) 100 MCG/2ML IJ SOLN
INTRAMUSCULAR | Status: AC
Start: 2023-08-01 — End: 2023-08-01
  Administered 2023-08-01: 100 ug via INTRAVENOUS
  Filled 2023-08-01: qty 2

## 2023-08-01 MED ORDER — EPHEDRINE 5 MG/ML INJ
INTRAVENOUS | Status: AC
Start: 2023-08-01 — End: ?
  Filled 2023-08-01: qty 5

## 2023-08-01 MED ORDER — LIDOCAINE-EPINEPHRINE (PF) 1 %-1:200000 IJ SOLN
INTRAMUSCULAR | Status: AC
  Filled 2023-08-01: qty 30

## 2023-08-01 MED ORDER — ACETAMINOPHEN 500 MG PO TABS
1000.0000 mg | ORAL_TABLET | Freq: Once | ORAL | Status: AC
  Administered 2023-08-01: 1000 mg via ORAL
  Filled 2023-08-01: qty 2

## 2023-08-01 MED ORDER — HEPARIN 6000 UNIT IRRIGATION SOLUTION
Status: AC
Start: 2023-08-01 — End: ?
  Filled 2023-08-01: qty 500

## 2023-08-01 MED ORDER — FENTANYL CITRATE (PF) 100 MCG/2ML IJ SOLN: 100.0000 ug | Freq: Once | INTRAMUSCULAR | Status: AC

## 2023-08-01 MED ORDER — 0.9 % SODIUM CHLORIDE (POUR BTL) OPTIME
TOPICAL | Status: DC | PRN
  Administered 2023-08-01: 1000 mL

## 2023-08-01 MED ORDER — HYDROCODONE-ACETAMINOPHEN 5-325 MG PO TABS
1.0000 | ORAL_TABLET | Freq: Four times a day (QID) | ORAL | 0 refills | Status: DC | PRN
  Filled 2023-08-01: qty 10, 3d supply, fill #0

## 2023-08-01 MED ORDER — SODIUM CHLORIDE 0.9 % IV SOLN: INTRAVENOUS | Status: DC

## 2023-08-01 MED ORDER — PHENYLEPHRINE 80 MCG/ML (10ML) SYRINGE FOR IV PUSH (FOR BLOOD PRESSURE SUPPORT)
PREFILLED_SYRINGE | INTRAVENOUS | Status: DC | PRN
  Administered 2023-08-01 (×2): 80 ug via INTRAVENOUS
  Administered 2023-08-01: 160 ug via INTRAVENOUS

## 2023-08-01 MED ORDER — ONDANSETRON HCL 4 MG/2ML IJ SOLN: 4.0000 mg | Freq: Once | INTRAMUSCULAR | Status: DC | PRN

## 2023-08-01 MED ORDER — HEPARIN 6000 UNIT IRRIGATION SOLUTION
Status: DC | PRN
  Administered 2023-08-01: 1

## 2023-08-01 MED ORDER — SODIUM CHLORIDE 0.9% FLUSH: 3.0000 mL | INTRAVENOUS | Status: DC | PRN

## 2023-08-01 MED ORDER — CEFAZOLIN SODIUM-DEXTROSE 2-4 GM/100ML-% IV SOLN
2.0000 g | INTRAVENOUS | Status: AC
  Administered 2023-08-01: 2 g via INTRAVENOUS
  Filled 2023-08-01: qty 100

## 2023-08-01 MED ORDER — FENTANYL CITRATE (PF) 250 MCG/5ML IJ SOLN
INTRAMUSCULAR | Status: AC
Start: 2023-08-01 — End: ?
  Filled 2023-08-01: qty 5

## 2023-08-01 MED ORDER — MEPIVACAINE HCL (PF) 2 % IJ SOLN
INTRAMUSCULAR | Status: DC | PRN
Start: 2023-08-01 — End: 2023-08-01
  Administered 2023-08-01: 20 mL

## 2023-08-01 MED ORDER — MIDAZOLAM HCL 2 MG/2ML IJ SOLN: 2.0000 mg | Freq: Once | INTRAMUSCULAR | Status: AC

## 2023-08-01 MED ORDER — CHLORHEXIDINE GLUCONATE 0.12 % MT SOLN
15.0000 mL | Freq: Once | OROMUCOSAL | Status: AC
  Administered 2023-08-01: 15 mL via OROMUCOSAL
  Filled 2023-08-01: qty 15

## 2023-08-01 MED ORDER — FENTANYL CITRATE (PF) 250 MCG/5ML IJ SOLN
INTRAMUSCULAR | Status: DC | PRN
Start: 2023-08-01 — End: 2023-08-01
  Administered 2023-08-01: 100 ug via INTRAVENOUS

## 2023-08-01 MED ORDER — ONDANSETRON HCL 4 MG/2ML IJ SOLN
INTRAMUSCULAR | Status: AC
  Filled 2023-08-01: qty 2

## 2023-08-01 MED ORDER — MIDAZOLAM HCL 2 MG/2ML IJ SOLN
INTRAMUSCULAR | Status: AC
  Administered 2023-08-01: 2 mg via INTRAVENOUS
  Filled 2023-08-01: qty 2

## 2023-08-01 MED ORDER — LIDOCAINE 2% (20 MG/ML) 5 ML SYRINGE
INTRAMUSCULAR | Status: DC | PRN
  Administered 2023-08-01: 40 mg via INTRAVENOUS

## 2023-08-01 MED ORDER — EPHEDRINE SULFATE-NACL 50-0.9 MG/10ML-% IV SOSY
PREFILLED_SYRINGE | INTRAVENOUS | Status: DC | PRN
  Administered 2023-08-01: 5 mg via INTRAVENOUS
  Administered 2023-08-01: 7.5 mg via INTRAVENOUS
  Administered 2023-08-01: 5 mg via INTRAVENOUS

## 2023-08-01 SURGICAL SUPPLY — 26 items
ARMBAND PINK RESTRICT EXTREMIT (MISCELLANEOUS) ×2 IMPLANT
BAG COUNTER SPONGE SURGICOUNT (BAG) ×2 IMPLANT
CANISTER SUCTION 3000ML PPV (SUCTIONS) ×2 IMPLANT
CLIP LIGATING EXTRA MED SLVR (CLIP) ×2 IMPLANT
CLIP LIGATING EXTRA SM BLUE (MISCELLANEOUS) ×2 IMPLANT
COVER PROBE W GEL 5X96 (DRAPES) IMPLANT
DERMABOND ADVANCED .7 DNX12 (GAUZE/BANDAGES/DRESSINGS) ×3 IMPLANT
ELECTRODE REM PT RTRN 9FT ADLT (ELECTROSURGICAL) ×2 IMPLANT
GLOVE BIO SURGEON STRL SZ7.5 (GLOVE) ×2 IMPLANT
GOWN STRL REUS W/ TWL LRG LVL3 (GOWN DISPOSABLE) ×4 IMPLANT
GOWN STRL REUS W/ TWL XL LVL3 (GOWN DISPOSABLE) ×2 IMPLANT
INSERT FOGARTY SM (MISCELLANEOUS) IMPLANT
KIT BASIN OR (CUSTOM PROCEDURE TRAY) ×2 IMPLANT
KIT TURNOVER KIT B (KITS) ×2 IMPLANT
NS IRRIG 1000ML POUR BTL (IV SOLUTION) ×2 IMPLANT
PACK CV ACCESS (CUSTOM PROCEDURE TRAY) ×2 IMPLANT
PAD ARMBOARD POSITIONER FOAM (MISCELLANEOUS) ×4 IMPLANT
POWDER SURGICEL 3.0 GRAM (HEMOSTASIS) IMPLANT
SLING ARM FOAM STRAP LRG (SOFTGOODS) IMPLANT
SUT MNCRL AB 4-0 PS2 18 (SUTURE) ×3 IMPLANT
SUT PROLENE 6 0 BV (SUTURE) ×3 IMPLANT
SUT VIC AB 3-0 SH 27X BRD (SUTURE) ×3 IMPLANT
SUT VICRYL 0 UR6 27IN ABS (SUTURE) ×1 IMPLANT
TOWEL GREEN STERILE (TOWEL DISPOSABLE) ×2 IMPLANT
UNDERPAD 30X36 HEAVY ABSORB (UNDERPADS AND DIAPERS) ×2 IMPLANT
WATER STERILE IRR 1000ML POUR (IV SOLUTION) ×2 IMPLANT

## 2023-08-01 NOTE — Transfer of Care (Signed)
 Immediate Anesthesia Transfer of Care Note  Patient: Daniel Solomon.  Procedure(s) Performed: LEFT UPPER ARM BRACHIOCEPHALIC ARTERIOVENOUS (AV) FISTULA CREATION (Left: Arm Upper) CONTINUOUS AMBULATORY PERITONEAL DIALYSIS  (CAPD) CATHETER REMOVAL (Left: Arm Upper)  Patient Location: PACU  Anesthesia Type:General  Level of Consciousness: awake, patient cooperative, and responds to stimulation  Airway & Oxygen Therapy: Patient Spontanous Breathing and Patient connected to face mask oxygen  Post-op Assessment: Report given to RN and Post -op Vital signs reviewed and stable  Post vital signs: Reviewed and stable  Last Vitals:  Vitals Value Taken Time  BP 123/71   Temp    Pulse 84   Resp 21   SpO2 99     Last Pain:  Vitals:   08/01/23 0854  TempSrc:   PainSc: 0-No pain         Complications: No notable events documented.

## 2023-08-01 NOTE — Interval H&P Note (Addendum)
 History and Physical Interval Note:  08/01/2023 7:22 AM  [name]  has presented today for surgery, with the diagnosis of esrd.  The various methods of treatment have been discussed with the patient and family. After consideration of risks, benefits and other options for treatment, the patient has consented to  Procedure(s): ARTERIOVENOUS (AV) FISTULA CREATION (Left) INSERTION, GRAFT, ARTERIOVENOUS, UPPER EXTREMITY (Left) and removal of peritoneal dialysis catheter as a surgical intervention.  The patient's history has been reviewed, patient examined, no change in status, stable for surgery.  I have reviewed the patient's chart and labs.  Questions were answered to the patient's satisfaction.     Angela Kell

## 2023-08-01 NOTE — Anesthesia Postprocedure Evaluation (Signed)
 Anesthesia Post Note  Patient: Daniel Solomon.  Procedure(s) Performed: LEFT UPPER ARM BRACHIOCEPHALIC ARTERIOVENOUS (AV) FISTULA CREATION (Left: Arm Upper) CONTINUOUS AMBULATORY PERITONEAL DIALYSIS  (CAPD) CATHETER REMOVAL (Left: Arm Upper)     Patient location during evaluation: PACU Anesthesia Type: General Level of consciousness: awake and alert, oriented and patient cooperative Pain management: pain level controlled Vital Signs Assessment: post-procedure vital signs reviewed and stable Respiratory status: spontaneous breathing, nonlabored ventilation and respiratory function stable Cardiovascular status: blood pressure returned to baseline and stable Postop Assessment: no apparent nausea or vomiting Anesthetic complications: no   No notable events documented.  Last Vitals:  Vitals:   08/01/23 1145 08/01/23 1200  BP: 118/73 128/80  Pulse: 80 79  Resp: 16 14  Temp:  36.5 C  SpO2: 94% 93%    Last Pain:  Vitals:   08/01/23 1200  TempSrc:   PainSc: Asleep                 Jacquelyne Matte

## 2023-08-01 NOTE — Anesthesia Procedure Notes (Signed)
 Procedure Name: LMA Insertion Date/Time: 08/01/2023 10:08 AM  Performed by: Katrinka Parr, CRNAPre-anesthesia Checklist: Patient identified, Emergency Drugs available, Suction available and Patient being monitored Patient Re-evaluated:Patient Re-evaluated prior to induction Oxygen Delivery Method: Circle System Utilized Preoxygenation: Pre-oxygenation with 100% oxygen Induction Type: IV induction Ventilation: Mask ventilation without difficulty LMA: LMA inserted LMA Size: 4.0 Number of attempts: 1 Placement Confirmation: positive ETCO2 and breath sounds checked- equal and bilateral Tube secured with: Tape Dental Injury: Teeth and Oropharynx as per pre-operative assessment

## 2023-08-01 NOTE — Discharge Instructions (Signed)
   [facility]  Discharge Instructions  AV Fistula or Graft Surgery for Dialysis Access  Please refer to the following instructions for your post-procedure care. Your surgeon or physician assistant will discuss any changes with you.  Activity  You may drive the day following your surgery, if you are comfortable and no longer taking prescription pain medication. Resume full activity as the soreness in your incision resolves.  Bathing/Showering  You may shower after you go home. Keep your incision dry for 48 hours. Do not soak in a bathtub, hot tub, or swim until the incision heals completely. You may not shower if you have a hemodialysis catheter.  Incision Care  Clean your incision with mild soap and water  after 48 hours. Pat the area dry with a clean towel. You do not need a bandage unless otherwise instructed. Do not apply any ointments or creams to your incision. You may have skin glue on your incision. Do not peel it off. It will come off on its own in about one week. Your arm may swell a bit after surgery. To reduce swelling use pillows to elevate your arm so it is above your heart. Your doctor will tell you if you need to lightly wrap your arm with an ACE bandage.  Diet  Resume your normal diet. There are not special food restrictions following this procedure. In order to heal from your surgery, it is CRITICAL to get adequate nutrition. Your body requires vitamins, minerals, and protein. Vegetables are the best source of vitamins and minerals. Vegetables also provide the perfect balance of protein. Processed food has little nutritional value, so try to avoid this.  Medications  Resume taking all of your medications. If your incision is causing pain, you may take over-the counter pain relievers such as acetaminophen  (Tylenol ). If you were prescribed a stronger pain medication, please be aware these medications can cause nausea and constipation. Prevent  nausea by taking the medication with a snack or meal. Avoid constipation by drinking plenty of fluids and eating foods with high amount of fiber, such as fruits, vegetables, and grains.  Do not take Tylenol  if you are taking prescription pain medications.  Follow up Your surgeon may want to see you in the office following your access surgery. If so, this will be arranged at the time of your surgery.  Please call us  immediately for any of the following conditions:  Increased pain, redness, drainage (pus) from your incision site Fever of 101 degrees or higher Severe or worsening pain at your incision site Hand pain or numbness.  Reduce your risk of vascular disease:  Stop smoking. If you would like help, call [facility] at 1-800-QUIT-NOW (507-604-0073) or [place] at (985) 205-3001  Manage your cholesterol Maintain a desired weight Control your diabetes Keep your blood pressure down  Dialysis  It will take several weeks to several months for your new dialysis access to be ready for use. Your surgeon will determine when it is okay to use it. Your nephrologist will continue to direct your dialysis. You can continue to use your Permcath until your new access is ready for use.   08/01/2023 [facility] [id number] [birth year]  Surgeon(s): Adine Hoof, MD  Procedure(s): LEFT UPPER ARM BRACHIOCEPHALIC ARTERIOVENOUS (AV) FISTULA CREATION CONTINUOUS AMBULATORY PERITONEAL DIALYSIS  (CAPD) CATHETER REMOVAL  x Do not stick fistula for 12 weeks    If you have any questions, please call the office at [phone].

## 2023-08-01 NOTE — Op Note (Signed)
    Patient name: Daniel Solomon. MRN: [id number] DOB: [birth year] Sex: male  08/01/2023 Pre-operative Diagnosis: End-stage renal disease Post-operative diagnosis:  Same Surgeon:  Sarajane Cumming. Vikki Graves, MD Assistant: Maryanna Smart, PA; Donivan Furry, MS3 Procedure Performed: 1.  Left brachial artery to cephalic vein AV fistula creation 2.  Removal tunneled peritoneal dialysis catheter  Indications: 63 year old male on dialysis via right IJ tunneled dialysis catheter previously on dialysis via peritoneal dialysis catheter placed at a separate institution.  He is now planned for permanent hemodialysis access and removal of peritoneal dialysis catheter.  Experience assistant was necessary to facilitate exposure of both the cephalic vein and brachial artery and perform end-to-side anastomosis as well as exposing the peritoneal dialysis catheter and closing the anterior fascia.  Findings: Brachial artery measured 4 mm and was free of disease, cephalic vein easily dilated to 4.5 mm.  Completion demonstrated strong flow in the cephalic vein traced with Doppler and a palpable radial artery pulse at the wrist with strong ulnar signal.  PD catheter removed intact and anterior fascia closed primarily.   Procedure:  The patient was identified in the holding area and taken to the operating room where he was placed supine operative table and general anesthesia was induced.  He did undergo preoperative right upper extremity block prior to planning for PD cath removal.  Timeout was called and antibiotics were administered.  We began evaluating the left upper extremity where there was suitable cephalic vein at the antecubital although it did branch in the mid upper arm and was much larger above this area.  There was also a healthy appearing brachial artery by ultrasound.  A transverse incision was created we dissected out the vein and marked this for orientation divided branches between ties.  We dissected through the  deep fascia identified the brachial artery encircled this with vessel loop.  We then transected the vein distally and tied this off and serially dilated this up to 4.5 mm and flushed with heparinized saline.  The artery was clamped distally and proximally opened longitudinally and flushed with heparinized saline in both directions.  The vein was then sewn end-to-side with 6-0 Prolene suture.  Prior to completion we allowed flushing in all directions.  Upon completion there was very strong thrill traced up the upper arm with Doppler and a palpable radial artery pulse at the wrist as well as a strong ulnar signal.  Satisfied with this we irrigated the wound and closed in layers of Vicryl and Monocryl.  Attention was then turned to the abdomen where we traced the catheter with Doppler to where it entered the fascia.  A transverse incision was then created overlying the cuff and the fascia.  We dissected down to the fascial layer identified the cuff and remove this using combination of cautery and sharp dissection.  The catheter was noted to be intact and was then removed entirely by accessing the other cuff in the subcutaneous tissue.  We then closed the anterior fascial layer with 0 Vicryl on a UR 6 needle.  The wound was then irrigated and closed in layers of Vicryl and Monocryl and Dermabond was placed at the skin level.  He was then awakened from anesthesia having tolerated the procedure without any complication.  All counts were correct at completion.  EBL: 20 cc    Aariyah Sampey C. Vikki Graves, MD [facility] [place] Office: [phone] Pager: [phone]

## 2023-08-01 NOTE — Anesthesia Procedure Notes (Signed)
 Anesthesia Regional Block: Supraclavicular block   Pre-Anesthetic Checklist: , timeout performed,  Correct Patient, Correct Site, Correct Laterality,  Correct Procedure, Correct Position, site marked,  Risks and benefits discussed,  Surgical consent,  Pre-op  evaluation,  At surgeon's request and post-op pain management  Laterality: Left  Prep: Maximum Sterile Barrier Precautions used, chloraprep       Needles:  Injection technique: Single-shot  Needle Type: Echogenic Stimulator Needle     Needle Length: 9cm  Needle Gauge: 22     Additional Needles:   Procedures:,,,, ultrasound used (permanent image in chart),,    Narrative:  Start time: 08/01/2023 9:20 AM End time: 08/01/2023 9:25 AM Injection made incrementally with aspirations every 5 mL.  Performed by: Personally  Anesthesiologist: Jacquelyne Matte, DO  Additional Notes: Monitors applied. No increased pain on injection. No increased resistance to injection. Injection made in 5cc increments. Good needle visualization. Patient tolerated procedure well.

## 2023-08-01 NOTE — Transfer of Care (Deleted)
 Immediate Anesthesia Transfer of Care Note  Patient: Milad Bublitz.  Procedure(s) Performed: ARTERIOVENOUS (AV) FISTULA CREATION (Left) INSERTION, GRAFT, ARTERIOVENOUS, UPPER EXTREMITY (Left)  Patient Location: PACU  Anesthesia Type:MAC and Regional  Level of Consciousness: drowsy and patient cooperative  Airway & Oxygen Therapy: Patient Spontanous Breathing  Post-op Assessment: Report given to RN and Post -op Vital signs reviewed and stable  Post vital signs: Reviewed and stable  Last Vitals:  Vitals Value Taken Time  BP 117/77 08/01/23 0915  Temp    Pulse 71 08/01/23 0924  Resp 17 08/01/23 0924  SpO2 95 % 08/01/23 0924  Vitals shown include unfiled device data.  Last Pain:  Vitals:   08/01/23 0854  TempSrc:   PainSc: 0-No pain         Complications: No notable events documented.

## 2023-08-02 ENCOUNTER — Encounter (HOSPITAL_COMMUNITY): Payer: Self-pay | Admitting: Vascular Surgery

## 2023-08-16 ENCOUNTER — Encounter: Payer: Self-pay | Admitting: Physician Assistant

## 2023-08-16 NOTE — Telephone Encounter (Signed)
 Kidney removal note:  Pt reviewed by physicians on 06.18.25. Pt no longer meets selection criteria for listing per most recent policy.   Pt determined to be not a candidate for transplant at this time related to the items listed below and will be removed from the [facility] transplant  referral/ evaluation lists as of 08/16/2023  Pt informed that they may be referred to another transplant center by their nephrologist and verbalized understanding of the above. Letter sent to patient and copied to nephrologist and/or dialysis center.   - reduced exercise capacity - borderline weight - cumulative high risk with neurosurgical history   Letter was also routed to [facility] Pulm MD, advising pt continue his pulm work up, but that he is no longer seeking transplant clearance from that evaluation. Electronically signed by: Rosina LOISE Dasen, RN 08/16/2023 2:52 PM

## 2023-08-28 DIAGNOSIS — N186 End stage renal disease: Secondary | ICD-10-CM | POA: Diagnosis not present

## 2023-08-28 DIAGNOSIS — N041 Nephrotic syndrome with focal and segmental glomerular lesions: Secondary | ICD-10-CM | POA: Diagnosis not present

## 2023-08-28 DIAGNOSIS — Z992 Dependence on renal dialysis: Secondary | ICD-10-CM | POA: Diagnosis not present

## 2023-08-30 ENCOUNTER — Other Ambulatory Visit: Payer: Self-pay | Admitting: *Deleted

## 2023-08-30 DIAGNOSIS — N186 End stage renal disease: Secondary | ICD-10-CM | POA: Diagnosis not present

## 2023-08-30 DIAGNOSIS — Z992 Dependence on renal dialysis: Secondary | ICD-10-CM | POA: Diagnosis not present

## 2023-08-30 DIAGNOSIS — N2581 Secondary hyperparathyroidism of renal origin: Secondary | ICD-10-CM | POA: Diagnosis not present

## 2023-08-30 DIAGNOSIS — Z4932 Encounter for adequacy testing for peritoneal dialysis: Secondary | ICD-10-CM | POA: Diagnosis not present

## 2023-08-30 DIAGNOSIS — D631 Anemia in chronic kidney disease: Secondary | ICD-10-CM | POA: Diagnosis not present

## 2023-08-30 DIAGNOSIS — D509 Iron deficiency anemia, unspecified: Secondary | ICD-10-CM | POA: Diagnosis not present

## 2023-09-12 DIAGNOSIS — M6281 Muscle weakness (generalized): Secondary | ICD-10-CM | POA: Diagnosis not present

## 2023-09-12 DIAGNOSIS — R262 Difficulty in walking, not elsewhere classified: Secondary | ICD-10-CM | POA: Diagnosis not present

## 2023-09-12 DIAGNOSIS — M25551 Pain in right hip: Secondary | ICD-10-CM | POA: Diagnosis not present

## 2023-09-12 DIAGNOSIS — M25552 Pain in left hip: Secondary | ICD-10-CM | POA: Diagnosis not present

## 2023-09-13 ENCOUNTER — Ambulatory Visit (HOSPITAL_COMMUNITY)
Admission: RE | Admit: 2023-09-13 | Discharge: 2023-09-13 | Disposition: A | Discharge status: Home / Self Care | Source: Ambulatory Visit | Attending: Vascular Surgery | Admitting: Vascular Surgery

## 2023-09-13 ENCOUNTER — Ambulatory Visit (INDEPENDENT_AMBULATORY_CARE_PROVIDER_SITE_OTHER): Admitting: Physician Assistant

## 2023-09-13 VITALS — BP 150/86 | HR 73 | Temp 98.1°F | Wt 200.2 lb

## 2023-09-13 DIAGNOSIS — N186 End stage renal disease: Secondary | ICD-10-CM | POA: Insufficient documentation

## 2023-09-13 NOTE — Progress Notes (Signed)
    Postoperative Access Visit   History of Present Illness   Daniel Solomon. is a 63 y.o. year old male who presents for postoperative follow-up for: left brachiocephalic arteriovenous fistula and PD catheter removal (Date: 08/01/23).  The patient's wounds are healed.  The patient denies steal symptoms.  The patient is able to complete their activities of daily living.  He is currently dialyzing via R internal jugular TDC at home 4x/week   Physical Examination   Vitals:   09/13/23 1431  BP: (!) 150/86  Pulse: 73  Temp: 98.1 F (36.7 C)  TempSrc: Temporal  Weight: 200 lb 3.2 oz (90.8 kg)   Body mass index is 30.44 kg/m.  left arm Incision is healed, palpable radial pulse, hand grip is 5/5, sensation in digits is intact, palpable thrill, bruit can be auscultated   Patient deferred examination of abd incisions    Medical Decision Making   Daniel Claw. is a 63 y.o. year old male who presents s/p left brachiocephalic arteriovenous fistula  Patent L arm brachiocephalic fistula without signs or symptoms of steal syndrome The patient's access will be ready for use 10/30/23 The patient's tunneled dialysis catheter can be removed when Nephrology is comfortable with the performance of the fistula The patient may follow up on a prn basis   Daniel Sender PA-C [facility] [place] Office: [phone]  Clinic MD: Sheree

## 2023-09-14 DIAGNOSIS — M25551 Pain in right hip: Secondary | ICD-10-CM | POA: Diagnosis not present

## 2023-09-14 DIAGNOSIS — M25552 Pain in left hip: Secondary | ICD-10-CM | POA: Diagnosis not present

## 2023-09-14 DIAGNOSIS — M6281 Muscle weakness (generalized): Secondary | ICD-10-CM | POA: Diagnosis not present

## 2023-09-14 DIAGNOSIS — R262 Difficulty in walking, not elsewhere classified: Secondary | ICD-10-CM | POA: Diagnosis not present

## 2023-09-19 DIAGNOSIS — M25552 Pain in left hip: Secondary | ICD-10-CM | POA: Diagnosis not present

## 2023-09-19 DIAGNOSIS — M6281 Muscle weakness (generalized): Secondary | ICD-10-CM | POA: Diagnosis not present

## 2023-09-19 DIAGNOSIS — R262 Difficulty in walking, not elsewhere classified: Secondary | ICD-10-CM | POA: Diagnosis not present

## 2023-09-19 DIAGNOSIS — M25551 Pain in right hip: Secondary | ICD-10-CM | POA: Diagnosis not present

## 2023-09-21 DIAGNOSIS — R262 Difficulty in walking, not elsewhere classified: Secondary | ICD-10-CM | POA: Diagnosis not present

## 2023-09-21 DIAGNOSIS — M25552 Pain in left hip: Secondary | ICD-10-CM | POA: Diagnosis not present

## 2023-09-21 DIAGNOSIS — M6281 Muscle weakness (generalized): Secondary | ICD-10-CM | POA: Diagnosis not present

## 2023-09-21 DIAGNOSIS — M25551 Pain in right hip: Secondary | ICD-10-CM | POA: Diagnosis not present

## 2023-09-26 DIAGNOSIS — M6281 Muscle weakness (generalized): Secondary | ICD-10-CM | POA: Diagnosis not present

## 2023-09-26 DIAGNOSIS — M25552 Pain in left hip: Secondary | ICD-10-CM | POA: Diagnosis not present

## 2023-09-26 DIAGNOSIS — R262 Difficulty in walking, not elsewhere classified: Secondary | ICD-10-CM | POA: Diagnosis not present

## 2023-09-26 DIAGNOSIS — M25551 Pain in right hip: Secondary | ICD-10-CM | POA: Diagnosis not present

## 2023-09-27 DIAGNOSIS — M25551 Pain in right hip: Secondary | ICD-10-CM | POA: Diagnosis not present

## 2023-09-27 DIAGNOSIS — M25552 Pain in left hip: Secondary | ICD-10-CM | POA: Diagnosis not present

## 2023-09-27 DIAGNOSIS — M6281 Muscle weakness (generalized): Secondary | ICD-10-CM | POA: Diagnosis not present

## 2023-09-27 DIAGNOSIS — R262 Difficulty in walking, not elsewhere classified: Secondary | ICD-10-CM | POA: Diagnosis not present

## 2023-09-28 DIAGNOSIS — N041 Nephrotic syndrome with focal and segmental glomerular lesions: Secondary | ICD-10-CM | POA: Diagnosis not present

## 2023-09-28 DIAGNOSIS — N186 End stage renal disease: Secondary | ICD-10-CM | POA: Diagnosis not present

## 2023-09-28 DIAGNOSIS — Z992 Dependence on renal dialysis: Secondary | ICD-10-CM | POA: Diagnosis not present

## 2023-09-29 DIAGNOSIS — D631 Anemia in chronic kidney disease: Secondary | ICD-10-CM | POA: Diagnosis not present

## 2023-09-29 DIAGNOSIS — N2581 Secondary hyperparathyroidism of renal origin: Secondary | ICD-10-CM | POA: Diagnosis not present

## 2023-09-29 DIAGNOSIS — N186 End stage renal disease: Secondary | ICD-10-CM | POA: Diagnosis not present

## 2023-09-29 DIAGNOSIS — Z992 Dependence on renal dialysis: Secondary | ICD-10-CM | POA: Diagnosis not present

## 2023-09-29 DIAGNOSIS — D509 Iron deficiency anemia, unspecified: Secondary | ICD-10-CM | POA: Diagnosis not present

## 2023-10-03 DIAGNOSIS — N186 End stage renal disease: Secondary | ICD-10-CM | POA: Diagnosis not present

## 2023-10-03 DIAGNOSIS — I480 Paroxysmal atrial fibrillation: Secondary | ICD-10-CM | POA: Diagnosis not present

## 2023-10-03 DIAGNOSIS — M25552 Pain in left hip: Secondary | ICD-10-CM | POA: Diagnosis not present

## 2023-10-03 DIAGNOSIS — M25551 Pain in right hip: Secondary | ICD-10-CM | POA: Diagnosis not present

## 2023-10-03 DIAGNOSIS — R262 Difficulty in walking, not elsewhere classified: Secondary | ICD-10-CM | POA: Diagnosis not present

## 2023-10-03 DIAGNOSIS — Z01818 Encounter for other preprocedural examination: Secondary | ICD-10-CM | POA: Diagnosis not present

## 2023-10-03 DIAGNOSIS — M6281 Muscle weakness (generalized): Secondary | ICD-10-CM | POA: Diagnosis not present

## 2023-10-03 DIAGNOSIS — G4733 Obstructive sleep apnea (adult) (pediatric): Secondary | ICD-10-CM | POA: Diagnosis not present

## 2023-10-03 DIAGNOSIS — I2510 Atherosclerotic heart disease of native coronary artery without angina pectoris: Secondary | ICD-10-CM | POA: Diagnosis not present

## 2023-10-05 DIAGNOSIS — M25552 Pain in left hip: Secondary | ICD-10-CM | POA: Diagnosis not present

## 2023-10-05 DIAGNOSIS — M25551 Pain in right hip: Secondary | ICD-10-CM | POA: Diagnosis not present

## 2023-10-05 DIAGNOSIS — R262 Difficulty in walking, not elsewhere classified: Secondary | ICD-10-CM | POA: Diagnosis not present

## 2023-10-05 DIAGNOSIS — M6281 Muscle weakness (generalized): Secondary | ICD-10-CM | POA: Diagnosis not present

## 2023-10-09 NOTE — Progress Notes (Deleted)
 [name], PA-C [address] [place], [place]  [address] Phone: [phone]   Gastroenterology Consultation  Referring Provider:     Katrinka Garnette KIDD, MD Primary Care Physician:  Katrinka Garnette KIDD, MD Primary Gastroenterologist:  [name], PA-C / Dr. Gordy Starch  Reason for Consultation:     Nausea and Vomiting        HPI:   Daniel Solomon. is a 63 y.o. y/o male referred for consultation & management  by Katrinka Garnette KIDD, MD. here to evaluate nausea and vomiting.  He is currently going through kidney transplant evaluation at [facility].  Current symptoms:  PMH: ESRD on peritoneal dialysis (since 02/2020), nephrotic syndrome, Hx TAVR, CAD, mild cerebral artery aneurysm, P A-fib, sleep apnea (on CPAP), HTN, HLD.  Takes 81 Mg aspirin  daily.  01/2023 TTE LVEF 55 to 60%.  11/2014 last screening colonoscopy by Dr. Starch: 2 mm colon mucosa polyp removed from cecum.  Good prep.  10-year repeat (due 11/2024).  No previous EGD.  04/2023 abdominal MRI without contrast: Diffuse hepatic steatosis.  Normal gallbladder.  Normal intestines.  Small 5 mm cystic pancreas lesion consistent with IPMN.  11/2022 CT abdomen pelvis without contrast: No acute GI pathology.  Diverticulosis but no diverticulitis.  02/2021 abdominal MRI with and without contrast: Diffuse hepatic steatosis.  Subtle hepatic contour nodularity.  No liver lesions.  Normal gallbladder, pancreas, stomach, and intestines.  11/2020 CTA abdomen pelvis with and without contrast:   No GI pathology.  Normal liver, gallbladder, pancreas, and intestines.  Head CT without contrast 04/2023, to evaluate new onset seizure: No acute abnormality or significant interval change.  Previous left craniectomy and cranioplasty.   Past Medical History:  Diagnosis Date   Anemia    Anemia in chronic kidney disease 03/20/2020   Bicuspid aortic valve    Blood transfusion without reported diagnosis    CAD (coronary  artery disease)    proximal LAD 50%, mid LAD 50-60%, mid to distal LAD 99%; mid circumflex 20%; proximal RCA 20%, distal RCA 50%; PDA 60-70%, proximal posterior AV groove 50%, proximal posterior lateral 50%.  There was a question of possible LVOT gradient;  s/p Resolute DES to mid-distal LAD 10/2010;  echocardiogram 11/17/10: EF 55-60%, mild LVH, grade 1 diastolic dysfunction, normal aortic valve, mild MR, PASP 32.    Chicken pox    Dysrhythmia    A. Fib   Enlarged aorta ([name])    ESRD on peritoneal dialysis ([name])    Glaucoma    Gout    allopurinol  300mg , colchicine  prn in past, has not had flares   Headache    History of kidney stones    HTN (hypertension) 12/06/2010   Lisinopril  20mg ,  amlodipine  5mg . Home cuff runs high- likely 20/10 higher at least.      HCTZ- stopped by Dr. Douglass     Hypercholesterolemia    With hypertriglyceridemia   Hypertension    Mild aortic stenosis    PAF (paroxysmal atrial fibrillation) ([name])    a. Documented on event monitor 01/2016.   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   SDH (subdural hematoma) ([name]) 08/2021   left   Sleep apnea    cpap nightly    Past Surgical History:  Procedure Laterality Date   AV FISTULA PLACEMENT Left 08/01/2023  Procedure: LEFT UPPER ARM BRACHIOCEPHALIC ARTERIOVENOUS (AV) FISTULA CREATION;  Surgeon: Sheree Penne Bruckner, MD;  Location: [facility] OR;  Service: Vascular;  Laterality: Left;   CAPD REMOVAL Left 08/01/2023   Procedure: CONTINUOUS AMBULATORY PERITONEAL DIALYSIS  (CAPD) CATHETER REMOVAL;  Surgeon: Sheree Penne Bruckner, MD;  Location: [facility] OR;  Service: Vascular;  Laterality: Left;   COLONOSCOPY  2018   CORONARY STENT PLACEMENT  02/28/2010   CRANIOPLASTY N/A 02/25/2022   Procedure: Cranioplasty with custom plate;  Surgeon: Gillie Duncans, MD;  Location: [facility] OR;  Service:  Neurosurgery;  Laterality: N/A;   CRANIOPLASTY N/A 06/17/2022   Procedure: REVISION OF CRANIOPLASTY/PLACEMENT OF LUMBAR DRAIN;  Surgeon: Gillie Duncans, MD;  Location: [facility] OR;  Service: Neurosurgery;  Laterality: N/A;   CRANIOTOMY Left 10/05/2021   Procedure: CRANIOTOMY HEMATOMA EVACUATION SUBDURAL;  Surgeon: Gillie Duncans, MD;  Location: [facility] OR;  Service: Neurosurgery;  Laterality: Left;   CRANIOTOMY Left 10/07/2021   Procedure: Craniectomy for Recurrent  Intracranial Hematoma;  Surgeon: Gillie Duncans, MD;  Location: [facility] OR;  Service: Neurosurgery;  Laterality: Left;   CYSTOSCOPY/RETROGRADE/URETEROSCOPY  03/26/2012   Procedure: CYSTOSCOPY/RETROGRADE/URETEROSCOPY;  Surgeon: Oneil JAYSON Rafter, MD;  Location: [facility];  Service: Urology;  Laterality: Bilateral;  CYSTOSCOPY BILATERAL RETROGRADE PYELOGRAM AND POSSIBLE URETEROSCOPY   DIALYSIS/PERMA CATHETER INSERTION Right 06/22/2023   Procedure: DIALYSIS/PERMA CATHETER INSERTION;  Surgeon: Melia Lynwood ORN, MD;  Location: [facility] INVASIVE CV LAB;  Service: Cardiovascular;  Laterality: Right;   HERNIA REPAIR  2021   Umbilical Repair   INTRAOPERATIVE TRANSTHORACIC ECHOCARDIOGRAM N/A 01/19/2021   Procedure: INTRAOPERATIVE TRANSTHORACIC ECHOCARDIOGRAM;  Surgeon: Verlin Bruckner BIRCH, MD;  Location: [facility] OR;  Service: Open Heart Surgery;  Laterality: N/A;   IR ANGIO EXTERNAL CAROTID SEL EXT CAROTID UNI L MOD SED  09/28/2021   IR ANGIO INTRA EXTRACRAN SEL INTERNAL CAROTID UNI L MOD SED  09/28/2021   IR ANGIOGRAM FOLLOW UP STUDY  09/28/2021   IR ANGIOGRAM FOLLOW UP STUDY  09/30/2021   IR NEURO EACH ADD'L AFTER BASIC UNI LEFT (MS)  09/28/2021   IR NEURO EACH ADD'L AFTER BASIC UNI RIGHT (MS)  09/30/2021   IR TRANSCATH/EMBOLIZ  09/28/2021   LEFT HEART CATH AND CORONARY ANGIOGRAPHY N/A 06/09/2023   Procedure: LEFT HEART CATH AND CORONARY ANGIOGRAPHY;  Surgeon: Verlin Bruckner BIRCH, MD;  Location: [facility] INVASIVE CV LAB;  Service: Cardiovascular;  Laterality:  N/A;   PLACEMENT OF LUMBAR DRAIN N/A 06/17/2022   Procedure: PLACEMENT OF LUMBAR DRAIN;  Surgeon: Gillie Duncans, MD;  Location: [facility] OR;  Service: Neurosurgery;  Laterality: N/A;   RADIOLOGY WITH ANESTHESIA N/A 09/28/2021   Procedure: Middle meningeal artery embolization;  Surgeon: Lanis Pupa, MD;  Location: [facility] OR;  Service: Radiology;  Laterality: N/A;   RIGHT HEART CATH AND CORONARY ANGIOGRAPHY N/A 12/04/2020   Procedure: RIGHT HEART CATH AND CORONARY ANGIOGRAPHY;  Surgeon: Verlin Bruckner BIRCH, MD;  Location: [facility] INVASIVE CV LAB;  Service: Cardiovascular;  Laterality: N/A;   TRANSCATHETER AORTIC VALVE REPLACEMENT, TRANSFEMORAL N/A 01/19/2021   Procedure: TRANSCATHETER AORTIC VALVE REPLACEMENT, TRANSFEMORAL USING A EDWARDS VALVE.;  Surgeon: Verlin Bruckner BIRCH, MD;  Location: [facility] OR;  Service: Open Heart Surgery;  Laterality: N/A;    Prior to Admission medications   Medication Sig Start Date End Date Taking? Authorizing Provider  allopurinol  (ZYLOPRIM ) 300 MG tablet TAKE 1 TABLET BY MOUTH EVERY DAY 01/04/22   Katrinka Garnette KIDD, MD  amoxicillin  (AMOXIL ) 500 MG tablet Take FOUR tablets (2000 mg) by mouth ONE HOUR BEFORE ANY DENTAL  APPOINTMENT (including cleanings) 02/02/23   Verlin Lonni BIRCH, MD  aspirin  EC 81 MG tablet Take 1 tablet (81 mg total) by mouth daily. Swallow whole. 06/05/23   Verlin Lonni BIRCH, MD  atorvastatin  (LIPITOR) 40 MG tablet TAKE 1 TABLET BY MOUTH EVERY DAY 01/04/22   Katrinka Garnette KIDD, MD  AURYXIA  1 GM 210 MG(Fe) tablet Take 630 mg by mouth 3 (three) times daily with meals. 12/23/20   [provider]  calcitRIOL  (ROCALTROL ) 0.25 MCG capsule Take 0.25 mcg by mouth daily. 01/29/21   [provider]  cinacalcet (SENSIPAR) 60 MG tablet Take 60 mg by mouth daily.    [provider]  gentamicin  cream (GARAMYCIN ) 0.1 % Apply 1 Application topically daily. 04/07/22   [provider]  HYDROcodone -acetaminophen  (NORCO/VICODIN)  5-325 MG tablet Take 1 tablet by mouth every 6 (six) hours as needed for moderate pain (pain score 4-6). 08/01/23   Rhyne, Samantha J, PA-C  Methoxy PEG-Epoetin Beta (MIRCERA IJ) Inject into the skin as needed. 06/15/20   [provider]  NEXIUM 20 MG capsule Take by mouth. 08/18/23   [provider]  NON FORMULARY Pt uses a cpap nightly    [provider]  ondansetron  (ZOFRAN ) 4 MG tablet Take 4 mg by mouth 2 (two) times daily as needed for nausea or vomiting.    [provider]  Tenapanor  HCl, CKD, (XPHOZAH ) 30 MG TABS Take 30 mg by mouth daily.    [provider]    Family History  Problem Relation Age of Onset   Hypertension Father    CAD Father        CABG 7s   Kidney disease Father        Stage IV   Valvular heart disease Mother        AVR July 2015   Diabetes Paternal Grandmother    Colon cancer Neg Hx      Social History   Tobacco Use   Smoking status: Never   Smokeless tobacco: Never  Vaping Use   Vaping status: Never Used  Substance Use Topics   Alcohol use: No   Drug use: No    Allergies as of 10/10/2023   (No Known Allergies)    Review of Systems:    All systems reviewed and negative except where noted in HPI.   Physical Exam:  There were no vitals taken for this visit. No LMP for male patient.  General:   Alert,  Well-developed, well-nourished, pleasant and cooperative in NAD Lungs:  Respirations even and unlabored.  Clear throughout to auscultation.   No wheezes, crackles, or rhonchi. No acute distress. Heart:  Regular rate and rhythm; no murmurs, clicks, rubs, or gallops. Abdomen:  Normal bowel sounds.  No bruits.  Soft, and non-distended without masses, hepatosplenomegaly or hernias noted.  No Tenderness.  No guarding or rebound tenderness.    Neurologic:  Alert and oriented x3;  grossly normal neurologically. Psych:  Alert and cooperative. Normal mood and affect.  Imaging Studies: VAS US  DUPLEX DIALYSIS  ACCESS (AVF, AVG) Result Date: 09/13/2023 DIALYSIS ACCESS Patient Name:  Daniel Solomon.  Date of Exam:   09/13/2023 Medical Rec #: [id number]           Accession #:    [id number] Date of Birth: [birth year]          Patient Gender: M Patient Age:   64 years Exam Location:  [address] Procedure:      VAS US  DUPLEX  DIALYSIS ACCESS (AVF, AVG) Referring Phys: SAMANTHA RHYNE --------------------------------------------------------------------------------  Reason for Exam: Routine follow up. Access Site: Left Upper Extremity. Access Type: Brachial-cephalic AVF. History: Patient is s/p left brachial-cephalic AVF on 08/01/23. Performing Technologist: Edsel Mustard RVT  Examination Guidelines: A complete evaluation includes B-mode imaging, spectral Doppler, color Doppler, and power Doppler as needed of all accessible portions of each vessel. Unilateral testing is considered an integral part of a complete examination. Limited examinations for reoccurring indications may be performed as noted.  Findings: +--------------------+----------+-----------------+--------+ AVF                 PSV (cm/s)Flow Vol (mL/min)Comments +--------------------+----------+-----------------+--------+ Native artery inflow   343          1706                +--------------------+----------+-----------------+--------+ AVF Anastomosis        764                              +--------------------+----------+-----------------+--------+  +------------+---------+------------+---------+-------------------------------+ OUTFLOW VEIN   PSV     Diameter    Depth             Describe                          (cm/s)      (cm)      (cm)                                   +------------+---------+------------+---------+-------------------------------+ Shoulder       177       0.54      0.98                                   +------------+---------+------------+---------+-------------------------------+ Prox UA        215        0.48      0.40   competing branch measuring 0.19                                                         cm                +------------+---------+------------+---------+-------------------------------+ Mid UA         222       0.48      0.32                                   +------------+---------+------------+---------+-------------------------------+ Dist UA        227       0.52      0.33   competing branch measuring 0.39                                                         cm                +------------+---------+------------+---------+-------------------------------+  AC Fossa       391       0.87      0.19                                   +------------+---------+------------+---------+-------------------------------+ Normal antegrade flow in the left brachial artery distal to AVF anastomosis.   Summary: Patent arteriovenous fistula. Competing branches noted with the largest at the distal upper arm measuring 0.39 cm. Flow volume of 1706 mL/min. *See table(s) above for measurements and observations.  Diagnosing physician: Penne Colorado MD Electronically signed by Penne Colorado MD on 09/13/2023 at 5:09:29 PM.   --------------------------------------------------------------------------------   Final     Labs: CBC    Component Value Date/Time   WBC 7.2 06/05/2023 1005   WBC 7.3 04/21/2023 0902   RBC 3.47 (L) 06/05/2023 1005   RBC 3.34 (L) 04/21/2023 0902   HGB 10.9 (L) 08/01/2023 0805   HGB 12.1 (L) 06/05/2023 1005   HCT 32.0 (L) 08/01/2023 0805   HCT 36.2 (L) 06/05/2023 1005   PLT 184 06/05/2023 1005   MCV 104 (H) 06/05/2023 1005    CMP     Component Value Date/Time   NA 133 (L) 08/01/2023 0805   NA 144 06/05/2023 1005   K 3.5 08/01/2023 0805   CL 92 (L) 08/01/2023 0805   CO2 23 06/05/2023 1005   GLUCOSE 83 08/01/2023 0805   BUN 11 08/01/2023 0805   BUN 46 (H) 06/05/2023 1005   CREATININE 4.90 (H) 08/01/2023 0805   CREATININE 1.81 (H) 01/28/2016  0802   CALCIUM  8.7 06/05/2023 1005   PROT 6.9 06/05/2023 1005   ALBUMIN  4.2 06/05/2023 1005   AST 10 06/05/2023 1005   ALT 10 06/05/2023 1005   ALKPHOS 166 (H) 06/05/2023 1005   BILITOT 0.5 06/05/2023 1005   GFRNONAA 3 (L) 04/21/2023 0902   GFRAA 8 01/29/2020 0000    Assessment and Plan:   Kelvyn Schunk. is a 63 y.o. y/o male has been referred for ***  Follow up ***  [name], PA-C

## 2023-10-10 ENCOUNTER — Ambulatory Visit: Admitting: Physician Assistant

## 2023-10-10 DIAGNOSIS — M25552 Pain in left hip: Secondary | ICD-10-CM | POA: Diagnosis not present

## 2023-10-10 DIAGNOSIS — M6281 Muscle weakness (generalized): Secondary | ICD-10-CM | POA: Diagnosis not present

## 2023-10-10 DIAGNOSIS — R262 Difficulty in walking, not elsewhere classified: Secondary | ICD-10-CM | POA: Diagnosis not present

## 2023-10-10 DIAGNOSIS — M25551 Pain in right hip: Secondary | ICD-10-CM | POA: Diagnosis not present

## 2023-10-12 DIAGNOSIS — R262 Difficulty in walking, not elsewhere classified: Secondary | ICD-10-CM | POA: Diagnosis not present

## 2023-10-12 DIAGNOSIS — M25551 Pain in right hip: Secondary | ICD-10-CM | POA: Diagnosis not present

## 2023-10-12 DIAGNOSIS — M6281 Muscle weakness (generalized): Secondary | ICD-10-CM | POA: Diagnosis not present

## 2023-10-12 DIAGNOSIS — M25552 Pain in left hip: Secondary | ICD-10-CM | POA: Diagnosis not present

## 2023-10-17 DIAGNOSIS — M25551 Pain in right hip: Secondary | ICD-10-CM | POA: Diagnosis not present

## 2023-10-17 DIAGNOSIS — M6281 Muscle weakness (generalized): Secondary | ICD-10-CM | POA: Diagnosis not present

## 2023-10-17 DIAGNOSIS — M25552 Pain in left hip: Secondary | ICD-10-CM | POA: Diagnosis not present

## 2023-10-17 DIAGNOSIS — R262 Difficulty in walking, not elsewhere classified: Secondary | ICD-10-CM | POA: Diagnosis not present

## 2023-10-19 DIAGNOSIS — M6281 Muscle weakness (generalized): Secondary | ICD-10-CM | POA: Diagnosis not present

## 2023-10-19 DIAGNOSIS — M25551 Pain in right hip: Secondary | ICD-10-CM | POA: Diagnosis not present

## 2023-10-19 DIAGNOSIS — R262 Difficulty in walking, not elsewhere classified: Secondary | ICD-10-CM | POA: Diagnosis not present

## 2023-10-19 DIAGNOSIS — M25552 Pain in left hip: Secondary | ICD-10-CM | POA: Diagnosis not present

## 2023-10-24 DIAGNOSIS — R262 Difficulty in walking, not elsewhere classified: Secondary | ICD-10-CM | POA: Diagnosis not present

## 2023-10-24 DIAGNOSIS — M6281 Muscle weakness (generalized): Secondary | ICD-10-CM | POA: Diagnosis not present

## 2023-10-24 DIAGNOSIS — M25552 Pain in left hip: Secondary | ICD-10-CM | POA: Diagnosis not present

## 2023-10-24 DIAGNOSIS — M25551 Pain in right hip: Secondary | ICD-10-CM | POA: Diagnosis not present

## 2023-10-26 DIAGNOSIS — M25552 Pain in left hip: Secondary | ICD-10-CM | POA: Diagnosis not present

## 2023-10-26 DIAGNOSIS — R262 Difficulty in walking, not elsewhere classified: Secondary | ICD-10-CM | POA: Diagnosis not present

## 2023-10-26 DIAGNOSIS — M25551 Pain in right hip: Secondary | ICD-10-CM | POA: Diagnosis not present

## 2023-10-26 DIAGNOSIS — M6281 Muscle weakness (generalized): Secondary | ICD-10-CM | POA: Diagnosis not present

## 2023-10-29 DIAGNOSIS — N041 Nephrotic syndrome with focal and segmental glomerular lesions: Secondary | ICD-10-CM | POA: Diagnosis not present

## 2023-10-29 DIAGNOSIS — Z992 Dependence on renal dialysis: Secondary | ICD-10-CM | POA: Diagnosis not present

## 2023-10-29 DIAGNOSIS — N186 End stage renal disease: Secondary | ICD-10-CM | POA: Diagnosis not present

## 2023-10-30 DIAGNOSIS — D631 Anemia in chronic kidney disease: Secondary | ICD-10-CM | POA: Diagnosis not present

## 2023-10-30 DIAGNOSIS — Z992 Dependence on renal dialysis: Secondary | ICD-10-CM | POA: Diagnosis not present

## 2023-10-30 DIAGNOSIS — N186 End stage renal disease: Secondary | ICD-10-CM | POA: Diagnosis not present

## 2023-10-30 DIAGNOSIS — N2581 Secondary hyperparathyroidism of renal origin: Secondary | ICD-10-CM | POA: Diagnosis not present

## 2023-11-01 DIAGNOSIS — R262 Difficulty in walking, not elsewhere classified: Secondary | ICD-10-CM | POA: Diagnosis not present

## 2023-11-01 DIAGNOSIS — M6281 Muscle weakness (generalized): Secondary | ICD-10-CM | POA: Diagnosis not present

## 2023-11-01 DIAGNOSIS — M25552 Pain in left hip: Secondary | ICD-10-CM | POA: Diagnosis not present

## 2023-11-01 DIAGNOSIS — M25551 Pain in right hip: Secondary | ICD-10-CM | POA: Diagnosis not present

## 2023-11-08 ENCOUNTER — Encounter: Payer: Self-pay | Admitting: Podiatry

## 2023-11-08 ENCOUNTER — Ambulatory Visit (INDEPENDENT_AMBULATORY_CARE_PROVIDER_SITE_OTHER): Admitting: Podiatry

## 2023-11-08 ENCOUNTER — Ambulatory Visit (INDEPENDENT_AMBULATORY_CARE_PROVIDER_SITE_OTHER)

## 2023-11-08 VITALS — Ht 68.0 in | Wt 200.2 lb

## 2023-11-08 DIAGNOSIS — M7752 Other enthesopathy of left foot: Secondary | ICD-10-CM

## 2023-11-08 DIAGNOSIS — M65972 Unspecified synovitis and tenosynovitis, left ankle and foot: Secondary | ICD-10-CM | POA: Diagnosis not present

## 2023-11-08 DIAGNOSIS — M6281 Muscle weakness (generalized): Secondary | ICD-10-CM | POA: Diagnosis not present

## 2023-11-08 DIAGNOSIS — M7751 Other enthesopathy of right foot: Secondary | ICD-10-CM | POA: Diagnosis not present

## 2023-11-08 DIAGNOSIS — M25551 Pain in right hip: Secondary | ICD-10-CM | POA: Diagnosis not present

## 2023-11-08 DIAGNOSIS — R262 Difficulty in walking, not elsewhere classified: Secondary | ICD-10-CM | POA: Diagnosis not present

## 2023-11-08 DIAGNOSIS — M25552 Pain in left hip: Secondary | ICD-10-CM | POA: Diagnosis not present

## 2023-11-08 MED ORDER — BETAMETHASONE SOD PHOS & ACET 6 (3-3) MG/ML IJ SUSP
3.0000 mg | Freq: Once | INTRAMUSCULAR | Status: AC
  Administered 2023-11-08: 3 mg via INTRA_ARTICULAR

## 2023-11-08 MED ORDER — METHYLPREDNISOLONE 4 MG PO TBPK: ORAL_TABLET | ORAL | 0 refills | Status: DC

## 2023-11-08 NOTE — Progress Notes (Signed)
 Chief Complaint  Patient presents with   Foot Pain    Pt is here due to bilateral foot pain, states it has been going on for a long time, states the ball and top of each foot hurts, no prior injuries, takes OTC medicine for relief, complains of bilateral toe pain as well.    SUBJECTIVE Patient presenting today for evaluation of new onset of pain and tenderness associated to the left forefoot.  Ongoing for a few months now.  There has been some improvement over the last few months.  Idiopathic onset.  No history of injury.  Past Medical History:  Diagnosis Date   Anemia    Anemia in chronic kidney disease 03/20/2020   Bicuspid aortic valve    Blood transfusion without reported diagnosis    CAD (coronary artery disease)    proximal LAD 50%, mid LAD 50-60%, mid to distal LAD 99%; mid circumflex 20%; proximal RCA 20%, distal RCA 50%; PDA 60-70%, proximal posterior AV groove 50%, proximal posterior lateral 50%.  There was a question of possible LVOT gradient;  s/p Resolute DES to mid-distal LAD 10/2010;  echocardiogram 11/17/10: EF 55-60%, mild LVH, grade 1 diastolic dysfunction, normal aortic valve, mild MR, PASP 32.    Chicken pox    Dysrhythmia    A. Fib   Enlarged aorta ([name])    ESRD on peritoneal dialysis ([name])    Glaucoma    Gout    allopurinol  300mg , colchicine  prn in past, has not had flares   Headache    History of kidney stones    HTN (hypertension) 12/06/2010   Lisinopril  20mg ,  amlodipine  5mg . Home cuff runs high- likely 20/10 higher at least.      HCTZ- stopped by Dr. Douglass     Hypercholesterolemia    With hypertriglyceridemia   Hypertension    Mild aortic stenosis    PAF (paroxysmal atrial fibrillation) ([name])    a. Documented on event monitor 01/2016.   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   SDH (subdural hematoma) ([name]) 08/2021    left   Sleep apnea    cpap nightly    No Known Allergies   OBJECTIVE General Patient is awake, alert, and oriented x 3 and in no acute distress. Derm Skin is dry and supple bilateral. Negative open lesions or macerations. Remaining integument unremarkable. Nails are tender, long, thickened and dystrophic with subungual debris, consistent with onychomycosis, 1-5 bilateral. No signs of infection noted. Vasc  DP and PT pedal pulses palpable bilaterally. Temperature gradient within normal limits.  Neuro grossly intact via light touch Musculoskeletal Exam significant pain and tenderness with palpation and range of motion of the third MTP of the left foot Radiographic exam 11/08/2023 normal osseous mineralization.  Joint spaces preserved.  No acute fracture identified.  Impression: Negative  ASSESSMENT 1.  Third MTP capsulitis/synovitis of the left  PLAN OF CARE -Patient evaluated today.  X-rays reviewed - Injection of 0.5 cc Celestone  Soluspan injected around the third MTP left foot -Prescription for Medrol  Dosepak -No NSAIDs secondary to ESRD on dialysis -Recommend good supportive tennis shoes and sneakers.  OTC power step insoles were dispensed.  Wear daily -Return to clinic PRN  Thresa EMERSON Sar, DPM [facility]  Dr. Thresa EMERSON Sar, DPM    [address]  [place], [place] [address]                Office [phone]  Fax [phone]

## 2023-11-20 ENCOUNTER — Ambulatory Visit: Admitting: Podiatry

## 2023-11-28 DIAGNOSIS — N041 Nephrotic syndrome with focal and segmental glomerular lesions: Secondary | ICD-10-CM | POA: Diagnosis not present

## 2023-11-28 DIAGNOSIS — Z992 Dependence on renal dialysis: Secondary | ICD-10-CM | POA: Diagnosis not present

## 2023-11-28 DIAGNOSIS — N186 End stage renal disease: Secondary | ICD-10-CM | POA: Diagnosis not present

## 2023-11-29 DIAGNOSIS — N186 End stage renal disease: Secondary | ICD-10-CM | POA: Diagnosis not present

## 2023-11-29 DIAGNOSIS — N2581 Secondary hyperparathyroidism of renal origin: Secondary | ICD-10-CM | POA: Diagnosis not present

## 2023-11-29 DIAGNOSIS — Z4932 Encounter for adequacy testing for peritoneal dialysis: Secondary | ICD-10-CM | POA: Diagnosis not present

## 2023-11-29 DIAGNOSIS — D631 Anemia in chronic kidney disease: Secondary | ICD-10-CM | POA: Diagnosis not present

## 2023-11-29 DIAGNOSIS — Z992 Dependence on renal dialysis: Secondary | ICD-10-CM | POA: Diagnosis not present

## 2023-12-11 ENCOUNTER — Encounter: Payer: Self-pay | Admitting: Cardiovascular Disease

## 2023-12-11 MED ORDER — ATORVASTATIN CALCIUM 40 MG PO TABS: 40.0000 mg | ORAL_TABLET | Freq: Every day | ORAL | 2 refills | Status: AC

## 2023-12-29 DIAGNOSIS — N186 End stage renal disease: Secondary | ICD-10-CM | POA: Diagnosis not present

## 2023-12-29 DIAGNOSIS — N041 Nephrotic syndrome with focal and segmental glomerular lesions: Secondary | ICD-10-CM | POA: Diagnosis not present

## 2023-12-29 DIAGNOSIS — Z992 Dependence on renal dialysis: Secondary | ICD-10-CM | POA: Diagnosis not present

## 2024-01-08 ENCOUNTER — Encounter (HOSPITAL_COMMUNITY)
Admission: RE | Disposition: A | Discharge status: Home / Self Care | Payer: Self-pay | Source: Home / Self Care | Attending: Vascular Surgery

## 2024-01-08 ENCOUNTER — Ambulatory Visit (HOSPITAL_COMMUNITY)
Admission: RE | Admit: 2024-01-08 | Discharge: 2024-01-08 | Disposition: A | Discharge status: Home / Self Care | Attending: Vascular Surgery | Admitting: Vascular Surgery

## 2024-01-08 ENCOUNTER — Other Ambulatory Visit: Payer: Self-pay

## 2024-01-08 DIAGNOSIS — I120 Hypertensive chronic kidney disease with stage 5 chronic kidney disease or end stage renal disease: Secondary | ICD-10-CM | POA: Insufficient documentation

## 2024-01-08 DIAGNOSIS — N186 End stage renal disease: Secondary | ICD-10-CM | POA: Insufficient documentation

## 2024-01-08 DIAGNOSIS — Y832 Surgical operation with anastomosis, bypass or graft as the cause of abnormal reaction of the patient, or of later complication, without mention of misadventure at the time of the procedure: Secondary | ICD-10-CM | POA: Diagnosis not present

## 2024-01-08 DIAGNOSIS — T82898A Other specified complication of vascular prosthetic devices, implants and grafts, initial encounter: Secondary | ICD-10-CM | POA: Diagnosis not present

## 2024-01-08 DIAGNOSIS — T82590A Other mechanical complication of surgically created arteriovenous fistula, initial encounter: Secondary | ICD-10-CM | POA: Diagnosis not present

## 2024-01-08 DIAGNOSIS — Z992 Dependence on renal dialysis: Secondary | ICD-10-CM | POA: Diagnosis not present

## 2024-01-08 HISTORY — PX: A/V FISTULAGRAM: CATH118298

## 2024-01-08 SURGERY — A/V FISTULAGRAM
Anesthesia: LOCAL | Site: Arm Upper | Laterality: Left

## 2024-01-08 MED ORDER — LIDOCAINE HCL (PF) 1 % IJ SOLN
INTRAMUSCULAR | Status: AC
  Filled 2024-01-08: qty 30

## 2024-01-08 MED ORDER — HEPARIN (PORCINE) IN NACL 1000-0.9 UT/500ML-% IV SOLN
INTRAVENOUS | Status: DC | PRN
Start: 2024-01-08 — End: 2024-01-08
  Administered 2024-01-08: 500 mL

## 2024-01-08 MED ORDER — IODIXANOL 320 MG/ML IV SOLN
INTRAVENOUS | Status: DC | PRN
  Administered 2024-01-08: 15 mL via INTRAVENOUS

## 2024-01-08 MED ORDER — LIDOCAINE HCL (PF) 1 % IJ SOLN
INTRAMUSCULAR | Status: DC | PRN
  Administered 2024-01-08: 2 mL via SUBCUTANEOUS

## 2024-01-08 SURGICAL SUPPLY — 5 items
KIT MICROPUNCTURE NIT STIFF (SHEATH) IMPLANT
SHEATH PROBE COVER 6X72 (BAG) IMPLANT
STOPCOCK MORSE 400PSI 3WAY (MISCELLANEOUS) IMPLANT
TRAY PV CATH (CUSTOM PROCEDURE TRAY) ×1 IMPLANT
TUBING CIL FLEX 10 FLL-RA (TUBING) IMPLANT

## 2024-01-08 NOTE — H&P (View-Only) (Signed)
 [facility] [place]  ASSESSMENT / PLAN: 63 y.o. male with ESRD on HD via TDC. He has a left brachiocephalic AVF that is not working well at dialysis yet. Plan fistulagram to evaluate.   CHIEF COMPLAINT: ESRD  HISTORY OF PRESENT ILLNESS: Daniel Ashby. is a 63 y.o. male with ESRD on HD via TDC. He had a brachiocephalic AVF placed in June 2025. This has not yet been used at dialysis. His dialysis center is suspicious he has a large competing branch.  Past Medical History:  Diagnosis Date   Anemia    Anemia in chronic kidney disease 03/20/2020   Bicuspid aortic valve    Blood transfusion without reported diagnosis    CAD (coronary artery disease)    proximal LAD 50%, mid LAD 50-60%, mid to distal LAD 99%; mid circumflex 20%; proximal RCA 20%, distal RCA 50%; PDA 60-70%, proximal posterior AV groove 50%, proximal posterior lateral 50%.  There was a question of possible LVOT gradient;  s/p Resolute DES to mid-distal LAD 10/2010;  echocardiogram 11/17/10: EF 55-60%, mild LVH, grade 1 diastolic dysfunction, normal aortic valve, mild MR, PASP 32.    Chicken pox    Dysrhythmia    A. Fib   Enlarged aorta    ESRD on peritoneal dialysis ([name])    Glaucoma    Gout    allopurinol  300mg , colchicine  prn in past, has not had flares   Headache    History of kidney stones    HTN (hypertension) 12/06/2010   Lisinopril  20mg ,  amlodipine  5mg . Home cuff runs high- likely 20/10 higher at least.      HCTZ- stopped by Dr. Douglass     Hypercholesterolemia    With hypertriglyceridemia   Hypertension    Mild aortic stenosis    PAF (paroxysmal atrial fibrillation) ([name])    a. Documented on event monitor 01/2016.   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   SDH (subdural hematoma) ([name]) 08/2021   left   Sleep apnea    cpap nightly    Past  Surgical History:  Procedure Laterality Date   AV FISTULA PLACEMENT Left 08/01/2023   Procedure: LEFT UPPER ARM BRACHIOCEPHALIC ARTERIOVENOUS (AV) FISTULA CREATION;  Surgeon: Sheree Penne Bruckner, MD;  Location: [facility] OR;  Service: Vascular;  Laterality: Left;   CAPD REMOVAL Left 08/01/2023   Procedure: CONTINUOUS AMBULATORY PERITONEAL DIALYSIS  (CAPD) CATHETER REMOVAL;  Surgeon: Sheree Penne Bruckner, MD;  Location: [facility] OR;  Service: Vascular;  Laterality: Left;   COLONOSCOPY  2018   CORONARY STENT PLACEMENT  02/28/2010   CRANIOPLASTY N/A 02/25/2022   Procedure: Cranioplasty with custom plate;  Surgeon: Gillie Duncans, MD;  [facility]: [facility] OR;  Service: Neurosurgery;  Laterality: N/A;   CRANIOPLASTY N/A 06/17/2022   Procedure: REVISION OF CRANIOPLASTY/PLACEMENT OF LUMBAR DRAIN;  Surgeon: Gillie Duncans, MD;  Location: [facility] OR;  Service: Neurosurgery;  Laterality: N/A;   CRANIOTOMY Left 10/05/2021   Procedure: CRANIOTOMY HEMATOMA EVACUATION SUBDURAL;  Surgeon: Gillie Duncans, MD;  Location: [facility] OR;  Service: Neurosurgery;  Laterality: Left;   CRANIOTOMY Left 10/07/2021   Procedure: Craniectomy for Recurrent  Intracranial Hematoma;  Surgeon: Gillie Duncans, MD;  Location: [facility] OR;  Service: Neurosurgery;  Laterality: Left;   CYSTOSCOPY/RETROGRADE/URETEROSCOPY  03/26/2012   Procedure: CYSTOSCOPY/RETROGRADE/URETEROSCOPY;  Surgeon: Oneil JAYSON Rafter, MD;  Location: [facility];  Service:  Urology;  Laterality: Bilateral;  CYSTOSCOPY BILATERAL RETROGRADE PYELOGRAM AND POSSIBLE URETEROSCOPY   DIALYSIS/PERMA CATHETER INSERTION Right 06/22/2023   Procedure: DIALYSIS/PERMA CATHETER INSERTION;  Surgeon: Melia Lynwood ORN, MD;  Location: [facility] INVASIVE CV LAB;  Service: Cardiovascular;  Laterality: Right;   HERNIA REPAIR  2021   Umbilical Repair   INTRAOPERATIVE TRANSTHORACIC ECHOCARDIOGRAM N/A 01/19/2021   Procedure: INTRAOPERATIVE TRANSTHORACIC ECHOCARDIOGRAM;  Surgeon: Verlin Lonni BIRCH, MD;  Location: [facility]  OR;  Service: Open Heart Surgery;  Laterality: N/A;   IR ANGIO EXTERNAL CAROTID SEL EXT CAROTID UNI L MOD SED  09/28/2021   IR ANGIO INTRA EXTRACRAN SEL INTERNAL CAROTID UNI L MOD SED  09/28/2021   IR ANGIOGRAM FOLLOW UP STUDY  09/28/2021   IR ANGIOGRAM FOLLOW UP STUDY  09/30/2021   IR NEURO EACH ADD'L AFTER BASIC UNI LEFT (MS)  09/28/2021   IR NEURO EACH ADD'L AFTER BASIC UNI RIGHT (MS)  09/30/2021   IR TRANSCATH/EMBOLIZ  09/28/2021   LEFT HEART CATH AND CORONARY ANGIOGRAPHY N/A 06/09/2023   Procedure: LEFT HEART CATH AND CORONARY ANGIOGRAPHY;  Surgeon: Verlin Lonni BIRCH, MD;  [facility]: [facility] INVASIVE CV LAB;  Service: Cardiovascular;  Laterality: N/A;   PLACEMENT OF LUMBAR DRAIN N/A 06/17/2022   Procedure: PLACEMENT OF LUMBAR DRAIN;  Surgeon: Gillie Duncans, MD;  [facility] OR;  Service: Neurosurgery;  Laterality: N/A;   RADIOLOGY WITH ANESTHESIA N/A 09/28/2021   Procedure: Middle meningeal artery embolization;  Surgeon: Lanis Pupa, MD;  Location: [facility] OR;  Service: Radiology;  Laterality: N/A;   RIGHT HEART CATH AND CORONARY ANGIOGRAPHY N/A 12/04/2020   Procedure: RIGHT HEART CATH AND CORONARY ANGIOGRAPHY;  Surgeon: Verlin Lonni BIRCH, MD;  [facility] INVASIVE CV LAB;  Service: Cardiovascular;  Laterality: N/A;   TRANSCATHETER AORTIC VALVE REPLACEMENT, TRANSFEMORAL N/A 01/19/2021   Procedure: TRANSCATHETER AORTIC VALVE REPLACEMENT, TRANSFEMORAL USING A EDWARDS VALVE.;  Surgeon: Verlin Lonni BIRCH, MD;  [facility]: [facility] OR;  Service: Open Heart Surgery;  Laterality: N/A;    Family History  Problem Relation Age of Onset   Hypertension Father    CAD Father        CABG 18s   Kidney disease Father        Stage IV   Valvular heart disease Mother        AVR July 2015   Diabetes Paternal Grandmother    Colon cancer Neg Hx     Social History   Socioeconomic History   Marital status: Married    Spouse name: Not on file   Number of children: 4   Years of education:  Not on file   Highest education level: Bachelor's degree (e.g., BA, AB, BS)  Occupational History   Occupation: Research Scientist (physical Sciences): [employer]  Tobacco Use   Smoking status: Never   Smokeless tobacco: Never  Vaping Use   Vaping status: Never Used  Substance and Sexual Activity   Alcohol use: No   Drug use: No   Sexual activity: Yes  Other Topics Concern   Not on file  Social History Narrative   Family: Married, 4 children, 10 grandkids in 2025         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, manufacturing systems- looking for work   Laid off 2020 [employer] [place]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)   BS at [employer]: reading, church activities- L[employer]   Social Drivers of Corporate Investment Banker Strain: Low Risk  (  07/21/2023)   Overall Financial Resource Strain (CARDIA)    Difficulty of Paying Living Expenses: Not hard at all  Food Insecurity: No Food Insecurity (07/21/2023)   Hunger Vital Sign    Worried About Running Out of Food in the Last Year: Never true    Ran Out of Food in the Last Year: Never true  Transportation Needs: No Transportation Needs (07/21/2023)   PRAPARE - Administrator, Civil Service (Medical): No    Lack of Transportation (Non-Medical): No  Physical Activity: Insufficiently Active (07/21/2023)   Exercise Vital Sign    Days of Exercise per Week: 1 day    Minutes of Exercise per Session: 10 min  Stress: No Stress Concern Present (07/21/2023)   [employer] of Occupational Health - Occupational Stress Questionnaire    Feeling of Stress : Only a little  Social Connections: Socially Integrated (07/21/2023)   Social Connection and Isolation Panel    Frequency of Communication with Friends and Family: Twice a week    Frequency of Social Gatherings with Friends and Family: Once a week    Attends Religious Services: More than 4 times per year    Active Member of [employer] or Organizations: Yes    Attends Engineer, Structural: More  than 4 times per year    Marital Status: Married  Catering Manager Violence: Not At Risk (07/25/2023)   Humiliation, Afraid, Rape, and Kick questionnaire    Fear of Current or Ex-Partner: No    Emotionally Abused: No    Physically Abused: No    Sexually Abused: No    No Known Allergies  No current facility-administered medications for this encounter.    PHYSICAL EXAM Vitals:   01/08/24 0710  BP: 134/77  Pulse: 62  Resp: 14  SpO2: 98%   No distress L AVF with strong thrill  PERTINENT LABORATORY AND RADIOLOGIC DATA  Most recent CBC    Latest Ref Rng & Units 08/01/2023    8:05 AM 06/05/2023   10:05 AM 04/21/2023    9:07 AM  CBC  WBC 3.4 - 10.8 x10E3/uL  7.2    Hemoglobin 13.0 - 17.0 g/dL 89.0  87.8  87.3   Hematocrit 39.0 - 52.0 % 32.0  36.2  37.0   Platelets 150 - 450 x10E3/uL  184       Most recent CMP    Latest Ref Rng & Units 08/01/2023    8:05 AM 06/05/2023   10:05 AM 04/28/2023   10:08 AM  CMP  Glucose 70 - 99 mg/dL 83  889  877   BUN 8 - 23 mg/dL 11  46  73   Creatinine 0.61 - 1.24 mg/dL 5.09  84.88  83.85   Sodium 135 - 145 mmol/L 133  144  142   Potassium 3.5 - 5.1 mmol/L 3.5  4.1  4.1   Chloride 98 - 111 mmol/L 92  95  97   CO2 20 - 29 mmol/L  23  28   Calcium  8.6 - 10.2 mg/dL  8.7  8.0    7.9   Total Protein 6.0 - 8.5 g/dL  6.9  6.5   Total Bilirubin 0.0 - 1.2 mg/dL  0.5  0.4   Alkaline Phos 44 - 121 IU/L  166  94   AST 0 - 40 IU/L  10  11   ALT 0 - 44 IU/L  10  16     Renal function CrCl cannot be calculated (Patient's most recent  lab result is older than the maximum 21 days allowed.).  Hgb A1c MFr Bld (%)  Date Value  01/12/2016 5.1    LDL Chol Calc (NIH)  Date Value Ref Range Status  06/05/2023 85 0 - 99 mg/dL Final   Direct LDL  Date Value Ref Range Status  02/10/2012 70.9 mg/dL Final    Comment:    Optimal:  <100 mg/dLNear or Above Optimal:  100-129 mg/dLBorderline High:  130-159 mg/dLHigh:  160-189 mg/dLVery High:  >190 mg/dL     Debby SAILOR. Magda, MD FACS [facility] Phone Number: [phone] 01/08/2024 7:29 AM   Total time spent on preparing this encounter including chart review, data review, collecting history, examining the patient, and coordinating care: 30 minutes.   Portions of this report may have been transcribed using voice recognition software.  Every effort has been made to ensure accuracy; however, inadvertent computerized transcription errors may still be present.

## 2024-01-08 NOTE — Op Note (Signed)
 DATE OF SERVICE: 01/08/2024  PATIENT:  Daniel Solomon.  63 y.o. male  PRE-OPERATIVE DIAGNOSIS:  end-stage renal disease  POST-OPERATIVE DIAGNOSIS:  Same  PROCEDURE:   1) Ultrasound guided left arm AVF access (CPT [phone]) 2) Left arm fistulagram (CPT [phone]) 3) established outpatient evaluation and management - level 3 (CPT 99213)  SURGEON:  Debby SAILOR. Magda, MD  ASSISTANT: none  ANESTHESIA:   local  ESTIMATED BLOOD LOSS: min  LOCAL MEDICATIONS USED:  LIDOCAINE    COUNTS: confirmed correct.  PATIENT DISPOSITION:  PACU - hemodynamically stable.   Delay start of Pharmacological VTE agent (>24hrs) due to surgical blood loss or risk of bleeding: no  INDICATION FOR PROCEDURE: Daniel Solomon. is a 63 y.o. male with left arm AVF that has not been reliably used at dialysis yet. After careful discussion of risks, benefits, and alternatives the patient was offered fistulagram. The patient understood and wished to proceed.  OPERATIVE FINDINGS:  Left Upper Extremity Central venous: no stenosis Subclavian vein: no stenosis Cephalic arch: duplicated system without stenosis Fistula: no stenosis; large competing branch near the elbow, smaller competing branch in upper arm Anastomosis: no stenosis  DESCRIPTION OF PROCEDURE: After identification of the patient in the pre-operative holding area, the patient was transferred to the operating room. The patient was positioned supine on the operating room table.  The left upper extremity was prepped and draped in standard fashion. A surgical pause was performed confirming correct patient, procedure, and operative location.  The left upper extremity was anesthetized with subcutaneous injection of 1% lidocaine  over the area of planned access. Using ultrasound guidance, the left upper extremity dialysis access was accessed with micropuncture technique.  Fistulogram was performed in stations with the micro sheath.  See above for details.  All  endovascular equipment was removed.  A figure-of-eight stitch was applied to the exit site with good hemostasis.  Sterile bandage was applied.  Upon completion of the case instrument and sharps counts were confirmed correct. The patient was transferred to the PACU in good condition. I was present for all portions of the procedure.  PLAN: Large competing branch near antecubitum. Smaller branches seen higher in arm. No stenosis. Plan revision with side branch ligation on non-dialysis day in near future.   Debby SAILOR. Magda, MD [facility] Phone Number: [phone] 01/08/2024 8:04 AM

## 2024-01-08 NOTE — H&P (Signed)
 [facility] [place]  ASSESSMENT / PLAN: 63 y.o. male with ESRD on HD via TDC. He has a left brachiocephalic AVF that is not working well at dialysis yet. Plan fistulagram to evaluate.   CHIEF COMPLAINT: ESRD  HISTORY OF PRESENT ILLNESS: Daniel Solomon. is a 63 y.o. male with ESRD on HD via TDC. He had a brachiocephalic AVF placed in June 2025. This has not yet been used at dialysis. His dialysis center is suspicious he has a large competing branch.  Past Medical History:  Diagnosis Date   Anemia    Anemia in chronic kidney disease 03/20/2020   Bicuspid aortic valve    Blood transfusion without reported diagnosis    CAD (coronary artery disease)    proximal LAD 50%, mid LAD 50-60%, mid to distal LAD 99%; mid circumflex 20%; proximal RCA 20%, distal RCA 50%; PDA 60-70%, proximal posterior AV groove 50%, proximal posterior lateral 50%.  There was a question of possible LVOT gradient;  s/p Resolute DES to mid-distal LAD 10/2010;  echocardiogram 11/17/10: EF 55-60%, mild LVH, grade 1 diastolic dysfunction, normal aortic valve, mild MR, PASP 32.    Chicken pox    Dysrhythmia    A. Fib   Enlarged aorta    ESRD on peritoneal dialysis ([name])    Glaucoma    Gout    allopurinol  300mg , colchicine  prn in past, has not had flares   Headache    History of kidney stones    HTN (hypertension) 12/06/2010   Lisinopril  20mg ,  amlodipine  5mg . Home cuff runs high- likely 20/10 higher at least.      HCTZ- stopped by Dr. Douglass     Hypercholesterolemia    With hypertriglyceridemia   Hypertension    Mild aortic stenosis    PAF (paroxysmal atrial fibrillation) ([name])    a. Documented on event monitor 01/2016.   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   SDH (subdural hematoma) ([name]) 08/2021   left   Sleep apnea    cpap nightly    Past  Surgical History:  Procedure Laterality Date   AV FISTULA PLACEMENT Left 08/01/2023   Procedure: LEFT UPPER ARM BRACHIOCEPHALIC ARTERIOVENOUS (AV) FISTULA CREATION;  Surgeon: Sheree Penne Bruckner, MD;  Location: [facility] OR;  Service: Vascular;  Laterality: Left;   CAPD REMOVAL Left 08/01/2023   Procedure: CONTINUOUS AMBULATORY PERITONEAL DIALYSIS  (CAPD) CATHETER REMOVAL;  Surgeon: Sheree Penne Bruckner, MD;  Location: [facility] OR;  Service: Vascular;  Laterality: Left;   COLONOSCOPY  2018   CORONARY STENT PLACEMENT  02/28/2010   CRANIOPLASTY N/A 02/25/2022   Procedure: Cranioplasty with custom plate;  Surgeon: Gillie Duncans, MD;  Location: [facility] OR;  Service: Neurosurgery;  Laterality: N/A;   CRANIOPLASTY N/A 06/17/2022   Procedure: REVISION OF CRANIOPLASTY/PLACEMENT OF LUMBAR DRAIN;  Surgeon: Gillie Duncans, MD;  Location: [facility] OR;  Service: Neurosurgery;  Laterality: N/A;   CRANIOTOMY Left 10/05/2021   Procedure: CRANIOTOMY HEMATOMA EVACUATION SUBDURAL;  Surgeon: Gillie Duncans, MD;  Location: [facility] OR;  Service: Neurosurgery;  Laterality: Left;   CRANIOTOMY Left 10/07/2021   Procedure: Craniectomy for Recurrent  Intracranial Hematoma;  Surgeon: Gillie Duncans, MD;  Location: [facility] OR;  Service: Neurosurgery;  Laterality: Left;   CYSTOSCOPY/RETROGRADE/URETEROSCOPY  03/26/2012   Procedure: CYSTOSCOPY/RETROGRADE/URETEROSCOPY;  Surgeon: Oneil JAYSON Rafter, MD;  Location: [facility];  Service:  Urology;  Laterality: Bilateral;  CYSTOSCOPY BILATERAL RETROGRADE PYELOGRAM AND POSSIBLE URETEROSCOPY   DIALYSIS/PERMA CATHETER INSERTION Right 06/22/2023   Procedure: DIALYSIS/PERMA CATHETER INSERTION;  Surgeon: Melia Lynwood ORN, MD;  Location: [facility] INVASIVE CV LAB;  Service: Cardiovascular;  Laterality: Right;   HERNIA REPAIR  2021   Umbilical Repair   INTRAOPERATIVE TRANSTHORACIC ECHOCARDIOGRAM N/A 01/19/2021   Procedure: INTRAOPERATIVE TRANSTHORACIC ECHOCARDIOGRAM;  Surgeon: Verlin Lonni BIRCH, MD;  Location: [facility]  OR;  Service: Open Heart Surgery;  Laterality: N/A;   IR ANGIO EXTERNAL CAROTID SEL EXT CAROTID UNI L MOD SED  09/28/2021   IR ANGIO INTRA EXTRACRAN SEL INTERNAL CAROTID UNI L MOD SED  09/28/2021   IR ANGIOGRAM FOLLOW UP STUDY  09/28/2021   IR ANGIOGRAM FOLLOW UP STUDY  09/30/2021   IR NEURO EACH ADD'L AFTER BASIC UNI LEFT (MS)  09/28/2021   IR NEURO EACH ADD'L AFTER BASIC UNI RIGHT (MS)  09/30/2021   IR TRANSCATH/EMBOLIZ  09/28/2021   LEFT HEART CATH AND CORONARY ANGIOGRAPHY N/A 06/09/2023   Procedure: LEFT HEART CATH AND CORONARY ANGIOGRAPHY;  Surgeon: Verlin Lonni BIRCH, MD;  [facility] LAB;  Service: Cardiovascular;  Laterality: N/A;   PLACEMENT OF LUMBAR DRAIN N/A 06/17/2022   Procedure: PLACEMENT OF LUMBAR DRAIN;  Surgeon: Gillie Duncans, MD;  [facility]: [facility];  Service: Neurosurgery;  Laterality: N/A;   RADIOLOGY WITH ANESTHESIA N/A 09/28/2021   Procedure: Middle meningeal artery embolization;  Surgeon: Lanis Pupa, MD;  Location: [facility] OR;  Service: Radiology;  Laterality: N/A;   RIGHT HEART CATH AND CORONARY ANGIOGRAPHY N/A 12/04/2020   Procedure: RIGHT HEART CATH AND CORONARY ANGIOGRAPHY;  Surgeon: Verlin Lonni BIRCH, MD;  [facility];  Service: Cardiovascular;  Laterality: N/A;   TRANSCATHETER AORTIC VALVE REPLACEMENT, TRANSFEMORAL N/A 01/19/2021   Procedure: TRANSCATHETER AORTIC VALVE REPLACEMENT, TRANSFEMORAL USING A EDWARDS VALVE.;  Surgeon: Verlin Lonni BIRCH, MD;  Location: [facility];  Service: Open Heart Surgery;  Laterality: N/A;    Family History  Problem Relation Age of Onset   Hypertension Father    CAD Father        CABG 18s   Kidney disease Father        Stage IV   Valvular heart disease Mother        AVR July 2015   Diabetes Paternal Grandmother    Colon cancer Neg Hx     Social History   Socioeconomic History   Marital status: Married    Spouse name: Not on file   Number of children: 4   Years of education:  Not on file   Highest education level: Bachelor's degree (e.g., BA, AB, BS)  Occupational History   Occupation: Research Scientist (physical Sciences): [employer]  Tobacco Use   Smoking status: Never   Smokeless tobacco: Never  Vaping Use   Vaping status: Never Used  Substance and Sexual Activity   Alcohol use: No   Drug use: No   Sexual activity: Yes  Other Topics Concern   Not on file  Social History Narrative   Family: Married, 4 children, 10 grandkids in 2025         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, manufacturing systems- looking for work   Laid off 2020 [employer] [place]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)   BS at [employer]: reading, church activities- L[employer]   Social Drivers of Corporate Investment Banker Strain: Low Risk  (  07/21/2023)   Overall Financial Resource Strain (CARDIA)    Difficulty of Paying Living Expenses: Not hard at all  Food Insecurity: No Food Insecurity (07/21/2023)   Hunger Vital Sign    Worried About Running Out of Food in the Last Year: Never true    Ran Out of Food in the Last Year: Never true  Transportation Needs: No Transportation Needs (07/21/2023)   PRAPARE - Administrator, Civil Service (Medical): No    Lack of Transportation (Non-Medical): No  Physical Activity: Insufficiently Active (07/21/2023)   Exercise Vital Sign    Days of Exercise per Week: 1 day    Minutes of Exercise per Session: 10 min  Stress: No Stress Concern Present (07/21/2023)   [employer] of Occupational Health - Occupational Stress Questionnaire    Feeling of Stress : Only a little  Social Connections: Socially Integrated (07/21/2023)   Social Connection and Isolation Panel    Frequency of Communication with Friends and Family: Twice a week    Frequency of Social Gatherings with Friends and Family: Once a week    Attends Religious Services: More than 4 times per year    Active Member of [employer] or Organizations: Yes    Attends Engineer, Structural: More  than 4 times per year    Marital Status: Married  Catering Manager Violence: Not At Risk (07/25/2023)   Humiliation, Afraid, Rape, and Kick questionnaire    Fear of Current or Ex-Partner: No    Emotionally Abused: No    Physically Abused: No    Sexually Abused: No    No Known Allergies  No current facility-administered medications for this encounter.    PHYSICAL EXAM Vitals:   01/08/24 0710  BP: 134/77  Pulse: 62  Resp: 14  SpO2: 98%   No distress L AVF with strong thrill  PERTINENT LABORATORY AND RADIOLOGIC DATA  Most recent CBC    Latest Ref Rng & Units 08/01/2023    8:05 AM 06/05/2023   10:05 AM 04/21/2023    9:07 AM  CBC  WBC 3.4 - 10.8 x10E3/uL  7.2    Hemoglobin 13.0 - 17.0 g/dL 89.0  87.8  87.3   Hematocrit 39.0 - 52.0 % 32.0  36.2  37.0   Platelets 150 - 450 x10E3/uL  184       Most recent CMP    Latest Ref Rng & Units 08/01/2023    8:05 AM 06/05/2023   10:05 AM 04/28/2023   10:08 AM  CMP  Glucose 70 - 99 mg/dL 83  889  877   BUN 8 - 23 mg/dL 11  46  73   Creatinine 0.61 - 1.24 mg/dL 5.09  84.88  83.85   Sodium 135 - 145 mmol/L 133  144  142   Potassium 3.5 - 5.1 mmol/L 3.5  4.1  4.1   Chloride 98 - 111 mmol/L 92  95  97   CO2 20 - 29 mmol/L  23  28   Calcium  8.6 - 10.2 mg/dL  8.7  8.0    7.9   Total Protein 6.0 - 8.5 g/dL  6.9  6.5   Total Bilirubin 0.0 - 1.2 mg/dL  0.5  0.4   Alkaline Phos 44 - 121 IU/L  166  94   AST 0 - 40 IU/L  10  11   ALT 0 - 44 IU/L  10  16     Renal function CrCl cannot be calculated (Patient's most recent  lab result is older than the maximum 21 days allowed.).  Hgb A1c MFr Bld (%)  Date Value  01/12/2016 5.1    LDL Chol Calc (NIH)  Date Value Ref Range Status  06/05/2023 85 0 - 99 mg/dL Final   Direct LDL  Date Value Ref Range Status  02/10/2012 70.9 mg/dL Final    Comment:    Optimal:  <100 mg/dLNear or Above Optimal:  100-129 mg/dLBorderline High:  130-159 mg/dLHigh:  160-189 mg/dLVery High:  >190 mg/dL     Debby SAILOR. Magda, MD FACS [facility] Phone Number: [phone] 01/08/2024 7:29 AM   Total time spent on preparing this encounter including chart review, data review, collecting history, examining the patient, and coordinating care: 30 minutes.   Portions of this report may have been transcribed using voice recognition software.  Every effort has been made to ensure accuracy; however, inadvertent computerized transcription errors may still be present.

## 2024-01-09 ENCOUNTER — Other Ambulatory Visit: Payer: Self-pay

## 2024-01-09 ENCOUNTER — Encounter (HOSPITAL_COMMUNITY): Payer: Self-pay | Admitting: Vascular Surgery

## 2024-01-09 DIAGNOSIS — N186 End stage renal disease: Secondary | ICD-10-CM

## 2024-01-28 DIAGNOSIS — Z992 Dependence on renal dialysis: Secondary | ICD-10-CM | POA: Diagnosis not present

## 2024-01-28 DIAGNOSIS — N186 End stage renal disease: Secondary | ICD-10-CM | POA: Diagnosis not present

## 2024-01-28 DIAGNOSIS — N041 Nephrotic syndrome with focal and segmental glomerular lesions: Secondary | ICD-10-CM | POA: Diagnosis not present

## 2024-01-30 ENCOUNTER — Encounter: Payer: Self-pay | Admitting: Family Medicine

## 2024-01-30 ENCOUNTER — Encounter (HOSPITAL_COMMUNITY): Payer: Self-pay | Admitting: Vascular Surgery

## 2024-01-30 ENCOUNTER — Other Ambulatory Visit: Payer: Self-pay

## 2024-01-30 ENCOUNTER — Ambulatory Visit: Admitting: Family Medicine

## 2024-01-30 VITALS — BP 112/68 | HR 64 | Temp 98.2°F | Ht 68.0 in | Wt 192.4 lb

## 2024-01-30 DIAGNOSIS — Z1211 Encounter for screening for malignant neoplasm of colon: Secondary | ICD-10-CM

## 2024-01-30 DIAGNOSIS — Z0000 Encounter for general adult medical examination without abnormal findings: Secondary | ICD-10-CM

## 2024-01-30 DIAGNOSIS — E782 Mixed hyperlipidemia: Secondary | ICD-10-CM | POA: Diagnosis not present

## 2024-01-30 DIAGNOSIS — Z131 Encounter for screening for diabetes mellitus: Secondary | ICD-10-CM

## 2024-01-30 DIAGNOSIS — Z125 Encounter for screening for malignant neoplasm of prostate: Secondary | ICD-10-CM

## 2024-01-30 DIAGNOSIS — E663 Overweight: Secondary | ICD-10-CM

## 2024-01-30 NOTE — Progress Notes (Signed)
 SDW CALL  Patient was given pre-op  instructions over the phone. The opportunity was given for the patient to ask questions. No further questions asked. Patient verbalized understanding of instructions given.   PCP - Garnette Katrinka COME Cardiologist - Lonni Verlin COME  PPM/ICD - denies Device Orders -  Rep Notified -   Chest x-ray - na EKG - 06/05/23 Stress Test - Exercise tolerance test-07/12/23, 02/09/23 nuclear med stress test ECHO - 01/19/22 Cardiac Cath - 06/09/23  Sleep Study - 12/17/10 CPAP - yes  Fasting Blood Sugar - na Checks Blood Sugar _____ times a day  Blood Thinner Instructions:na Aspirin  Instructions:continue  ERAS Protcol -NPO PRE-SURGERY Ensure or G2-   COVID TEST- na   Anesthesia review: yes- hx CAD, aortic stenosis, TAVR,HTN,Afib,SDH,craniotomy. Flagged as difficult airway. Previously reviewed by Isaiah Ruder PA-C 07/28/23.   Patient denies shortness of breath, fever, cough and chest pain over the phone call    Special instructions:    Oral Hygiene is also important to reduce your risk of infection.  Remember - BRUSH YOUR TEETH THE MORNING OF SURGERY WITH YOUR REGULAR TOOTHPASTE

## 2024-01-30 NOTE — Progress Notes (Signed)
 Phone: [phone]   Subjective:  Patient presents today for their annual physical. Chief complaint-noted.   See problem oriented charting- ROS- full  review of systems was completed and negative  except for topics noted under acute/chronic concerns  The following were reviewed and entered/updated in epic: Past Medical History:  Diagnosis Date   Anemia    Anemia in chronic kidney disease 03/20/2020   Bicuspid aortic valve    Blood transfusion without reported diagnosis    CAD (coronary artery disease)    proximal LAD 50%, mid LAD 50-60%, mid to distal LAD 99%; mid circumflex 20%; proximal RCA 20%, distal RCA 50%; PDA 60-70%, proximal posterior AV groove 50%, proximal posterior lateral 50%.  There was a question of possible LVOT gradient;  s/p Resolute DES to mid-distal LAD 10/2010;  echocardiogram 11/17/10: EF 55-60%, mild LVH, grade 1 diastolic dysfunction, normal aortic valve, mild MR, PASP 32.    Chicken pox    Dysrhythmia    A. Fib   Enlarged aorta    ESRD on peritoneal dialysis ([name])    GERD (gastroesophageal reflux disease)    Glaucoma    Gout    allopurinol  300mg , colchicine  prn in past, has not had flares   Headache    History of kidney stones    HTN (hypertension) 12/06/2010   Lisinopril  20mg ,  amlodipine  5mg . Home cuff runs high- likely 20/10 higher at least.      HCTZ- stopped by Dr. Douglass     Hypercholesterolemia    With hypertriglyceridemia   Hypertension    Mild aortic stenosis    PAF (paroxysmal atrial fibrillation) ([name])    a. Documented on event monitor 01/2016.   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 03/20/2020    Priority: High   Dilated aortic root 12/14/2017    Priority: High   Paroxysmal atrial fibrillation ([name]) 03/02/2016    Priority: High   Branch retinal artery occlusion 03/01/2016    Priority: High   CAD (coronary artery disease) 12/06/2010    Priority: High   Anemia in chronic kidney disease 03/20/2020    Priority: Medium    Secondary hyperparathyroidism of renal origin 03/20/2020    Priority: Medium    Gout     Priority: Medium    OSA (obstructive sleep apnea) 12/06/2010    Priority: Medium    Mixed hyperlipidemia 12/06/2010    Priority: Medium    Bradycardia 12/06/2010    Priority: Medium    History of urinary stone 03/20/2020    Priority: Low   Carotid bruit 02/09/2012    Priority: Low   Skull defect 06/17/2022   Postoperative CSF leak 06/17/2022   Status post craniectomy 02/25/2022   S/P craniotomy 10/05/2021   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   Past Surgical History:  Procedure Laterality Date   A/V FISTULAGRAM Left 01/08/2024   Procedure: A/V Fistulagram;  Surgeon: Magda Ned  N, MD;  Location: [facility];  Service: Cardiovascular;  Laterality: Left;   AV FISTULA PLACEMENT Left 08/01/2023   Procedure: LEFT UPPER ARM BRACHIOCEPHALIC ARTERIOVENOUS (AV) FISTULA CREATION;  Surgeon: Sheree Penne Bruckner, MD;  Location: [facility] OR;  Service: Vascular;  Laterality: Left;   CAPD REMOVAL Left 08/01/2023   Procedure: CONTINUOUS AMBULATORY PERITONEAL DIALYSIS  (CAPD) CATHETER REMOVAL;  Surgeon: Sheree Penne Bruckner, MD;  Location: [facility] OR;  Service: Vascular;  Laterality: Left;   COLONOSCOPY  2018   CORONARY STENT PLACEMENT  02/28/2010   CRANIOPLASTY N/A 02/25/2022   Procedure: Cranioplasty with custom plate;  Surgeon: Gillie Duncans, MD;  Location: [facility]  OR;  Service: Neurosurgery;  Laterality: N/A;   CRANIOPLASTY N/A 06/17/2022   Procedure: REVISION OF CRANIOPLASTY/PLACEMENT OF LUMBAR DRAIN;  Surgeon: Gillie Duncans, MD;  Location: [facility] OR;  Service: Neurosurgery;  Laterality: N/A;   CRANIOTOMY Left 10/05/2021   Procedure: CRANIOTOMY HEMATOMA EVACUATION SUBDURAL;  Surgeon: Gillie Duncans, MD;  Location: [facility] OR;  Service: Neurosurgery;  Laterality: Left;   CRANIOTOMY Left 10/07/2021   Procedure: Craniectomy for Recurrent  Intracranial Hematoma;  Surgeon: Gillie Duncans, MD;  Location: [facility] OR;  Service: Neurosurgery;  Laterality: Left;   CYSTOSCOPY/RETROGRADE/URETEROSCOPY  03/26/2012   Procedure: CYSTOSCOPY/RETROGRADE/URETEROSCOPY;  Surgeon: Oneil JAYSON Rafter, MD;  Location: [facility];  Service: Urology;  Laterality: Bilateral;  CYSTOSCOPY BILATERAL RETROGRADE PYELOGRAM AND POSSIBLE URETEROSCOPY   DIALYSIS/PERMA CATHETER INSERTION Right 06/22/2023   Procedure: DIALYSIS/PERMA CATHETER INSERTION;  Surgeon: Melia Lynwood ORN, MD;  Location: [facility] INVASIVE CV LAB;  Service: Cardiovascular;  Laterality: Right;   HERNIA REPAIR  2021   Umbilical Repair   INTRAOPERATIVE TRANSTHORACIC ECHOCARDIOGRAM N/A 01/19/2021   Procedure: INTRAOPERATIVE TRANSTHORACIC ECHOCARDIOGRAM;  Surgeon: Verlin Bruckner BIRCH, MD;  Location: [facility] OR;  Service: Open Heart Surgery;  Laterality: N/A;   IR ANGIO EXTERNAL CAROTID SEL EXT CAROTID UNI L MOD SED  09/28/2021   IR ANGIO INTRA EXTRACRAN SEL INTERNAL CAROTID UNI L MOD SED  09/28/2021   IR ANGIOGRAM FOLLOW UP STUDY  09/28/2021   IR ANGIOGRAM FOLLOW UP STUDY  09/30/2021   IR NEURO EACH ADD'L AFTER BASIC UNI LEFT (MS)  09/28/2021   IR NEURO EACH ADD'L AFTER BASIC UNI RIGHT (MS)  09/30/2021   IR TRANSCATH/EMBOLIZ  09/28/2021   LEFT HEART CATH AND CORONARY ANGIOGRAPHY N/A 06/09/2023   Procedure: LEFT HEART CATH AND CORONARY ANGIOGRAPHY;  Surgeon: Verlin Bruckner BIRCH, MD;  Location: [facility] INVASIVE CV LAB;  Service: Cardiovascular;   Laterality: N/A;   PLACEMENT OF LUMBAR DRAIN N/A 06/17/2022   Procedure: PLACEMENT OF LUMBAR DRAIN;  Surgeon: Gillie Duncans, MD;  Location: [facility] OR;  Service: Neurosurgery;  Laterality: N/A;   RADIOLOGY WITH ANESTHESIA N/A 09/28/2021   Procedure: Middle meningeal artery embolization;  Surgeon: Lanis Pupa, MD;  Location: [facility] OR;  Service: Radiology;  Laterality: N/A;   RIGHT HEART CATH AND CORONARY ANGIOGRAPHY N/A 12/04/2020   Procedure: RIGHT HEART CATH AND CORONARY ANGIOGRAPHY;  Surgeon: Verlin Bruckner BIRCH, MD;  Location: [facility] INVASIVE CV LAB;  Service: Cardiovascular;  Laterality: N/A;   TRANSCATHETER AORTIC VALVE REPLACEMENT, TRANSFEMORAL N/A 01/19/2021   Procedure: TRANSCATHETER AORTIC VALVE REPLACEMENT, TRANSFEMORAL USING A EDWARDS VALVE.;  Surgeon: Verlin Bruckner BIRCH, MD;  Location: [facility] OR;  Service: Open Heart Surgery;  Laterality: N/A;    Family History  Problem Relation Age of Onset   Hypertension Father    CAD Father        CABG 42s   Kidney disease Father  Stage IV   Valvular heart disease Mother        AVR July 2015   Diabetes Paternal Grandmother    Colon cancer Neg Hx     Medications- reviewed and updated Current Outpatient Medications  Medication Sig Dispense Refill   acetaminophen  (TYLENOL ) 500 MG tablet Take 1,000 mg by mouth every 6 (six) hours as needed for mild pain (pain score 1-3) or moderate pain (pain score 4-6).     allopurinol  (ZYLOPRIM ) 300 MG tablet TAKE 1 TABLET BY MOUTH EVERY DAY 90 tablet 2   aspirin  EC 81 MG tablet Take 1 tablet (81 mg total) by mouth daily. Swallow whole.     atorvastatin  (LIPITOR) 40 MG tablet Take 1 tablet (40 mg total) by mouth daily. 90 tablet 2   AURYXIA  1 GM 210 MG(Fe) tablet Take 210 mg by mouth 3 (three) times daily with meals.     Methoxy PEG-Epoetin  Beta (MIRCERA IJ) Inject into the skin as needed.     NEXIUM 20 MG capsule Take 20 mg by mouth daily.     NON FORMULARY Pt uses a cpap nightly      sildenafil (VIAGRA) 100 MG tablet Take 100 mg by mouth daily as needed.     Tenapanor  HCl, CKD, (XPHOZAH ) 30 MG TABS Take 30 mg by mouth daily.     amoxicillin  (AMOXIL ) 500 MG tablet Take FOUR tablets (2000 mg) by mouth ONE HOUR BEFORE ANY DENTAL APPOINTMENT (including cleanings) (Patient not taking: Reported on 01/30/2024) 12 tablet 3   No current facility-administered medications for this visit.    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 Oropharynx normal Neck: no thyromegaly CV: RRR no murmurs rubs or gallops Lungs: CTAB no crackles, wheeze, rhonchi Abdomen: soft/nontender/nondistended/normal bowel sounds. No rebound or guarding.  Ext: no edema Skin: warm, dry Neuro: grossly normal, moves all extremities, PERRLA   Assessment and Plan  63 y.o. male presenting for annual physical.  Health Maintenance counseling: 1. Anticipatory guidance: Patient counseled regarding regular dental exams -q6 months, eye exams -yearly,  avoiding smoking and second hand smoke-none , limiting alcohol to 2 beverages per day -none, no illicit drugs .   2. Risk factor reduction:  Advised patient of need for regular exercise and diet rich and fruits and vegetables to reduce risk of heart attack and stroke.  Exercise- room for improvement- goal would be to gradually work towards 150 minutes a week.   Diet/weight management-was sick earlier in year and weight went down and tried to keep down- reasonably healthy diet. Dry weight for dialysis 86.5 kg Wt Readings from Last 3 Encounters:  01/30/24 192 lb 6.4 oz (87.3 kg)  11/08/23 200 lb 3.2 oz (90.8 kg)  09/13/23 200 lb 3.2  oz (90.8 kg)  3. Immunizations/screenings/ancillary studies- holding off on covid Immunization History  Administered Date(s) Administered   Influenza, Mdck, Trivalent,PF 6+ MOS(egg free) 01/17/2024   Influenza, Quadrivalent, Recombinant, Inj, Pf 12/21/2021   Influenza,inj,Quad PF,6+ Mos 03/24/2020, 12/02/2020   Influenza-Unspecified 01/08/2023   Janssen (J&J) SARS-COV-2 Vaccination 05/22/2019   Measles 09/18/1975   Moderna Sars-Covid-2 Vaccination 02/07/2020   Pfizer Covid-19 Vaccine Bivalent Booster 20yrs & up 06/02/2021   Pneumococcal Conjugate-13 03/24/2020   Pneumococcal Polysaccharide-23 05/26/2020   Rubella 09/18/1975   Td 03/01/2008   Tdap 10/24/2018   Zoster Recombinant(Shingrix) 06/02/2021, 08/02/2021   4. Prostate cancer screening- low risk prior trend- update psa today  . With transition to dialysis and other health issues we have had a break in our checks Lab Results  Component Value Date   PSA 0.92 10/24/2018   PSA 0.88 03/21/2016   PSA 0.97 03/19/2015   5. Colon cancer screening - 12/09/2014 was normal but will need one if [facility] moves forward with transplant. Previously with Dr. Albertus so we referred back today 6. Skin cancer screening- no dermatologist. advised regular sunscreen use. Denies worrisome, changing, or new skin lesions.  7. Smoking associated screening (lung cancer screening, AAA screen 65-75, UA)- never smoker 8. STD screening - only active with wife. Does not need sildenafil at this time  Status of chronic or acute concerns   #labs with dialysis stable CBC, CMP  # Upcoming revision of AV fistula with Dr. 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 Nathanael with [facility]--> later at [facility] upcoming on December 18th -Primary nephrologist is Dr. Marlee -Reports EPO treatments (mircera intermittently last 2-3 weeks ago) as needed for anemia and he also takes aruyxia -Secondary hyperparathyroidism managed with calcitriol  and Sensipar (insurance has to have him drop out of range for 3 months to get this again) - no longer on calcitriol  -is on xphozah    # Paroxysmal atrial fibrillation S: Rate controlled with no medication Anticoagulated with no medication-patient declines with prior subdural hematoma from 09/22/2021 A/P: rate controlled -[facility] has been considered-patient may ask at next visit with cardiology   # CAD # Hyperlipidemia # Aortic stenosis status post TAVR  S:Medication: Atorvastatin  40 mg, aspirin  81 mg -no anticoagulant due to prior subdural hematoma per his strong preference -no chest pain or shortness of breath  Lab Results  Component Value Date   CHOL 151 06/05/2023   HDL 26 (L) 06/05/2023   LDLCALC 85 06/05/2023   LDLDIRECT 70.9 02/10/2012   TRIG 239 (H) 06/05/2023   CHOLHDL 5.8 (H) 06/05/2023  A/P: coronary artery disease asymptomatic continue current medications  Valve replacement doing well  Triglycerides slightly high- update lipid panel- hoping LDL better prefefr under 70  #Gout S: Medication: Allopurinol  300 mg As needed: None  A/P:no flares- continue current medications     #obstructive sleep apnea on CPAP still  Recommended follow up: Return in about 1 year (around 01/29/2025) for physical or sooner if needed.Schedule b4 you leave. Future Appointments  Date Time Provider Department Center  07/30/2024  8:00 AM [facility] ANNUAL WELLNESS VISIT 1 [facility] [place]   Lab/Order associations:NOT fasting- will come back fasting   ICD-10-CM   1.  Preventative health care  Z00.00     2. Screening for prostate cancer  Z12.5 PSA, Medicare  CANCELED: PSA, Medicare    3. Mixed hyperlipidemia  E78.2 Lipid panel    CANCELED: Lipid panel    4. Screening for diabetes mellitus  Z13.1 Hemoglobin A1c    CANCELED: Hemoglobin A1c    5. Overweight  E66.3 Hemoglobin A1c    CANCELED: Hemoglobin A1c    6. Screen for colon cancer  Z12.11 Ambulatory referral to Gastroenterology      No orders of the defined types were placed in this encounter.   Return precautions advised.  Garnette Lukes, MD

## 2024-01-30 NOTE — Patient Instructions (Signed)
 Schedule a lab visit at the check out desk within 2 weeks. Return for future fasting labs meaning nothing but water  after midnight please. Ok to take your medications with water .    Recommended follow up: Return in about 1 year (around 01/29/2025) for physical or sooner if needed.Schedule b4 you leave.

## 2024-01-31 NOTE — Anesthesia Preprocedure Evaluation (Signed)
 Anesthesia Evaluation    Airway        Dental   Pulmonary           Cardiovascular hypertension,      Neuro/Psych    GI/Hepatic   Endo/Other    Renal/GU      Musculoskeletal   Abdominal   Peds  Hematology   Anesthesia Other Findings   Reproductive/Obstetrics                              Anesthesia Physical Anesthesia Plan  ASA:   Anesthesia Plan:    Post-op Pain Management:    Induction:   PONV Risk Score and Plan:   Airway Management Planned:   Additional Equipment:   Intra-op Plan:   Post-operative Plan:   Informed Consent:   Plan Discussed with:   Anesthesia Plan Comments: (PAT note written 01/31/2024 by Finnegan Gatta, PA-C.  )        Anesthesia Quick Evaluation

## 2024-01-31 NOTE — Progress Notes (Addendum)
 Anesthesia Chart Review: SAME DAY WORK-UP  Case: [id number] Date/Time: 02/01/24 0915   Procedures:      REVISON OF ARTERIOVENOUS FISTULA (Left)     LIGATION OF COMPETING BRANCHES OF ARTERIOVENOUS FISTULA (Left)   Anesthesia type: Monitor Anesthesia Care   Diagnosis: ESRD (end stage renal disease) ([name]) [N18.6]   Pre-op  diagnosis: ESRD   [facility]   Surgeons: Magda Debby SAILOR, MD       DISCUSSION: Patient is a 63 year old male scheduled for the above procedure. He recently transitioned from PD to HD and underwent removal of PD catheter and creation of left brachiocephalic AVF on 08/01/2023. Fistulogram on 01/08/2024 showed a large competing branch so revision with side branch ligation planned in hopes AVF can mature and be used for HD. He was using a TD for HD as of 01/08/2024.    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 02/09/23 echo), afib (diagnosed 2017), ESRD (d/t FSGS; PD catheter placed 03/06/20, scheduled for removal 08/07/23 due to malfunction; right internal jugular Palindrome [facility] 06/22/23 & transitioned to HD), anemia, OSA (uses CPAP), glaucoma, right eye retinal artery occlusion (2017), gout, renal cysts (03/26/21 MRI), left SDH (09/22/21; s/p Onyx embolization of left middle meningeal artery 09/28/21 with acute/subacute SDH 10/05/21 requiring craniotomy for left SDH evacuation and craniectomy for right epidural/SDH 10/07/21; cranioplasty with custom plate 87/70/76; revision cranioplasty, placement of lumbar drain for CSF leak 06/17/22). Seizure like activity while remaining lucid 04/21/23 during home PD, felt secondary to hypocalcemia.    06/17/2023 Intubation records documented DIFFICULT AIRWAY, although intubation records does not indicate reasons why. Per note, IV induction, MAC and 4 used, Grade II view, 7.5 mm ETT placed after one  attempt. Intubation performed by SRNA. I did not otherwise see a history of difficult intubation.   Last cardiology office visit with Dr. Verlin was on 06/05/23. Afib rate controlled. Previously not on b-blocker due to bradycardia. Off Eliquis  due to SDH requiring craniotomy. 02/09/23 TTE showed LVEF 55-60%, well seated TAVR with trace paravalvular regurgitation, 44 mm ascending aorta. No chest pain, but 02/09/23 nuclear stress test at [facility] (as part of transplant evaluation) suggested anterior ischemia.  A LHC was done on 06/09/23 which showed stable CAD (30% OM1, 50% prox-mid LAD), 20% & 50% distal LAD, 40% prox-mid CX, 70% RCA), so continued medical therapy was recommended. Transplant nephrologist subsequently requested an ETT which was done on 07/12/23 and showed no ECG evidence of ischemia with exercise > 4 METS. One year office follow-up planned.    He is for labs on arrival. Anesthesia team to evaluate on the day of surgery.  VS:  Wt Readings from Last 3 Encounters:  01/30/24 87.3 kg  11/08/23 90.8 kg  09/13/23 90.8 kg   BP Readings from Last 3 Encounters:  01/30/24 112/68  01/08/24 130/70  09/13/23 (!) 150/86   Pulse Readings from Last 3 Encounters:  01/30/24 64  01/08/24 65  09/13/23 73     PROVIDERS: Katrinka Garnette KIDD, MD is PCP  - Verlin Bruckner, MD is structural heart cardiologist - Nathanael Grass, MD is pulmonologist. Initial consult 04/05/2023 for ground glass pulmonology nodules, OSA, pretransplant evaluation. He had recent normal PFTS and serum testing for atypical infections and was a never smoker, so cleared for renal transplant at that time from a pulmonary standpoint; However, he was ultimately turned  down by [facility] due to reduced exercise capacity, borderline weight, and cumulative high risk with neurosurgical history. At 10/03/2023 follow-up,  Patient indicated he would being referred to [facility] for a second renal transplant evaluation. As needed pulmonology  follow-up recommended.  GLENWOOD Marlee Motto, MD is nephrologist is with [facility]. He is undergoing Renal Transplant evaluation at [facility]. GLENWOOD Gillie Duncans, MD is neurosurgeon   LABS: For day of surgery. As of 01/02/2024 HGB 10.    OTHER: PFTs 02/09/23 ([facility]): FVC 4.27 (117%). FEV1 3.55  (123.6%). DLCO unc 18.1 (74.8%).  Quality of Spirometry  Grade AA  Spirometry  Normal.   Lung Volumes  Normal.   Diffusing Capacity  Normal.   Flow-Volume Loop  Flow-volume loop suggests variable extra thoracic obstruction.   Summary  Normal spirometry. Normal lung volumes. Normal  diffusing capacity. Flow-volume loop suggests variable extra thoracic obstruction. Clinical correlation advised.      SIX-MINUTE WALK TEST 02/09/23 ([facility]): Patient was able to walk 449 meters  1481.7 feet .  This is 95 % predicted.  Dyspnea on the BORG scale was light.  Pretest pulse rate was 83 and pulse oximetry was 100%.  Posttest pulse rate was 85 and pulse oximetry was 100%. The results of the six minute walk test indicate normal exercise capacity.    Home Sleep Study 12/17/10: IMPRESSIONS: 1.  Severe obstructive sleep apnea/hypopnea syndrome with AHI 45/h and oxygen desaturation as low as 77%. 2.  Bradycardia noted throughout.     IMAGES: MRI Abd 04/24/23 ([facility]): IMPRESSION: Findings compatible with a hemorrhagic/proteinaceous left renal cyst, within sensitivity of a noncontrast exam.    CT Head 04/21/23: IMPRESSION: 1. No acute intracranial abnormality or significant interval change. 2. Previous left craniectomy and cranioplasty. 3. Progressive thickening and calcification of the dura. 4. Increased CSF density extradural collection on the left. No significant extracranial fluid collection is present as has been seen in the past. This does not create any mass effect. Consider short-term follow-up CT to assess stability of this collection in 2-3 months unless the patient has additional  seizures or other symptoms.   1V PCXR 04/21/23: FINDINGS: Normal lung volumes. Bibasilar patchy opacities. No pleural effusion or pneumothorax. Similar cardiomediastinal silhouette status post aortic valve replacement. No radiographic finding of acute displaced fracture. IMPRESSION: Bibasilar patchy opacities, which may reflect atelectasis, aspiration, or pneumonia.   CT Chest 03/14/23 ([facility]): IMPRESSION: 1.  No acute findings in the chest.  2.  Ground glass nodule in the right middle lobe, potentially infectious or inflammatory.  3.  Additional scattered calcified granulomata and sub-6 mm pulmonary micronodules. No new concerning nodules.        EKG: 06/05/23: Atrial fibrillation at 90 bpm Nonspecific ST abnormality Confirmed by Verlin Bruckner [phone]) on 06/05/2023 8:58:09 AM       CV: ETT 07/12/23:   Baseline ECG with atrial fibrillation under good rate control.   Patient was able to exercise greater than 4 METS of activity (4.8 METS) with no anginal symptoms.  Low risk overall exercise treadmill test with no electrocardiographic evidence of ischemia.  No ST segment deviation.  Recent cardiac catheterization reviewed demonstrating no obstructive coronary artery disease.     Cardiac cath 06/09/23:   1st Mrg lesion is 30% stenosed.   Prox LAD to Mid LAD lesion is 50% stenosed.   Mid LAD lesion is 50% stenosed.   Dist LAD-1 lesion is 20% stenosed.   Dist LAD-2 lesion is 50% stenosed.  Prox Cx to Mid Cx lesion is 40% stenosed.   Dist RCA lesion is 70% stenosed.   Stable CAD without change since cath in 2022 The LAD has moderate diffuse disease in the proximal, mid and distal vessel. Patent mid to distal vessel stented segment with minimal restenosis Large caliber Circumflex artery with minimal plaque Large dominant RCA with moderate disease at the bifurcation of the moderate caliber posterolateral artery and small caliber PDA. Unchanged from last cath in 2022.   Normal LV filling pressure   Recommendations: Medical management of CAD. He can be listed on the renal transplant list from a cardiac perspective.      Echo 02/09/23 ([facility]): SUMMARY  The left ventricular size is normal.  Mild left ventricular hypertrophy.  Left ventricular systolic function is normal.  LV ejection fraction = 55-60%.  The right ventricle is normal in size and function.  The left atrium is moderately to severely dilated.  Status post TAVR appears well seated with trace paravalvular regurgitation,  The aortic sinus is normal size.  Mildly dilated ascending aorta  ~4.4 cm .  The IVC is normal in size with an inspiratory collapse of greater than 50%, suggesting normal right  atrial pressure.  There is no pericardial effusion.  Compared with prior TTE, s-p TAVR      US  Carotid 06/08/21 ([facility]): Not viewable in Care Everywhere. Last available results are from 01/28/16: 1 to 39% bilateral ICA stenosis, normal subclavian arteries bilaterally, patent bilateral vertebral arteries with antegrade flow, with abnormal waveforms in the right vertebral artery.      Cardiac event monitor 01/28/16: Sinus rhythm with premature atrial contractions Atrial fibrillation with controlled heart rate      Past Medical History:  Diagnosis Date   Anemia    Anemia in chronic kidney disease 03/20/2020   Bicuspid aortic valve    Blood transfusion without reported diagnosis    CAD (coronary artery disease)    proximal LAD 50%, mid LAD 50-60%, mid to distal LAD 99%; mid circumflex 20%; proximal RCA 20%, distal RCA 50%; PDA 60-70%, proximal posterior AV groove 50%, proximal posterior lateral 50%.  There was a question of possible LVOT gradient;  s/p Resolute DES to mid-distal LAD 10/2010;  echocardiogram 11/17/10: EF 55-60%, mild LVH, grade 1 diastolic dysfunction, normal aortic valve, mild MR, PASP 32.    Chicken pox    Dysrhythmia    A. Fib   Enlarged aorta    ESRD on peritoneal  dialysis ([name])    GERD (gastroesophageal reflux disease)    Glaucoma    Gout    allopurinol  300mg , colchicine  prn in past, has not had flares   Headache    History of kidney stones    HTN (hypertension) 12/06/2010   Lisinopril  20mg ,  amlodipine  5mg . Home cuff runs high- likely 20/10 higher at least.      HCTZ- stopped by Dr. Douglass     Hypercholesterolemia    With hypertriglyceridemia   Hypertension    Mild aortic stenosis    PAF (paroxysmal atrial fibrillation) ([name])    a. Documented on event monitor 01/2016.   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   SDH (subdural hematoma) ([name]) 08/2021   left   Sleep apnea    cpap nightly    Past Surgical History:  Procedure Laterality Date   A/V  FISTULAGRAM Left 01/08/2024   Procedure: A/V Fistulagram;  Surgeon: Magda Debby SAILOR, MD;  Location: [facility];  Service: Cardiovascular;  Laterality: Left;   AV FISTULA PLACEMENT Left 08/01/2023   Procedure: LEFT UPPER ARM BRACHIOCEPHALIC ARTERIOVENOUS (AV) FISTULA CREATION;  Surgeon: Sheree Penne Bruckner, MD;  Location: [facility] OR;  Service: Vascular;  Laterality: Left;   CAPD REMOVAL Left 08/01/2023   Procedure: CONTINUOUS AMBULATORY PERITONEAL DIALYSIS  (CAPD) CATHETER REMOVAL;  Surgeon: Sheree Penne Bruckner, MD;  Location: [facility] OR;  Service: Vascular;  Laterality: Left;   COLONOSCOPY  2018   CORONARY STENT PLACEMENT  02/28/2010   CRANIOPLASTY N/A 02/25/2022   Procedure: Cranioplasty with custom plate;  Surgeon: Gillie Duncans, MD;  Location: [facility];  Service: Neurosurgery;  Laterality: N/A;   CRANIOPLASTY N/A 06/17/2022   Procedure: REVISION OF CRANIOPLASTY/PLACEMENT OF LUMBAR DRAIN;  Surgeon: Gillie Duncans, MD;  Location: [facility];  Service: Neurosurgery;  Laterality: N/A;   CRANIOTOMY Left 10/05/2021   Procedure: CRANIOTOMY HEMATOMA EVACUATION SUBDURAL;   Surgeon: Gillie Duncans, MD;  Location: [facility];  Service: Neurosurgery;  Laterality: Left;   CRANIOTOMY Left 10/07/2021   Procedure: Craniectomy for Recurrent  Intracranial Hematoma;  Surgeon: Gillie Duncans, MD;  Location[facility] OR;  Service: Neurosurgery;  Laterality: Left;   CYSTOSCOPY/RETROGRADE/URETEROSCOPY  03/26/2012   Procedure: CYSTOSCOPY/RETROGRADE/URETEROSCOPY;  Surgeon: Oneil JAYSON Rafter, MD;  Location: [facility];  Service: Urology;  Laterality: Bilateral;  CYSTOSCOPY BILATERAL RETROGRADE PYELOGRAM AND POSSIBLE URETEROSCOPY   DIALYSIS/PERMA CATHETER INSERTION Right 06/22/2023   Procedure: DIALYSIS/PERMA CATHETER INSERTION;  Surgeon: Melia Lynwood ORN, MD;  Location: [facility] INVASIVE CV LAB;  Service: Cardiovascular;  Laterality: Right;   HERNIA REPAIR  2021   Umbilical Repair   INTRAOPERATIVE TRANSTHORACIC ECHOCARDIOGRAM N/A 01/19/2021   Procedure: INTRAOPERATIVE TRANSTHORACIC ECHOCARDIOGRAM;  Surgeon: Verlin Bruckner BIRCH, MD;  Location: [facility] OR;  Service: Open Heart Surgery;  Laterality: N/A;   IR ANGIO EXTERNAL CAROTID SEL EXT CAROTID UNI L MOD SED  09/28/2021   IR ANGIO INTRA EXTRACRAN SEL INTERNAL CAROTID UNI L MOD SED  09/28/2021   IR ANGIOGRAM FOLLOW UP STUDY  09/28/2021   IR ANGIOGRAM FOLLOW UP STUDY  09/30/2021   IR NEURO EACH ADD'L AFTER BASIC UNI LEFT (MS)  09/28/2021   IR NEURO EACH ADD'L AFTER BASIC UNI RIGHT (MS)  09/30/2021   IR TRANSCATH/EMBOLIZ  09/28/2021   LEFT HEART CATH AND CORONARY ANGIOGRAPHY N/A 06/09/2023   Procedure: LEFT HEART CATH AND CORONARY ANGIOGRAPHY;  Surgeon: Verlin Bruckner BIRCH, MD;  [facility] INVAS[facility] CV LAB;  Service: Cardiovascular;  Laterality: N/A;   PLACEMENT OF LUMBAR DRAIN N/A 06/17/2022   Procedure: PLACEMENT OF LUMBAR DRAIN;  Surgeon: Gillie Duncans, MD;  Location[facility];  Service: Neurosurgery;  Laterality: N/A;   RADIOLOGY WITH ANESTHESIA N/A 09/28/2021   Procedure: Middle meningeal artery embolization;  Surgeon: Lanis Pupa,  MD;  Location: [facility] OR;  Service: Radiology;  Laterality: N/A;   RIGHT HEART CATH AND CORONARY ANGIOGRAPHY N/A 12/04/2020   Procedure: RIGHT HEART CATH AND CORONARY ANGIOGRAPHY;  Surgeon: Verlin Bruckner BIRCH, MD;  [facility] INVAS[facility] CV LAB;  Service: Cardiovascular;  Laterality: N/A;   TRANSCATHETER AORTIC VALVE REPLACEMENT, TRANSFEMORAL N/A 01/19/2021   Procedure: TRANSCATHETER AORTIC VALVE REPLACEMENT, TRANSFEMORAL USING A EDWARDS VALVE.;  Surgeon: Verlin Bruckner BIRCH, MD;  Location: [facility];  Service: Open Heart Surgery;  Laterality: N/A;    MEDICATIONS: No current facility-administered medications for this encounter.    acetaminophen  (TYLENOL ) 500 MG tablet   allopurinol  (  ZYLOPRIM ) 300 MG tablet   aspirin  EC 81 MG tablet   atorvastatin  (LIPITOR) 40 MG tablet   AURYXIA  1 GM 210 MG(Fe) tablet   Methoxy PEG-Epoetin  Beta (MIRCERA IJ)   NEXIUM 20 MG capsule   sildenafil (VIAGRA) 100 MG tablet   Tenapanor  HCl, CKD, (XPHOZAH ) 30 MG TABS   amoxicillin  (AMOXIL ) 500 MG tablet   NON FORMULARY    Isaiah Ruder, PA-C Surgical Short Stay/Anesthesiology [facility] [place] Phone [phone] [facility] Phone [phone] 01/31/2024 3:05 PM

## 2024-02-01 ENCOUNTER — Encounter (HOSPITAL_COMMUNITY): Payer: Self-pay | Admitting: Vascular Surgery

## 2024-02-01 ENCOUNTER — Other Ambulatory Visit: Payer: Self-pay | Admitting: Vascular Surgery

## 2024-02-01 ENCOUNTER — Other Ambulatory Visit (HOSPITAL_COMMUNITY): Payer: Self-pay

## 2024-02-01 ENCOUNTER — Other Ambulatory Visit: Payer: Self-pay

## 2024-02-01 ENCOUNTER — Encounter (HOSPITAL_COMMUNITY)
Admission: RE | Disposition: A | Discharge status: Home / Self Care | Payer: Self-pay | Source: Home / Self Care | Attending: Vascular Surgery

## 2024-02-01 ENCOUNTER — Ambulatory Visit (HOSPITAL_COMMUNITY): Payer: Self-pay | Admitting: Vascular Surgery

## 2024-02-01 ENCOUNTER — Ambulatory Visit (HOSPITAL_COMMUNITY)
Admission: RE | Admit: 2024-02-01 | Discharge: 2024-02-01 | Disposition: A | Discharge status: Home / Self Care | Attending: Vascular Surgery | Admitting: Vascular Surgery

## 2024-02-01 DIAGNOSIS — I120 Hypertensive chronic kidney disease with stage 5 chronic kidney disease or end stage renal disease: Secondary | ICD-10-CM | POA: Diagnosis not present

## 2024-02-01 DIAGNOSIS — I2510 Atherosclerotic heart disease of native coronary artery without angina pectoris: Secondary | ICD-10-CM | POA: Diagnosis not present

## 2024-02-01 DIAGNOSIS — N186 End stage renal disease: Secondary | ICD-10-CM | POA: Diagnosis present

## 2024-02-01 DIAGNOSIS — T82590A Other mechanical complication of surgically created arteriovenous fistula, initial encounter: Secondary | ICD-10-CM

## 2024-02-01 DIAGNOSIS — Z992 Dependence on renal dialysis: Secondary | ICD-10-CM | POA: Diagnosis not present

## 2024-02-01 DIAGNOSIS — I480 Paroxysmal atrial fibrillation: Secondary | ICD-10-CM | POA: Diagnosis not present

## 2024-02-01 HISTORY — DX: Gastro-esophageal reflux disease without esophagitis: K21.9

## 2024-02-01 HISTORY — PX: LIGATION OF COMPETING BRANCHES OF ARTERIOVENOUS FISTULA: SHX5949

## 2024-02-01 HISTORY — PX: REVISON OF ARTERIOVENOUS FISTULA: SHX6074

## 2024-02-01 LAB — POCT I-STAT, CHEM 8
BUN: 45 mg/dL — ABNORMAL HIGH (ref 8–23)
Calcium, Ion: 1.27 mmol/L (ref 1.15–1.40)
Chloride: 97 mmol/L — ABNORMAL LOW (ref 98–111)
Creatinine, Ser: 6.6 mg/dL — ABNORMAL HIGH (ref 0.61–1.24)
Glucose, Bld: 92 mg/dL (ref 70–99)
HCT: 38 % — ABNORMAL LOW (ref 39.0–52.0)
Hemoglobin: 12.9 g/dL — ABNORMAL LOW (ref 13.0–17.0)
Potassium: 4.1 mmol/L (ref 3.5–5.1)
Sodium: 135 mmol/L (ref 135–145)
TCO2: 28 mmol/L (ref 22–32)

## 2024-02-01 SURGERY — REVISON OF ARTERIOVENOUS FISTULA
Anesthesia: Regional | Laterality: Left

## 2024-02-01 MED ORDER — PHENYLEPHRINE 80 MCG/ML (10ML) SYRINGE FOR IV PUSH (FOR BLOOD PRESSURE SUPPORT)
PREFILLED_SYRINGE | INTRAVENOUS | Status: DC | PRN
  Administered 2024-02-01: 80 ug via INTRAVENOUS

## 2024-02-01 MED ORDER — PROPOFOL 10 MG/ML IV BOLUS
INTRAVENOUS | Status: DC | PRN
  Administered 2024-02-01 (×2): 30 mg via INTRAVENOUS
  Administered 2024-02-01: 20 mg via INTRAVENOUS

## 2024-02-01 MED ORDER — SODIUM CHLORIDE 0.9 % IV SOLN: INTRAVENOUS | Status: DC

## 2024-02-01 MED ORDER — PROPOFOL 10 MG/ML IV BOLUS
INTRAVENOUS | Status: AC
  Filled 2024-02-01: qty 20

## 2024-02-01 MED ORDER — HYDROCODONE-ACETAMINOPHEN 5-325 MG PO TABS
1.0000 | ORAL_TABLET | Freq: Four times a day (QID) | ORAL | 0 refills | Status: AC | PRN
  Filled 2024-02-01: qty 10, 3d supply, fill #0

## 2024-02-01 MED ORDER — CHLORHEXIDINE GLUCONATE 0.12 % MT SOLN: 15.0000 mL | Freq: Once | OROMUCOSAL | Status: AC

## 2024-02-01 MED ORDER — HEPARIN 6000 UNIT IRRIGATION SOLUTION
Status: AC
  Filled 2024-02-01: qty 500

## 2024-02-01 MED ORDER — DEXMEDETOMIDINE HCL IN NACL 80 MCG/20ML IV SOLN
INTRAVENOUS | Status: DC | PRN
  Administered 2024-02-01: 4 ug via INTRAVENOUS

## 2024-02-01 MED ORDER — FENTANYL CITRATE (PF) 100 MCG/2ML IJ SOLN: 25.0000 ug | INTRAMUSCULAR | Status: DC | PRN

## 2024-02-01 MED ORDER — MIDAZOLAM HCL 2 MG/2ML IJ SOLN
INTRAMUSCULAR | Status: AC
  Administered 2024-02-01: 1 mg via INTRAVENOUS
  Filled 2024-02-01: qty 2

## 2024-02-01 MED ORDER — ORAL CARE MOUTH RINSE: 15.0000 mL | Freq: Once | OROMUCOSAL | Status: AC

## 2024-02-01 MED ORDER — SODIUM CHLORIDE 0.9 % IV SOLN: 12.5000 mg | INTRAVENOUS | Status: DC | PRN

## 2024-02-01 MED ORDER — FENTANYL CITRATE (PF) 100 MCG/2ML IJ SOLN: 50.0000 ug | Freq: Once | INTRAMUSCULAR | Status: AC

## 2024-02-01 MED ORDER — PROPOFOL 500 MG/50ML IV EMUL
INTRAVENOUS | Status: DC | PRN
  Administered 2024-02-01: 50 ug/kg/min via INTRAVENOUS

## 2024-02-01 MED ORDER — CHLORHEXIDINE GLUCONATE 4 % EX SOLN: 60.0000 mL | Freq: Once | CUTANEOUS | Status: DC

## 2024-02-01 MED ORDER — LIDOCAINE 2% (20 MG/ML) 5 ML SYRINGE
INTRAMUSCULAR | Status: AC
  Filled 2024-02-01: qty 5

## 2024-02-01 MED ORDER — CHLORHEXIDINE GLUCONATE 0.12 % MT SOLN
OROMUCOSAL | Status: AC
  Administered 2024-02-01: 15 mL via OROMUCOSAL
  Filled 2024-02-01: qty 15

## 2024-02-01 MED ORDER — OXYCODONE HCL 5 MG/5ML PO SOLN: 5.0000 mg | Freq: Once | ORAL | Status: DC | PRN

## 2024-02-01 MED ORDER — MEPIVACAINE HCL (PF) 1.5 % IJ SOLN
INTRAMUSCULAR | Status: DC | PRN
  Administered 2024-02-01: 20 mL via PERINEURAL

## 2024-02-01 MED ORDER — CEFAZOLIN SODIUM-DEXTROSE 2-4 GM/100ML-% IV SOLN
INTRAVENOUS | Status: AC
  Filled 2024-02-01: qty 100

## 2024-02-01 MED ORDER — AMISULPRIDE (ANTIEMETIC) 5 MG/2ML IV SOLN: 10.0000 mg | Freq: Once | INTRAVENOUS | Status: DC | PRN

## 2024-02-01 MED ORDER — HEPARIN 6000 UNIT IRRIGATION SOLUTION
Status: DC | PRN
  Administered 2024-02-01: 1

## 2024-02-01 MED ORDER — ACETAMINOPHEN 500 MG PO TABS
1000.0000 mg | ORAL_TABLET | Freq: Once | ORAL | Status: AC
  Administered 2024-02-01: 1000 mg via ORAL
  Filled 2024-02-01: qty 2

## 2024-02-01 MED ORDER — OXYCODONE HCL 5 MG PO TABS: 5.0000 mg | ORAL_TABLET | Freq: Once | ORAL | Status: DC | PRN

## 2024-02-01 MED ORDER — ONDANSETRON HCL 4 MG/2ML IJ SOLN
INTRAMUSCULAR | Status: DC | PRN
  Administered 2024-02-01: 4 mg via INTRAVENOUS

## 2024-02-01 MED ORDER — PHENYLEPHRINE HCL-NACL 20-0.9 MG/250ML-% IV SOLN
INTRAVENOUS | Status: DC | PRN
  Administered 2024-02-01: 20 ug/min via INTRAVENOUS

## 2024-02-01 MED ORDER — FENTANYL CITRATE (PF) 100 MCG/2ML IJ SOLN
INTRAMUSCULAR | Status: AC
  Administered 2024-02-01: 50 ug via INTRAVENOUS
  Filled 2024-02-01: qty 2

## 2024-02-01 MED ORDER — MIDAZOLAM HCL (PF) 2 MG/2ML IJ SOLN: 1.0000 mg | Freq: Once | INTRAMUSCULAR | Status: AC

## 2024-02-01 MED ORDER — DEXAMETHASONE SOD PHOSPHATE PF 10 MG/ML IJ SOLN
INTRAMUSCULAR | Status: DC | PRN
  Administered 2024-02-01: 5 mg via INTRAVENOUS

## 2024-02-01 MED ORDER — CEFAZOLIN SODIUM-DEXTROSE 2-4 GM/100ML-% IV SOLN
2.0000 g | INTRAVENOUS | Status: AC
  Administered 2024-02-01: 2 g via INTRAVENOUS

## 2024-02-01 SURGICAL SUPPLY — 26 items
ARMBAND PINK RESTRICT EXTREMIT (MISCELLANEOUS) ×1 IMPLANT
BAG COUNTER SPONGE SURGICOUNT (BAG) ×1 IMPLANT
BENZOIN TINCTURE PRP APPL 2/3 (GAUZE/BANDAGES/DRESSINGS) ×1 IMPLANT
CANISTER SUCTION 3000ML PPV (SUCTIONS) ×1 IMPLANT
CANNULA VESSEL 3MM 2 BLNT TIP (CANNULA) ×1 IMPLANT
CHLORAPREP W/TINT 26 (MISCELLANEOUS) ×1 IMPLANT
COVER PROBE W GEL 5X96 (DRAPES) IMPLANT
DERMABOND ADVANCED .7 DNX12 (GAUZE/BANDAGES/DRESSINGS) IMPLANT
ELECTRODE REM PT RTRN 9FT ADLT (ELECTROSURGICAL) ×1 IMPLANT
GLOVE BIO SURGEON STRL SZ8 (GLOVE) ×1 IMPLANT
GOWN STRL REUS W/ TWL LRG LVL3 (GOWN DISPOSABLE) ×2 IMPLANT
GOWN STRL REUS W/ TWL XL LVL3 (GOWN DISPOSABLE) ×1 IMPLANT
KIT BASIN OR (CUSTOM PROCEDURE TRAY) ×1 IMPLANT
KIT TURNOVER KIT B (KITS) ×1 IMPLANT
PACK CV ACCESS (CUSTOM PROCEDURE TRAY) ×1 IMPLANT
PAD ARMBOARD POSITIONER FOAM (MISCELLANEOUS) ×2 IMPLANT
SLING ARM FOAM STRAP LRG (SOFTGOODS) IMPLANT
SOLN 0.9% NACL POUR BTL 1000ML (IV SOLUTION) ×1 IMPLANT
SOLN STERILE WATER BTL 1000 ML (IV SOLUTION) ×1 IMPLANT
STRIP CLOSURE SKIN 1/2X4 (GAUZE/BANDAGES/DRESSINGS) ×1 IMPLANT
SUT MNCRL AB 4-0 PS2 18 (SUTURE) ×1 IMPLANT
SUT PROLENE 6 0 BV (SUTURE) ×1 IMPLANT
SUT VIC AB 3-0 SH 27X BRD (SUTURE) ×1 IMPLANT
SYR 3ML LL SCALE MARK (SYRINGE) ×1 IMPLANT
TOWEL GREEN STERILE (TOWEL DISPOSABLE) ×1 IMPLANT
UNDERPAD 30X36 HEAVY ABSORB (UNDERPADS AND DIAPERS) ×1 IMPLANT

## 2024-02-01 NOTE — Anesthesia Procedure Notes (Signed)
 Procedure Name: MAC Date/Time: 02/01/2024 11:33 AM  Performed by: Erlene Powell POUR, CRNAPre-anesthesia Checklist: Patient identified, Emergency Drugs available, Suction available, Patient being monitored and Timeout performed Patient Re-evaluated:Patient Re-evaluated prior to induction Oxygen Delivery Method: Simple face mask Preoxygenation: Pre-oxygenation with 100% oxygen Induction Type: IV induction Airway Equipment and Method: Oral airway

## 2024-02-01 NOTE — Discharge Instructions (Signed)

## 2024-02-01 NOTE — Transfer of Care (Signed)
 Immediate Anesthesia Transfer of Care Note  Patient: Daniel Solomon.  Procedure(s) Performed: REVISON OF ARTERIOVENOUS FISTULA (Left) LIGATION OF COMPETING BRANCHES OF ARTERIOVENOUS FISTULA TIMES TWO (Left)  Patient Location: PACU  Anesthesia Type:MAC  Level of Consciousness: drowsy  Airway & Oxygen Therapy: Patient Spontanous Breathing and Patient connected to face mask oxygen  Post-op Assessment: Report given to RN, Post -op Vital signs reviewed and stable, and Patient moving all extremities  Post vital signs: Reviewed and stable  Last Vitals:  Vitals Value Taken Time  BP 93/63 02/01/24 12:11  Temp    Pulse 78 02/01/24 12:14  Resp 11 02/01/24 12:14  SpO2 99 % 02/01/24 12:14  Vitals shown include unfiled device data.  Last Pain:  Vitals:   02/01/24 0806  PainSc: 0-No pain         Complications: No notable events documented.

## 2024-02-01 NOTE — Anesthesia Postprocedure Evaluation (Signed)
 Anesthesia Post Note  Patient: Daniel Solomon.  Procedure(s) Performed: REVISON OF ARTERIOVENOUS FISTULA (Left) LIGATION OF COMPETING BRANCHES OF ARTERIOVENOUS FISTULA TIMES TWO (Left)     Patient location during evaluation: PACU Anesthesia Type: Regional Level of consciousness: awake and alert Pain management: pain level controlled Vital Signs Assessment: post-procedure vital signs reviewed and stable Respiratory status: spontaneous breathing, nonlabored ventilation and respiratory function stable Cardiovascular status: stable and blood pressure returned to baseline Anesthetic complications: no   No notable events documented.  Last Vitals:  Vitals:   02/01/24 1230 02/01/24 1245  BP: 105/65 96/61  Pulse: 63 64  Resp: 13 13  Temp:    SpO2: 96% 96%    Last Pain:  Vitals:   02/01/24 1230  PainSc: 0-No pain                 [name] Like

## 2024-02-01 NOTE — Interval H&P Note (Signed)
 History and Physical Interval Note:  02/01/2024 11:07 AM  Daniel Solomon.  has presented today for surgery, with the diagnosis of ESRD.  The various methods of treatment have been discussed with the patient and family. After consideration of risks, benefits and other options for treatment, the patient has consented to  Procedure(s): REVISON OF ARTERIOVENOUS FISTULA (Left) LIGATION OF COMPETING BRANCHES OF ARTERIOVENOUS FISTULA (Left) as a surgical intervention.  The patient's history has been reviewed, patient examined, no change in status, stable for surgery.  I have reviewed the patient's chart and labs.  Questions were answered to the patient's satisfaction.     Debby LOISE Robertson

## 2024-02-01 NOTE — Op Note (Signed)
 DATE OF SERVICE: 02/01/2024  PATIENT:  Daniel Solomon.  63 y.o. male  PRE-OPERATIVE DIAGNOSIS:  ESRD, poorly maturing left brachiocephalic AVF  POST-OPERATIVE DIAGNOSIS:  Same  PROCEDURE:   Left arm arteriovenous fistula revision with sidebranch ligation x 2  SURGEON:  Surgeons and Role:    * Magda Debby SAILOR, MD - Primary  ASSISTANT: Adina Sender, PA-C  An experienced assistant was required given the complexity of this procedure and the standard of surgical care. My assistant helped with exposure through counter tension, suctioning, ligation and retraction to better visualize the surgical field.  My assistant expedited sewing during the case by following my sutures. Wherever I use the term we in the report, my assistant actively helped me with that portion of the procedure.  ANESTHESIA:   regional and MAC  EBL: min  BLOOD ADMINISTERED:none  DRAINS: none   LOCAL MEDICATIONS USED:  NONE  SPECIMEN:  none  COUNTS: confirmed correct.  TOURNIQUET:  none  PATIENT DISPOSITION:  PACU - hemodynamically stable.   Delay start of Pharmacological VTE agent (>24hrs) due to surgical blood loss or risk of bleeding: no  INDICATION FOR PROCEDURE: [name]. is a 63 y.o. male with end-stage renal disease dialyzing through tunneled dialysis catheter.  He had previously had a brachiocephalic AV fistula created which has a large competing sidebranch which is limiting flow through the fistula. After careful discussion of risks, benefits, and alternatives the patient was offered sidebranch ligation. The patient understood and wished to proceed.  OPERATIVE FINDINGS: 2 large sidebranches identified and exposed through small transverse incisions in the upper arm.  The branches were ligated completely with silk ligature.  Augmented thrill in the fistula at completion.  Palpable radial pulse at completion.  DESCRIPTION OF PROCEDURE: After identification of the patient in the pre-operative  holding area, the patient was transferred to the operating room. The patient was positioned supine on the operating room table. Anesthesia was induced. The left arm was prepped and draped in standard fashion. A surgical pause was performed confirming correct patient, procedure, and operative location.  Intraoperative ultrasound was used to mark the large branches in the left brachiocephalic AV fistula.  Small transverse incisions were made over the branch points with a 15 blade and carried down through the subcutaneous tissue until the fistula was encountered and skeletonized.  2-0 silk suture was used to ligate the branch proximally and distally.  In the larger branch, the ligation was reinforced with a medium clip proximally distally.  The branches were divided between ligature.  Hemostasis was confirmed.  Augmented thrill in the fistula was confirmed.  Radial pulse was confirmed.  Satisfied being the case here.  The wounds were closed in layers using 3-0 Vicryl and 4-0 Monocryl.  Upon completion of the case instrument and sharps counts were confirmed correct. The patient was transferred to the PACU in good condition. I was present for all portions of the procedure.  FOLLOW UP PLAN: Assuming a normal postoperative course, VVS PA will see the patient in 6 weeks with AV fistula duplex.   Debby SAILOR. Magda, MD [facility] Phone Number: [phone] 02/01/2024 12:02 PM

## 2024-02-01 NOTE — Anesthesia Procedure Notes (Signed)
 Anesthesia Regional Block: Supraclavicular block   Pre-Anesthetic Checklist: , timeout performed,  Correct Patient, Correct Site, Correct Laterality,  Correct Procedure, Correct Position, site marked,  Risks and benefits discussed,  Surgical consent,  Pre-op  evaluation,  At surgeon's request and post-op pain management  Laterality: Left  Prep: chloraprep       Needles:  Injection technique: Single-shot  Needle Type: Echogenic Needle     Needle Length: 5cm  Needle Gauge: 21     Additional Needles:   Narrative:  Start time: 02/01/2024 10:46 AM End time: 02/01/2024 10:49 AM Injection made incrementally with aspirations every 5 mL.  Performed by: Personally  Anesthesiologist: Lucious Debby BRAVO, MD  Additional Notes: No pain on injection. No increased resistance to injection. Injection made in 5cc increments. Good needle visualization. Patient tolerated the procedure well.

## 2024-02-02 ENCOUNTER — Encounter (HOSPITAL_COMMUNITY): Payer: Self-pay | Admitting: Vascular Surgery

## 2024-02-06 ENCOUNTER — Other Ambulatory Visit

## 2024-02-06 DIAGNOSIS — E782 Mixed hyperlipidemia: Secondary | ICD-10-CM

## 2024-02-06 DIAGNOSIS — Z131 Encounter for screening for diabetes mellitus: Secondary | ICD-10-CM | POA: Diagnosis not present

## 2024-02-06 DIAGNOSIS — Z125 Encounter for screening for malignant neoplasm of prostate: Secondary | ICD-10-CM | POA: Diagnosis not present

## 2024-02-06 DIAGNOSIS — E663 Overweight: Secondary | ICD-10-CM

## 2024-02-07 ENCOUNTER — Ambulatory Visit: Payer: Self-pay | Admitting: Family Medicine

## 2024-02-07 LAB — LIPID PANEL
Cholesterol: 107 mg/dL (ref 0–200)
HDL: 38.7 mg/dL — ABNORMAL LOW (ref >39.00)
LDL Cholesterol: 49 mg/dL (ref 0–99)
NonHDL: 68.76
Total CHOL/HDL Ratio: 3
Triglycerides: 99 mg/dL (ref 0.0–149.0)
VLDL: 19.8 mg/dL (ref 0.0–40.0)

## 2024-02-07 LAB — HEMOGLOBIN A1C: Hgb A1c MFr Bld: 4.7 % (ref 4.6–6.5)

## 2024-02-07 LAB — PSA, MEDICARE: PSA: 0.85 ng/mL (ref 0.10–4.00)

## 2024-02-10 ENCOUNTER — Other Ambulatory Visit: Payer: Self-pay

## 2024-02-10 ENCOUNTER — Encounter (HOSPITAL_COMMUNITY): Payer: Self-pay

## 2024-02-10 ENCOUNTER — Emergency Department (HOSPITAL_COMMUNITY)

## 2024-02-10 ENCOUNTER — Emergency Department (HOSPITAL_COMMUNITY)
Admission: EM | Admit: 2024-02-10 | Discharge: 2024-02-11 | Disposition: A | Discharge status: Home / Self Care | Attending: Emergency Medicine | Admitting: Emergency Medicine

## 2024-02-10 DIAGNOSIS — Z992 Dependence on renal dialysis: Secondary | ICD-10-CM | POA: Diagnosis not present

## 2024-02-10 DIAGNOSIS — I4891 Unspecified atrial fibrillation: Secondary | ICD-10-CM | POA: Insufficient documentation

## 2024-02-10 DIAGNOSIS — N186 End stage renal disease: Secondary | ICD-10-CM | POA: Insufficient documentation

## 2024-02-10 DIAGNOSIS — Z7982 Long term (current) use of aspirin: Secondary | ICD-10-CM | POA: Insufficient documentation

## 2024-02-10 DIAGNOSIS — Z79899 Other long term (current) drug therapy: Secondary | ICD-10-CM | POA: Diagnosis not present

## 2024-02-10 DIAGNOSIS — I2510 Atherosclerotic heart disease of native coronary artery without angina pectoris: Secondary | ICD-10-CM | POA: Insufficient documentation

## 2024-02-10 DIAGNOSIS — R55 Syncope and collapse: Secondary | ICD-10-CM | POA: Insufficient documentation

## 2024-02-10 DIAGNOSIS — R42 Dizziness and giddiness: Secondary | ICD-10-CM | POA: Diagnosis present

## 2024-02-10 LAB — CBC WITH DIFFERENTIAL/PLATELET
Abs Immature Granulocytes: 0.04 K/uL (ref 0.00–0.07)
Basophils Absolute: 0.1 K/uL (ref 0.0–0.1)
Basophils Relative: 1 %
Eosinophils Absolute: 0.2 K/uL (ref 0.0–0.5)
Eosinophils Relative: 2 %
HCT: 35.4 % — ABNORMAL LOW (ref 39.0–52.0)
Hemoglobin: 11.9 g/dL — ABNORMAL LOW (ref 13.0–17.0)
Immature Granulocytes: 1 %
Lymphocytes Relative: 17 %
Lymphs Abs: 1.5 K/uL (ref 0.7–4.0)
MCH: 34.1 pg — ABNORMAL HIGH (ref 26.0–34.0)
MCHC: 33.6 g/dL (ref 30.0–36.0)
MCV: 101.4 fL — ABNORMAL HIGH (ref 80.0–100.0)
Monocytes Absolute: 0.7 K/uL (ref 0.1–1.0)
Monocytes Relative: 8 %
Neutro Abs: 6.3 K/uL (ref 1.7–7.7)
Neutrophils Relative %: 71 %
Platelets: 86 K/uL — ABNORMAL LOW (ref 150–400)
RBC: 3.49 MIL/uL — ABNORMAL LOW (ref 4.22–5.81)
RDW: 14.6 % (ref 11.5–15.5)
WBC: 8.8 K/uL (ref 4.0–10.5)
nRBC: 0 % (ref 0.0–0.2)

## 2024-02-10 LAB — COMPREHENSIVE METABOLIC PANEL WITH GFR
ALT: 10 U/L (ref 0–44)
AST: 20 U/L (ref 15–41)
Albumin: 4.7 g/dL (ref 3.5–5.0)
Alkaline Phosphatase: 110 U/L (ref 38–126)
Anion gap: 18 — ABNORMAL HIGH (ref 5–15)
BUN: 47 mg/dL — ABNORMAL HIGH (ref 8–23)
CO2: 24 mmol/L (ref 22–32)
Calcium: 10.9 mg/dL — ABNORMAL HIGH (ref 8.9–10.3)
Chloride: 92 mmol/L — ABNORMAL LOW (ref 98–111)
Creatinine, Ser: 5.78 mg/dL — ABNORMAL HIGH (ref 0.61–1.24)
GFR, Estimated: 10 mL/min — ABNORMAL LOW (ref >60)
Glucose, Bld: 114 mg/dL — ABNORMAL HIGH (ref 70–99)
Potassium: 3.5 mmol/L (ref 3.5–5.1)
Sodium: 135 mmol/L (ref 135–145)
Total Bilirubin: 0.6 mg/dL (ref 0.0–1.2)
Total Protein: 7.9 g/dL (ref 6.5–8.1)

## 2024-02-10 LAB — CBG MONITORING, ED: Glucose-Capillary: 123 mg/dL — ABNORMAL HIGH (ref 70–99)

## 2024-02-10 NOTE — ED Triage Notes (Signed)
 BIB EMS FROM HOME, EPISODE WITNESSED BY WIFE AND EMS lasting about 30 sec.    Complaints of not feeling well.  Bp:126/68 Hx afib 86-90  Hx hypocalcemia not above 424   20 iv rt forearm 50ml fluid given  Friday, missed dialysis d/t equipment failure; 1 1/2 extra (usually 3 1/2 taken  Cbg: 122

## 2024-02-10 NOTE — ED Provider Notes (Signed)
 [place] EMERGENCY DEPARTMENT AT [facility] Provider Note   CSN: [id number] Arrival date & time: 02/10/24  2309     History Chief Complaint  Patient presents with   Near Syncope    HPI: Daniel Solomon. is a 63 y.o. male with history pertinent for ESRD on IHD at home on MWFS.  OSA, CAD, hyperlipidemia, atrial fibrillation, dilated aortic root, prior TAVR who presents complaining of syncope. Patient arrived via EMS from home.  History provided by patient and EMS.  No interpreter required during this encounter.  Patient reports that just prior to arrival he had finished his home hemodialysis treatment.  Reports that he usually does Monday, Wednesday, Friday, Saturday and has 3 kg of fluid removed.  Reports that he had a machine malfunction yesterday, therefore missed his Friday session of dialysis.  Reports that he was given a replacement hemodialysis machine today and had 4-1/2 kg of fluid removed.  Reports that following this he felt lightheaded and hot, and had an episode of syncope.  Per EMS, wife reported that patient was unconscious for approximately 30 seconds, patient then had spontaneous regaining of consciousness, again experienced feeling lightheaded and hot, and had second episode of syncope.  Did not have any seizure-like activity.  Does report that he has a history of hypocalcemia, and has previously felt lightheaded when he had hypocalcemia.  Denies any associated chest pain, shortness of breath, fevers, chills, nausea, diarrhea, abdominal pain.  He reports that after receiving fluids and route with EMS he now feels back to baseline.  EMS reports that they found the patient to initially be low normotensive on scene, blood pressure 102/palp, received 50 cc of fluid and route with improvement of systolic to 120s.  Patient's recorded medical, surgical, social, medication list and allergies were reviewed in the Snapshot window as part of the initial history.   Prior to  Admission medications  Medication Sig Start Date End Date Taking? Authorizing Provider  acetaminophen  (TYLENOL ) 500 MG tablet Take 1,000 mg by mouth every 6 (six) hours as needed for mild pain (pain score 1-3) or moderate pain (pain score 4-6).    [provider]  allopurinol  (ZYLOPRIM ) 300 MG tablet TAKE 1 TABLET BY MOUTH EVERY DAY 01/04/22   Katrinka Garnette KIDD, MD  amoxicillin  (AMOXIL ) 500 MG tablet Take FOUR tablets (2000 mg) by mouth ONE HOUR BEFORE ANY DENTAL APPOINTMENT (including cleanings) Patient not taking: Reported on 01/30/2024 02/02/23   Verlin Lonni BIRCH, MD  aspirin  EC 81 MG tablet Take 1 tablet (81 mg total) by mouth daily. Swallow whole. 06/05/23   Verlin Lonni BIRCH, MD  atorvastatin  (LIPITOR) 40 MG tablet Take 1 tablet (40 mg total) by mouth daily. 12/11/23   Verlin Lonni BIRCH, MD  AURYXIA  1 GM 210 MG(Fe) tablet Take 210 mg by mouth 3 (three) times daily with meals. 12/23/20   [provider]  HYDROcodone -acetaminophen  (NORCO/VICODIN) 5-325 MG tablet Take 1 tablet by mouth every 6 (six) hours as needed for moderate pain (pain score 4-6). 02/01/24   Bethanie Cough, PA-C  Methoxy PEG-Epoetin  Beta (MIRCERA IJ) Inject into the skin as needed. 06/15/20   [provider]  NEXIUM 20 MG capsule Take 20 mg by mouth daily. 08/18/23   [provider]  NON FORMULARY Pt uses a cpap nightly    [provider]  sildenafil (VIAGRA) 100 MG tablet Take 100 mg by mouth daily as needed. 01/16/24   [provider]  Tenapanor  HCl, CKD, (XPHOZAH )  30 MG TABS Take 30 mg by mouth daily.    [provider]     Allergies: Patient has no known allergies.   Review of Systems   ROS as per HPI  Physical Exam Updated Vital Signs There were no vitals taken for this visit. Physical Exam Vitals and nursing note reviewed.  Constitutional:      General: He is not in acute distress.    Appearance: He is well-developed.  HENT:      Head: Normocephalic and atraumatic.  Eyes:     Conjunctiva/sclera: Conjunctivae normal.  Cardiovascular:     Rate and Rhythm: Normal rate and regular rhythm.     Heart sounds: No murmur heard. Pulmonary:     Effort: Pulmonary effort is normal. No respiratory distress.     Breath sounds: Normal breath sounds.  Chest:     Comments: PermCath in place and right sided chest wall Abdominal:     Palpations: Abdomen is soft.     Tenderness: There is no abdominal tenderness.  Musculoskeletal:        General: No swelling.     Cervical back: Neck supple.  Skin:    General: Skin is warm and dry.     Capillary Refill: Capillary refill takes less than 2 seconds.  Neurological:     Mental Status: He is alert.  Psychiatric:        Mood and Affect: Mood normal.     ED Course/ Medical Decision Making/ A&P    Procedures Procedures   Medications Ordered in ED Medications - No data to display  Medical Decision Making:   Daniel Solomon. is a 63 y.o. male who presents for ***as per above.  Physical exam is pertinent for ***.   The differential includes but is not limited to ***.  Independent historian: {LSHISTORIAN:33403}  External data reviewed: {LSEXTERNALDATA:33407}  Initial Plan:  ***Screening labs including CBC and Metabolic panel to evaluate for infectious or metabolic etiology of disease.  *** ***Urinalysis with reflex culture ordered to evaluate for UTI or relevant urologic/nephrologic pathology.  *** to evaluate for structural/infectious intra-*** pathology.  {crccardiactesting:32591::EKG to evaluate for cardiac pathology} Objective evaluation as below reviewed   Labs: {LSLABS:33416}  Radiology: {LSRADS:33417} No results found.  EKG/Medicine tests: {LSEKG:33414} EKG Interpretation:    Decision rules:  {Document cardiac monitor, telemetry assessment procedure when appropriate:1} {   Click here for ABCD2, HEART and other calculatorsREFRESH Note before signing  :1}   {Document critical care time when appropriate:1} {Document review of labs and clinical decision tools ie heart score, Chads2Vasc2 etc:1}  {Document your independent review of radiology images, and any outside records:1} {Document your discussion with family members, caretakers, and with consultants:1} {Document social determinants of health affecting pt's care:1} {Document your decision making why or why not admission, treatments were needed:1}                Interventions:***   See the EMR for full details regarding lab and imaging results.  ***  {LSCOPA:33420}  Discussion of management or test interpretations with external provider(s): ***  Risk Drugs:{LSDRUGS:33399} Treatment: {LSTREATMENT:33409} Surgery:{LSSURGERY:33410} Critical Care: ***  Disposition: {LSDISPO:33388}  MDM generated using voice dictation software and may contain dictation errors.  Please contact me for any clarification or with any questions.  Clinical Impression: No diagnosis found.   Data Unavailable   Final Clinical Impression(s) / ED Diagnoses Final diagnoses:  None    Rx / DC Orders ED Discharge Orders  None

## 2024-02-11 LAB — I-STAT CHEM 8, ED
BUN: 48 mg/dL — ABNORMAL HIGH (ref 8–23)
Calcium, Ion: 1.19 mmol/L (ref 1.15–1.40)
Chloride: 95 mmol/L — ABNORMAL LOW (ref 98–111)
Creatinine, Ser: 7.3 mg/dL — ABNORMAL HIGH (ref 0.61–1.24)
Glucose, Bld: 118 mg/dL — ABNORMAL HIGH (ref 70–99)
HCT: 38 % — ABNORMAL LOW (ref 39.0–52.0)
Hemoglobin: 12.9 g/dL — ABNORMAL LOW (ref 13.0–17.0)
Potassium: 4.2 mmol/L (ref 3.5–5.1)
Sodium: 134 mmol/L — ABNORMAL LOW (ref 135–145)
TCO2: 25 mmol/L (ref 22–32)

## 2024-02-11 LAB — BLOOD GAS, VENOUS
Acid-Base Excess: 5.2 mmol/L — ABNORMAL HIGH (ref 0.0–2.0)
Bicarbonate: 29 mmol/L — ABNORMAL HIGH (ref 20.0–28.0)
O2 Saturation: 92.4 %
Patient temperature: 37
pCO2, Ven: 39 mmHg — ABNORMAL LOW (ref 44–60)
pH, Ven: 7.48 — ABNORMAL HIGH (ref 7.25–7.43)
pO2, Ven: 63 mmHg — ABNORMAL HIGH (ref 32–45)

## 2024-02-11 NOTE — Discharge Instructions (Signed)
 Daniel Solomon.  Thank you for allowing us  to take care of you today.  You came to the Emergency Department today because you passed out after your dialysis session this evening.  Here in the emergency department you were feeling better after getting a little bit of fluid with EMS.  Here in the emergency department your vitals were normal, your blood pressure and heart rate did not change significantly with standing, your oxygen is normal, you were able to walk without feeling lightheaded or passing out.  Your labs were reassuring, including your calcium  (specifically your ionized calcium , which is the type of calcium  that your body uses, is normal).  It is unclear what precisely caused your episode, however given you are feeling well, your vitals are normal and your labs are reassuring, you are safe to go home.  The most likely cause is the increased fluid shifts from having an extra 1.5 kg of fluid taken off today to account for your missed day yesterday from the machine malfunction.  Your workup today indicates that your volume status is well-balanced right now, therefore you should not increase your fluid intake at home, but continue to do normal fluid intake.   To-Do: 1. Please follow-up with your primary doctor within 1 - 2 weeks / as soon as possible.   Please return to the Emergency Department or call 911 if you experience have worsening of your symptoms, or do not get better, chest pain, shortness of breath, severe or significantly worsening pain, high fever, severe confusion, pass out or have any reason to think that you need emergency medical care.   We hope you feel better soon.   Mitzie Later, MD Department of Emergency Medicine [facility] [place]

## 2024-02-11 NOTE — ED Notes (Signed)
 Patient ambulated 3x down hallway; Sat remained at 100%

## 2024-02-11 NOTE — ED Notes (Signed)
 Ambulated pt. 02 sat remained 100% RA

## 2024-03-13 ENCOUNTER — Emergency Department (HOSPITAL_COMMUNITY)
Admission: EM | Admit: 2024-03-13 | Discharge: 2024-03-13 | Disposition: A | Discharge status: Home / Self Care | Source: Home / Self Care | Attending: Emergency Medicine | Admitting: Emergency Medicine

## 2024-03-13 ENCOUNTER — Emergency Department (HOSPITAL_COMMUNITY)

## 2024-03-13 ENCOUNTER — Telehealth (HOSPITAL_COMMUNITY): Payer: Self-pay | Admitting: Emergency Medicine

## 2024-03-13 ENCOUNTER — Other Ambulatory Visit: Payer: Self-pay

## 2024-03-13 ENCOUNTER — Encounter (HOSPITAL_COMMUNITY): Payer: Self-pay

## 2024-03-13 DIAGNOSIS — X58XXXA Exposure to other specified factors, initial encounter: Secondary | ICD-10-CM | POA: Diagnosis not present

## 2024-03-13 DIAGNOSIS — R569 Unspecified convulsions: Secondary | ICD-10-CM | POA: Insufficient documentation

## 2024-03-13 DIAGNOSIS — N186 End stage renal disease: Secondary | ICD-10-CM | POA: Insufficient documentation

## 2024-03-13 DIAGNOSIS — Z7982 Long term (current) use of aspirin: Secondary | ICD-10-CM | POA: Insufficient documentation

## 2024-03-13 DIAGNOSIS — Z992 Dependence on renal dialysis: Secondary | ICD-10-CM | POA: Diagnosis not present

## 2024-03-13 DIAGNOSIS — S01552A Open bite of oral cavity, initial encounter: Secondary | ICD-10-CM | POA: Insufficient documentation

## 2024-03-13 LAB — COMPREHENSIVE METABOLIC PANEL WITH GFR
ALT: 19 U/L (ref 0–44)
AST: 16 U/L (ref 15–41)
Albumin: 4.6 g/dL (ref 3.5–5.0)
Alkaline Phosphatase: 149 U/L — ABNORMAL HIGH (ref 38–126)
Anion gap: 21 — ABNORMAL HIGH (ref 5–15)
BUN: 60 mg/dL — ABNORMAL HIGH (ref 8–23)
CO2: 24 mmol/L (ref 22–32)
Calcium: 8.4 mg/dL — ABNORMAL LOW (ref 8.9–10.3)
Chloride: 93 mmol/L — ABNORMAL LOW (ref 98–111)
Creatinine, Ser: 8.64 mg/dL — ABNORMAL HIGH (ref 0.61–1.24)
GFR, Estimated: 6 mL/min — ABNORMAL LOW
Glucose, Bld: 103 mg/dL — ABNORMAL HIGH (ref 70–99)
Potassium: 4.3 mmol/L (ref 3.5–5.1)
Sodium: 138 mmol/L (ref 135–145)
Total Bilirubin: 0.4 mg/dL (ref 0.0–1.2)
Total Protein: 7.4 g/dL (ref 6.5–8.1)

## 2024-03-13 LAB — CBC WITH DIFFERENTIAL/PLATELET
Abs Immature Granulocytes: 0.04 K/uL (ref 0.00–0.07)
Basophils Absolute: 0 K/uL (ref 0.0–0.1)
Basophils Relative: 0 %
Eosinophils Absolute: 0.1 K/uL (ref 0.0–0.5)
Eosinophils Relative: 1 %
HCT: 35.9 % — ABNORMAL LOW (ref 39.0–52.0)
Hemoglobin: 12.1 g/dL — ABNORMAL LOW (ref 13.0–17.0)
Immature Granulocytes: 1 %
Lymphocytes Relative: 10 %
Lymphs Abs: 0.8 K/uL (ref 0.7–4.0)
MCH: 35.2 pg — ABNORMAL HIGH (ref 26.0–34.0)
MCHC: 33.7 g/dL (ref 30.0–36.0)
MCV: 104.4 fL — ABNORMAL HIGH (ref 80.0–100.0)
Monocytes Absolute: 0.5 K/uL (ref 0.1–1.0)
Monocytes Relative: 6 %
Neutro Abs: 6.6 K/uL (ref 1.7–7.7)
Neutrophils Relative %: 82 %
Platelets: 121 K/uL — ABNORMAL LOW (ref 150–400)
RBC: 3.44 MIL/uL — ABNORMAL LOW (ref 4.22–5.81)
RDW: 14.3 % (ref 11.5–15.5)
WBC: 8.1 K/uL (ref 4.0–10.5)
nRBC: 0 % (ref 0.0–0.2)

## 2024-03-13 LAB — CBG MONITORING, ED: Glucose-Capillary: 103 mg/dL — ABNORMAL HIGH (ref 70–99)

## 2024-03-13 MED ORDER — LEVETIRACETAM 500 MG PO TABS: ORAL_TABLET | ORAL | 1 refills | Status: AC

## 2024-03-13 MED ORDER — LEVETIRACETAM 500 MG PO TABS: ORAL_TABLET | ORAL | 1 refills | Status: DC

## 2024-03-13 MED ORDER — LEVETIRACETAM (KEPPRA) 500 MG/5 ML ADULT IV PUSH
2000.0000 mg | Freq: Once | INTRAVENOUS | Status: AC
  Administered 2024-03-13: 2000 mg via INTRAVENOUS
  Filled 2024-03-13: qty 20

## 2024-03-13 MED ORDER — LEVETIRACETAM 500 MG PO TABS: 500.0000 mg | ORAL_TABLET | Freq: Two times a day (BID) | ORAL | 1 refills | Status: DC

## 2024-03-13 NOTE — Discharge Instructions (Addendum)
Seizure precautions: °Per [place] DMV statutes, patients with seizures are not allowed to drive until they have been seizure-free for six months and cleared by a physician  °  °Use caution when using heavy equipment or power tools. Avoid working on ladders or at heights. Take showers instead of baths. Ensure the water temperature is not too high on the home water heater. Do not go swimming alone. Do not lock yourself in a room alone (i.e. bathroom). When caring for infants or small children, sit down when holding, feeding, or changing them to minimize risk of injury to the child in the event you have a seizure. Maintain good sleep hygiene. Avoid alcohol.  °  °If patient has another seizure, call 911 and bring them back to the ED if: °A.  The seizure lasts longer than 5 minutes.      °B.  The patient doesn't wake shortly after the seizure or has new problems such as difficulty seeing, speaking or moving following the seizure °C.  The patient was injured during the seizure °D.  The patient has a temperature over 102 F (39C) °E.  The patient vomited during the seizure and now is having trouble breathing °   °During the Seizure °  °- First, ensure adequate ventilation and place patients on the floor on their left side  °Loosen clothing around the neck and ensure the airway is patent. If the patient is clenching the teeth, do not force the mouth open with any object as this can cause severe damage °- Remove all items from the surrounding that can be hazardous. The patient may be oblivious to what's happening and may not even know what he or she is doing. °If the patient is confused and wandering, either gently guide him/her away and block access to outside areas °- Reassure the individual and be comforting °- Call 911. In most cases, the seizure ends before EMS arrives. However, there are cases when seizures may last over 3 to 5 minutes. Or the individual may have developed breathing difficulties or severe  injuries. If a pregnant patient or a person with diabetes develops a seizure, it is prudent to call an ambulance. °- Finally, if the patient does not regain full consciousness, then call EMS. Most patients will remain confused for about 45 to 90 minutes after a seizure, so you must use judgment in calling for help. °- Avoid restraints but make sure the patient is in a bed with padded side rails °- Place the individual in a lateral position with the neck slightly flexed; this will help the saliva drain from the mouth and prevent the tongue from falling backward °- Remove all nearby furniture and other hazards from the area °- Provide verbal assurance as the individual is regaining consciousness °- Provide the patient with privacy if possible °- Call for help and start treatment as ordered by the caregiver °  ° After the Seizure (Postictal Stage) °  °After a seizure, most patients experience confusion, fatigue, muscle pain and/or a headache. Thus, one should permit the individual to sleep. For the next few days, reassurance is essential. Being calm and helping reorient the person is also of importance. °  °Most seizures are painless and end spontaneously. Seizures are not harmful to others but can lead to complications such as stress on the lungs, brain and the heart. Individuals with prior lung problems may develop labored breathing and respiratory distress.  °  °

## 2024-03-13 NOTE — ED Notes (Signed)
 Pt was given a bag sandwich and drink for nourishment.

## 2024-03-13 NOTE — Telephone Encounter (Signed)
 Updated Keppra  prescription.  Patient needs to take 500 mg of Keppra  daily and then on dialysis days take an additional 500 mg after dialysis.

## 2024-03-13 NOTE — ED Triage Notes (Signed)
 [facility] reports pt coming from home. Family reports pt walked up to her and was not able to get his words out and then suddenly fell to the ground and started having seizure like activity lasting app 40 seconds. [facility] states pt was postictal and right side weakness and slurred speech. Upon EMS arrival no weakness and no seizures, did have some slurred speech but did resolve. Incontinent of bowels upon EMS arrival. Pt does dialysis at home M/W/F.

## 2024-03-13 NOTE — ED Provider Notes (Signed)
 " [place] EMERGENCY DEPARTMENT AT [facility] Provider Note   CSN: [id number] Arrival date & time: 03/13/24  1624     Patient presents with: Seizures   Daniel Solomon. is a 64 y.o. male.   Patient arrives after seizure-like activity.  Patient has history of A-fib not on anticoagulation, end-stage renal disease on hemodialysis, subdural hematoma history.  Patient sounds like at around 245 he started to have some difficulty with his speech unable to get words out then had shaking type episode that lasted several seconds he bit his tongue he was confused afterwards maybe had right-sided weakness but now resolved.  He has never had an episode like this before.  He denies any trauma.  He is not on any blood thinners.  Has never been on seizure medicines except for when he had a subdural.  Does not think he is ever had an event like this before.  No chest pain or shortness of breath.  No weakness numbness tingling.  The history is provided by the patient and the EMS personnel.       Prior to Admission medications  Medication Sig Start Date End Date Taking? Authorizing Provider  levETIRAcetam  (KEPPRA ) 500 MG tablet Take 1 tablet (500 mg total) by mouth 2 (two) times daily. 03/13/24 05/12/24 Yes Landree Fernholz, DO  acetaminophen  (TYLENOL ) 500 MG tablet Take 1,000 mg by mouth every 6 (six) hours as needed for mild pain (pain score 1-3) or moderate pain (pain score 4-6).    [provider]  allopurinol  (ZYLOPRIM ) 300 MG tablet TAKE 1 TABLET BY MOUTH EVERY DAY 01/04/22   Katrinka Garnette KIDD, MD  amoxicillin  (AMOXIL ) 500 MG tablet Take FOUR tablets (2000 mg) by mouth ONE HOUR BEFORE ANY DENTAL APPOINTMENT (including cleanings) Patient not taking: Reported on 01/30/2024 02/02/23   Verlin Lonni BIRCH, MD  aspirin  EC 81 MG tablet Take 1 tablet (81 mg total) by mouth daily. Swallow whole. 06/05/23   Verlin Lonni BIRCH, MD  atorvastatin  (LIPITOR) 40 MG tablet Take 1 tablet (40 mg  total) by mouth daily. 12/11/23   Verlin Lonni BIRCH, MD  AURYXIA  1 GM 210 MG(Fe) tablet Take 210 mg by mouth 3 (three) times daily with meals. 12/23/20   [provider]  HYDROcodone -acetaminophen  (NORCO/VICODIN) 5-325 MG tablet Take 1 tablet by mouth every 6 (six) hours as needed for moderate pain (pain score 4-6). 02/01/24   Bethanie Cough, PA-C  Methoxy PEG-Epoetin  Beta (MIRCERA IJ) Inject into the skin as needed. 06/15/20   [provider]  NEXIUM 20 MG capsule Take 20 mg by mouth daily. 08/18/23   [provider]  NON FORMULARY Pt uses a cpap nightly    [provider]  sildenafil (VIAGRA) 100 MG tablet Take 100 mg by mouth daily as needed. 01/16/24   [provider]  Tenapanor  HCl, CKD, (XPHOZAH ) 30 MG TABS Take 30 mg by mouth daily.    [provider]    Allergies: Patient has no known allergies.    Review of Systems  Updated Vital Signs BP 99/76 (BP Location: Right Arm)   Pulse 70   Temp 97.9 F (36.6 C) (Oral)   Resp 16   SpO2 100%   Physical Exam Vitals and nursing note reviewed.  Constitutional:      General: He is not in acute distress.    Appearance: He is well-developed. He is not ill-appearing.  HENT:     Head: Normocephalic and atraumatic.  Comments: Bite marks to the tongue    Nose: Nose normal.     Mouth/Throat:     Mouth: Mucous membranes are moist.  Eyes:     Extraocular Movements: Extraocular movements intact.     Conjunctiva/sclera: Conjunctivae normal.     Pupils: Pupils are equal, round, and reactive to light.  Cardiovascular:     Rate and Rhythm: Normal rate and regular rhythm.     Pulses: Normal pulses.     Heart sounds: Normal heart sounds. No murmur heard. Pulmonary:     Effort: Pulmonary effort is normal. No respiratory distress.     Breath sounds: Normal breath sounds.  Abdominal:     General: Abdomen is flat.     Palpations: Abdomen is soft.     Tenderness: There is no abdominal  tenderness.  Musculoskeletal:        General: No swelling.     Cervical back: Normal range of motion and neck supple.  Skin:    General: Skin is warm and dry.     Capillary Refill: Capillary refill takes less than 2 seconds.  Neurological:     General: No focal deficit present.     Mental Status: He is alert and oriented to person, place, and time.     Cranial Nerves: No cranial nerve deficit.     Sensory: No sensory deficit.     Motor: No weakness.     Coordination: Coordination normal.     Comments: Normal strength and sensation normal speech normal visual fields normal cranial nerves  Psychiatric:        Mood and Affect: Mood normal.     (all labs ordered are listed, but only abnormal results are displayed) Labs Reviewed  CBC WITH DIFFERENTIAL/PLATELET - Abnormal; Notable for the following components:      Result Value   RBC 3.44 (*)    Hemoglobin 12.1 (*)    HCT 35.9 (*)    MCV 104.4 (*)    MCH 35.2 (*)    Platelets 121 (*)    All other components within normal limits  COMPREHENSIVE METABOLIC PANEL WITH GFR - Abnormal; Notable for the following components:   Chloride 93 (*)    Glucose, Bld 103 (*)    BUN 60 (*)    Creatinine, Ser 8.64 (*)    Calcium  8.4 (*)    Alkaline Phosphatase 149 (*)    GFR, Estimated 6 (*)    Anion gap 21 (*)    All other components within normal limits  CBG MONITORING, ED - Abnormal; Notable for the following components:   Glucose-Capillary 103 (*)    All other components within normal limits    EKG: EKG Interpretation Date/Time:  Wednesday March 13 2024 17:05:16 EST Ventricular Rate:  78 PR Interval:    QRS Duration:  95 QT Interval:  415 QTC Calculation: 473 R Axis:   38  Text Interpretation: Atrial fibrillation Confirmed by Ruthe Cornet [phone]) on 03/13/2024 5:30:54 PM  Radiology: CT Head Wo Contrast Result Date: 03/13/2024 EXAM: CT HEAD WITHOUT CONTRAST 03/13/2024 05:16:47 PM TECHNIQUE: CT of the head was performed without  the administration of intravenous contrast. Automated exposure control, iterative reconstruction, and/or weight based adjustment of the mA/kV was utilized to reduce the radiation dose to as low as reasonably achievable. COMPARISON: None available. CLINICAL HISTORY: Headache, increasing frequency or severity. Abnormal speech. FINDINGS: BRAIN AND VENTRICLES: No acute hemorrhage. No evidence of acute infarct. No hydrocephalus. No extra-axial collection. No mass effect or  midline shift. Chronic left sided dural thickening subjacent to the craniotomy is stable. Punctate calcification in the left frontal pericallosal region is again noted. Intracranial white matter changes are moderately advanced for age, stable. 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 Atherosclerotic calcifications are present in the cavernous carotid arteries bilaterally and at the dural margin of both vertebral arteries. No hyperdense vessel is present. ORBITS: No acute abnormality. SINUSES: No acute abnormality. Or SOFT TISSUES AND SKULL: No acute soft tissue abnormality. No skull fracture. IMPRESSION: 1. No acute intracranial abnormality or significant interval change. Electronically signed by: Lonni Necessary MD 03/13/2024 05:24 PM EST RP Workstation: [id number]     Procedures   Medications Ordered in the ED  levETIRAcetam  (KEPPRA ) undiluted injection 2,000 mg (2,000 mg Intravenous Given 03/13/24 1724)                                    Medical Decision Making Amount and/or Complexity of Data Reviewed Labs: ordered. Radiology: ordered.  Risk Prescription drug management.   Alm MARLA Viktoria Mickey. is here after seizure activity.  Normal vitals.  No fever.  History of subdural, end-stage renal disease on hemodialysis, A-fib not on anticoagulation.  Patient had episode of difficulty with speech aphasia seizure activity and then after seizure activity where he bit his tongue and had post ictal state had  some transient right-sided weakness now resolved.  Talked with Dr. Vanessa neurology given that he has had previous left subdural the symptoms are consistent with seizure activity.  Will load him with Keppra  get basic labs get a head CT.  He is neurologically intact on exam.  Well-appearing otherwise.  Will get EKG basic labs head CT.  Overall lab work showed no significant leukocytosis anemia or electrolyte abnormality.  Creatinine at baseline.  CT scan unremarkable.  Overall patient responded well to IV Keppra .  He has not had any other seizure episodes.  He is fully back to his baseline is able to eat and drink without any issues.  He will continue Keppra  twice daily outpatient and follow-up with neurology.  Seizure precautions given both verbally and in discharge instructions.  Understands return precautions.  Discharge.  No concern for stroke or any other acute process.  This chart was dictated using voice recognition software.  Despite best efforts to proofread,  errors can occur which can change the documentation meaning.      Final diagnoses:  Seizure-like activity [facility])    ED Discharge Orders          Ordered    levETIRAcetam  (KEPPRA ) 500 MG tablet  2 times daily        03/13/24 2137    Ambulatory referral to Neurology       Comments: An appointment is requested in approximately: 1 week   03/13/24 2137               Ruthe Cornet, DO 03/13/24 2138  "

## 2024-03-14 ENCOUNTER — Encounter: Payer: Self-pay | Admitting: Neurology

## 2024-03-18 NOTE — Progress Notes (Unsigned)
 "   Postoperative Access Visit   History of Present Illness   Daniel Palin. is a 64 y.o. year old male who presents for postoperative follow-up for: Left arm arteriovenous fistula revision with sidebranch ligation x 2 on 02/01/24 by Dr. Magda. This was performed secondary to poorly maturing left AV fistula. His initial BC AV fistula was created on 08/02/23.  The patient's wounds are well healed.  The patient notes no steal symptoms.  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 He is hopefull to do at home HD. He is followed by Dr. Marlee.   Physical Examination   Vitals:   03/19/24 1411  BP: 111/72  Weight: 196 lb 11.2 oz (89.2 kg)  Height: 5' 8 (1.727 m)   Body mass index is 29.91 kg/m.  left arm Incision is well healed, 2+ radial pulse, hand grip is 5/5, sensation in digits is intact, palpable thrill, bruit can  be auscultated     Non Invasive Vascular lab evaluation   VAS US  Duplex Dialysis Access (AVF, AVG): Findings:  +--------------------+----------+-----------------+--------+  AVF                PSV (cm/s)Flow Vol (mL/min)Comments  +--------------------+----------+-----------------+--------+  Native artery inflow   112           902                 +--------------------+----------+-----------------+--------+  AVF Anastomosis        223                               +--------------------+----------+-----------------+--------+   +------------+----------+-------------+------------+------------------+  OUTFLOW VEINPSV (cm/s)Diameter (cm) Depth (cm)      Describe       +------------+----------+-------------+------------+------------------+  Prox UA         96        0.67         0.88                        +------------+----------+-------------+------------+------------------+  Mid UA          74        0.64         0.31                        +------------+----------+-------------+------------+------------------+   Dist UA         87        0.79         0.25                        +------------+----------+-------------+------------+------------------+  AC Fossa    750 / 523  0.89 / 0.21 0.30 / 0..43change in Diameter  +------------+----------+-------------+------------+------------------+   Summary:  Patent AVF with dilitation in the outflow vein at the antecubital fossa.   Medical Decision Making   Daniel Tully. is a 64 y.o. year old male who presents s/p Left arm arteriovenous fistula revision with sidebranch ligation x 2 on 02/01/24 by Dr. Magda. This was performed secondary to poorly maturing left AV fistula. His initial BC AV fistula was created on 08/02/23. His incisions are well healed. Duplex shows that the fistula is patent, has adequate volume flow and has now matured well.  Patent is without signs or symptoms of steal syndrome The patient's access will be ready for  use immediately. He is in the process of getting training for home HD but fistula can be used as soon as training is complete The patient's tunneled dialysis catheter can be removed when Nephrology is comfortable with the performance of the left brachiocephalic AV fistula The patient may follow up on a prn basis   Daniel Damme, PA-C [facility] Office: [phone]  Clinic MD: Sheree "

## 2024-03-19 ENCOUNTER — Encounter: Payer: Self-pay | Admitting: Physician Assistant

## 2024-03-19 ENCOUNTER — Ambulatory Visit (INDEPENDENT_AMBULATORY_CARE_PROVIDER_SITE_OTHER): Admitting: Physician Assistant

## 2024-03-19 ENCOUNTER — Ambulatory Visit (HOSPITAL_COMMUNITY)
Admission: RE | Admit: 2024-03-19 | Discharge: 2024-03-19 | Disposition: A | Discharge status: Home / Self Care | Source: Ambulatory Visit | Attending: Vascular Surgery | Admitting: Vascular Surgery

## 2024-03-19 VITALS — BP 111/72 | Ht 68.0 in | Wt 196.7 lb

## 2024-03-19 DIAGNOSIS — N186 End stage renal disease: Secondary | ICD-10-CM

## 2024-04-01 ENCOUNTER — Encounter: Payer: Self-pay | Admitting: Gastroenterology

## 2024-04-01 ENCOUNTER — Other Ambulatory Visit: Payer: Self-pay | Admitting: Family Medicine

## 2024-04-03 ENCOUNTER — Encounter: Payer: Self-pay | Admitting: Neurology

## 2024-04-03 ENCOUNTER — Ambulatory Visit: Payer: Self-pay | Admitting: Neurology

## 2024-04-03 VITALS — BP 112/65 | HR 73 | Ht 68.0 in | Wt 199.0 lb

## 2024-04-03 DIAGNOSIS — Z8679 Personal history of other diseases of the circulatory system: Secondary | ICD-10-CM

## 2024-04-03 DIAGNOSIS — G40209 Localization-related (focal) (partial) symptomatic epilepsy and epileptic syndromes with complex partial seizures, not intractable, without status epilepticus: Secondary | ICD-10-CM

## 2024-04-03 NOTE — Patient Instructions (Addendum)
 1. Continue LEVETIRACETAM  500MG  DAILY AND ADDITIONAL 500MG  AFTER DIALYSIS.  Take it exactly as directed.  Do not stop taking the medicine without talking to your doctor first, even if you have not had a seizure in a long time.   2. Avoid activities in which a seizure would cause danger to yourself or to others.  Don't operate dangerous machinery, swim alone, or climb in high or dangerous places, such as on ladders, roofs, or girders.  Do not drive unless your doctor says you may.  3. If you have any warning that you may have a seizure, lay down in a safe place where you can't hurt yourself.    4.  No driving for 6 months from last seizure, as per [place]  state law.   Please refer to the following link on the [employer]'s website for more information: http://www.epilepsyfoundation.org/answerplace/Social/driving/drivingu.cfm   5.  Maintain good sleep hygiene.  6.  Notify your neurology if you are planning pregnancy or if you become pregnant.  7.  Contact your doctor if you have any problems that may be related to the medicine you are taking.  8.  Call 911 and bring the patient back to the ED if:        A.  The seizure lasts longer than 5 minutes.       B.  The patient doesn't awaken shortly after the seizure  C.  The patient has new problems such as difficulty seeing, speaking or moving  D.  The patient was injured during the seizure  E.  The patient has a temperature over 102 F (39C)  F.  The patient vomited and now is having trouble breathing

## 2024-07-30 ENCOUNTER — Ambulatory Visit

## 2024-08-01 ENCOUNTER — Ambulatory Visit: Payer: Self-pay | Admitting: Neurology

## 2025-01-31 ENCOUNTER — Encounter: Admitting: Family Medicine
